# Patient Record
Sex: Female | Born: 1935 | Race: White | Hispanic: No | Marital: Married | Smoking: Never smoker
Health system: Southern US, Community
[De-identification: ages and names within clinical notes are randomized; demographics above are authoritative.]

## PROBLEM LIST (undated history)

## (undated) DIAGNOSIS — E669 Obesity, unspecified: Secondary | ICD-10-CM

## (undated) DIAGNOSIS — E039 Hypothyroidism, unspecified: Secondary | ICD-10-CM

## (undated) DIAGNOSIS — I2510 Atherosclerotic heart disease of native coronary artery without angina pectoris: Secondary | ICD-10-CM

## (undated) DIAGNOSIS — E7800 Pure hypercholesterolemia, unspecified: Secondary | ICD-10-CM

## (undated) DIAGNOSIS — C55 Malignant neoplasm of uterus, part unspecified: Secondary | ICD-10-CM

## (undated) DIAGNOSIS — I10 Essential (primary) hypertension: Secondary | ICD-10-CM

## (undated) DIAGNOSIS — K219 Gastro-esophageal reflux disease without esophagitis: Secondary | ICD-10-CM

## (undated) DIAGNOSIS — M1990 Unspecified osteoarthritis, unspecified site: Secondary | ICD-10-CM

## (undated) DIAGNOSIS — J90 Pleural effusion, not elsewhere classified: Secondary | ICD-10-CM

## (undated) DIAGNOSIS — K449 Diaphragmatic hernia without obstruction or gangrene: Secondary | ICD-10-CM

## (undated) DIAGNOSIS — D649 Anemia, unspecified: Secondary | ICD-10-CM

## (undated) DIAGNOSIS — E785 Hyperlipidemia, unspecified: Secondary | ICD-10-CM

## (undated) DIAGNOSIS — I82409 Acute embolism and thrombosis of unspecified deep veins of unspecified lower extremity: Secondary | ICD-10-CM

## (undated) HISTORY — DX: Anemia, unspecified: D64.9

## (undated) HISTORY — DX: Pure hypercholesterolemia, unspecified: E78.00

## (undated) HISTORY — DX: Gastro-esophageal reflux disease without esophagitis: K21.9

## (undated) HISTORY — PX: CATARACT EXTRACTION, BILATERAL: SHX1313

## (undated) HISTORY — PX: JOINT REPLACEMENT: SHX530

## (undated) HISTORY — DX: Acute embolism and thrombosis of unspecified deep veins of unspecified lower extremity: I82.409

## (undated) HISTORY — PX: EYE SURGERY: SHX253

## (undated) HISTORY — DX: Hyperlipidemia, unspecified: E78.5

## (undated) HISTORY — DX: Hypothyroidism, unspecified: E03.9

## (undated) HISTORY — DX: Unspecified osteoarthritis, unspecified site: M19.90

## (undated) HISTORY — PX: ABDOMINAL HYSTERECTOMY: SHX81

## (undated) HISTORY — DX: Malignant neoplasm of uterus, part unspecified: C55

## (undated) HISTORY — DX: Diaphragmatic hernia without obstruction or gangrene: K44.9

## (undated) HISTORY — PX: OTHER SURGICAL HISTORY: SHX169

## (undated) HISTORY — DX: Obesity, unspecified: E66.9

## (undated) HISTORY — PX: CORONARY ARTERY BYPASS GRAFT: SHX141

## (undated) HISTORY — DX: Atherosclerotic heart disease of native coronary artery without angina pectoris: I25.10

## (undated) HISTORY — DX: Pleural effusion, not elsewhere classified: J90

## (undated) HISTORY — DX: Essential (primary) hypertension: I10

---

## 1997-05-23 ENCOUNTER — Other Ambulatory Visit: Admission: RE | Admit: 1997-05-23 | Discharge: 1997-05-23 | Payer: Self-pay | Admitting: Obstetrics and Gynecology

## 1997-07-05 ENCOUNTER — Encounter: Admission: RE | Admit: 1997-07-05 | Discharge: 1997-10-03 | Payer: Self-pay | Admitting: Internal Medicine

## 1997-10-18 ENCOUNTER — Ambulatory Visit (HOSPITAL_BASED_OUTPATIENT_CLINIC_OR_DEPARTMENT_OTHER): Admission: RE | Admit: 1997-10-18 | Discharge: 1997-10-18 | Payer: Self-pay | Admitting: *Deleted

## 1998-01-07 HISTORY — PX: OTHER SURGICAL HISTORY: SHX169

## 1998-11-21 ENCOUNTER — Ambulatory Visit (HOSPITAL_COMMUNITY): Admission: RE | Admit: 1998-11-21 | Discharge: 1998-11-21 | Payer: Self-pay | Admitting: Obstetrics and Gynecology

## 1998-11-21 ENCOUNTER — Encounter (INDEPENDENT_AMBULATORY_CARE_PROVIDER_SITE_OTHER): Payer: Self-pay | Admitting: Specialist

## 1998-12-08 ENCOUNTER — Encounter: Payer: Self-pay | Admitting: Gynecology

## 1998-12-12 ENCOUNTER — Encounter (INDEPENDENT_AMBULATORY_CARE_PROVIDER_SITE_OTHER): Payer: Self-pay

## 1998-12-12 ENCOUNTER — Inpatient Hospital Stay (HOSPITAL_COMMUNITY): Admission: RE | Admit: 1998-12-12 | Discharge: 1998-12-15 | Payer: Self-pay | Admitting: Gynecology

## 1999-08-24 ENCOUNTER — Other Ambulatory Visit: Admission: RE | Admit: 1999-08-24 | Discharge: 1999-08-24 | Payer: Self-pay | Admitting: Gynecology

## 2000-01-04 ENCOUNTER — Other Ambulatory Visit: Admission: RE | Admit: 2000-01-04 | Discharge: 2000-01-04 | Payer: Self-pay | Admitting: Gynecology

## 2000-02-19 ENCOUNTER — Other Ambulatory Visit: Admission: RE | Admit: 2000-02-19 | Discharge: 2000-02-19 | Payer: Self-pay | Admitting: Gynecology

## 2000-05-13 ENCOUNTER — Other Ambulatory Visit: Admission: RE | Admit: 2000-05-13 | Discharge: 2000-05-13 | Payer: Self-pay | Admitting: Gynecology

## 2000-06-13 ENCOUNTER — Encounter: Payer: Self-pay | Admitting: Gynecology

## 2000-06-13 ENCOUNTER — Encounter: Admission: RE | Admit: 2000-06-13 | Discharge: 2000-06-13 | Payer: Self-pay | Admitting: Gynecology

## 2000-07-28 ENCOUNTER — Other Ambulatory Visit: Admission: RE | Admit: 2000-07-28 | Discharge: 2000-07-28 | Payer: Self-pay | Admitting: Gynecology

## 2000-10-21 ENCOUNTER — Other Ambulatory Visit: Admission: RE | Admit: 2000-10-21 | Discharge: 2000-10-21 | Payer: Self-pay | Admitting: Gynecology

## 2000-12-24 ENCOUNTER — Other Ambulatory Visit: Admission: RE | Admit: 2000-12-24 | Discharge: 2000-12-24 | Payer: Self-pay | Admitting: Gynecology

## 2001-02-16 ENCOUNTER — Ambulatory Visit (HOSPITAL_BASED_OUTPATIENT_CLINIC_OR_DEPARTMENT_OTHER): Admission: RE | Admit: 2001-02-16 | Discharge: 2001-02-16 | Payer: Self-pay | Admitting: Podiatry

## 2001-04-07 ENCOUNTER — Encounter (INDEPENDENT_AMBULATORY_CARE_PROVIDER_SITE_OTHER): Payer: Self-pay | Admitting: *Deleted

## 2001-04-07 ENCOUNTER — Ambulatory Visit (HOSPITAL_BASED_OUTPATIENT_CLINIC_OR_DEPARTMENT_OTHER): Admission: RE | Admit: 2001-04-07 | Discharge: 2001-04-07 | Payer: Self-pay | Admitting: Orthopedic Surgery

## 2001-04-13 ENCOUNTER — Other Ambulatory Visit: Admission: RE | Admit: 2001-04-13 | Discharge: 2001-04-13 | Payer: Self-pay | Admitting: Gynecology

## 2001-08-07 ENCOUNTER — Other Ambulatory Visit: Admission: RE | Admit: 2001-08-07 | Discharge: 2001-08-07 | Payer: Self-pay | Admitting: Gynecology

## 2001-12-11 ENCOUNTER — Other Ambulatory Visit: Admission: RE | Admit: 2001-12-11 | Discharge: 2001-12-11 | Payer: Self-pay | Admitting: Gynecology

## 2002-06-11 ENCOUNTER — Other Ambulatory Visit: Admission: RE | Admit: 2002-06-11 | Discharge: 2002-06-11 | Payer: Self-pay | Admitting: Gynecology

## 2002-12-10 ENCOUNTER — Other Ambulatory Visit: Admission: RE | Admit: 2002-12-10 | Discharge: 2002-12-10 | Payer: Self-pay | Admitting: Gynecology

## 2003-06-15 ENCOUNTER — Other Ambulatory Visit: Admission: RE | Admit: 2003-06-15 | Discharge: 2003-06-15 | Payer: Self-pay | Admitting: Gynecology

## 2003-12-15 ENCOUNTER — Other Ambulatory Visit: Admission: RE | Admit: 2003-12-15 | Discharge: 2003-12-15 | Payer: Self-pay | Admitting: Gynecology

## 2004-04-12 ENCOUNTER — Encounter: Admission: RE | Admit: 2004-04-12 | Discharge: 2004-04-12 | Payer: Self-pay | Admitting: Gynecology

## 2004-12-14 ENCOUNTER — Other Ambulatory Visit: Admission: RE | Admit: 2004-12-14 | Discharge: 2004-12-14 | Payer: Self-pay | Admitting: Gynecology

## 2005-08-15 ENCOUNTER — Encounter (INDEPENDENT_AMBULATORY_CARE_PROVIDER_SITE_OTHER): Payer: Self-pay | Admitting: Specialist

## 2005-08-15 ENCOUNTER — Ambulatory Visit (HOSPITAL_BASED_OUTPATIENT_CLINIC_OR_DEPARTMENT_OTHER): Admission: RE | Admit: 2005-08-15 | Discharge: 2005-08-15 | Payer: Self-pay | Admitting: Orthopedic Surgery

## 2005-10-09 ENCOUNTER — Ambulatory Visit (HOSPITAL_BASED_OUTPATIENT_CLINIC_OR_DEPARTMENT_OTHER): Admission: RE | Admit: 2005-10-09 | Discharge: 2005-10-09 | Payer: Self-pay | Admitting: Orthopedic Surgery

## 2005-12-24 ENCOUNTER — Other Ambulatory Visit: Admission: RE | Admit: 2005-12-24 | Discharge: 2005-12-24 | Payer: Self-pay | Admitting: Gynecology

## 2006-11-13 ENCOUNTER — Inpatient Hospital Stay (HOSPITAL_COMMUNITY): Admission: EM | Admit: 2006-11-13 | Discharge: 2006-11-22 | Payer: Self-pay | Admitting: Emergency Medicine

## 2006-11-13 ENCOUNTER — Encounter: Payer: Self-pay | Admitting: Surgery

## 2006-11-13 ENCOUNTER — Ambulatory Visit: Payer: Self-pay | Admitting: Cardiovascular Disease

## 2006-11-13 ENCOUNTER — Ambulatory Visit: Payer: Self-pay | Admitting: Surgery

## 2006-12-09 ENCOUNTER — Ambulatory Visit: Payer: Self-pay | Admitting: Cardiovascular Disease

## 2006-12-12 ENCOUNTER — Ambulatory Visit: Payer: Self-pay | Admitting: Cardiothoracic Surgery

## 2006-12-18 ENCOUNTER — Encounter (HOSPITAL_COMMUNITY): Admission: RE | Admit: 2006-12-18 | Discharge: 2007-01-07 | Payer: Self-pay | Admitting: Cardiovascular Disease

## 2006-12-30 ENCOUNTER — Other Ambulatory Visit: Admission: RE | Admit: 2006-12-30 | Discharge: 2006-12-30 | Payer: Self-pay | Admitting: Gynecology

## 2007-01-08 ENCOUNTER — Encounter (HOSPITAL_COMMUNITY): Admission: RE | Admit: 2007-01-08 | Discharge: 2007-02-20 | Payer: Self-pay | Admitting: Cardiovascular Disease

## 2007-03-31 ENCOUNTER — Ambulatory Visit: Payer: Self-pay | Admitting: Cardiovascular Disease

## 2007-04-03 ENCOUNTER — Ambulatory Visit: Payer: Self-pay | Admitting: Cardiology

## 2007-05-15 ENCOUNTER — Ambulatory Visit: Payer: Self-pay | Admitting: Cardiovascular Disease

## 2007-05-18 ENCOUNTER — Ambulatory Visit: Payer: Self-pay

## 2007-06-08 ENCOUNTER — Ambulatory Visit: Payer: Self-pay | Admitting: Cardiovascular Disease

## 2007-06-11 ENCOUNTER — Ambulatory Visit: Payer: Self-pay | Admitting: Internal Medicine

## 2007-12-22 ENCOUNTER — Ambulatory Visit (HOSPITAL_BASED_OUTPATIENT_CLINIC_OR_DEPARTMENT_OTHER): Admission: RE | Admit: 2007-12-22 | Discharge: 2007-12-22 | Payer: Self-pay | Admitting: Orthopedic Surgery

## 2007-12-22 ENCOUNTER — Encounter (INDEPENDENT_AMBULATORY_CARE_PROVIDER_SITE_OTHER): Payer: Self-pay | Admitting: Orthopedic Surgery

## 2007-12-29 ENCOUNTER — Ambulatory Visit: Payer: Self-pay | Admitting: Cardiovascular Disease

## 2008-07-02 DIAGNOSIS — E7800 Pure hypercholesterolemia, unspecified: Secondary | ICD-10-CM | POA: Insufficient documentation

## 2008-07-02 DIAGNOSIS — J90 Pleural effusion, not elsewhere classified: Secondary | ICD-10-CM | POA: Insufficient documentation

## 2008-07-02 DIAGNOSIS — K449 Diaphragmatic hernia without obstruction or gangrene: Secondary | ICD-10-CM | POA: Insufficient documentation

## 2008-07-02 DIAGNOSIS — I2510 Atherosclerotic heart disease of native coronary artery without angina pectoris: Secondary | ICD-10-CM | POA: Insufficient documentation

## 2008-07-02 DIAGNOSIS — I10 Essential (primary) hypertension: Secondary | ICD-10-CM | POA: Insufficient documentation

## 2008-07-02 DIAGNOSIS — D649 Anemia, unspecified: Secondary | ICD-10-CM | POA: Insufficient documentation

## 2008-07-02 DIAGNOSIS — Z8542 Personal history of malignant neoplasm of other parts of uterus: Secondary | ICD-10-CM

## 2008-07-02 DIAGNOSIS — E669 Obesity, unspecified: Secondary | ICD-10-CM | POA: Insufficient documentation

## 2008-07-02 DIAGNOSIS — E119 Type 2 diabetes mellitus without complications: Secondary | ICD-10-CM | POA: Insufficient documentation

## 2008-07-02 DIAGNOSIS — E039 Hypothyroidism, unspecified: Secondary | ICD-10-CM

## 2008-07-02 DIAGNOSIS — K219 Gastro-esophageal reflux disease without esophagitis: Secondary | ICD-10-CM | POA: Insufficient documentation

## 2008-07-02 DIAGNOSIS — E785 Hyperlipidemia, unspecified: Secondary | ICD-10-CM | POA: Insufficient documentation

## 2008-07-02 DIAGNOSIS — Z8540 Personal history of malignant neoplasm of unspecified female genital organ: Secondary | ICD-10-CM | POA: Insufficient documentation

## 2008-07-02 DIAGNOSIS — R079 Chest pain, unspecified: Secondary | ICD-10-CM

## 2008-07-07 ENCOUNTER — Ambulatory Visit: Payer: Self-pay | Admitting: Cardiovascular Disease

## 2008-07-15 DIAGNOSIS — M899 Disorder of bone, unspecified: Secondary | ICD-10-CM | POA: Insufficient documentation

## 2008-10-13 IMAGING — CR DG CHEST 2V
2 series · 2 of 2 positions shown · non-contrast
Comparison: 11/14/06.

CLINICAL DATA: Myocardial infarction.  Preop respiratory exam for coronary artery bypass grafting.
 CHEST ? 2 VIEW:

[w chest pa]
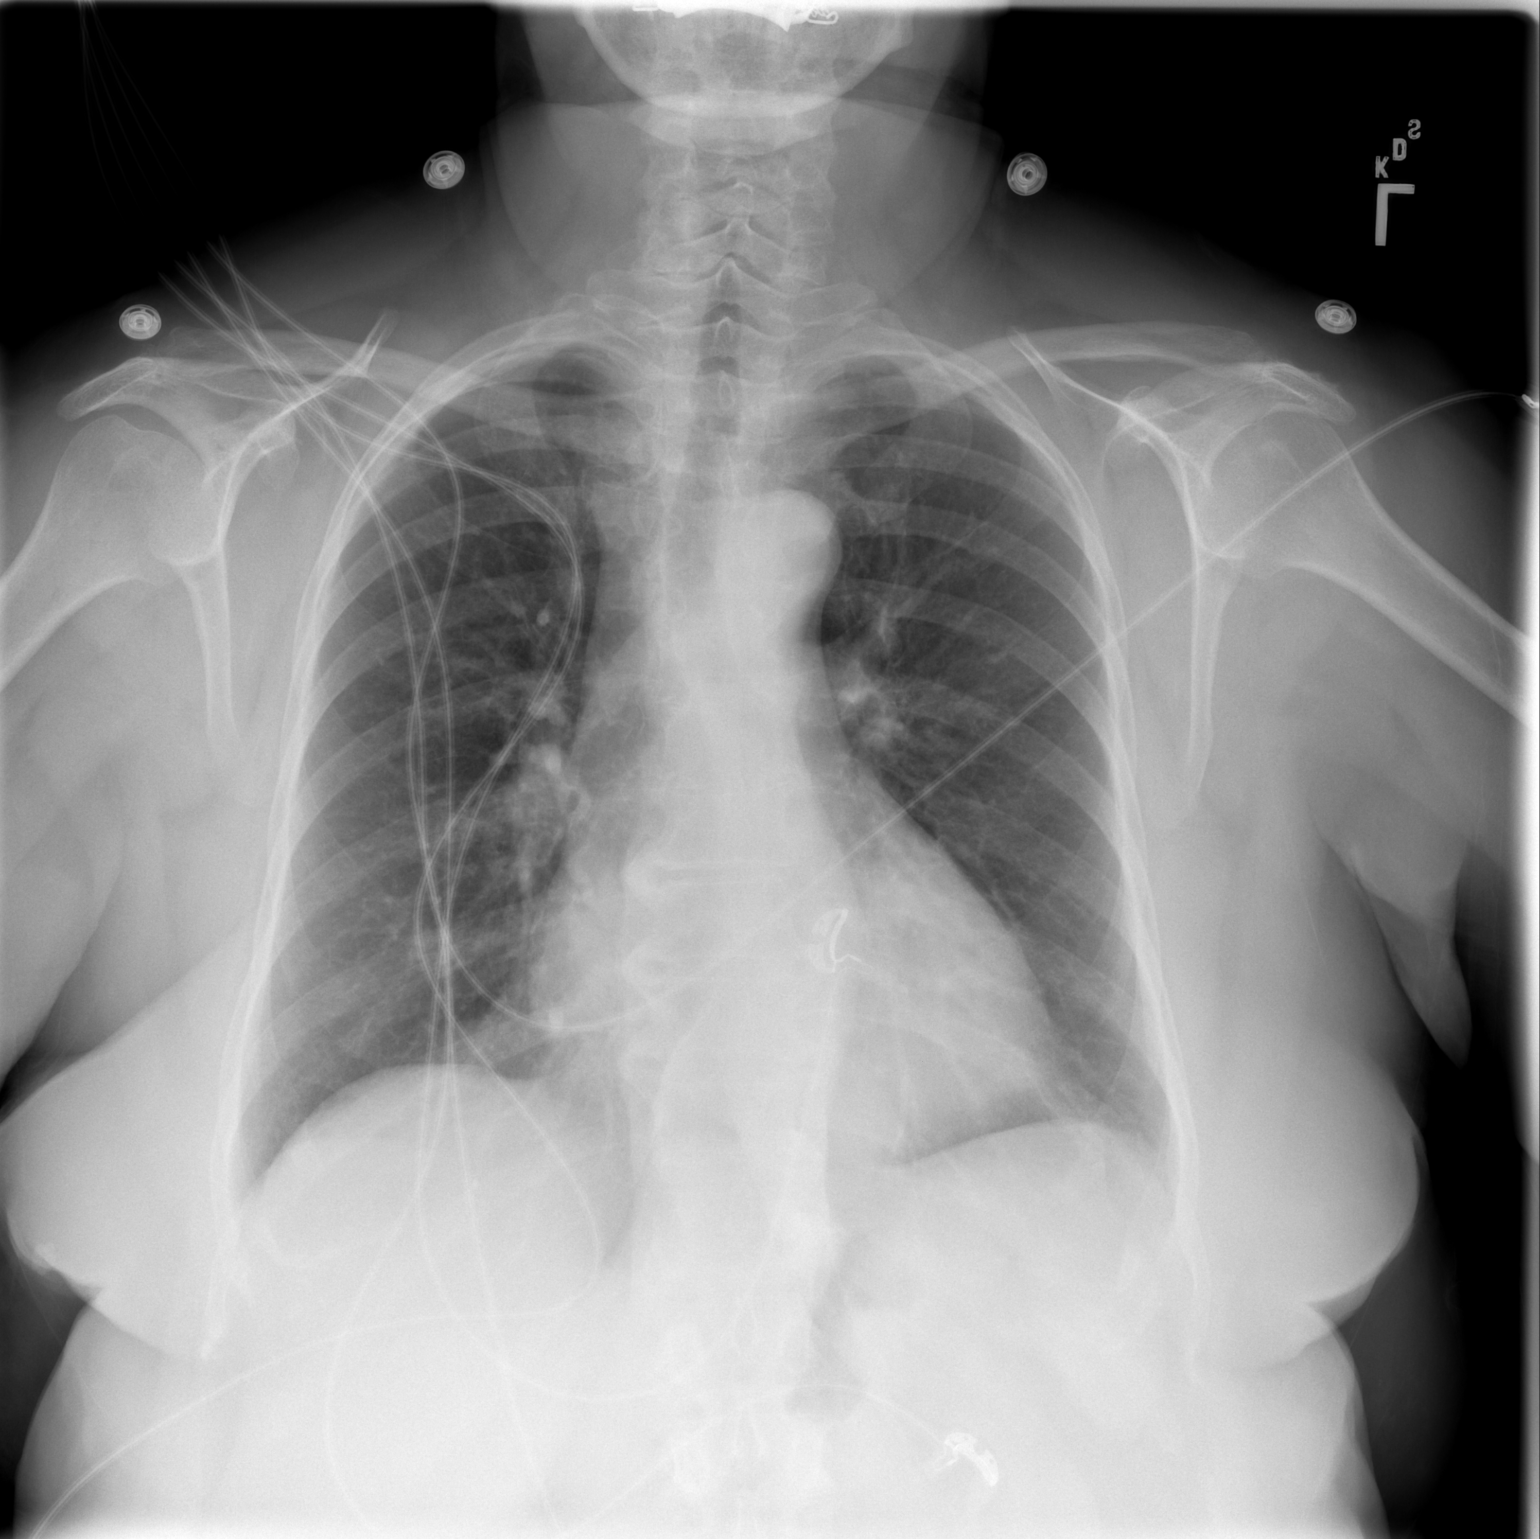

[w chest lat]
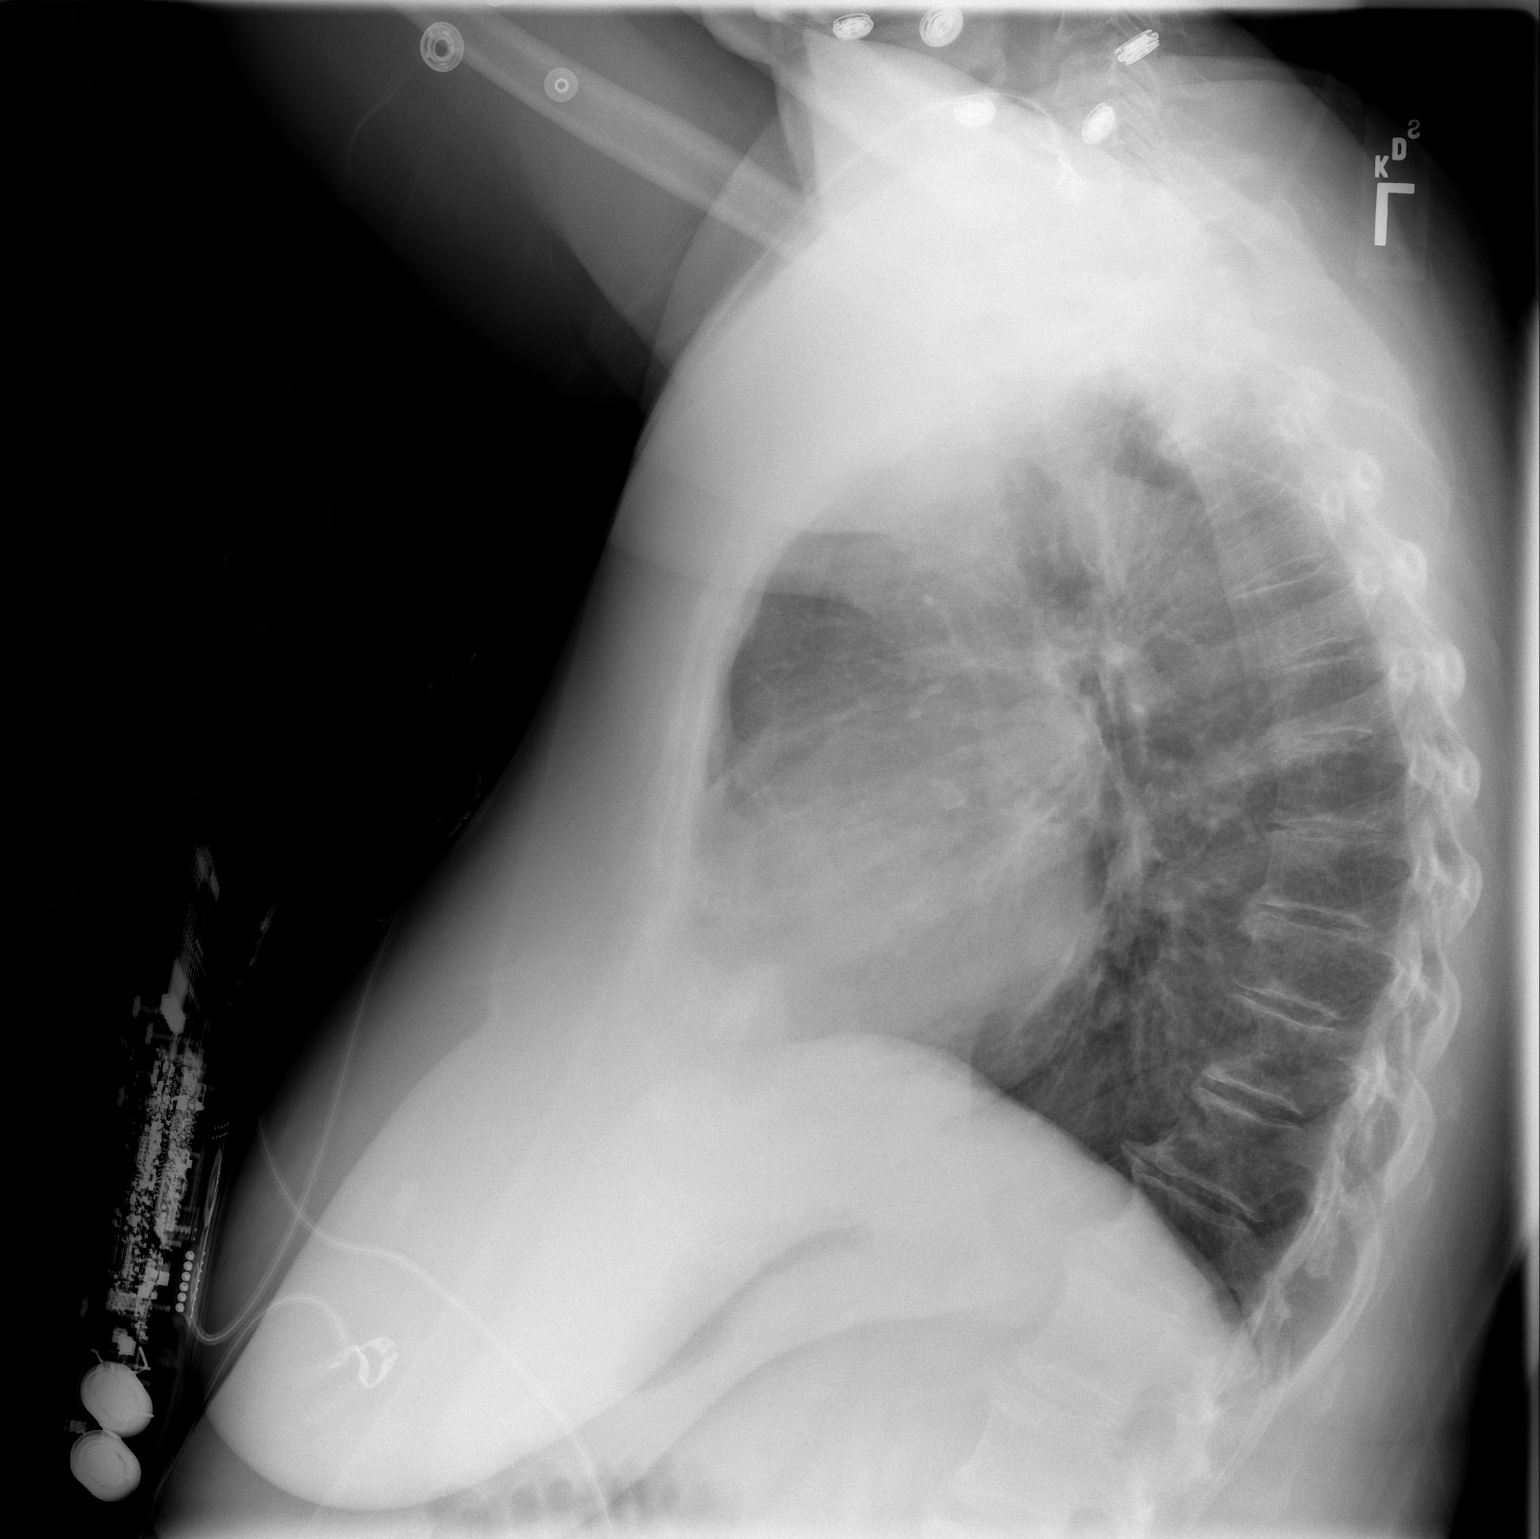

[2 of 2 positions shown; findings below may reference images not displayed]

FINDINGS: Improved aeration is seen with resolution of left basilar atelectasis.  Heart size remains at the upper limits of normal.  Tortuosity of the thoracic aorta is stable.  Both lungs are clear.  No mass or adenopathy is identified.
IMPRESSION: Stable borderline cardiomegaly.  No active disease.

## 2008-10-15 IMAGING — CR DG CHEST 1V PORT
1 series · 1 of 1 positions shown · non-contrast
Comparison: 11/15/2006.

CLINICAL DATA: Status post CABG.

PORTABLE CHEST - 1 VIEW

[view not recorded]
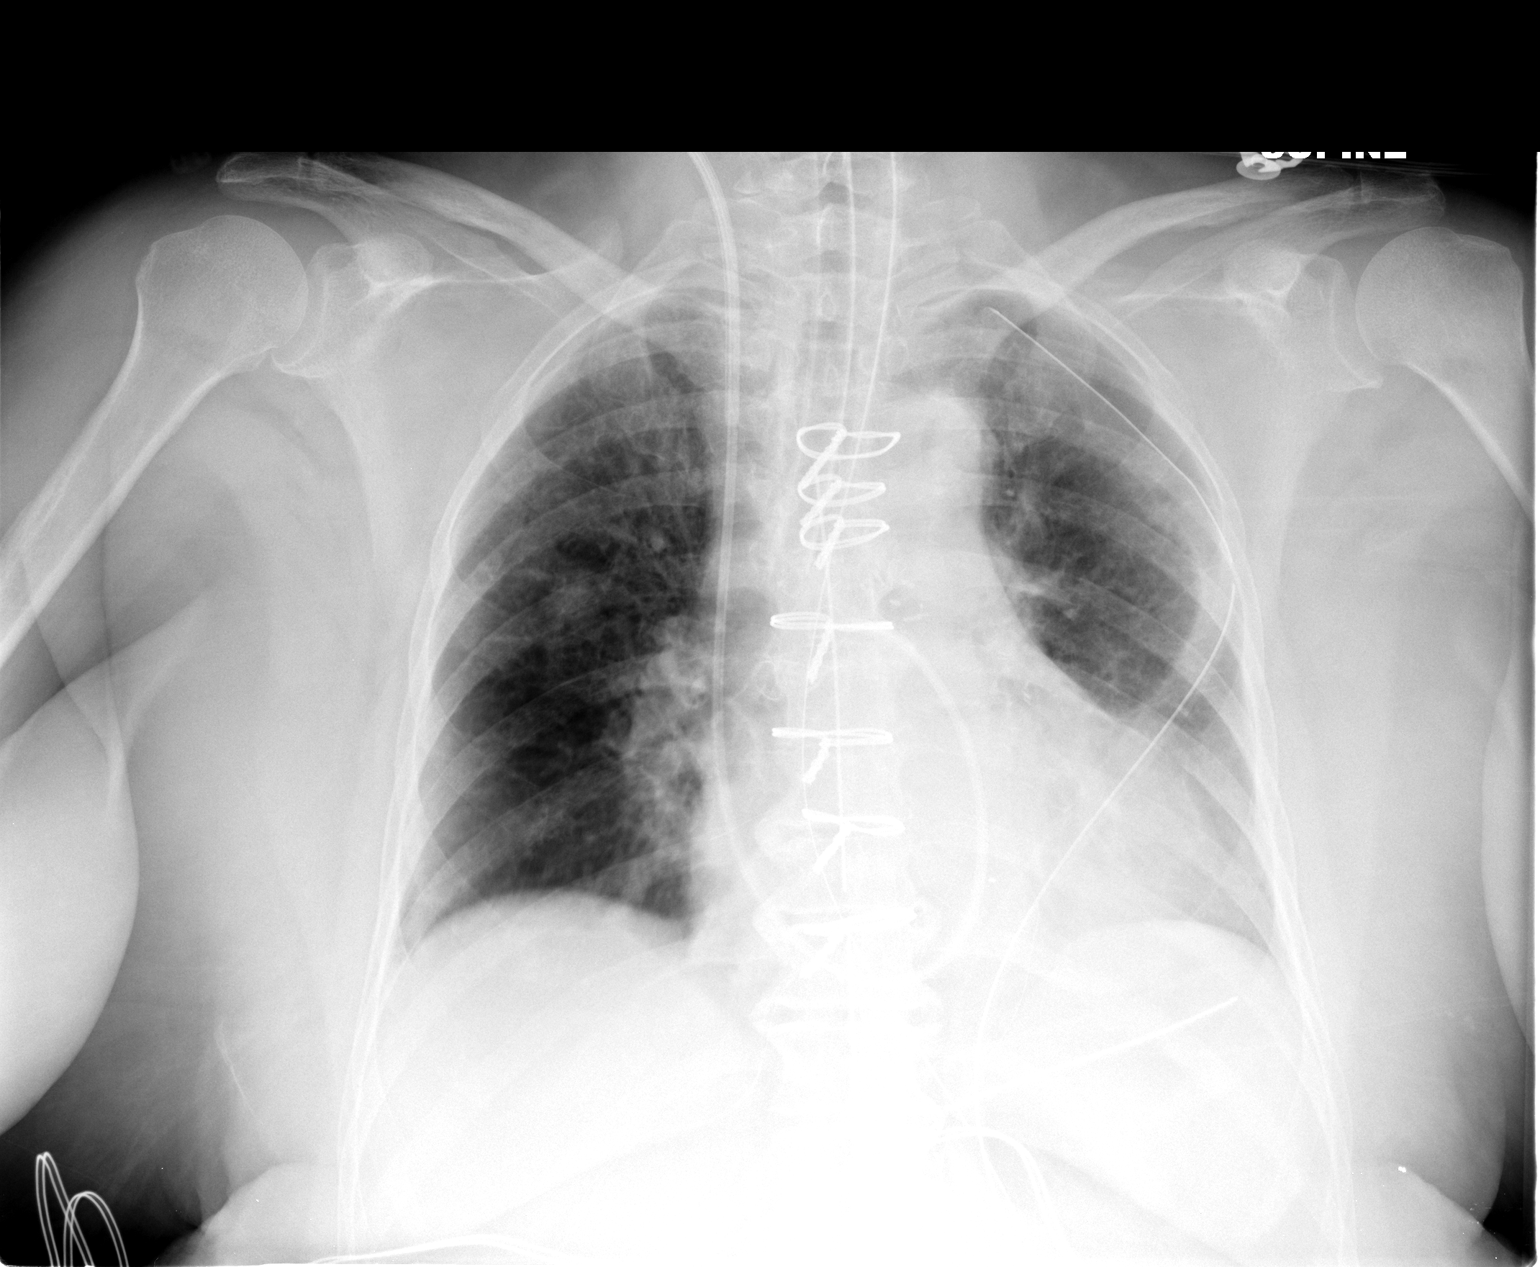

[1 of 1 positions shown; findings below may reference images not displayed]

FINDINGS: Interval post CABG changes. The endotracheal tube tip is in the
proximal right mainstem bronchus. Mediastinal and left chest tubes without
pneumothorax. Mildly enlarged cardiac silhouette. Diffuse peribronchial
thickening. Right jugular Swan-Ganz catheter tip in the proximal right main
pulmonary artery. Chest tube extending into the stomach. Thoracic spine
degenerative changes.  

IMPRESSION

1. The endotracheal tube tip is in the proximal right mainstem bronchus. It is
recommended that this be retracted 4.5 cm.

2. Cardiomegaly and chronic bronchitic changes.

## 2008-10-16 IMAGING — CR DG CHEST 1V PORT
1 series · 1 of 1 positions shown · non-contrast
Comparison: 11/17/06.

CLINICAL DATA: Status post CABG. 
 PORTABLE CHEST ? 1 VIEW:

[view not recorded]
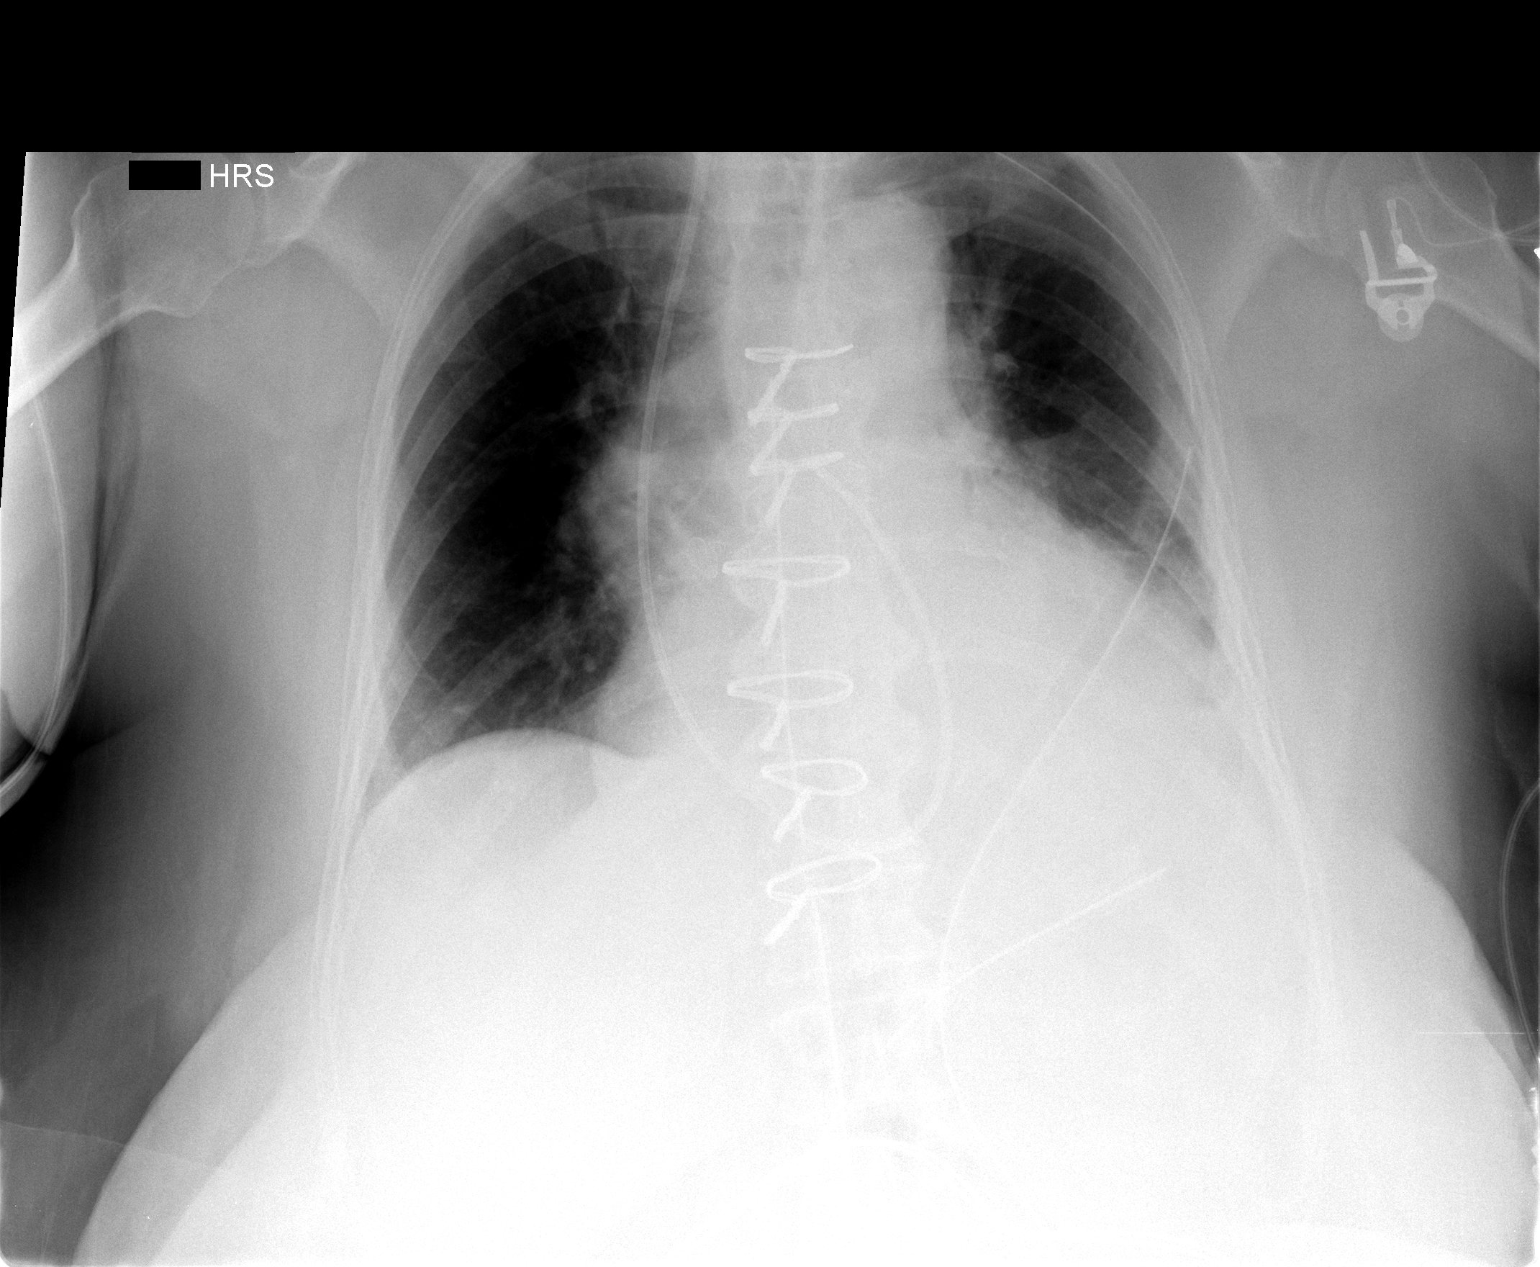

[1 of 1 positions shown; findings below may reference images not displayed]

FINDINGS: There are two left-sided chest tubes.  No pneumothorax is identified. Swan-Ganz catheter tip is in the main pulmonary artery.  Mediastinal drain is in place.  Cardiac enlargement.  There is decreased aeration to the left base and probable left pleural effusion.  The right lung is clear.
IMPRESSION: 1. New left pleural effusion. 
 2. Left lower lobe atelectasis and/or consolidation.

## 2008-10-17 IMAGING — CR DG CHEST 1V PORT
1 series · 1 of 1 positions shown · non-contrast
Comparison: 11/18/06 at [DATE] a.m.

CLINICAL DATA: Status-post CABG.  Followup. 
PORTABLE CHEST ? 1 VIEW ([DATE] A.M.):

[view not recorded]
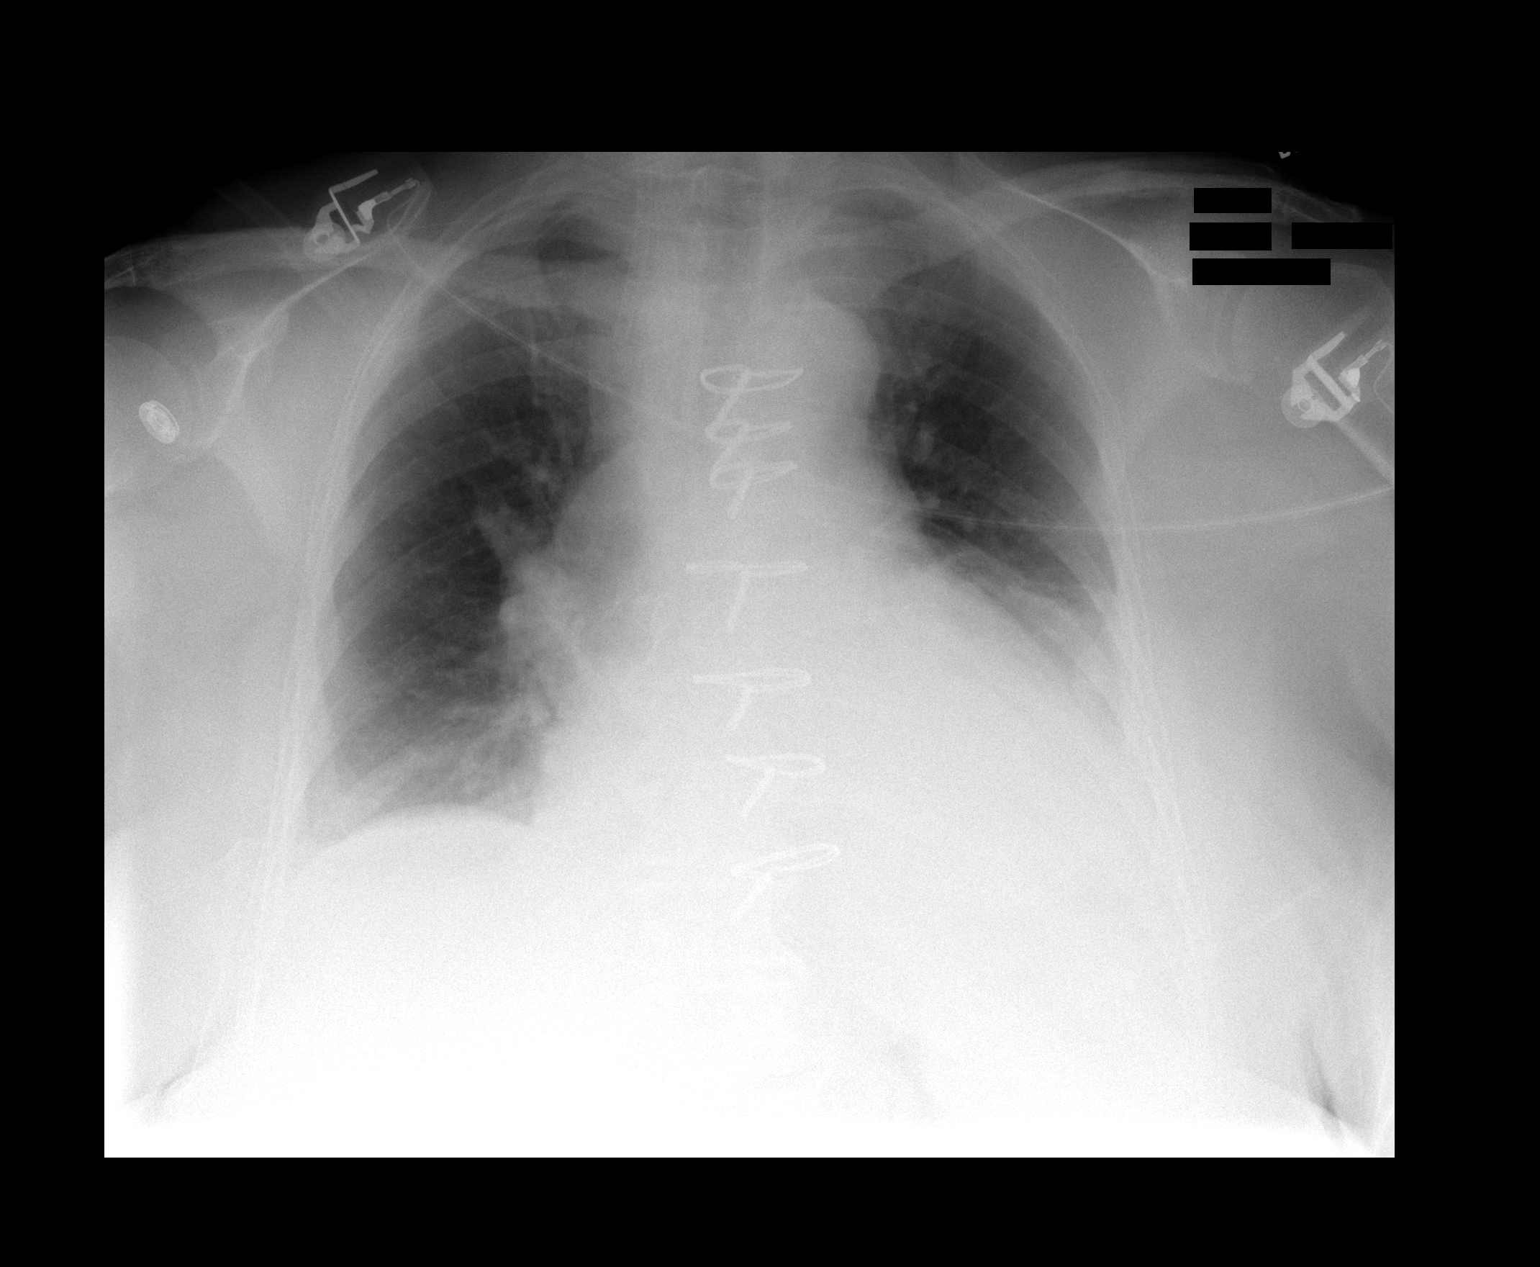

[1 of 1 positions shown; findings below may reference images not displayed]

FINDINGS: Swan-Ganz catheter has been removed.  The introducer catheter tip is at the level of the proximal superior vena cava.  There is a kink at the skin site level.  Left-sided chest tube has been removed without evidence of a pneumothorax.  Mediastinal drains removed.  Cardiomegaly.  Persistent opacification left base suggestive of atelectasis and/or pleural effusion.  Tortuous aorta.  Central pulmonary vascular prominence.
IMPRESSION: 1.  Swan-Ganz catheter, left-sided chest tube and mediastinal drains have been removed.  No pneumothorax.  
2.  Cardiomegaly and central pulmonary vascular prominence unchanged.  
3.  No significant change left lower lobe consolidation which may represent atelectasis and/or pleural effusion.

## 2008-12-20 DIAGNOSIS — M1990 Unspecified osteoarthritis, unspecified site: Secondary | ICD-10-CM | POA: Insufficient documentation

## 2008-12-20 DIAGNOSIS — R5383 Other fatigue: Secondary | ICD-10-CM | POA: Insufficient documentation

## 2008-12-26 ENCOUNTER — Encounter: Admission: RE | Admit: 2008-12-26 | Discharge: 2008-12-26 | Payer: Self-pay | Admitting: Internal Medicine

## 2009-01-11 ENCOUNTER — Ambulatory Visit: Payer: Self-pay | Admitting: Cardiovascular Disease

## 2009-01-11 DIAGNOSIS — M1710 Unilateral primary osteoarthritis, unspecified knee: Secondary | ICD-10-CM

## 2009-01-11 DIAGNOSIS — M179 Osteoarthritis of knee, unspecified: Secondary | ICD-10-CM | POA: Diagnosis present

## 2009-01-11 DIAGNOSIS — J40 Bronchitis, not specified as acute or chronic: Secondary | ICD-10-CM | POA: Insufficient documentation

## 2009-01-11 DIAGNOSIS — R05 Cough: Secondary | ICD-10-CM

## 2009-01-26 ENCOUNTER — Ambulatory Visit: Payer: Self-pay | Admitting: Pulmonary Disease

## 2009-02-08 ENCOUNTER — Ambulatory Visit: Payer: Self-pay | Admitting: Pulmonary Disease

## 2009-02-23 ENCOUNTER — Ambulatory Visit: Payer: Self-pay | Admitting: Cardiovascular Disease

## 2009-02-24 LAB — CONVERTED CEMR LAB
BUN: 14 mg/dL (ref 6–23)
CO2: 29 meq/L (ref 19–32)
Chloride: 108 meq/L (ref 96–112)
Creatinine, Ser: 0.8 mg/dL (ref 0.4–1.2)
GFR calc non Af Amer: 74.4 mL/min (ref >60)
Glucose, Bld: 234 mg/dL — ABNORMAL HIGH (ref 70–99)
Potassium: 4.4 meq/L (ref 3.5–5.1)

## 2009-03-27 ENCOUNTER — Encounter: Payer: Self-pay | Admitting: Cardiovascular Disease

## 2009-03-28 ENCOUNTER — Telehealth: Payer: Self-pay | Admitting: Cardiovascular Disease

## 2009-03-30 ENCOUNTER — Inpatient Hospital Stay (HOSPITAL_COMMUNITY): Admission: RE | Admit: 2009-03-30 | Discharge: 2009-04-03 | Payer: Self-pay | Admitting: Neurosurgery

## 2009-04-06 ENCOUNTER — Encounter: Payer: Self-pay | Admitting: Cardiovascular Disease

## 2009-05-25 ENCOUNTER — Encounter (INDEPENDENT_AMBULATORY_CARE_PROVIDER_SITE_OTHER): Payer: Self-pay | Admitting: *Deleted

## 2009-05-31 ENCOUNTER — Telehealth: Payer: Self-pay | Admitting: Pulmonary Disease

## 2009-07-20 ENCOUNTER — Ambulatory Visit: Payer: Self-pay | Admitting: Cardiovascular Disease

## 2009-08-04 ENCOUNTER — Encounter: Payer: Self-pay | Admitting: Cardiovascular Disease

## 2009-09-06 DIAGNOSIS — H9190 Unspecified hearing loss, unspecified ear: Secondary | ICD-10-CM | POA: Insufficient documentation

## 2009-10-04 ENCOUNTER — Encounter: Payer: Self-pay | Admitting: Cardiovascular Disease

## 2009-10-13 DIAGNOSIS — Z2839 Other underimmunization status: Secondary | ICD-10-CM | POA: Insufficient documentation

## 2009-12-20 ENCOUNTER — Encounter: Payer: Self-pay | Admitting: Cardiovascular Disease

## 2010-01-17 ENCOUNTER — Ambulatory Visit
Admission: RE | Admit: 2010-01-17 | Discharge: 2010-01-17 | Payer: Self-pay | Source: Home / Self Care | Attending: Cardiovascular Disease | Admitting: Cardiovascular Disease

## 2010-01-28 ENCOUNTER — Encounter: Payer: Self-pay | Admitting: Cardiothoracic Surgery

## 2010-02-08 NOTE — Assessment & Plan Note (Signed)
Summary: rov for chronic cough   Copy to:  Charlton Haws Primary Provider/Referring Provider:  Dr Creola Corn  CC:  Pt is here for a 2 week f/u appt.  Pt states cough is 80% improved.  Pt states occ non-productive cough but will get " white sputum in back of throat and will have to cough it up."  .  History of Present Illness: The pt comes in today for f/u of her chronic cough.  At the last visit, this was felt to be from her upper airway, and she was treated for PND, LPR, and cyclical cough mechanism.  She states that her cough is 80% improved, and has not had the paroxysms as in the past.  She is still getting occasional mucus in her throat that sounds more like PND than anything else, and is white in color.  She denies chest congestion, rattle, or sob.  Current Medications (verified): 1)  Tricor 145 Mg Tabs (Fenofibrate) .[name].. 1 Tab By Mouth Once Daily 2)  Metoprolol Succinate 25 Mg Xr24h-Tab (Metoprolol Succinate) .... Take One Tablet By Mouth Daily 3)  Metformin Hcl 500 Mg Tabs (Metformin Hcl) .[name].. 1 Tab Two Times A Day 4)  Synthroid 112 Mcg Tabs (Levothyroxine Sodium) .[name].. 1 Tab By Mouth Once Daily  Except 1 1/2 Tabs By Mouth On Sundays 5)  Aspirin Ec 325 Mg Tbec (Aspirin) .... Take One Tablet By Mouth Daily 6)  Citracal + D3 .... 3 Tabs Once Daily 7)  Vitamin D 3 .... 2000mg 1 Tab Once Daily 8)  Cozaar 25 Mg Tabs (Losartan Potassium) .... 1 Once Daily 9)  Omeprazole 20 Mg Cpdr (Omeprazole) .... Take 1 Tablet By Mouth Once A Day 10)  Chlorpheniramine Maleate 4 Mg Tabs (Chlorpheniramine Maleate) .... Take 2 Tabs By Mouth At Bedtime 11)  Nasonex 50 Mcg/act Susp (Mometasone Furoate) .... 2 Sprays in Each Nostril Once Daily 12)  Aciphex 20 Mg Tbec (Rabeprazole Sodium) .... Take 1 Tablet By Mouth Two Times A Day  Allergies (verified): 1)  ! Adhesive Tape  Review of Systems      See HPI  Vital Signs:  Patient profile:   77 year old female Height:      61 inches Weight:      186  pounds O2 Sat:      96 % on Room air Temp:     98 .0 degrees F oral Pulse rate:   69 / minute BP sitting:   134 / 84  (left arm) Cuff size:   large  Vitals Entered By: Arman Filter LPN (February 08, 2009 10:04 AM)  O2 Flow:  Room air CC: Pt is here for a 2 week f/u appt.  Pt states cough is 80% improved.  Pt states occ non-productive cough but will get " white sputum in back of throat and will have to cough it up."   Comments Medications reviewed with patient Arman Filter LPN  February 08, 2009 10:07 AM    Physical Exam  General:  ow female in nad Nose:  no drainage noted Lungs:  clear to auscultation Heart:  rrr, no mrg Extremities:  no cyanosis Neurologic:  alert and oriented, moves all a 4.   Impression & Recommendations:  Problem # 1:  COUGH (ICD-786.2)  the pt's cough is much improved with aggressive treatment of postnasal drip, LPR, and cyclical cough mechanism.  The difficult part to determine is how long we continue the treatment plan, and what medications to take away  first.  Often times, the pt can return to her usual medical regimen with no new chronic medications.  Sometimes however, she will need ongoing treatment to prevent irritation to upper airway.  At this point, would continue aggressive treatment for another 4 weeks since her cough has not totally resolved.  At that point, will begin to withdraw meds and see how she does.  Medications Added to Medication List This Visit: 1)  Chlorpheniramine Maleate 4 Mg Tabs (Chlorpheniramine maleate) .... Take 2 tabs by mouth at bedtime 2)  Nasonex 50 Mcg/act Susp (Mometasone furoate) .... 2 sprays in each nostril once daily 3)  Aciphex 20 Mg Tbec (Rabeprazole sodium) .... Take 1 tablet by mouth two times a day  Other Orders: Est. Patient Level III ([address])  Patient Instructions: 1)  continue aciphex twice a day, along with nasal spray and chlorpheniramine for next 4weeks. 2)  after the above, would change allergy  medication to as needed, stop nasal spray, and decrease aciphex to once a day 3)  continue with the various behavioral techniques we discussed at the last visit.   4)  followup with me if the cough doesn't resolve.   Prescriptions: NASONEX 50 MCG/ACT SUSP (MOMETASONE FUROATE) 2 sprays in each nostril once daily  #1 x 6   Entered and Authorized by:   Barbaraann Share MD   Signed by:   Barbaraann Share MD on 02/08/2009   Method used:   Print then Give to Patient   RxID:   [id number]    Immunization History:  Influenza Immunization History:    Influenza:  historical (11/07/2008)  Pneumovax Immunization History:    Pneumovax:  historical (11/07/2008)

## 2010-02-08 NOTE — Progress Notes (Signed)
Summary: knee replacement of thur/ call pt first   Phone Note Call from Patient Call back at Home Phone [phone]   Caller: Patient Reason for Call: Talk to Nurse Summary of Call: per pt calling, pt having knee replacement this thursday. paperwork that was sent was date from 2008. office need an update records . last time pt was in office. pt also need an note that it's o.k for pt to be put asleep. dr. supple office [phone]. pt would like for nurse to call her before calling dr. supple office. Initial call taken by: Lorne Skeens,  March 28, 2009 9:24 AM  Follow-up for Phone Call        spoke with pt, note to be faxed to dr supple Deliah Goody, RN  March 28, 2009 12:20 PM

## 2010-02-08 NOTE — Letter (Signed)
Summary: Appointment - Reminder 2  [facility], Main Office  [address]   [place], [place] [address]   Phone: [phone]  Fax: [phone]     May 25, 2009 MRN: [id number]   Diana Wood [address] [place], [place]  [address]   Dear Ms. Henricks,  Our records indicate that it is time to schedule a follow-up appointment with Dr. [name]. It is very important that we reach you to schedule this appointment. We look forward to participating in your health care needs. Please contact us at the number listed above at your earliest convenience to schedule your appointment.  If you are unable to make an appointment at this time, give [place] a call so we can update our records.     Sincerely,   [facility]ig[facility] Scheduling Team

## 2010-02-08 NOTE — Assessment & Plan Note (Signed)
Summary: 6 mo f/u ./cy  Medications Added LOSARTAN POTASSIUM 25 MG TABS (LOSARTAN POTASSIUM) 1  tab by mouth once daily      Allergies Added:   Referring Provider:  Charlton Haws Primary Provider:  Dr Creola Corn  CC:  pt complains of inner ear problems.  History of Present Illness: Diana Wood is seen today for F/U of her hypertension, hyperlipidemia and CAD.  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  She has normal LV fucnton.  she had a normal Myoview on May 18, 2007.  There was no ischemia with an ejection fraction of 75%.  She continues to have occasional atypical twinges of sharp pain in the left side of her chest.  She had these previously and had a nonischemic Myoview.  I think the neuropathic or from her sternotomy and clearly does not sound like her angina.  She continues to have dry eyes and may need a corneal transplant on the left eye.  Her eye doctor is in [place].  She had right TKR with Dr Rennis Chris since I last saw her with no complications  Current Problems (verified): 1)  Degenerative Joint Disease, Both Knees, Severe  (ICD-715.96) 2)  Bronchitis Not Specified As Acute or Chronic  (ICD-490) 3)  Cough  (ICD-786.2) 4)  Hyperlipidemia  (ICD-272.4) 5)  Hypertension  (ICD-401.9) 6)  Cad  (ICD-414.00) 7)  Chest Pain  (ICD-786.50) 8)  Anemia  (ICD-285.9) 9)  Hypothyroidism  (ICD-244.9) 10)  Hiatal Hernia  (ICD-553.3) 11)  Pleural Effusion  (ICD-511.9) 12)  Dm  (ICD-250.00) 13)  Hypercholesterolemia  (ICD-272.0) 14)  Gerd  (ICD-530.81) 15)  Uterine Cancer, Hx of  (ICD-V10.42) 16)  Obesity  (ICD-278.00)  Current Medications (verified): 1)  Tricor 145 Mg Tabs (Fenofibrate) .[name].. 1 Tab By Mouth Once Daily 2)  Metoprolol Succinate 50 Mg Xr24h-Tab (Metoprolol Succinate) .... Take One Tablet By Mouth Daily 3)  Metformin Hcl 500 Mg Tabs (Metformin Hcl) .[name].. 1 Tab Two Times A Day 4)  Synthroid 112 Mcg Tabs (Levothyroxine Sodium) .[name].. 1 Tab By Mouth Once Daily  Except 1 1/2 Tabs By Mouth On  Sundays 5)  Omeprazole 20 Mg Cpdr (Omeprazole) .... Take 1 Tablet By Mouth Once A Day 6)  Losartan Potassium 25 Mg Tabs (Losartan Potassium) .[name].. 1  Tab By Mouth Once Daily  Allergies (verified): 1)  ! Adhesive Tape  Past History:  Past Medical History: Last updated: 01/26/2009  HYPERLIPIDEMIA (ICD-272.4) HYPERTENSION (ICD-401.9) CAD (ICD-414.00) CHEST PAIN (ICD-786.50) ANEMIA (ICD-285.9) HYPOTHYROIDISM (ICD-244.9) HIATAL HERNIA (ICD-553.3) PLEURAL EFFUSION (ICD-511.9) DM (ICD-250.00) HYPERCHOLESTEROLEMIA (ICD-272.0) GERD (ICD-530.81) UTERINE CANCER, HX OF (ICD-V10.42) OBESITY (ICD-278.00)   Probable yeast-related rash under both breasts.  Urinary tract infection   treated.  right leg deep vein thrombosis  Past Surgical History: Last updated: 01/26/2009  coronary  artery bypass surgery. x4 using a free left internal mammary artery graft to the left anterior descending coronary artery, with a saphenous vein graft to the diagonal branch of the left anterior descending artery, a saphenous vein graft to the obtuse marginal branch of the left circumflex coronary artery, and   a saphenous vein graft to the posterior descending branch of the right coronary artery.   4. Endoscopic vein harvesting from both thighs.  ATTENDING SURGEON:  Evelene Croon, M.D.    Excision cyst debridement fusion distal interphalangeal  joint right ring finger with Mini Acutrak screw 20 mm in length.     Bilateral  knee surgeries.   Bilateral feet surgery  surgery on 2 fingers  on R hand.     Bilateral cataracts.   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  2000  Family History: Last updated: 01/26/2009  Both mother and father are deceased in their 42s. Her   mother is status post bypass surgery and never fully recovered however, she died from cancer (unsure what kind).  She had a history of diabetes.  Her father  died in his sleep with a history of  alcohol problems.  She has a brother  and sister that are both living. Her brother was hospitalized at one point for meningitis for approximately 6 months resulting in hearing   loss and partial blindness.      Social History: Last updated: 01/26/2009  She resides in [place] with her husband.   She has  three children, five grandchildren, five great grandchildren.   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  She denies any tobacco  history, alcohol, drug use, herbal medications.   She tries to maintain  an ADA diet.   She does not exercise secondary to her knee problems.  Review of Systems       Denies fever, malais, weight loss, blurry vision, decreased visual acuity, cough, sputum, SOB, hemoptysis, pleuritic pain, palpitaitons, heartburn, abdominal pain, melena, lower extremity edema, claudication, or rash.   Vital Signs:  Patient profile:   77 year old female Height:      61 inches Weight:      182 pounds BMI:     34.51 Pulse rate:   72 / minute Resp:     14 per minute BP sitting:   140 / 82  (left arm)  Vitals Entered By: Kem Parkinson (January 17, 2010 11:34 AM)  Physical Exam  General:  Affect appropriate Healthy:  appears stated age HEENT: normal Neck supple with no adenopathy JVP normal no bruits no thyromegaly Lungs clear with no wheezing and good diaphragmatic motion Heart:  S1/S2 no murmur,rub, gallop or click PMI normal Abdomen: benighn, BS positve, no tenderness, no AAA no bruit.  No HSM or HJR Distal pulses intact with no bruits No edema Neuro non-focal Skin warm and dry    Impression & Recommendations:  Problem # 1:  HYPERTENSION (ICD-401.9) Improved on losartan  Continue current meds The following medications were removed from the medication list:    Cozaar 25 Mg Tabs (Losartan potassium) .[name]... 1 once daily Her updated medication list for this problem includes:    Metoprolol Succinate 50 Mg Xr24h-tab (Metoprolol succinate) .[name]...  Take one tablet by mouth daily    Losartan Potassium 25 Mg Tabs (Losartan potassium) .[name]... 1  tab by mouth once daily  The following medications were removed from the medication list:    Cozaar 25 Mg Tabs (Losartan potassium) .[name]... 1 once daily Her updated medication list for this problem includes:    Metoprolol Succinate 50 Mg Xr24h-tab (Metoprolol succinate) .[name]... Take one tablet by mouth daily    Losartan Potassium 25 Mg Tabs (Losartan potassium) .[name]... 1  tab by mouth once daily  Problem # 2:  HYPERLIPIDEMIA (ICD-272.4) F/U labs [place].  Continue low cholest diet Her updated medication list for this problem includes:    Tricor 145 Mg Tabs (Fenofibrate) .[name]... 1 tab by mouth once daily  Problem # 3:  CAD (ICD-414.00) Stable no angina Her updated medication list for this problem includes:    Metoprolol Succinate 50 Mg Xr24h-tab (Metoprolol succinate) .[name]... Take one tablet by  mouth daily  Problem # 4:  ANEMIA (ICD-285.9) Encouraged her to F/U with Dr Kinnie Scales and keep appt for colonoscopy.  Conintue iron replacement  Patient Instructions: 1)  Your physician recommends that you schedule a follow-up appointment in: 6 MONTHS WITH DR [name] 2)  Your physician recommends that you continue on your current medications as directed. Please refer to the Current Medication list given to you today. Prescriptions: LOSARTAN POTASSIUM 25 MG TABS (LOSARTAN POTASSIUM) 1  tab by mouth once daily  #90 x 3   Entered by:   Scherrie Bateman, LPN   Authorized by:   Colon Branch, MD, [facility]   Signed by:   Scherrie Bateman, LPN on 01/60/1093   Method used:   Electronically to        [facility]  [address] [phone]* (retail)       [address]       [place], [place]  [id number]       Ph: [phone] or [phone]       Fax: [phone]   RxID:   [id number]

## 2010-02-08 NOTE — Letter (Signed)
Summary: [facility] Note   [facility] Note   Imported By: Roderic Ovens 10/16/2009 16:47:26  _____________________________________________________________________  External Attachment:    Type:   Image     Comment:   External Document

## 2010-02-08 NOTE — Assessment & Plan Note (Signed)
Summary: consult for chronic cough   Copy to:  Charlton Haws Primary Provider/Referring Provider:  Dr Creola Corn  CC:  Pulmonary Consult.  History of Present Illness: The pt is a 77y/o female who I have been asked to see for chronic cough.  She has had a cough for one year, and tells me that it is intermittant in nature.  It is primarily dry, and has mildly worsened over the last one year.  She denies any aggrevating factors or alleviating factors, and has rare paroxysms.  She denies a tickle in her throat, but does have a globus sensation with throat clearing.  She is often hoarse in the am's upon arising, and can also occur in the evening after lying down.  She denies postnasal drip or other symptoms of AR.  She does have a h/o chronic GERD for which she takes aciphex.  She denies any h/o asthma.  She has had a recent cxr that was unremarkable.    Current Medications (verified): 1)  Tricor 145 Mg Tabs (Fenofibrate) .[name].. 1 Tab By Mouth Once Daily 2)  Metoprolol Succinate 25 Mg Xr24h-Tab (Metoprolol Succinate) .... Take One Tablet By Mouth Daily 3)  Metformin Hcl 500 Mg Tabs (Metformin Hcl) .[name].. 1 Tab Two Times A Day 4)  Synthroid 112 Mcg Tabs (Levothyroxine Sodium) .[name].. 1 Tab By Mouth Once Daily  Except 1 1/2 Tabs By Mouth On Sundays 5)  Aspirin Ec 325 Mg Tbec (Aspirin) .... Take One Tablet By Mouth Daily 6)  Citracal + D3 .[name].. 3 Tabs Once Daily 7)  Vitamin D 3 .... 2000mg  1 Tab Once Daily 8)  Cozaar 25 Mg Tabs (Losartan Potassium) .[name].. 1 Once Daily 9)  Omeprazole 20 Mg Cpdr (Omeprazole) .... Take 1 Tablet By Mouth Once A Day  Allergies (verified): 1)  ! Adhesive Tape  Past History:  Past Medical History:  HYPERLIPIDEMIA (ICD-272.4) HYPERTENSION (ICD-401.9) CAD (ICD-414.00) CHEST PAIN (ICD-786.50) ANEMIA (ICD-285.9) HYPOTHYROIDISM (ICD-244.9) HIATAL HERNIA (ICD-553.3) PLEURAL EFFUSION (ICD-511.9) DM (ICD-250.00) HYPERCHOLESTEROLEMIA (ICD-272.0) GERD (ICD-530.81) UTERINE  CANCER, HX OF (ICD-V10.42) OBESITY (ICD-278.00)   Probable yeast-related rash under both breasts.  Urinary tract infection   treated.  right leg deep vein thrombosis  Past Surgical History:  coronary  artery bypass surgery. x4 using a free left internal mammary artery graft to the left anterior descending coronary artery, with a saphenous vein graft to the diagonal branch of the left anterior descending artery, a saphenous vein graft to the obtuse marginal branch of the left circumflex coronary artery, and   a saphenous vein graft to the posterior descending branch of the right coronary artery.   4. Endoscopic vein harvesting from both thighs.  ATTENDING SURGEON:  Evelene Croon, M.D.    Excision cyst debridement fusion distal interphalangeal  joint right ring finger with Mini Acutrak screw 20 mm in length.     Bilateral  knee surgeries.   Bilateral feet surgery  surgery on 2 fingers on R hand.     Bilateral cataracts.   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  2000  Family History: Reviewed history from 07/02/2008 and no changes required.  Both mother and father are deceased in their 31s. Her   mother is status post bypass surgery and never fully recovered however, she died from cancer (unsure what kind).  She had a history of diabetes.  Her father  died in his sleep with a history of alcohol problems.  She has a  brother  and sister that are both living. Her brother was hospitalized at one point for meningitis for approximately 6 months resulting in hearing   loss and partial blindness.      Social History: Reviewed history from 07/02/2008 and no changes required.  She resides in [place] with her husband.   She has  three children, five grandchildren, five great grandchildren.   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  She denies any tobacco  history, alcohol, drug use, herbal medications.   She tries to maintain   an ADA diet.   She does not exercise secondary to her knee problems.  Review of Systems       The patient complains of productive cough, non-productive cough, acid heartburn, indigestion, weight change, and joint stiffness or pain.  The patient denies shortness of breath with activity, shortness of breath at rest, coughing up blood, chest pain, irregular heartbeats, loss of appetite, abdominal pain, difficulty swallowing, sore throat, tooth/dental problems, headaches, nasal congestion/difficulty breathing through nose, sneezing, itching, ear ache, anxiety, depression, hand/feet swelling, rash, change in color of mucus, and fever.    Vital Signs:  Patient profile:   77 year old female Height:      61 inches Weight:      188.38 pounds BMI:     35.72 O2 Sat:      95 % on Room air Temp:     98.2 degrees F oral Pulse rate:   72 / minute BP sitting:   158 / 80  (left arm) Cuff size:   large  Vitals Entered By: Arman Filter LPN (January 26, 2009 10:10 AM)  O2 Flow:  Room air CC: Pulmonary Consult Comments Medications reviewed with patient Arman Filter LPN  January 26, 2009 10:23 AM    Physical Exam  General:  obese female in nad Eyes:  PERRLA and EOMI.   Nose:  patent without discharge Mouth:  mild elongation of soft palate and uvula Neck:  no jvd, tmg, LN Lungs:  totally clear to auscultation Heart:  rrr, no mrg Abdomen:  soft and nontender, bs+ Extremities:  no significant edema, pulses intact distally  no cyanosis Neurologic:  alert and oriented, moves all 4.   Impression & Recommendations:  Problem # 1:  COUGH (ICD-786.2)  the pt has a chronic cough for one year that I suspect is upper airway in origin.  Her recent cxr shows no acute process, her lungs are clear today, and her h/o is not suggestive of a lower airway issue.  I have explained the most common causes of an upper airway cough are postnasal drip, laryngopharyngeal reflux, and cyclical cough mechanism.  Will  treat for postnasal drip and intensify her reflux regimen.  I have discussed with her the various behavioral therapies for a cyclical cough, including voice rest, no throat clearing, and hard candy to soothe back of throat.  Will work on the above and see her back in 2 weeks.  Medications Added to Medication List This Visit: 1)  Synthroid 112 Mcg Tabs (Levothyroxine sodium) .[name].. 1 tab by mouth once daily  except 1 1/2 tabs by mouth on sundays 2)  Citracal + D3  .[name].. 3 tabs once daily 3)  Omeprazole 20 Mg Cpdr (Omeprazole) .... Take 1 tablet by mouth once a day  Other Orders: Consultation Level IV (16109)  Patient Instructions: 1)  nasonex nasal spray 2 each nostril each am. 2)  chlorpheniramine 8mg  (over the counter) each night at bedtime.  3)  increase aciphex to am and also pm until next visit 4)  can try hard candy in mouth all thru the day to bathe back of throat and reduce irritation.  No peppermint or menthol 5)  limit voice use as much as possible.  No singing or yelling. 6)  followup with me in 2 weeks

## 2010-02-08 NOTE — Progress Notes (Signed)
Summary: allergy / cougj  Phone Note Call from Patient   Caller: Patient Call For: Chelsie Burel Summary of Call: pt have cough need allergy med and nasal spray that she was given [facility] [address] Initial call taken by: Rickard Patience,  May 31, 2009 11:05 AM  Follow-up for Phone Call        pt c/o cough-non prod, just dry and "hacky", having some PND--pt would like refill for nasonex  and chlorpheniramine, she states [facility] told her she could use these as needed is it ok to refill these meds--pls advise Follow-up by: Philipp Deputy CMA,  May 31, 2009 11:20 AM  Additional Follow-up for Phone Call Additional follow up Details #1::        ok to take otc chlorpheniramine 8mg  at bedtime. can fill nasonex #1, 6 fills. Additional Follow-up by: Barbaraann Share MD,  May 31, 2009 5:20 PM    Additional Follow-up for Phone Call Additional follow up Details #2::    called and spoke with pt and she is aware of recs from [facility]--nasonex has been sent to her pharmacy and pt is aware Follow-up by: Randell Loop CMA,  May 31, 2009 5:34 PM  Prescriptions: NASONEX 50 MCG/ACT SUSP (MOMETASONE FUROATE) 2 sprays in each nostril once daily  #1 x 6   Entered by:   Randell Loop CMA   Authorized by:   Waymon Budge MD   Signed by:   Randell Loop CMA on 05/31/2009   Method used:   [facility] to        [facility]  [address] [phone][facility] (retail)       [address]       [place], [place]  [id number]       Ph: [phone] or [phone]       Fax: [phone]   RxID:   [id number]

## 2010-02-08 NOTE — Letter (Signed)
Summary: [facility]   [facility]   Imported By: Roderic Ovens 08/15/2009 12:51:09  _____________________________________________________________________  External Attachment:    Type:   Image     Comment:   External Document

## 2010-02-08 NOTE — Assessment & Plan Note (Signed)
Summary: 6 month rov/sl  Medications Added METOPROLOL SUCCINATE 50 MG XR24H-TAB (METOPROLOL SUCCINATE) Take one tablet by mouth daily      Allergies Added:  Referring Provider:  Charlton Haws Primary Provider:  Dr Creola Corn  CC:  pt will call us when she gets home to verify med list.  History of Present Illness: Yashira is seen today for F/U of her hypertension, hyperlipidemia and CAD.  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  She has normal LV fucnton.  she had a normal Myoview on May 18, 2007.  There was no ischemia with an ejection fraction of 75%.  She continues to have occasional atypical twinges of sharp pain in the left side of her chest.  She had these previously and had a nonischemic Myoview.  I think the neuropathic or from her sternotomy and clearly does not sound like her angina.  She continues to have dry eyes and may need a corneal transplant on the left eye.  Her eye doctor is in [place].  She had right TKR with Dr Rennis Chris since I last saw her with no complications  Current Problems (verified): 1)  Degenerative Joint Disease, Both Knees, Severe  (ICD-715.96) 2)  Bronchitis Not Specified As Acute or Chronic  (ICD-490) 3)  Cough  (ICD-786.2) 4)  Hyperlipidemia  (ICD-272.4) 5)  Hypertension  (ICD-401.9) 6)  Cad  (ICD-414.00) 7)  Chest Pain  (ICD-786.50) 8)  Anemia  (ICD-285.9) 9)  Hypothyroidism  (ICD-244.9) 10)  Hiatal Hernia  (ICD-553.3) 11)  Pleural Effusion  (ICD-511.9) 12)  Dm  (ICD-250.00) 13)  Hypercholesterolemia  (ICD-272.0) 14)  Gerd  (ICD-530.81) 15)  Uterine Cancer, Hx of  (ICD-V10.42) 16)  Obesity  (ICD-278.00)  Current Medications (verified): 1)  Tricor 145 Mg Tabs (Fenofibrate) .[name].. 1 Tab By Mouth Once Daily 2)  Metoprolol Succinate 50 Mg Xr24h-Tab (Metoprolol Succinate) .... Take One Tablet By Mouth Daily 3)  Metformin Hcl 500 Mg Tabs (Metformin Hcl) .[name].. 1 Tab Two Times A Day 4)  Synthroid 112 Mcg Tabs (Levothyroxine Sodium) .[name].. 1 Tab By Mouth Once Daily   Except 1 1/2 Tabs By Mouth On Sundays 5)  Cozaar 25 Mg Tabs (Losartan Potassium) .[name].. 1 Once Daily 6)  Omeprazole 20 Mg Cpdr (Omeprazole) .... Take 1 Tablet By Mouth Once A Day  Allergies (verified): 1)  ! Adhesive Tape  Past History:  Past Medical History: Last updated: 01/26/2009  HYPERLIPIDEMIA (ICD-272.4) HYPERTENSION (ICD-401.9) CAD (ICD-414.00) CHEST PAIN (ICD-786.50) ANEMIA (ICD-285.9) HYPOTHYROIDISM (ICD-244.9) HIATAL HERNIA (ICD-553.3) PLEURAL EFFUSION (ICD-511.9) DM (ICD-250.00) HYPERCHOLESTEROLEMIA (ICD-272.0) GERD (ICD-530.81) UTERINE CANCER, HX OF (ICD-V10.42) OBESITY (ICD-278.00)   Probable yeast-related rash under both breasts.  Urinary tract infection   treated.  right leg deep vein thrombosis  Past Surgical History: Last updated: 01/26/2009  coronary  artery bypass surgery. x4 using a free left internal mammary artery graft to the left anterior descending coronary artery, with a saphenous vein graft to the diagonal branch of the left anterior descending artery, a saphenous vein graft to the obtuse marginal branch of the left circumflex coronary artery, and   a saphenous vein graft to the posterior descending branch of the right coronary artery.   4. Endoscopic vein harvesting from both thighs.  ATTENDING SURGEON:  Evelene Croon, M.D.    Excision cyst debridement fusion distal interphalangeal  joint right ring finger with Mini Acutrak screw 20 mm in length.     Bilateral  knee surgeries.   Bilateral feet surgery  surgery on 2 fingers on R  hand.     Bilateral cataracts.   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  2000  Family History: Last updated: 01/26/2009  Both mother and father are deceased in their 41s. Her   mother is status post bypass surgery and never fully recovered however, she died from cancer (unsure what kind).  She had a history of diabetes.  Her father  died in his sleep with a history  of alcohol problems.  She has a brother  and sister that are both living. Her brother was hospitalized at one point for meningitis for approximately 6 months resulting in hearing   loss and partial blindness.      Social History: Last updated: 01/26/2009  She resides in [place] with her husband.   She has  three children, five grandchildren, five great grandchildren.   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  She denies any tobacco  history, alcohol, drug use, herbal medications.   She tries to maintain  an ADA diet.   She does not exercise secondary to her knee problems.  Review of Systems       Denies fever, malais, weight loss, blurry vision, decreased visual acuity, cough, sputum, SOB, hemoptysis, pleuritic pain, palpitaitons, heartburn, abdominal pain, melena, lower extremity edema, claudication, or rash.   Vital Signs:  Patient profile:   77 year old female Height:      61 inches Weight:      175 pounds BMI:     33.19 Pulse rate:   70 / minute Resp:     14 per minute BP sitting:   130 / 80  (left arm)  Vitals Entered By: Kem Parkinson (July 20, 2009 3:09 PM)  Physical Exam  General:  Affect appropriate Healthy:  appears stated age HEENT: normal Neck supple with no adenopathy JVP normal no bruits no thyromegaly Lungs clear with no wheezing and good diaphragmatic motion Heart:  S1/S2 no murmur,rub, gallop or click PMI normal Abdomen: benighn, BS positve, no tenderness, no AAA no bruit.  No HSM or HJR Distal pulses intact with no bruits No edema Neuro non-focal Skin warm and dry    Impression & Recommendations:  Problem # 1:  HYPERLIPIDEMIA (ICD-272.4)  Labs per Dr Timothy Lasso. Her updated medication list for this problem includes:    Tricor 145 Mg Tabs (Fenofibrate) .[name]... 1 tab by mouth once daily  Her updated medication list for this problem includes:    Tricor 145 Mg Tabs (Fenofibrate) .[name]... 1 tab by mouth once daily  Problem # 2:   HYPERTENSION (ICD-401.9)  Well controlled The following medications were removed from the medication list:    Aspirin Ec 325 Mg Tbec (Aspirin) .[name]... Take one tablet by mouth daily Her updated medication list for this problem includes:    Metoprolol Succinate 50 Mg Xr24h-tab (Metoprolol succinate) .[name]... Take one tablet by mouth daily    Cozaar 25 Mg Tabs (Losartan potassium) .[name]... 1 once daily  The following medications were removed from the medication list:    Aspirin Ec 325 Mg Tbec (Aspirin) .[name]... Take one tablet by mouth daily Her updated medication list for this problem includes:    Metoprolol Succinate 50 Mg Xr24h-tab (Metoprolol succinate) .[name]... Take one tablet by mouth daily    Cozaar 25 Mg Tabs (Losartan potassium) .[name]... 1 once daily  Problem # 3:  CAD (ICD-414.00)  Stable with no angina.  Contnue asa and bb The following medications were removed  from the medication list:    Aspirin Ec 325 Mg Tbec (Aspirin) .[name]... Take one tablet by mouth daily Her updated medication list for this problem includes:    Metoprolol Succinate 50 Mg Xr24h-tab (Metoprolol succinate) .[name]... Take one tablet by mouth daily  The following medications were removed from the medication list:    Aspirin Ec 325 Mg Tbec (Aspirin) .[name]... Take one tablet by mouth daily Her updated medication list for this problem includes:    Metoprolol Succinate 50 Mg Xr24h-tab (Metoprolol succinate) .[name]... Take one tablet by mouth daily  Patient Instructions: 1)  Your physician recommends that you schedule a follow-up appointment in: 6 MONTHS WITH DR [name] 2)  Your physician recommends that you continue on your current medications as directed. Please refer to the Current Medication list given to you today.

## 2010-02-08 NOTE — Letter (Signed)
Summary: [facility]   [facility] Note   Imported By: Roderic Ovens 01/04/2010 10:02:07  _____________________________________________________________________  External Attachment:    Type:   Image     Comment:   External Document

## 2010-02-08 NOTE — Letter (Signed)
Summary: [facility]   [facility]   Imported By: Roderic Ovens 04/27/2009 10:34:07  _____________________________________________________________________  External Attachment:    Type:   Image     Comment:   External Document

## 2010-02-08 NOTE — Assessment & Plan Note (Signed)
Summary: Diana Wood  Medications Added METFORMIN HCL 500 MG TABS (METFORMIN HCL) 1 tab two times a day CALCIUM CARBONATE   POWD (CALCIUM CARBONATE) 3 tabs once daily * VITAMIN D 3 2000mg  1 tab once daily COZAAR 25 MG TABS (LOSARTAN POTASSIUM) 1 once daily      Allergies Added: NKDA  Visit Type:  Follow-up Primary Provider:  Dr Creola Corn  CC:  hot flashes  and sob and a cough.  History of Present Illness: Diana Wood is seen today for F/U of her hypertension, hyperlipidemia and CAD.  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  She has normal LV fucnton.  she had a normal Myoview on May 18, 2007.  There was no ischemia with an ejection fraction of 75%.  She continues to have occasional atypical twinges of sharp pain in the left side of her chest.  She had these previously and had a nonischemic Myoview.  I think the neuropathic or from her sternotomy and clearly does not sound like her angina.  Y. she's been doing fairly well.  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  She still has some blurriness to her vision.  She has significant bilateral knee problems and has had arthroscopies of both knees.  She sees Dr.Supple for this.   She will likely need a knee replacent next year.  Her heart should be fine for this. She has not had any significant dyspnea palpitations PND or orthopnea there've been no lower extremity edema or syncope.   She has had a chronic cough for over a year.  Dr Timothy Lasso did a CXR last week and it was ok.  I wouild like her to see our lung doctors and adviced her to take Mucinex.  She likely has some refulux but I heard her cough today and it is significant.   Shre's been compliant with her medications and follows up with Dr.Russo for her primary care needs.  She indicates that her blood sugar and cholesterol under good control. However her Hb A1c was 6.9.  I had a long discussion with her about a low carb diet.  She eats too much bread and grits.  She is also not on an ACE.  She  indicates a ? problem with them before but her cough is clearly not related to them and she should at least be on an ARB to protect her kidneys and her BP would appear to be slightly elevated as well.    Current Problems (verified): 1)  Bronchitis Not Specified As Acute or Chronic  (ICD-490) 2)  Cough  (ICD-786.2) 3)  Hyperlipidemia  (ICD-272.4) 4)  Hypertension  (ICD-401.9) 5)  Cad  (ICD-414.00) 6)  Chest Pain  (ICD-786.50) 7)  Anemia  (ICD-285.9) 8)  Hypothyroidism  (ICD-244.9) 9)  Hiatal Hernia  (ICD-553.3) 10)  Pleural Effusion  (ICD-511.9) 11)  Dm  (ICD-250.00) 12)  Hypercholesterolemia  (ICD-272.0) 13)  Gerd  (ICD-530.81) 14)  Uterine Cancer, Hx of  (ICD-V10.42) 15)  Obesity  (ICD-278.00)  Current Medications (verified): 1)  Tricor 145 Mg Tabs (Fenofibrate) .[name].. 1 Tab By Mouth Once Daily 2)  Metoprolol Succinate 25 Mg Xr24h-Tab (Metoprolol Succinate) .... Take One Tablet By Mouth Daily 3)  Metformin Hcl 500 Mg Tabs (Metformin Hcl) .[name].. 1 Tab Two Times A Day 4)  Synthroid 112 Mcg Tabs (Levothyroxine Sodium) .[name].. 1 Tab By Mouth Once Daily 5)  Aspirin Ec 325 Mg Tbec (Aspirin) .... Take One Tablet By Mouth Daily 6)  Calcium  Carbonate   Powd (Calcium Carbonate) .... 3 Tabs Once Daily 7)  Vitamin D 3 .... 2000mg  1 Tab Once Daily 8)  Cozaar 25 Mg Tabs (Losartan Potassium) .[name].. 1 Once Daily  Allergies (verified): No Known Drug Allergies  Past History:  Past Medical History: Last updated: 07/02/2008 Current Problems:  HYPERLIPIDEMIA (ICD-272.4) HYPERTENSION (ICD-401.9) CAD (ICD-414.00) CHEST PAIN (ICD-786.50) ANEMIA (ICD-285.9) HYPOTHYROIDISM (ICD-244.9) HIATAL HERNIA (ICD-553.3) PLEURAL EFFUSION (ICD-511.9) DM (ICD-250.00) HYPERCHOLESTEROLEMIA (ICD-272.0) GERD (ICD-530.81) UTERINE CANCER, HX OF (ICD-V10.42) OBESITY (ICD-278.00)   Probable yeast-related rash under both breasts.  Urinary tract infection   treated.  right leg deep vein thrombosis  Past Surgical  History: Last updated: 07/02/2008  coronary  artery bypass surgery. x4 using a free left internal mammary artery graft to the left anterior descending coronary artery, with a saphenous vein graft to the diagonal branch of the left anterior descending artery, a saphenous vein graft to the obtuse marginal branch of the left circumflex coronary artery, and   a saphenous vein graft to the posterior descending branch of the right coronary artery.   4. Endoscopic vein harvesting from both thighs.  ATTENDING SURGEON:  Evelene Croon, M.D.    Excision cyst debridement fusion distal interphalangeal  joint right ring finger with Mini Acutrak screw 20 mm in length.      Right knee surgery.    Right heel surgery.     Bilateral cataracts.    Previous hysterectomy for diagnosis of uterine cancer.   Family History: Last updated: 07/02/2008  Both mother and father are deceased in their 52s. Her   mother is status post bypass surgery and never fully recovered however, she died from lung cancer.  She had a history of diabetes.  Her father  died in his sleep with a history of alcohol problems.  She has a brother  and sister that are both living. Her brother was hospitalized at one point for meningitis for approximately 6 months resulting in hearing   loss and partial blindness.      Social History: Last updated: 07/02/2008  She resides in [place] with her husband.  She has   three children, five grandchildren, five great grandchildren.  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 She denies any tobacco   history, alcohol, drug use, herbal medications.  She tries to maintain   an ADA diet.  She does not exercise secondary to her knee problems.  Review of Systems       Denies fever, malais, weight loss, blurry vision, decreased visual acuity, cough, sputum, SOB, hemoptysis, pleuritic pain, palpitaitons, heartburn, abdominal pain, melena, lower extremity edema, claudication, or rash. all other  systems reviewed and negative  Vital Signs:  Patient profile:   77 year old female Height:      61 inches Weight:      185 pounds BMI:     35.08 Pulse rate:   77 / minute BP sitting:   160 / 78  (left arm) Cuff size:   large  Vitals Entered By: Burnett Kanaris, CNA (January 11, 2009 10:05 AM)  Physical Exam  General:  Affect appropriate Healthy:  appears stated age HEENT: normal Neck supple with no adenopathy JVP normal no bruits no thyromegaly Lungs clear with no wheezing and good diaphragmatic motion Heart:  S1/S2 no murmur,rub, gallop or click PMI normal Abdomen: benighn, BS positve, no tenderness, no AAA no bruit.  No HSM or HJR Distal pulses intact with no bruits No edema Neuro non-focal Skin  warm and dry    Impression & Recommendations:  Problem # 1:  BRONCHITIS NOT SPECIFIED AS ACUTE OR CHRONIC (ICD-490) Chronic cough with normal CXR To start mucinex and see pulmonary Orders: Pulmonary Referral (Pulmonary)  Problem # 2:  HYPERLIPIDEMIA (ICD-272.4) Will get labs from Dr Robert Bellow office Her updated medication list for this problem includes:    Tricor 145 Mg Tabs (Fenofibrate) .[name]... 1 tab by mouth once daily  Problem # 3:  CAD (ICD-414.00) S/P CABG with normal LV funciton.  Continue ASA and BB Her updated medication list for this problem includes:    Metoprolol Succinate 25 Mg Xr24h-tab (Metoprolol succinate) .[name]... Take one tablet by mouth daily    Aspirin Ec 325 Mg Tbec (Aspirin) .[name]... Take one tablet by mouth daily  Problem # 4:  HYPERTENSION (ICD-401.9) Diabetic not on ACE with elevated BP.  Start generic cozaar.  F/U labs 6 weeks after start BMET Her updated medication list for this problem includes:    Metoprolol Succinate 25 Mg Xr24h-tab (Metoprolol succinate) .[name]... Take one tablet by mouth daily    Aspirin Ec 325 Mg Tbec (Aspirin) .[name]... Take one tablet by mouth daily    Cozaar 25 Mg Tabs (Losartan potassium) .[name]... 1 once daily  Problem # 5:   DEGENERATIVE JOINT DISEASE, BOTH KNEES, SEVERE (ICD-715.96) F/U Dr Rennis Chris Has had steriod injections.  Clear for surgery if needed next year.  Encouraged weight loss and water activity.  Did not want to go to cardiac rehab  Patient Instructions: 1)  Your physician recommends that you schedule a follow-up appointment in: 6 months with dr [name] 2)  Your physician recommends that you return for lab work in:6 weeks  bmet v58.69 3)  Your physician has recommended you make the following change in your medication: cozaar 25 mg 1 tab daily 4)  You have been referred to pulmonary  for chronic cough and bronchitis Prescriptions: COZAAR 25 MG TABS (LOSARTAN POTASSIUM) 1 once daily  #30 x 2   Entered by:   Scherrie Bateman, LPN   Authorized by:   Colon Branch, MD, [facility]   Signed by:   Scherrie Bateman, LPN on 16/10/9602   Method used:   [facility]        [facility]  [address] [phone][facility] (retail)       [address]       [place], [place]  [id number]       Ph: [phone] or [phone]       Fax: [phone]   RxID:   [id number]   Appended Document: f28mon per dr [name], pt low risk cardiac wise for procedure.

## 2010-03-30 LAB — URINE MICROSCOPIC-ADD ON

## 2010-03-30 LAB — BASIC METABOLIC PANEL
CO2: 27 mEq/L (ref 19–32)
Calcium: 8.8 mg/dL (ref 8.4–10.5)
Creatinine, Ser: 0.88 mg/dL (ref 0.4–1.2)
Creatinine, Ser: 0.93 mg/dL (ref 0.4–1.2)
GFR calc Af Amer: >60 mL/min (ref >60)
GFR calc Af Amer: >60 mL/min (ref >60)
GFR calc non Af Amer: 59 mL/min — ABNORMAL LOW (ref >60)
GFR calc non Af Amer: >60 mL/min (ref >60)
Glucose, Bld: 147 mg/dL — ABNORMAL HIGH (ref 70–99)
Glucose, Bld: 150 mg/dL — ABNORMAL HIGH (ref 70–99)
Sodium: 135 mEq/L (ref 135–145)
Sodium: 137 mEq/L (ref 135–145)

## 2010-03-30 LAB — URINALYSIS, ROUTINE W REFLEX MICROSCOPIC
Bilirubin Urine: NEGATIVE
Glucose, UA: 100 mg/dL — AB
Glucose, UA: NEGATIVE mg/dL
Hgb urine dipstick: NEGATIVE
Hgb urine dipstick: NEGATIVE
Ketones, ur: NEGATIVE mg/dL
Ketones, ur: NEGATIVE mg/dL
Nitrite: NEGATIVE
Nitrite: NEGATIVE
Protein, ur: NEGATIVE mg/dL
Protein, ur: NEGATIVE mg/dL
Specific Gravity, Urine: 1.022 (ref 1.005–1.030)
Urobilinogen, UA: 0.2 mg/dL (ref 0.0–1.0)
pH: 6 (ref 5.0–8.0)

## 2010-03-30 LAB — GLUCOSE, CAPILLARY
Glucose-Capillary: 129 mg/dL — ABNORMAL HIGH (ref 70–99)
Glucose-Capillary: 130 mg/dL — ABNORMAL HIGH (ref 70–99)
Glucose-Capillary: 135 mg/dL — ABNORMAL HIGH (ref 70–99)
Glucose-Capillary: 138 mg/dL — ABNORMAL HIGH (ref 70–99)
Glucose-Capillary: 144 mg/dL — ABNORMAL HIGH (ref 70–99)
Glucose-Capillary: 146 mg/dL — ABNORMAL HIGH (ref 70–99)
Glucose-Capillary: 146 mg/dL — ABNORMAL HIGH (ref 70–99)
Glucose-Capillary: 151 mg/dL — ABNORMAL HIGH (ref 70–99)
Glucose-Capillary: 155 mg/dL — ABNORMAL HIGH (ref 70–99)
Glucose-Capillary: 158 mg/dL — ABNORMAL HIGH (ref 70–99)
Glucose-Capillary: 160 mg/dL — ABNORMAL HIGH (ref 70–99)

## 2010-03-30 LAB — CBC
HCT: 28.1 % — ABNORMAL LOW (ref 36.0–46.0)
HCT: 36.6 % (ref 36.0–46.0)
Hemoglobin: 12.4 g/dL (ref 12.0–15.0)
Hemoglobin: 9.5 g/dL — ABNORMAL LOW (ref 12.0–15.0)
Hemoglobin: 9.9 g/dL — ABNORMAL LOW (ref 12.0–15.0)
MCHC: 32.9 g/dL (ref 30.0–36.0)
MCHC: 33.8 g/dL (ref 30.0–36.0)
MCV: 86 fL (ref 78.0–100.0)
Platelets: 253 10*3/uL (ref 150–400)
Platelets: 268 10*3/uL (ref 150–400)
Platelets: 318 10*3/uL (ref 150–400)
RBC: 3.27 MIL/uL — ABNORMAL LOW (ref 3.87–5.11)
RBC: 3.44 MIL/uL — ABNORMAL LOW (ref 3.87–5.11)
RDW: 14.4 % (ref 11.5–15.5)
RDW: 14.8 % (ref 11.5–15.5)
WBC: 13.3 10*3/uL — ABNORMAL HIGH (ref 4.0–10.5)
WBC: 8.7 10*3/uL (ref 4.0–10.5)

## 2010-03-30 LAB — COMPREHENSIVE METABOLIC PANEL
ALT: 58 U/L — ABNORMAL HIGH (ref 0–35)
BUN: 12 mg/dL (ref 6–23)
CO2: 25 mEq/L (ref 19–32)
Chloride: 108 mEq/L (ref 96–112)
GFR calc non Af Amer: >60 mL/min (ref >60)
Potassium: 4.2 mEq/L (ref 3.5–5.1)
Sodium: 141 mEq/L (ref 135–145)

## 2010-03-30 LAB — PROTIME-INR
INR: 1.04 (ref 0.00–1.49)
INR: 1.13 (ref 0.00–1.49)
INR: 1.34 (ref 0.00–1.49)
INR: 1.48 (ref 0.00–1.49)
Prothrombin Time: 13.5 seconds (ref 11.6–15.2)
Prothrombin Time: 14.4 seconds (ref 11.6–15.2)
Prothrombin Time: 16.2 seconds — ABNORMAL HIGH (ref 11.6–15.2)
Prothrombin Time: 16.5 seconds — ABNORMAL HIGH (ref 11.6–15.2)
Prothrombin Time: 17.8 seconds — ABNORMAL HIGH (ref 11.6–15.2)

## 2010-03-30 LAB — URINE CULTURE
Colony Count: NO GROWTH
Culture: NO GROWTH

## 2010-05-22 NOTE — Cardiovascular Report (Signed)
Diana Wood, Diana Wood                 ACCOUNT NO.:  [id number]   MEDICAL RECORD NO.:  [id number]          PATIENT TYPE:  INP   LOCATION:  2807                         FACILITY:  [facility]   PHYSICIAN:  Rollene Rotunda, MD, FACCDATE OF BIRTH:  11/18/1932   DATE OF PROCEDURE:  11/13/2006  DATE OF DISCHARGE:                            CARDIAC CATHETERIZATION   PRIMARY CARE PHYSICIAN:  Dr. Creola Corn.   PROCEDURE:  Left heart catheterization/coronary angiography.   INDICATIONS:  Evaluate patient with unstable angina and elevated  troponins.   DESCRIPTION OF PROCEDURE:  Left heart catheterization was performed via  the right femoral artery. The artery was cannulated using anterior wall  puncture. A #6-French arterial sheath was inserted via the Modified  Seldinger technique.  Preformed Judkins and a pigtail catheter were  utilized. The patient tolerated the procedure well, although she did get  chest discomfort after injection of the right coronary.  This was  managed with increased doses of IV nitroglycerin, sublingual  nitroglycerin and morphine.  She had complete resolution of her  symptoms.  She left the lab in stable condition.   RESULTS:   HEMODYNAMICS:  LV 153/18, AO 153/76.  CORONARIES:  The left main had 25%  stenosis with calcification.  The LAD had diffuse proximal 25% stenosis  with calcification.  There is a mid 90% stenosis after a large diagonal.  The first diagonal was moderate sized with 40% stenosis.  The circumflex  in the AV groove had luminal irregularities. The mid obtuse marginal had  ostial 90% stenosis and was a large vessel.  The right coronary artery  was a dominant vessel.  There was diffuse 25-30% lesions in the proximal  mid vessel.  There was distal 99% stenosis before the PDA.  The PDA was  moderate size and free of high-grade disease.   LEFT VENTRICULOGRAM:  The left ventriculogram was obtained in the RAO  projection.  The EF was 60% with no significant  wall motion  abnormalities.   CONCLUSION:  Severe three-vessel coronary artery disease.   PLAN:  The patient will be referred for a CABG.      Rollene Rotunda, MD, [facility]  Electronically Signed     JH/MEDQ  D:  11/13/2006  T:  11/13/2006  Job:  [id number]   cc:   Gwen Pounds, MD

## 2010-05-22 NOTE — Assessment & Plan Note (Signed)
[name]                            CARDIOLOGY OFFICE NOTE   Diana, Wood                        MRN:          [id number]  DATE:12/29/2007                            DOB:          1932-02-15    Diana Wood returns today for followup.  She has had previous coronary  artery bypass surgery.  She had some atypical chest pain.  Back in June,  I did a CT scan on her to make sure her chest has healed well.  Her  sternotomy was well-healed with no evidence of mediastinitis.  There was  no pulmonary parenchymal disease and no aneurysmal disease.  She still  gets occasional twinges of pain, but they are atypical, they sound  musculoskeletal, they certainly do not sound like angina.   Otherwise, she is doing well.  Her weight continues to be up and her  diabetes poorly controlled.  She has poor diet.  We spent considerable  time talking to her about low-carbohydrate diets.  She sees Dr. Timothy Lasso  who should be able to help her with a nutrition referral.  She said she  has seen a nutritionist before, but it is fairly obvious that she has  poor caloric intake.  Interestingly, she appears to be a significant  type 2 diabetic with end organ damage and is not on an ACE inhibitor.   REVIEW OF SYSTEMS:  Significant problems with her vision.  She  apparently is going to due to have a corneal transplant.   She just had hand surgery by Dr. Merlyn Lot for mucoid cyst.   In general, [name] seems to be fallowing apart little bit outside of  her heart.   MEDICATIONS:  1. Aciphex 20 a day.  2. An aspirin a day.  3. Omeprazole 20 a day.  4. TriCor 145 a day.  5. Toprol 25 a day.  6. Metformin 500 b.i.d.  7. Mobic p.r.n.  8. Synthroid 112 mcg a day.   We will [facility] [address].   PHYSICAL EXAMINATION:  GENERAL:  Remarkable for an overweight white  female with good affect.  VITAL SIGNS:  Blood pressure was 160/86,  pulse 80 and regular,  respiratory rate 14, afebrile.  HEENT:  Unremarkable.  NECK:  Carotids are normal without bruit.  No lymphadenopathy,  thyromegaly, or JVP elevation.  LUNGS:  Clear.  Good diaphragmatic motion.  No wheezing.  CARDIAC:  S1, S2 with normal heart sounds.  PMI normal.  ABDOMEN:  Benign.  Bowel sounds positive.  No AAA.  No tenderness.  No  bruit.  No hepatosplenomegaly or hepatojugular reflux.  No tenderness.  EXTREMITIES:  Distal pulses are intact.  No edema.  NEUROLOGIC:  Nonfocal.  SKIN:  Warm and dry.  MUSCULOSKELETAL:  No muscular weakness.  She has her index finger of  right hand in a cast.  She is to see Dr. Merlyn Lot today.   IMPRESSION:  1. Coronary artery disease, previous coronary artery bypass.  Continue      aspirin and beta-blocker.  2.  Atypical chest pain likely musculoskeletal in nature.  Continue      p.r.n. Mobic.  3. Diabetes, poor diet.  Refer back to nutritionist.  Continue      metformin and follow up with Dr. Timothy Lasso.  Hemoglobin A1c target 6.5      or less.  4. Hypertension in the setting of diabetes.  Start lisinopril 10 mg a      day.  Check BMET in 8 weeks, low-sodium diet.  5. History of reflux.  Continue omeprazole.  Weight loss, again we      will help here.  6. History of hypothyroidism.  Continue Synthroid.  TSH and T4 per Dr.      Timothy Lasso.  7. Hypercholesterolemia.  Continue TriCor.  Low-fat diet encouraged.  8. 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  Surgical follow up with Dr. Merlyn Lot      later today.   I will see her back in 6 months.     [name]. [name], MD, [facility]  Electronically Signed    PCN/MedQ  DD: 12/29/2007  DT: 12/29/2007  Job #: [id number]

## 2010-05-22 NOTE — Discharge Summary (Signed)
[name], Wood                 ACCOUNT NO.:  [id number]   MEDICAL RECORD NO.:  [id number]          PATIENT TYPE:  INP   LOCATION:  2007                         FACILITY:  [facility]   PHYSICIAN:  Evelene Croon, M.D.     DATE OF BIRTH:  Jun 11, 1932   DATE OF ADMISSION:  11/13/2006  DATE OF DISCHARGE:  11/22/2006                               DISCHARGE SUMMARY   HISTORY OF PRESENT ILLNESS:  The patient is a 77 year old white female  who presented to the emergency room on the date of admission with  complaints of chest discomfort.  She states that she developed anterior  chest pressure for the first time on the day prior around noon while  watching TV and post sewing.  The discomfort radiated into her left arm  and jaw.  She denied any other associated symptoms.  The discomfort  lasted for less than 40 minutes, but she did give it a 10 on a scale of  0 to 10.  She was unsure of any alleviating or precipitating factors.  She had three more similar episodes on that day.  She did take Tylenol  without relief.  The last episode lasted a little longer than usual and  was felt to be worse in intensity and her husband drove her to the  emergency department.  After being treated with aspirin, intravenous  heparin and nitroglycerin, she resolved her chest pain.  She was seen in  cardiology consultation for admission for further evaluation and  treatment.   ALLERGIES:  No known drug allergies.   PAST MEDICAL HISTORY:  Includes the following:  1. Hypertension.  2. History of hypothyroidism.  3. History of diabetes since 1999.  4. History of hyperlipidemia.  5. Gastroesophageal reflux disease.  6. Hiatal hernia.  7. Right knee surgery.  8. Right heel surgery.  9. Bilateral cataracts.  10.Previous hysterectomy for diagnosis of uterine cancer.   MEDICATIONS PRIOR TO ADMISSION:  1. Aciphex 20 mg daily.  2. Cozaar 50 mg daily.  3. Toprol XL 50 mg daily.  4. Tricor 145 mg daily.  5. Synthroid  200 mcg daily.  6. An antifungal cream which she applies underneath her breasts.   FAMILY HISTORY/SOCIAL HISTORY/REVIEW OF SYMPTOMS/PHYSICAL EXAMINATION:  Please see the history and physical done at the time of admission.   HOSPITAL COURSE:  Patient was admitted with acute coronary syndrome with  initial EKG that was normal, however she did have abnormal troponins.  Her initial one was elevated at 0.23.  She was placed on intravenous  heparin and nitroglycerin and cardiac catheterization was felt to be  warranted.  She was stabilized enough medically to proceed to the cath  lab on November 12, 2005 at which time she was found to have severe  multivessel coronary artery disease.  The LAD had a 90% mid-vessel  stenosis just after the takeoff of a moderate size diagonal Brach.  The  diagonal itself had proximal stenosis at the ostium.  The left  circumflex had a single large marginal branch that had 90% stenosis.  The right coronary artery had  99% stenosis before the takeoff of the  posterior descending branch.  Left ventricular ejection fraction was  about 60%.  There was no gradient across the aortic valve and no mitral  regurgitation.  Due to these findings, surgical consultation was  obtained with Evelene Croon, MD who evaluated the patient and study and  recommended proceeding with surgical revascularization.  Prior to  surgery, she was seen by Dr. Timothy Lasso for ongoing management for her  hypothyroidism as well as her diarrhea.  She was deemed to be acceptable  for proceeding with surgery being stable from a medical view point on  November 17, 2006.  The following procedure was performed:  A coronary  artery bypass grafting x4.  The following grafts were placed:  1) Free  left  internal mammary to the LAD, 2) Saphenous vein graft to he  diagonal, 3) Saphenous vein graft to the obtuse marginal, and 4)  Saphenous vein graft to the posterior descending.  Procedure was  performed by Evelene Croon, MD, tolerated it well, and she was taken to  the surgical intensive care unit in stable condition.   POSTOPERATIVE HOSPITAL COURSE:  Patient has overall done quite well.  She has remained neurologically intact.  She has remained  hemodynamically stable.  She has had no significant cardiac dysrhythmias  postoperatively.  Her laboratory values do reveal a moderate  postoperative anemia.  This is stable.  Her most recent hemoglobin and  hematocrit dated November 19, 2006 are 10.4 and 30.5, respectively.  Electrolytes, BUN and creatinine are within normal limits.  She has  required some potassium supplementation.  She has required a moderate  gentle diuresis.  She was followed also postoperatively by Dr. Timothy Lasso  regarding her medical comorbidities.  He has adjusted her diabetes and  thyroid medications during hospitalization.  Her incisions are all  healing well without signs of infection.  Her activity has been  increased in a routine fashion following standard, postoperative,  cardiac rehabilitation protocols.  Her oxygen has been weaned and she  maintains good saturations on room air.  All routine lines, monitors and  drainage devices have been discontinued in the standard fashion.  Her  overall status is felt to be tentatively stable for discharge in the  morning of November 22, 2006 pending morning-round reevaluation.   INSTRUCTIONS:  1. The patient will receive written instructions in regard to      medications, activity, diet, wound care and followup.  2. Followup will include Dr. [name] on December 09, 2006 at the [place]      office.  3. Additionally, Dr. Donata Clay will see the patient on December 12, 2006 at 2:15 for Dr. Laneta Simmers who will be at [facility]      during that timeframe.  4. She is also instructed to follow up with Dr. Timothy Lasso in three to four      weeks.   MEDICATIONS AT DISCHARGE:  Are as follows:  1. Aspirin 325 mg daily.  2. Toprol XL 25 mg  daily.  3. Tricor 145 mg daily.  4. Aciphex 20 mg daily.  5. Lipitor 40 mg daily.  6. Synthroid 200 mcg daily.  7. Glucophage 500 mg twice daily.  8. Januvia 100 mg daily.  9. Lasix 40 mg daily for five days.  10.K-Dur 20 mEq daily for five days.  11.For pain, oxycodone 5 mg one or two every six hours as needed.   FINAL DIAGNOSES:  Include  severe multivessel coronary artery disease,  status post non-ST-segment elevation myocardial infarction, now status  post revascularization as described.   OTHER DIAGNOSES:  Include:  1. Hypertension.  2. Normocytic anemia.  3. Also, postoperative acute blood loss anemia.  4. Obesity.  5. Probable yeast-related rash under both breasts.  6. Abnormal chest x-ray on admission.  7. Elevated D-dimer on admission.  8. Hypothyroidism.  9. Diabetes mellitus type 2; currently on oral agents, previously on      diet control prior to this admission.  10.Urinary tract infection this admission; treated.  11.Previous surgeries as listed.  12.Gastroesophageal reflux.  13.Hiatal hernia.  14.Hyperlipidemia.  15.Previous history of uterine cancer.  16.Previous history of right leg deep vein thrombosis.  17.Cardiomegaly and chronic bronchitic changes on chest x-ray.   Dictation ended at this point.      Rowe Clack, P.A.-C.      Evelene Croon, M.D.  Electronically Signed    WEG/MEDQ  D:  11/21/2006  T:  11/21/2006  Job:  [id number]   cc:   Diana Wood. [name], MD, [facility]  Gwen Pounds, MD  Cindee Salt, M.D.

## 2010-05-22 NOTE — Assessment & Plan Note (Signed)
[facility]                            CARDIOLOGY OFFICE NOTE   SLOKA, VOLANTE                        MRN:          [id number]  DATE:12/09/2006                            DOB:          10/26/1932    Diana Wood returns today for followup.  She was admitted the  beginning of November with a subendocardial myocardial infarction, had 3-  vessel disease with good LV function.  She was operated on November 17, 2006 by Dr. Laneta Simmers.  Her postoperative course was uncomplicated.  She is  a diabetic with hypercholesterolemia.   She has been doing well postoperatively.   She has not had any significant chest pain, PND, orthopnea, there have  been no palpitations. She has been compliant with her medications. She  has not started cardiac rehab yet.   In talking to the patient she really has no complaints.  Her review of  systems is otherwise negative.   CURRENT MEDICATIONS:  Include Aciphex 20 daily, Lipitor 40 daily,  Synthroid 200 mcg daily, Glucophage 500 b.i.d., Januvia 100 daily and an  aspirin a day. She was on Lasix and potassium for 5 days  postoperatively.   EXAMINATION:  Her exam today is remarkable for an overweight diabetic  white female in no distress.  Weight is 181. Blood pressure is 120/72,  pulse 85 and regular, respiratory rate 16, afebrile.  HEENT:  Unremarkable.  NECK:  Carotids are without bruit.  No lymphadenopathy, thyromegaly or  JVP elevation.  LUNGS:  Clear with diaphragmatic motion, no wheezing.  CARDIAC:  Sternotomy is well-healed. There is an S1, S2, normal heart  sounds, PMI is not palpable due to her large amount of breast tissue.  There is no rub.  ABDOMEN:  Protuberant, bowel sounds are positive.  No triple-A, no  hepatosplenomegaly or hepatojugular reflux.  EXTREMITIES:  Distal pulses are intact with no edema, she has endoscopic  vein harvest sites medially in both legs which are a little bit  indurated but  healing well.   Her EKG shows sinus rhythm with poor R wave progression.  Chest x-ray  shows status post sternotomy with a very small right pleural effusion.   IMPRESSION:  1. Stable 3-vessel disease with good LV function status post CABG.      Continue aspirin, currently not on beta blocker.  With history of      diabetes I think we will keep her where she is for now.  2. Hypercholesterolemia in the setting of bypass.  Continue Lipitor 40      a day.  Liver profile in 6 months.  3. Hypothyroidism.  TSH, T4 normal in the hospital. Continue fairly      high dose at 200 mcg  of Synthroid.  TSH and T4 again in 6 months.  4. Diabetes.  She checks her sugar at home at least once a day.      Hemoglobin A1C goal will be 6, to be followed by primary care MD.  5. Small right pleural effusion.  Do not think she needs any diuretics  for this if it is not audible on exam and she is not short of      breath.  She will bring the films to see Dr. Laneta Simmers.   We will try to get her enrolled in cardiac rehab; 1-month refills on her  medications were given.  I will see her back in 3 months.     [name]. [name], MD, [facility]  Electronically Signed    PCN/MedQ  DD: 12/09/2006  DT: 12/09/2006  Job #: [id number]   cc:   Evelene Croon, M.D.

## 2010-05-22 NOTE — H&P (Signed)
Diana Wood, Diana Wood                 ACCOUNT NO.:  [id number]   MEDICAL RECORD NO.:  [id number]          PATIENT TYPE:  INP   LOCATION:  1824                         FACILITY:  [facility]   PHYSICIAN:  [name]. [name], MD, FACCDATE OF BIRTH:  Feb 02, 1932   DATE OF ADMISSION:  11/13/2006  DATE OF DISCHARGE:                              HISTORY & PHYSICAL   PRIMARY CARE PHYSICIAN:  Dr. Creola Corn.   PRIMARY CARDIOLOGIST:  New to our practice, Dr. [name].   BRIEF HISTORY:  Diana Wood is a 77 year old white female who presented to  the emergency room earlier this morning complaining of chest discomfort.  She states that she developed anterior chest pressure for the first time  yesterday afternoon around noon while watching TV and post sewing. The  discomfort radiated into her left arm and jaw.  She denied any  associated nausea, vomiting, shortness of breath or diaphoresis.  The  discomfort lasted for less than 40 minutes and she gave it a 10 on a  scale of 0-10.  She is not sure of any alleviating or precipitating  factors.  She had three more similar episodes later in the day, one at  1630 post __________ , one at 1830 at church and the most severe episode  at 2130 while she was at home after church. On a couple occasions she  did take Tylenol without relief.  The last episode lasted a lot longer  than usual and felt to be worse in intensity thus her husband drove her  to the emergency room. After being treated with aspirin, IV heparin and  nitroglycerin, she has not had any further chest discomfort.  She denies  prior occurrences.  She does relay a history of a several week history  of dyspnea on exertion which was new for her.   PAST MEDICAL HISTORY:  No known drug allergies.   MEDICATIONS PRIOR TO ADMISSION:  1. Aciphex 20 mg daily.  2. Cozaar 50 mg daily.  3. Toprol XL 50 mg daily.  4. Tricor 145 daily.  5. Synthroid 200 mcg daily.  6. Two unknown creams that she was applying to  underneath her breasts.   PAST MEDICAL HISTORY:  Notable for hypertension.  She states that her  blood pressure usually runs in the 120s/40-60s at home. History of  hypothyroidism, history of diabetes since 1999, sugars usually ranges  less than 200.  She denies any __________  and her diabetes is diet  controlled.  She has a history of hyperlipidemia. unknown last check.  History of GERD and hiatal hernia.  She has had right knee, right heel  surgery, bilateral cataracts and status post hysterectomy secondary to  uterine cancer.  She specifically denies myocardial infarctions, CVA,  COPD or bleeding dyscrasia.   SOCIAL HISTORY:  She resides in [place] with her husband.  She has  three children, five grandchildren, five great grandchildren.  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 She denies any tobacco  history, alcohol, drug use, herbal medications.  She tries to maintain  an ADA diet.  She does  not exercise secondary to her knee problems.   FAMILY HISTORY:  Both mother and father are deceased in their 42s. Her  mother is status post bypass surgery and never fully recovered however,  she died from lung cancer.  She had a history of diabetes.  Her father  died in his sleep with a history of alcohol problems.  She has a brother  and sister that are both living. Her brother was hospitalized at one  point for meningitis for approximately 6 months resulting in hearing  loss and partial blindness.   REVIEW OF SYSTEMS:  Notable for a weight gain of approximately 30 pounds  in the last 3 years.  She does wear glasses and has a lower partial  plate.  She does admit to a rash under her breasts, history of  palpitations last year that was worked up by her primary care physician  and no specific diagnosis.  She does snore and based on her husband's  testimony probably has obstructive sleep apnea.  She does admit to  nocturia, postmenopausal, bilateral foot and knee arthralgias,  rare  loose stools,  GERD.  It is noted that she is scheduled for surgery on  her right fourth finger this Wednesday secondary to reoccurring cyst.   PHYSICAL EXAM:  GENERAL:  Well-nourished, well-developed, obese, white  female in no apparent distress accompanied by her husband.  Initial temperature is 97.5, five blood pressure 185/98, pulse 76,  respirations 22, 98% sat on room air. The current blood pressure is  118/50, pulse 71, respirations 18.  HEENT:  Grossly unremarkable.  NECK:  Supple without thyromegaly, adenopathy, JVD or carotid bruits.  CHEST:  Symmetrical excursion.  Breath sounds are diminished but clear  to auscultation.  HEART:  PMI is not displaced.  Heart sounds are distant but she has a  regular rate and rhythm.  I do not appreciate any murmurs, rubs, clicks  or gallops. I do not appreciate any abdominal or femoral bruits.  All  pulses are symmetrical and intact.  SKIN:  Integument is intact.  She does have moist erythremic areas under  both her breasts.  ABDOMEN:  Obese.  Bowel sounds present without organomegaly, masses or  tenderness.  EXTREMITIES:  No cyanosis, clubbing or edema.  MUSCULOSKELETAL:  Grossly unremarkable.  NEUROLOGIC:  Grossly unremarkable.   Chest x-ray shows cardiomegaly, no active disease, prominent right  hilum, opacity at medial left base, possibly a hiatal hernia, recommend  two view. EKG shows normal sinus rhythm, normal axis with a ventricular  rate of 72. PR interval is at 200. R-wave is early however she does not  have any acute changes and there were no old EKGs for comparison.   H&H is 10.9 and 32.5, normal indices, platelets 365, WBC is 8.2, sodium  137, potassium 4.4, BUN 24, creatinine 1.25, glucose 163, normal LFTs. D-  dimer was slightly elevated at 0.5.  CK-MB was 110/4.1.  Troponin was  slightly elevated at 0.23.  BNP was 97.   IMPRESSION:  1. Acute coronary syndrome with initial normal EKG but abnormal      troponin.   2. Hypertension.  3. Slightly elevated D-dimer.  4. Abnormal chest x-ray.  5. Normocytic anemia.  6. Obesity.  7. Probably yeast related rash under both breasts.  8. History as listed per past medical history.   DISPOSITION:  Will admit her to [facility] for further  evaluation.  We will continue the IV heparin and IV nitroglycerin that  started in the emergency room as well as her home medications.  We will  add aspirin to her  daily regimen, check our usual labs. To evaluate her chest discomfort, I  anticipate cardiac catheterization which can be done today. If her  catheterization does not show any evidence of coronary artery disease,  anticipate a CT scan to evaluate her slightly elevated D-dimer.  Future  plans will be based on findings.      Joellyn Rued, PA-C      [name]. [name], MD, [facility]  Electronically Signed    EW/MEDQ  D:  11/13/2006  T:  11/13/2006  Job:  [id number]   cc:   Gwen Pounds, MD  Cindee Salt, M.D.

## 2010-05-22 NOTE — Assessment & Plan Note (Signed)
OFFICE VISIT   Diana Wood, Diana Wood  DOB:  September 17, 1932                                        December 12, 2006  CHART #:  [id number]   CURRENT PROBLEM:  Status post coronary artery bypass grafting x3 on  November 17, 2006, by Dr. Laneta Simmers for severe three-vessel coronary  disease and subendocardial myocardial infarction.   HISTORY OF PRESENT ILLNESS:  Ms. Nee returns for her first office visit  after being discharged from the hospital for urgent 3-vessel coronary  revascularization.  In brief, she is a 77 year old, obese, diabetic  female who presented with acute chest pain and positive cardiac enzymes.  Cardiac catheterization showed severe 3-vessel disease with an EF of  60%.  She underwent bypass grafts including the left IMA to the LAD and  vein grafts to the diagonal, OM, and posterior descending.  Postoperatively, she did well and remained in sinus rhythm.  She was  discharged home on the fifth postop day on Toprol XL 25 mg, TriCor,  Lipitor, Synthroid, Glucophage, Januvia, and a short course of Lasix and  potassium.  Since returning home, she has had no recurrent angina,  and  the surgical incisions are healing well.  She has noted difficulty with  knee pain and muscle soreness on the Lipitor and states the medicine has  made her legs weak.   PHYSICAL EXAMINATION:  VITAL SIGNS:  Blood pressure 130/80,  pulse 90,  respirations 18, saturation 97% on room air.  GENERAL:  She is alert and oriented.  LUNGS:  Breath sounds are clear and equal.  CHEST:  The sternum is stable, well healed.  CARDIAC:  Rhythm is regular, and there is no S3, gallop, or murmur.  EXTREMITIES:  The leg incisions are healing well, and there is minimal  ankle edema.  NEUROLOGIC:  Exam is intact.   PA and lateral chest x-ray reveals clear lung fields, no significant  pleural effusion, a stable cardiac silhouette, and the sternal wires are  well aligned.   IMPRESSION AND PLAN:  The  patient is recovering well now 4 weeks after  surgery.  She was told she could resume driving and start the outpatient  rehab program.  She will continue her medications under the direction of  Dr. [name] and Dr. Timothy Lasso.  She knows not to lift more than 20 pounds  until 3 months after surgery and to try to take a 20-minute walk daily.  She will return here as needed.   Kerin Perna, M.D.  Electronically Signed   PV/MEDQ  D:  12/12/2006  T:  12/13/2006  Job:  [id number]   cc:   [name]. [name], MD, [facility]  Gwen Pounds, MD

## 2010-05-22 NOTE — Consult Note (Signed)
NAMELAWANDA, HOLZHEIMER                 ACCOUNT NO.:  [id number]   MEDICAL RECORD NO.:  [id number]          PATIENT TYPE:  INP   LOCATION:  2921                         FACILITY:  [facility]   PHYSICIAN:  Evelene Croon, M.D.     DATE OF BIRTH:  01-05-1933   DATE OF CONSULTATION:  11/14/2006  DATE OF DISCHARGE:                                 CONSULTATION   REFERRING PHYSICIAN:  Dr. Charlton Haws.   REASON FOR CONSULTATION:  Severe 3-vessel coronary artery disease status  post non-ST segment MI.   CLINICAL HISTORY:  I was asked by Dr. Charlton Haws to evaluate this 77-  year-old patient with severe 3-vessel coronary artery disease for  consideration of coronary artery bypass graft surgery.  She has a  history of diabetes, hypertension, and hypothyroidism, and developed  acute onset of anterior chest pressure radiating into her left arm and  jaw at around noon on November 13, 2006 while watching TV.  She describes  this as 10/10 chest pain, and it lasted about 30 minutes.  She had 3  subsequent episodes during the afternoon and evening, and was brought to  the emergency room by her husband.  She reports no episodes of chest  pain or pressure prior to this, but has had several months of fatigue  and tiredness.  She ruled in for non-ST segment elevation MI with an  initial troponin of 0.11, increasing to 0.67.  Her initial CPK was 110,  with MB of 4.1, and it increased to 97 with MB of 6.3.  Electrocardiogram showed no significant changes.   She was taken to the cardiac catheterization lab, and underwent left  heart catheterization showing severe 3-vessel coronary artery disease.  The LAD has 90% mid-vessel stenosis just after the take-off of a  moderate-sized diagonal branch.  The diagonal itself had proximal  stenosis at the ostium.  The left circumflex had a single large marginal  branch that had 90% stenosis.  The right coronary artery had 99%  stenosis before the take-off of the posterior  descending branch.  Left  ventricular ejection fraction was about 60%.  There was no gradient  across the aortic valve and no mitral regurgitation.   REVIEW OF SYSTEMS:  GENERAL:  She denies any fever or chills.  She has  had about 30 pounds of weight gain over the past 3 years.  She does  report fatigue over the past several months.  EYES:  She wears glasses.  ENT:  She wears lower partial denture.  ENDOCRINE:  She has adult-onset  diabetes followed by Dr. Timothy Lasso.  Her last hemoglobin A1c was 7.1.  She  has history of hypothyroidism.  CARDIOVASCULAR:  As above.  She has  recent onset of chest pain.  She reports dyspnea on exertion for several  weeks.  She has had no PND or orthopnea.  She denies peripheral edema.  RESPIRATORY:  She denies cough and sputum production.  She has had no  hemoptysis.  GI:  She denies nausea and vomiting.  She has had no melena  or bright red blood per  rectum.  GU:  She denies dysuria and hematuria.  She has had nocturia.  NEUROLOGIC:  She denies any focal weakness or  numbness.  She denies dizziness and syncope.  MUSCULOSKELETAL:  She does  have degenerative arthritis of both knees, ankles and feet.  She has  undergone multiple surgeries for this.  ALLERGIES:  None.  PSYCHIATRIC:  Negative.  HEMATOLOGIC:  Negative.   PAST MEDICAL HISTORY:  Significant for hypertension, hyperlipidemia, and  hypothyroidism.  She has history of gastroesophageal reflux disease.  Previous surgery includes arthroscopy on both knees.  She had surgery on  both ankles and heels to remove bone spurs.  She has history of  degenerative arthritis of both knees, and has been told that she had  bone on bone and nothing further can be done other than knee  replacement.  She has history of bilateral cataracts.  She is status  post hysterectomy for uterine cancer and had no additional treatment for  that.  She did have history of right leg DVT treated with Coumadin  several years ago, and has  also had surgery on her right hand in the  past, as well as a finger on her left hand.   SOCIAL HISTORY:  She lives in [place] with her husband.  She has 3  children.  She is retired.  She has never smoked, and denies alcohol  abuse.   FAMILY HISTORY:  Her mother died at age 45 of lung cancer within a year  after undergoing coronary bypass surgery.  Her father died in his 92s of  unknown causes.   MEDICATIONS:  Prior to admission, AcipHex 20 mg daily, Cozaar 50 mg  daily, Toprol XL 50 mg daily, Tricor 145 mg daily, and Synthroid 200 mcg  daily.   PHYSICAL EXAMINATION:  VITAL SIGNS:  Blood pressure 118/58, pulse 81 and  regular.  Respiratory rate 20 and unlabored.  Her height is 61 inches,  and her weight is 80.9 kg.  GENERAL:  She is an obese white female in no distress.  HEENT:  Normocephalic, atraumatic.  Pupils are equal, reactive to light  and accommodation.  Extraocular muscles are intact.  The throat is  clear.  NECK:  Normal carotid pulses bilaterally.  There are no bruits.  There  is no adenopathy or thyromegaly.  CARDIAC:  Exam shows regular rate and rhythm with normal S1 and S2.  There is no murmur, rub, or gallop.  LUNGS:  Clear.  ABDOMEN:  Shows active bowel sounds.  The abdomen is soft, obese, and  nontender.  There are no palpable masses or organomegaly.  EXTREMITIES:  No peripheral edema.  Pedal pulses are strongly palpable  bilaterally.  SKIN:  Warm and dry.  NEUROLOGIC:  Exam shows her to be alert and oriented x3.  Motor and  sensory exams are grossly normal.   LABORATORY EXAMINATION:  At the time of admission showed normal  electrolytes with BUN 24, creatinine 1.25.  Glucose was 163.  Hemoglobin  10.9, hematocrit 32.5, platelets 365,000.  White blood cell count 8.2.  Her liver function profile was within normal limits with albumin 3.7.  Carotid Doppler examination shows no evidence of internal carotid artery  stenosis.   IMPRESSION:  Ms. Chapdelaine has severe  3-vessel coronary artery disease,  presenting with non-ST segment elevation myocardial infarction.  She did  have some chest pain after her catheterization, an was started on  heparin and nitroglycerin.  I agree that coronary artery bypass graft  surgery  is the best treatment for this patient.  I discussed the  operative procedure with her and her family, including alternatives,  benefits, and risks including, but not limited to, bleeding, blood  transfusion, infection, stroke, myocardial infarction, graft failure,  and death. They understand, and would like to proceed with surgery.  We  will plan to do her surgery on Monday, November 17, 2006.  She should  remain on heparin and nitroglycerin until that time.      Evelene Croon, M.D.  Electronically Signed     BB/MEDQ  D:  11/14/2006  T:  11/15/2006  Job:  [id number]   cc:   [name]. [name], MD, [facility]  Gwen Pounds, MD  Evelene Croon, M.D.

## 2010-05-22 NOTE — Assessment & Plan Note (Signed)
Diana Wood                            CARDIOLOGY OFFICE NOTE   Diana Wood, Diana Wood                        MRN:          [id number]  DATE:03/31/2007                            DOB:          03/30/1932    Diana Wood returns today in follow up status post CABG in November.  She  has been doing fairly well.  She denies any chest pain.  She went  through cardiac rehab without a problem.  She has been compliant with  her meds.  Her risk factors included hypercholesterolemia, hypertension,  and diabetes.   REVIEW OF SYSTEMS:  Remarkable for some stumbling and difficulty with  her coordination.  She also feels that her memory is quite poor,  particularly short-term.  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  Explained to her that sometimes when people have their  cardiac output sent through the bypass machine perioperatively they can  have some subtle neurological changes, possible personality changes, and  also memory changes.   I told her I thought it would be reasonable to check a CT scan to make  sure there has been no major embolic or watershed events.   CURRENT MEDICATIONS:  1. Include Aciphex 20 a day.  2. Synthroid 200 mcg a day.  3. Glucophage 500 b.i.d.  4. An aspirin a day.  5. Omeprazole 20 a day.  6  Tricor 140 mg a day.  1. Toprol 25 a day.   EXAM:  Remarkable for blood pressure 130/70, pulse 70 and regular,  afebrile, respiratory rate 14.  HEENT:  Unremarkable.  Carotids are without bruit, no lymphadenopathy, thyromegaly JVP  elevation.  LUNGS:  Clear with good diaphragmatic motion.  No wheezing.  S1-S2 with normal heart sounds.  The sternum well-healed.  PMI normal.  ABDOMEN:  Benign.  Bowel sounds positive.  No AAA.  No  hepatosplenomegaly or hepatojugular reflux.  No bruits.  Distal pulses intact.  No edema, status post endoscopic vein harvest  bilaterally with small scars medially below the knee.  NEURO:   Nonfocal.  SKIN:  Warm and dry.  No muscular weakness.   EKG shows sinus rhythm with no acute changes, poor R wave progression.   IMPRESSION:  1. Coronary disease, previous CABG.  Continue aspirin, beta-blocker.  2. 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  May be slow to resolve.  Check head CT to rule      out major vascular event and/or watershed infarct.  However, I      think this is clearly related to being on her bypass machine.  3. Hypercholesterolemia.  Continue Tricor she previously has been on a      statin drug.  I will have the records from Dr. Timothy Wood to see if this      was changed.  4. Diabetes.  Check hemoglobin A1c quarterly.  I talked to her at      length about how important it is to control her sugar in regards to  her grafts lasting.  5. Hypertension currently well controlled.  Continue current      medications.  Since she is a diabetic, I will leave it up to Dr.      Timothy Wood to see if she needs to be on ACE inhibitor for renal      protection.  I will see her back in 6 months.     Diana Wood. Diana Emms, MD, [facility]  Electronically Signed    PCN/MedQ  DD: 03/31/2007  DT: 03/31/2007  Job #: [id number]   cc:   Diana Pounds, MD

## 2010-05-22 NOTE — Op Note (Signed)
[name], Wood                 ACCOUNT NO.:  [id number]   MEDICAL RECORD NO.:  [id number]          PATIENT TYPE:  AMB   LOCATION:  DSC                          FACILITY:  [facility]   PHYSICIAN:  Cindee Salt, M.D.       DATE OF BIRTH:  1932-03-07   DATE OF PROCEDURE:  12/22/2007  DATE OF DISCHARGE:                               OPERATIVE REPORT   PREOPERATIVE DIAGNOSIS:  Degenerative arthritis with a recurrent mucoid  cyst right ring finger.   POSTOPERATIVE DIAGNOSIS:  Degenerative arthritis with a recurrent mucoid  cyst right ring finger.   OPERATION:  Excision cyst debridement fusion distal interphalangeal  joint right ring finger with Mini Acutrak screw 20 mm in length.   SURGEON:  Cindee Salt, MD   ASSISTANT:  Diana Wood R.N.   ANESTHESIA:  General.   ANESTHESIOLOGIST:  Bedelia Person, MD   HISTORY:  The patient is a 77 year old female with a history of a  recurrent mucoid cyst on her right ring finger distal interphalangeal  joint.  She has had this removed but with significant degenerative  changes she has had a recurrent.  She has elected to proceed to have  this excised with a fusion of the distal interphalangeal joint and up to  prevent further pain and recurrence.  She has elected to proceed to have  this done.  She is aware of risks and complications including infection,  recurrence injury to arteries, nerves, tendons, incomplete relief of  symptoms and dystrophy.  In the preoperative area, the patient is seen.  The extremity marked by both the patient and surgeon.  Questions again  encouraged and answered.  She is aware of the potential for nail  problems, nonunion, antibiotic is given.   DESCRIPTION OF PROCEDURE:  The patient is taken to the operating room  where a general endotracheal intubation anesthesia was carried out under  the direction of Dr. Gypsy Balsam.  She was prepped using DuraPrep, supine  position, right arm free.  A time-out was taken.  The arm was  exsanguinated  with an Esmarch bandage.  Tourniquet placed high and the  arm was inflated 250 mmHg.  A curvilinear incision was made over the  distal interphalangeal joint and carried down through the subcutaneous  tissue.  Cystic structures were identified and protected.  These were  excised and sent to pathology.  The joint was then opened making an  incision through the extensor tendon proximal to its insertion.  A large  osteophyte was present dorsally.  This was removed.  The joint was  identified with a Therapist, nutritional.  The collateral ligaments were then  cut.  This allowed the joint to be opened in a shotgun manner.  The  remainder of any articular cartilage was removed.  Significant  degenerative changes were present.  Removal was done with a synovial  rongeur.  The articular surface was then removed from the distal  phalanx.  This was done in a 10-trough method.  A 28-gauge K-wire was  then inserted in an antegrade manner through the distal phalanx for  position and  placement of the Acutrak screw.  X-rays in the AP and  lateral direction revealed that this was adequately placed and centrally  located in the distal phalanx.  A drill hole was then made using a 28-  gauge wire into the middle phalanx.  The K-wire guidepin was then  advanced across the joint after maintaining in position.  X-rays  confirmed that the guidepin was centrally located in both distal and  middle phalanges.  The reamer was then placed over the guidepin and this  was used to ream both the middle and distal phalanx.  A 20-mm Acutrak  screw was then selected.  This was a mini size.  This was then inserted  and advanced under image intensification so that it crossed the joint  firmly compressing the distal interphalangeal joint site  into firm  compression.  Rotatory alignment was maintained.  The finger was placed  through full flexion/extension of the PIP MP joint.  No angulation or  overlap was noted between any of the  fingers.  X-rays confirmed  positioning of the screw.  This was then copiously irrigated with  saline.  The extensor tendon was then repaired with a 5-0 Vicryl Rapide  sutures as were the skin incisions.  A sterile compressive dressing and  splint to the finger was applied.  Bleeders were cauterized with  bipolar.  A sterile compressive dressing and splint applied and the  patient was taken to the recovery room for observation in satisfactory  condition.  She will be discharged home to return to the [facility] in 1 week on Vicodin.           ______________________________  Cindee Salt, M.D.     GK/MEDQ  D:  12/22/2007  T:  12/22/2007  Job:  [id number]   cc:   Gretta Cool, M.D.

## 2010-05-22 NOTE — Assessment & Plan Note (Signed)
[facility]                            CARDIOLOGY OFFICE NOTE   Diana Wood, Diana Wood                        MRN:          [id number]  DATE:05/15/2007                            DOB:          08-Feb-1932    Diana Wood was seen today as an add-on.  She had called the triage nurse  regarding chest pain.  The pain has been going on since 4:30 a.m. this  morning.  It is totally atypical.  It is not exertional.  It is a sharp  pain on the left side of her chest.  Sometimes she compresses it and it  feels better.  She is on Aciphex and omeprazole, was not sure why she  was on both.  I told her to stop the omeprazole.  She tried antacids.  There was no change in her symptoms.  She insists on being seen today  before the weekend.   The patient is a diabetic.  She had bypass surgery in November 2008.   She has good LV function.   The patient has not had nitroglycerin.   Risk factors otherwise well controlled.  She has not had any undue  change in her diet.  Her sugars seem to be well controlled.  Other than  stopping the omeprazole, her medications have not been changed.  She has  not had any recent trauma.  There is no history of DVT or PE.   REVIEW OF SYSTEMS:  Otherwise remarkable for one isolated episode of  hesitant speech.  Her carotids were normal prior to CABG and there were  no other signs of a TIA.   MEDICATIONS:  1. Aciphex 20 a day.  2. Synthroid 200 mcg a day.  3. Glucophage 500 b.i.d.  4. Omeprazole 20 daily.  5. Tricor 145 mcg a day.  6. Toprol-XL 25 a day.  7. Synthroid 300 mcg daily.   EXAMINATION:  Remarkable for a healthy-appearing middle-aged white  female in no distress.  She has lot of jewelry on.  Weight is 170, blood  pressure is 150/80, pulse 75 and regular, afebrile, respiratory rate 14.  HEENT:  Unremarkable.  Carotids are normal without bruit.  No  lymphadenopathy, thyromegaly or JVP elevation.  No pain to palpation in  the  chest.  LUNGS:  Clear, good diaphragmatic motion.  No wheezing.  S1, S2 with no  rub.  The sternum appears well healed.  PMI not palpable.  ABDOMEN:  Benign.  Bowel sounds positive.  No AAA, no tenderness, no  hepatosplenomegaly or hepatojugular reflux.  DISTAL PULSES:  Intact, no edema.  NEURO:  Nonfocal.  SKIN:  Warm and dry.  No muscular weakness.   EKG is totally normal.   IMPRESSION:  1. Atypical chest pain in a diabetic status post coronary artery      bypass grafting in November.  Follow-up stress Myoview to rule out      early graft failure.  Continue aspirin therapy.  2. Question inflammation in the chest either from sternotomy or other.      Trial of Mobic 6.5 mg once  a day.  Follow up in 3-4 weeks.  3. Diabetes.  Follow up with primary care MD.  I believe she sees      [facility].  Continue current dose of Glucophage.  4. History of reflux.  Pain does not seem to be related.  No help with      antacids.  No need to be on both Aciphex and omeprazole.  Since      omeprazole could have interactions with antiplatelet agents I      prefer to be on AcipHex.  5. Hypothyroidism.  Continue Synthroid 300 mcg a day.  TSH and T4      quarterly.  6. Hyperlipidemia, currently on TriCor.  Likely hypertriglyceridemia      in the setting of diabetes.  Continue low-fat diet.  Check lipid      and liver profile in 3 months.   Again, her pain is atypical.  She looks good and her EKG was totally  normal.  I did not see any reason to send the person to the hospital.     Theron Arista C. [name], MD, [facility]  Electronically Signed    PCN/MedQ  DD: 05/15/2007  DT: 05/15/2007  Job #: [id number]

## 2010-05-22 NOTE — Op Note (Signed)
NAMEDOLOREZ, JEFFREY                 ACCOUNT NO.:  [id number]   MEDICAL RECORD NO.:  [id number]          PATIENT TYPE:  INP   LOCATION:  2313                         FACILITY:  [facility]   PHYSICIAN:  Evelene Croon, M.D.     DATE OF BIRTH:  05/18/1932   DATE OF PROCEDURE:  11/17/2006  DATE OF DISCHARGE:                               OPERATIVE REPORT   PREOPERATIVE DIAGNOSIS:  Severe three-vessel coronary disease.   POSTOPERATIVE DIAGNOSIS:  Severe three-vessel coronary disease.   OPERATIVE PROCEDURES:  1. Median sternotomy.  2. Extracorporeal circulation.  3. Coronary artery bypass graft surgery x4 using a free left internal      mammary artery graft to the left anterior descending coronary      artery, with a saphenous vein graft to the diagonal branch of the      left anterior descending artery, a saphenous vein graft to the      obtuse marginal branch of the left circumflex coronary artery, and      a saphenous vein graft to the posterior descending branch of the      right coronary artery.  4. Endoscopic vein harvesting from both thighs.   ATTENDING SURGEON:  Evelene Croon, M.D.   ASSISTANT:  Coral Ceo, P.A.   ANESTHESIA:  General endotracheal.   CLINICAL HISTORY:  This patient is a 77 year old obese, diabetic woman  who presented with new-onset anterior chest pain radiating into her left  arm and jaw.  She had a peak troponin of 0.67 and a peak CPK of 97 with  an MB of 6.3.  Cardiac catheterization showed severe three-vessel  disease with a 90% LAD diagonal bifurcation stenosis, 90% obtuse  marginal stenosis, and a 99% distal right coronary artery stenosis  before the posterior descending branch.  Left ventricular ejection  fraction was normal at 60%.  After review of the angiogram and  examination of the patient, it was felt that coronary artery bypass  graft surgery was the best treatment to prevent further ischemia and  infarction.  I discussed the operative procedure  with the patient and  her family.  We discussed the alternatives, benefits, and risks  including but not limited to bleeding, blood transfusion, infection,  stroke, myocardial infarction, graft failure, and death.  They  understood and agreed to proceed.   OPERATIVE PROCEDURE:  The patient was taken to the operating room and  placed on the table in supine position.  After induction of general  endotracheal anesthesia, a Foley catheter was placed in the bladder  using sterile technique.  Then the chest, abdomen and both lower  extremities were prepped and draped in the usual sterile manner.  The  chest was entered through a median sternotomy incision and the  pericardium opened in the midline.  Examination of the heart showed good  ventricular contractility.  The ascending aorta had no palpable plaques  in it.   Then the left internal mammary artery was harvested from the chest wall  as a pedicle graft.  This is a medium-caliber vessel proximally and in  the midportion but distally  was quite small.  Her sternum was fairly  short and I was concerned about the flow through the small distal  __________ area.  At the same time a segment of greater saphenous vein  was harvested from the right leg using endoscopic vein harvest  technique.  This vein was of medium size and good quality but below the  knee divided into three small branches.  Therefore, we had to harvest a  second segment from the left thigh using endoscopic vein harvest  technique.  This vein was of medium size and good quality.   Then the patient was heparinized and when an adequate activated clotting  time was achieved, the distal ascending aorta was cannulated using a 20-  [place] aortic cannula for arterial inflow.  Venous outflow was achieved  using a two-stage venous cannula through the right atrial appendage.  An  antegrade cardioplegia and vent cannula was inserted in the aortic root.   The patient was placed on  cardiopulmonary bypass and the distal coronary  arteries were identified.  The LAD was intramyocardial in its proximal  and midportions.  It was located distally, where it was a fairly small  but graftable vessel.  The diagonal branch was a large, graftable  vessel.  The left circumflex had a single large obtuse marginal branch  and it was graftable.  The right coronary artery gave off a single large  posterior descending branch that had some proximal disease in it.   Then the aorta was crossclamped and 500 mL of cold blood antegrade  cardioplegia was administered in the aortic root with quick arrest the  heart.  Systemic hypothermia to 28 degrees centigrade and topical  hypothermia with iced saline was used.  A temperature probe was placed  in the septum and an insulating pad in the pericardium.   The first distal anastomosis was performed to the posterior descending  coronary artery.  The internal diameter of this vessel was about 1.75  mm.  The conduit used was a segment of greater saphenous vein and the  anastomosis performed in an end-to-side manner using continuous 7-0  Prolene suture.  Flow was noted through the graft and was excellent.   Then the second distal anastomosis was performed to the obtuse marginal  branch.  The internal diameter was 1.75 mm.  The conduit used was a  second segment of greater saphenous vein and the anastomosis performed  in a end-to-side manner using continuous 7-0 Prolene suture.  Flow was  noted through the graft and was excellent.  Then another dose of  cardioplegia was given down the vein grafts and in the aortic root.   The third distal anastomosis was performed to the diagonal branch.  The  internal diameter was about 1.6 mm.  The conduit used was a third  segment of greater saphenous vein and the anastomosis was performed in  an end-to-side manner continuous 7-0 Prolene suture.  Flow was noted  through the graft and was excellent.   The  fourth distal anastomosis was performed to the distal LAD.  The  internal diameter was about 1.6 mm here.  The left internal mammary  graft was brought into the field and the flow checked again and was  suboptimal.  The distal vessel was fairly small.  This was trimmed back  to a suitable-caliber artery and the flow was excellent at this point.  Unfortunately, the left internal mammary graft was not long enough to  reach the distal LAD as  a pedicle graft, and therefore I decided to use  it as a free graft.  The left internal mammary pedicle was divided  proximally and the stump oversewn with a 2-0 silk suture ligature.  Then  the free left internal mammary graft was anastomosed to the distal LAD  in an end-to-side manner using continuous 8-0 Prolene suture.  Flow was  checked through the graft and was excellent.   Then another dose of cardioplegia given down the vein grafts and in the  aortic root.  The three proximal vein graft anastomoses were performed  to the aortic root in an end-to-side manner continuous 6-0 Prolene  suture.  Then the proximal anastomosis of the left internal mammary  graft was performed to the proximal portion of the diagonal vein graft  in an end-to-side manner using continuous 7-0 Prolene suture.  Then the  patient was rewarmed to 37 degrees centigrade.  The clamp was removed  from the aorta with a total crossclamp time of 91 minutes.  There was  spontaneous return of ventricular fibrillation and the patient was  defibrillated into sinus rhythm.  The proximal and distal anastomoses  appeared hemostatic and the alignment of the grafts satisfactory.  Graft  markers were placed around the proximal anastomoses.  Two temporary  right ventricular and right atrial pacing wires were placed and brought  out through the skin.   When the patient had rewarmed to 37 degrees centigrade, she was weaned  from cardiopulmonary bypass on no inotropic agents.  Total bypass time   was 107 minutes.  Cardiac function appeared excellent with a cardiac  output of 5 L/min.  Protamine was given and the venous and aortic  cannulas were removed without difficulty.  Hemostasis was achieved.  Three chest tubes were placed with a tube in the posterior pericardium,  one in the left pleural space and one in the anterior mediastinum.  The  pericardium was loosely reapproximated over the heart.  The sternum was  closed with double #6 stainless steel wires.  The fascia was closed with  continuous #1 Vicryl suture.  The subcutaneous tissue was closed with  continuous 2-0 Vicryl suture and the skin with a 3-0 Vicryl subcuticular  closure.  The lower extremity vein harvest site was closed in layers in  a similar manner.  The sponge, needle and instrument counts were correct  according to the scrub nurse.  Dry sterile dressings were applied over  the incisions and around the chest tubes, which were hooked to Pleur-  Evac suction.  The patient remained hemodynamically stable and was  transported to the SICU in guarded but stable condition.      Evelene Croon, M.D.  Electronically Signed     BB/MEDQ  D:  11/17/2006  T:  11/18/2006  Job:  [id number]   cc:   [name]. [name], MD, [facility]

## 2010-05-22 NOTE — Assessment & Plan Note (Signed)
[facility]                            CARDIOLOGY OFFICE NOTE   Diana, Wood                        MRN:          [id number]  DATE:06/08/2007                            DOB:          Apr 23, 1932    Ms. Diana Wood returns today for followup.  I saw her as an add-on for  chest pain on May 8.  It was atypical; I thought it was musculoskeletal.  She has had some improvement with Mobic but still has occasional pains  that continue to be atypical twinges in her chest. She had a stress  Myoview study on May 18, 2007, which was essentially normal with a good  EF. Her EKG has not had any changes.   I told Sugar that I thought her heart was fine.  However, I would like  to do a noncontrast CT scan to make sure her sternotomy is healed well  and there are no anomalies with a grafts.   We will try to get this done this week.   Otherwise, she has been doing well.  She saw Dr. Timothy Lasso recently. Her  hemoglobin A1c is 6.2.   She has otherwise been compliant with her medications.   She has not any significant PND or orthopnea.  There has been no lower  extremity edema.  She does get some exertional dyspnea.   CURRENT MEDICATIONS:  1. Aciphex 20 a day.  2. Mobic 7.5 a day.  3. Synthroid 200 mcg a day.  4. Glucophage 500 b.i.d.  5. Metrazol 20 a day.  6. Tricor 145 a day.  7. Toprol 25 a day.   PHYSICAL EXAMINATION:  GENERAL:  Her exam is remarkable for an  overweight white female in no distress.  Affect appropriate.  VITAL SIGNS:  Blood pressure is 150/80, pulse 74 and regular, weight  172, afebrile.  HEENT:  Unremarkable.  NECK:  Carotids normal without bruit. No lymphadenopathy, thyromegaly  JVP elevation.  LUNGS:  Clear with good diaphragmatic motion.  No wheezing.  CARDIAC:  S1-S2 with normal heart sounds. PMI normal.  MUSCULOSKELETAL:  No pain to palpation with multiple upper extremity  motions including abduction and adduction.  ABDOMEN:   Benign.  Bowel sounds positive.  No AAA, no bruit, no  hepatosplenomegaly or hepatojugular reflux.  No tenderness.  EXTREMITIES:  Distal pulses intact.  No edema.  NEUROLOGIC:  Nonfocal.  No weakness.  SKIN:  Warm and dry.   EKG is normal with poor R wave progression.   IMPRESSION:  1. Chest pain, doubt cardiac etiology.  Follow up noncontrast CT scan,      Myoview low risk.  2. Hypercholesterolemia.  Continue Tricor 145 mcg a day, low-caloric      diet.  3. History of reflux.  Continue omeprazole 20 a day.  4. Diabetes.  Follow up with Dr. Timothy Lasso.  Continue metformin.      Hemoglobin A1c in a good range.   Overall, I think the patient is stable.  I do not think her coronaries  have had premature graft failure.  I will see her back  in 6 months as  long as her chest CT is low risk.     Diana Wood. [name], MD, [facility]  Electronically Signed    PCN/MedQ  DD: 06/08/2007  DT: 06/08/2007  Job #: [id number]

## 2010-05-25 NOTE — Op Note (Signed)
Diana Wood, Diana Wood                 ACCOUNT NO.:  [id number]   MEDICAL RECORD NO.:  [id number]          PATIENT TYPE:  AMB   LOCATION:  DSC                          FACILITY:  [facility]   PHYSICIAN:  Mila Homer. Sherlean Foot, M.D. DATE OF BIRTH:  1932/01/12   DATE OF PROCEDURE:  10/09/2005  DATE OF DISCHARGE:                                 OPERATIVE REPORT   SURGEON:  Mila Homer. Sherlean Foot, M.D.   ASSISTANT:  None.   ANESTHESIA:  General mask and general LMA.   PREOPERATIVE DIAGNOSES:  Right knee medial meniscus tear.  Osteoarthritis.   POSTOPERATIVE DIAGNOSES:  Right knee medial meniscus tear.  Osteoarthritis.   PROCEDURES:  Right knee arthroscopy with partial medial meniscectomy.  Microfracture of the medial compartment.  Chondroplasty in the  patellofemoral and medial compartments.  Informed consent was obtained.   INDICATIONS FOR PROCEDURE:  Patient is a 77 year old with mechanical  symptoms.  MRI with evidence of meniscus tearing.  Informed consent  obtained.   DESCRIPTION OF PROCEDURE:  The patient was laid supine, administered general  anesthesia.  The right leg was prepped and draped in the usual sterile  fashion.  Inferolateral and inferomedial portals were created with a #11  blade, blunt trocar and cannula.  Diagnostic arthroscopy revealed  chondromalacia of the patellofemoral joint.  This was debrided with the  [facility] shaver.  I went into the medial compartment.  This had a very  complex posterior horn medial meniscus tear.  Partial medial meniscectomy  was performed with the straight basket forceps and the [employer].  I then performed a chondroplasty.  There were a couple of areas of eburnated  bone.  I did a microfracture following the chondroplasty on a 2 x 2 cm area  of the medial femoral condyle.  ACL and PCL were normal.  The lateral  compartment was normal.  I then lavaged the knee, toured all three  compartments again and ensured there were no loose bodies.  I  then closed  with 4-0 nylon sutures, dressed with Xeroform dressing with sponges, sterile  Webril and Ace wrap.   COMPLICATIONS:  None.   DRAINS:  None.   ESTIMATED BLOOD LOSS:  Minimal.           ______________________________  Mila Homer. Sherlean Foot, M.D.    SDL/MEDQ  D:  10/09/2005  T:  10/09/2005  Job:  [id number]

## 2010-05-25 NOTE — Op Note (Signed)
[facility]  Patient:    Diana Wood, Diana Wood Visit Number: [id number] MRN: [id number]          Service Type: DSU Location: [facility] Attending Physician:  Cordella Register Dictated by:   Nicki Reaper, M.D. Proc. Date: 04/07/01 Admit Date:  02/16/2001 Discharge Date: 02/16/2001                             Operative Report  PREOPERATIVE DIAGNOSIS:  Mucoid cysts, left middle finger.  POSTOPERATIVE DIAGNOSIS:  Mucoid cysts, left middle finger.  OPERATION:  Excision of mucoid cysts, left middle finger; debridement of distal interphalangeal joint.  SURGEON:  Nicki Reaper, M.D.  ASSISTANT:  Joaquin Courts, R.N.  ANESTHESIA:  Forearm-based IV regional.  HISTORY:  The patient is a 77 year old female with a history of an enlarging mass and deformity of her nail plate, left middle finger.  DESCRIPTION OF PROCEDURE:  The patient was brought to the operating room where a forearm-based IV regional anesthetic was carried out without difficulty. She was prepped and draped using Betadine scrubbing solution with left arm free.  A curvilinear incision was made over the distal interphalangeal joint, carried down onto the middle phalanx, carried down through subcutaneous tissue and bleeders were electrocauterized.  The cysts were immediately apparent -- there were two in nature -- sitting over the nail matrix.  With blunt and sharp dissection, these were dissected free and sent to pathology.  The margins of the extensor tendon were then isolated, incision made, the joint opened.  Significant osteophytes were present.  With a micro-rongeur, these were removed, smoothing the entire dorsal cortex, dorsal portion of the middle phalanx.  The extensor tendon was left intact.  The wound was copiously irrigated with saline.  The skin was closed with interrupted 5-0 nylon sutures.  A sterile compressive dressing and splint to the finger were applied.  The patient tolerated  the procedure well and was taken to the recovery room for observation in satisfactory condition.  She is discharged home to return to the [facility] in one week on Vicodin and Keflex. Dictated by:   Nicki Reaper, M.D. Attending Physician:  Cordella Register DD:  04/07/01 TD:  04/07/01 Job: [id number]

## 2010-05-25 NOTE — Op Note (Signed)
Diana Wood, Diana Wood                 ACCOUNT NO.:  [id number]   MEDICAL RECORD NO.:  [id number]          PATIENT TYPE:  AMB   LOCATION:  DSC                          FACILITY:  [facility]   PHYSICIAN:  Cindee Salt, M.D.       DATE OF BIRTH:  June 14, 1932   DATE OF PROCEDURE:  08/15/2005  DATE OF DISCHARGE:                                 OPERATIVE REPORT   PREOPERATIVE DIAGNOSIS:  Mucoid cyst, right ring finger.   POSTOPERATIVE DIAGNOSIS:  Mucoid cyst, right ring finger.   OPERATION:  Excision cyst, debridement distal interphalangeal joint,  rotation dorsal flap, right ring finger.   SURGEON:  Cindee Salt, M.D.   ASSISTANT:  Carolyne Fiscal R.N.   ANESTHESIA:  IV regional.   HISTORY:  The patient is a 77 year old female with a history of a large cyst  on the dorsal aspect right ring finger distal interphalangeal joint.  The  skin is now translucent and it is significantly enlarged since being seen.  She is admitted for resection of the cyst, debridement of the joint and  possible rotation dorsal flap for coverage. In the preoperative area,  questions were encouraged and answered.  The area marked by both the patient  and surgeon.  Antibiotic given.   PROCEDURE:  The patient was brought to the operating room where a forearm  based IV regional anesthetic was carried out without difficulty.  She was  prepped and draped using DuraPrep, supine position, right ring finger free.  A curvilinear incision was made over the mass.  This immediately entered the  mass on making an incision in the skin.  The flap of tissue was then  elevated.  A significant area of dorsal skin showed no circulation and it  was decided to proceed with a dorsal rotation flap.  The cyst was then  bluntly dissected free.  The joint opened and the cyst removed and sent to  pathology.  Osteophytes on both radial and ulnar aspect of the distal  interphalangeal joint were removed with a small rongeur.  The incision was  then proceeded  proximally across the dorsum of the finger just distal to the  PIP joint.  The translucent skin was excised and the dorsal flap was then  easily rotated to cover the area.  The wound was then irrigated.  The skin  closed with rotation of flap with interrupted 5-0 nylon sutures.  A sterile  compressive dressing and splint was applied.  The patient tolerated the  procedure well and was taken to the recovery observation in satisfactory  condition.  She is discharged home to return to the [facility] in 1 week on Vicodin.           ______________________________  Cindee Salt, M.D.     GK/MEDQ  D:  08/15/2005  T:  08/15/2005  Job:  [id number]

## 2010-05-25 NOTE — Op Note (Signed)
[place]  Patient:    Diana Wood, Diana Wood Visit Number: [id number] MRN: [id number]          Service Type: DSU Location: [facility] Attending Physician:  Cordella Register Dictated by:   Cordella Register, D.P.M. Proc. Date: 02/16/01 Admit Date:  02/16/2001                             Operative Report  SURGEON:  Cordella Register, D.P.M.  PREOPERATIVE DIAGNOSIS:  Plantar fasciitis, chronic, major, right.  POSTOPERATIVE DIAGNOSIS:  Plantar fasciitis, chronic, major, right.  ANESTHESIA:  IV sedation with local infiltration, hemostasis ankle.  ENDOSCOPIC PROCEDURE:  Right.  INDICATIONS FOR SURGERY:  Chronic discomfort, inability to wear shoe gear without difficulty.  This has been going on for a fairly long time and has failed to respond to conservative steroidal care and immobilization care.  FINDINGS:  The patient is brought to the OR, placed in the supine position on the OR table.  The patient was injected with 3 cc Xylocaine and a PT block. Nine cc of Xylocaine and Marcaine mixture and infiltration block.  The patients right foot was prepped and draped utilizing standard aseptic technique.  The right foot was exsanguinated utilizing an Esmarch and the ankle tourniquet was inflated to 250 mmHg.  The following procedure was performed:  Endoscopic release, medial fascial band, right.  Attention was directed to the medial aspect, right, where a small 1 cm incision was made just distal to the calcaneal shelf.  Incision was deepened to subcutaneous tissue and a fascial plane was prepared plantar to the plantar fascial band attaching to the plantar calcaneus.  This was brought forward to the lateral side.  A cannula was introduced from the medial portal and was taken to the lateral side where a small incision was made in the lateral side so as to allow for an X-point for the portal.  A 70-degree endoscopic hand was then introduced from the medial  side.  The plantar fascia was visualized in the dorsal direction.  Utilizing the triangular blade brought in from the lateral side, the medial one-third of the plantar fascia was severed.  It was then switched.  The camera was brought in from the lateral side and further transection of the tendon occurred, identified the extensor digitorum muscle belly, confirming good release of the plantar fascia.  At this point the cannula was removed and the camera was removed.  The wound was flushed with copious amounts of Garamycin solution, was sutured utilizing 4-0 nylon.  A dry, sterile, compressive dressing was applied to the right foot.  The right ankle tourniquet was deflated.  Capillary refill was noted to be immediate to all digits of the right foot.  The patient tolerated the surgery and anesthesia well.  Was returned to the OR in satisfactory condition. Dictated by:   Cordella Register, D.P.M. Attending Physician:  Cordella Register DD:  02/16/01 TD:  02/16/01 Job: [id number]

## 2010-09-03 ENCOUNTER — Encounter: Payer: Self-pay | Admitting: Adult Health

## 2010-09-03 ENCOUNTER — Ambulatory Visit: Payer: Medicare Other | Admitting: Pulmonary Disease

## 2010-09-03 ENCOUNTER — Ambulatory Visit (INDEPENDENT_AMBULATORY_CARE_PROVIDER_SITE_OTHER): Payer: Medicare Other | Admitting: Adult Health

## 2010-09-03 VITALS — BP 142/88 | HR 84 | Temp 99.1°F | Wt 182.0 lb

## 2010-09-03 DIAGNOSIS — J40 Bronchitis, not specified as acute or chronic: Secondary | ICD-10-CM

## 2010-09-03 MED ORDER — AZITHROMYCIN 250 MG PO TABS: ORAL_TABLET | ORAL | Status: AC

## 2010-09-03 MED ORDER — MOMETASONE FUROATE 50 MCG/ACT NA SUSP: 2.0000 | Freq: Every day | NASAL | Status: DC

## 2010-09-03 NOTE — Assessment & Plan Note (Signed)
URI flare in pt with tendency toward cyclical cough  tx for GERD/Rhinitis prevention  Plan;   Nasonex 2 puffs Twice daily  As needed  Congestion Chlorpheniramine 4mg  2 At bedtime  As needed  Drainage.  Increase Omeprazole 20mg  Twice daily  For 2 weeks then back to daily  can try hard candy in mouth all thru the day to bathe back of throat and reduce irritation. No peppermint or menthol  Limit voice use as much as possible. No singing or yelling.  Mucinex DM Twice daily  As needed  Cough/congestion  ZPack to have on hold if symptoms worsen with discolored mucus  Please contact office for sooner follow up if symptoms do not improve or worsen or seek emergency care

## 2010-09-03 NOTE — Progress Notes (Signed)
  Subjective:    Patient ID: Diana Wood, female    DOB: March 26, 1932, 76 y.o.   MRN: [id number]  HPI 77 yo female, never smoker,  with known hx of chronic cough complicated by GERD  followed by Dr. Shelle Iron  09/03/2010 Acute OV  Pt presents for an acute office visit. Complains of 3  weeks of cough, congestion, tightness, wheezing and deep barking cough. Feels like a rattle in chest at times. No hemoptysis or chest pain. No leg swelling. Leaving to go out of town tomorrow.  Last seen 02/2009.  Was doing well with coughing until 3 weeks ago.  Last CXR 2010 with no acute process.  Not taking any otc meds.     Review of Systems Constitutional:   No  weight loss, night sweats,  Fevers, chills, fatigue, or  lassitude.  HEENT:   No headaches,  Difficulty swallowing,  Tooth/dental problems, or  Sore throat,                No sneezing, itching, ear ache, nasal congestion, post nasal drip,   CV:  No chest pain,  Orthopnea, PND, swelling in lower extremities, anasarca, dizziness, palpitations, syncope.   GI  No heartburn, indigestion, abdominal pain, nausea, vomiting, diarrhea, change in bowel habits, loss of appetite, bloody stools.   Resp: No shortness of breath with exertion or at rest.     No coughing up of blood.  No change in color of mucus.  No wheezing.  No chest wall deformity  Skin: no rash or lesions.  GU: no dysuria, change in color of urine, no urgency or frequency.  No flank pain, no hematuria   MS:  No joint pain or swelling.  No decreased range of motion.  No back pain.  Psych:  No change in mood or affect. No depression or anxiety.  No memory loss.         Objective:   Physical Exam GEN: A/Ox3; pleasant , NAD, overweight   HEENT:  [place]/AT,  EACs-clear, TMs-wnl, NOSE-clear, THROAT-clear, no lesions, no postnasal drip or exudate noted.   NECK:  Supple w/ fair ROM; no JVD; normal carotid impulses w/o bruits; no thyromegaly or nodules palpated; no lymphadenopathy.  RESP   Coarse BS  w/o, wheezes/ rales/ or rhonchi.no accessory muscle use, no dullness to percussion  CARD:  RRR, no m/r/g  , no peripheral edema, pulses intact, no cyanosis or clubbing.  GI:   Soft & nt; nml bowel sounds; no organomegaly or masses detected.  Musco: Warm bil, no deformities or joint swelling noted.   Neuro: alert, no focal deficits noted.    Skin: Warm, no lesions or rashes         Assessment & Plan:

## 2010-09-03 NOTE — Patient Instructions (Signed)
Nasonex 2 puffs Twice daily  As needed  Congestion Chlorpheniramine 4mg  2 At bedtime  As needed  Drainage.  Increase Omeprazole 20mg  Twice daily  For 2 weeks then back to daily  can try hard candy in mouth all thru the day to bathe back of throat and reduce irritation. No peppermint or menthol  Limit voice use as much as possible. No singing or yelling.  Mucinex DM Twice daily  As needed  Cough/congestion  ZPack to have on hold if symptoms worsen with discolored mucus  Please contact office for sooner follow up if symptoms do not improve or worsen or seek emergency care

## 2010-10-11 LAB — POCT I-STAT, CHEM 8
BUN: 20 mg/dL (ref 6–23)
Calcium, Ion: 1.2 mmol/L (ref 1.12–1.32)
Creatinine, Ser: 0.9 mg/dL (ref 0.4–1.2)
TCO2: 26 mmol/L (ref 0–100)

## 2010-10-11 LAB — GLUCOSE, CAPILLARY: Glucose-Capillary: 120 mg/dL — ABNORMAL HIGH (ref 70–99)

## 2010-10-16 LAB — PLATELET COUNT: Platelets: 188

## 2010-10-16 LAB — I-STAT EC8
Acid-base deficit: 7 — ABNORMAL HIGH
Acid-base deficit: 7 — ABNORMAL HIGH
BUN: 16
Chloride: 108
Chloride: 108
Glucose, Bld: 133 — ABNORMAL HIGH
HCT: 35 — ABNORMAL LOW
Hemoglobin: 11.9 — ABNORMAL LOW
Operator id: 273681
Potassium: 5.9 — ABNORMAL HIGH
Potassium: 6 — ABNORMAL HIGH
TCO2: 19
pCO2 arterial: 34.8 — ABNORMAL LOW
pH, Arterial: 7.331 — ABNORMAL LOW

## 2010-10-16 LAB — CARDIAC PANEL(CRET KIN+CKTOT+MB+TROPI)
CK, MB: 6.3 — ABNORMAL HIGH
Total CK: 99
Troponin I: 0.67

## 2010-10-16 LAB — POCT I-STAT 4, (NA,K, GLUC, HGB,HCT)
Glucose, Bld: 102 — ABNORMAL HIGH
Glucose, Bld: 110 — ABNORMAL HIGH
Glucose, Bld: 117 — ABNORMAL HIGH
Glucose, Bld: 121 — ABNORMAL HIGH
Glucose, Bld: 130 — ABNORMAL HIGH
HCT: 15 — ABNORMAL LOW
HCT: 21 — ABNORMAL LOW
HCT: 26 — ABNORMAL LOW
HCT: 29 — ABNORMAL LOW
HCT: 34 — ABNORMAL LOW
Hemoglobin: 11.6 — ABNORMAL LOW
Hemoglobin: 5.1 — CL
Hemoglobin: 7.1 — CL
Hemoglobin: 9.9 — ABNORMAL LOW
Operator id: 284731
Operator id: 3291
Operator id: 3291
Operator id: 3291
Operator id: 3291
Potassium: 3.7
Potassium: 4
Potassium: 4.2
Potassium: 4.5
Potassium: 4.9
Sodium: 131 — ABNORMAL LOW
Sodium: 134 — ABNORMAL LOW
Sodium: 134 — ABNORMAL LOW
Sodium: 137
Sodium: 138

## 2010-10-16 LAB — CROSSMATCH
ABO/RH(D): A POS
Antibody Screen: NEGATIVE

## 2010-10-16 LAB — URINALYSIS, MICROSCOPIC ONLY
Bilirubin Urine: NEGATIVE
Glucose, UA: NEGATIVE
Hgb urine dipstick: NEGATIVE
Ketones, ur: NEGATIVE
Nitrite: NEGATIVE
Protein, ur: NEGATIVE
Specific Gravity, Urine: 1.008
Urobilinogen, UA: 0.2
pH: 6

## 2010-10-16 LAB — I-STAT 8, (EC8 V) (CONVERTED LAB)
Acid-Base Excess: 1
BUN: 17
Chloride: 102
HCT: 35 — ABNORMAL LOW
Operator id: 262201
Potassium: 4
pCO2, Ven: 41.8 — ABNORMAL LOW

## 2010-10-16 LAB — BASIC METABOLIC PANEL
BUN: 13
BUN: 14
BUN: 19
CO2: 21
CO2: 28
Calcium: 8.3 — ABNORMAL LOW
Calcium: 8.3 — ABNORMAL LOW
Calcium: 8.5
Chloride: 100
Chloride: 106
Creatinine, Ser: 1.21 — ABNORMAL HIGH
Creatinine, Ser: 1.4 — ABNORMAL HIGH
GFR calc Af Amer: 45 — ABNORMAL LOW
GFR calc Af Amer: 52 — ABNORMAL LOW
GFR calc Af Amer: 53 — ABNORMAL LOW
GFR calc non Af Amer: 37 — ABNORMAL LOW
GFR calc non Af Amer: 43 — ABNORMAL LOW
GFR calc non Af Amer: 43 — ABNORMAL LOW
Glucose, Bld: 133 — ABNORMAL HIGH
Potassium: 4.6
Sodium: 136
Sodium: 137
Sodium: 137

## 2010-10-16 LAB — CBC
HCT: 29.1 — ABNORMAL LOW
HCT: 29.8 — ABNORMAL LOW
HCT: 30.9 — ABNORMAL LOW
HCT: 31.2 — ABNORMAL LOW
HCT: 32 — ABNORMAL LOW
HCT: 32.5 — ABNORMAL LOW
HCT: 33.3 — ABNORMAL LOW
HCT: 34.7 — ABNORMAL LOW
Hemoglobin: 10.4 — ABNORMAL LOW
Hemoglobin: 10.6 — ABNORMAL LOW
Hemoglobin: 10.6 — ABNORMAL LOW
Hemoglobin: 10.8 — ABNORMAL LOW
Hemoglobin: 10.9 — ABNORMAL LOW
Hemoglobin: 11.8 — ABNORMAL LOW
Hemoglobin: 12
Hemoglobin: 9.9 — ABNORMAL LOW
MCHC: 33.5
MCHC: 33.5
MCHC: 33.8
MCHC: 33.8
MCHC: 33.9
MCHC: 34.2
MCHC: 34.2
MCHC: 34.3
MCHC: 34.3
MCHC: 34.5
MCV: 85.4
MCV: 85.6
MCV: 86.1
MCV: 86.4
MCV: 86.4
MCV: 87
MCV: 87
Platelets: 200
Platelets: 215
Platelets: 219
Platelets: 310
Platelets: 311
Platelets: 318
Platelets: 325
RBC: 3.55 — ABNORMAL LOW
RBC: 3.56 — ABNORMAL LOW
RBC: 3.59 — ABNORMAL LOW
RBC: 3.72 — ABNORMAL LOW
RBC: 3.89
RBC: 4.07
RDW: 13.8
RDW: 14
RDW: 14
RDW: 14
RDW: 14.1
RDW: 14.2 — ABNORMAL HIGH
RDW: 14.3 — ABNORMAL HIGH
WBC: 13.3 — ABNORMAL HIGH
WBC: 17.8 — ABNORMAL HIGH
WBC: 8.1
WBC: 8.2
WBC: 9.7

## 2010-10-16 LAB — BASIC METABOLIC PANEL WITH GFR
BUN: 15
BUN: 21
CO2: 25
CO2: 25
Calcium: 8.9
Calcium: 9.3
Chloride: 107
Chloride: 109
Creatinine, Ser: 1.16
Creatinine, Ser: 1.18
GFR calc non Af Amer: 45 — ABNORMAL LOW
GFR calc non Af Amer: 46 — ABNORMAL LOW
Glucose, Bld: 116 — ABNORMAL HIGH
Glucose, Bld: 146 — ABNORMAL HIGH
Potassium: 3.5
Potassium: 4.5
Sodium: 138
Sodium: 139

## 2010-10-16 LAB — POCT I-STAT 3, ART BLOOD GAS (G3+)
Acid-base deficit: 4 — ABNORMAL HIGH
Acid-base deficit: 7 — ABNORMAL HIGH
Bicarbonate: 18.1 — ABNORMAL LOW
Bicarbonate: 21.9
Bicarbonate: 24.8 — ABNORMAL HIGH
O2 Saturation: 100
O2 Saturation: 94
O2 Saturation: 94
Operator id: 209401
Operator id: 284731
Operator id: 3291
Operator id: 3291
TCO2: 19
TCO2: 23
pCO2 arterial: 34.6 — ABNORMAL LOW
pCO2 arterial: 39.4
pCO2 arterial: 42.1
pH, Arterial: 7.319 — ABNORMAL LOW
pH, Arterial: 7.327 — ABNORMAL LOW
pO2, Arterial: 246 — ABNORMAL HIGH
pO2, Arterial: 75 — ABNORMAL LOW
pO2, Arterial: 78 — ABNORMAL LOW

## 2010-10-16 LAB — COMPREHENSIVE METABOLIC PANEL
ALT: 36 — ABNORMAL HIGH
AST: 39 — ABNORMAL HIGH
Calcium: 9.1
Creatinine, Ser: 1.25 — ABNORMAL HIGH
GFR calc Af Amer: 51 — ABNORMAL LOW
Sodium: 137
Total Protein: 6.4

## 2010-10-16 LAB — LIPID PANEL
HDL: 38 — ABNORMAL LOW
Total CHOL/HDL Ratio: 3.8
Triglycerides: 118
VLDL: 24

## 2010-10-16 LAB — CREATININE, SERUM
Creatinine, Ser: 1.28 — ABNORMAL HIGH
GFR calc Af Amer: 44 — ABNORMAL LOW
GFR calc non Af Amer: 41 — ABNORMAL LOW

## 2010-10-16 LAB — DIFFERENTIAL
Eosinophils Absolute: 0.2
Eosinophils Relative: 3
Lymphocytes Relative: 40
Lymphs Abs: 3.3
Monocytes Relative: 14 — ABNORMAL HIGH

## 2010-10-16 LAB — HEMOGLOBIN A1C
Hgb A1c MFr Bld: 7.1 — ABNORMAL HIGH
Mean Plasma Glucose: 175

## 2010-10-16 LAB — APTT: aPTT: 25

## 2010-10-16 LAB — POCT I-STAT 3, VENOUS BLOOD GAS (G3P V)
Operator id: 3291
pCO2, Ven: 36.8 — ABNORMAL LOW
pO2, Ven: 67 — ABNORMAL HIGH

## 2010-10-16 LAB — POCT I-STAT GLUCOSE
Glucose, Bld: 108 — ABNORMAL HIGH
Glucose, Bld: 116 — ABNORMAL HIGH
Operator id: 119881

## 2010-10-16 LAB — PROTIME-INR
INR: 1.2
Prothrombin Time: 12.5

## 2010-10-16 LAB — POCT CARDIAC MARKERS
CKMB, poc: 1.9
Myoglobin, poc: 96.9
Operator id: 282201
Troponin i, poc: 0.11 — ABNORMAL HIGH

## 2010-10-16 LAB — T3 UPTAKE: T3 Uptake Ratio: 39.8 — ABNORMAL HIGH

## 2010-10-16 LAB — BLOOD GAS, ARTERIAL
Acid-Base Excess: 0.6
Bicarbonate: 24.7 — ABNORMAL HIGH
O2 Saturation: 95.2
Patient temperature: 98.1
pO2, Arterial: 72.6 — ABNORMAL LOW

## 2010-10-16 LAB — TSH
TSH: 0.422
TSH: 2.616

## 2010-10-16 LAB — D-DIMER, QUANTITATIVE: D-Dimer, Quant: 0.5 — ABNORMAL HIGH

## 2010-10-16 LAB — HEPARIN LEVEL (UNFRACTIONATED)
Heparin Unfractionated: 0.45
Heparin Unfractionated: 0.55

## 2010-10-16 LAB — HEMOGLOBIN AND HEMATOCRIT, BLOOD
HCT: 23.4 — ABNORMAL LOW
Hemoglobin: 8 — ABNORMAL LOW

## 2010-10-16 LAB — TROPONIN I: Troponin I: 0.23 — ABNORMAL HIGH

## 2010-10-16 LAB — CK TOTAL AND CKMB (NOT AT ARMC): Total CK: 110

## 2010-10-16 LAB — T4, FREE
Free T4: 1.2
Free T4: 1.78

## 2011-01-17 ENCOUNTER — Ambulatory Visit (INDEPENDENT_AMBULATORY_CARE_PROVIDER_SITE_OTHER): Payer: Medicare Other | Admitting: Cardiovascular Disease

## 2011-01-17 ENCOUNTER — Encounter: Payer: Self-pay | Admitting: Cardiovascular Disease

## 2011-01-17 VITALS — BP 170/80 | HR 69 | Wt 182.0 lb

## 2011-01-17 DIAGNOSIS — I10 Essential (primary) hypertension: Secondary | ICD-10-CM

## 2011-01-17 DIAGNOSIS — E785 Hyperlipidemia, unspecified: Secondary | ICD-10-CM

## 2011-01-17 DIAGNOSIS — I2510 Atherosclerotic heart disease of native coronary artery without angina pectoris: Secondary | ICD-10-CM | POA: Diagnosis not present

## 2011-01-17 NOTE — Patient Instructions (Signed)
Your physician wants you to follow-up in: 1 year. You will receive a reminder letter in the mail two months in advance. If you don't receive a letter, please call our office to schedule the follow-up appointment.  

## 2011-01-17 NOTE — Assessment & Plan Note (Signed)
Elevated today.  Encouraged her to monitor at home Can increase losartin in future if needed.  F/U Timothy Lasso

## 2011-01-17 NOTE — Assessment & Plan Note (Signed)
Stable with no angina and good activity level.  Continue medical Rx  

## 2011-01-17 NOTE — Assessment & Plan Note (Signed)
Cholesterol is at goal.  Continue current dose of statin and diet Rx.  No myalgias or side effects.  F/U  LFT's in 6 months. Lab Results  Component Value Date   [facility]  Value: 83        Total Cholesterol/HDL:CHD Risk Coronary Heart Disease Risk Table                     Men   Women  1/2 Average Risk   3.4   3.3 11/14/2006

## 2011-01-17 NOTE — Assessment & Plan Note (Signed)
Discussed low carb diet.  Target hemoglobin A1c is 6.5 or less.  Continue current medications.  

## 2011-01-17 NOTE — Progress Notes (Signed)
Diana Wood is seen today for F/U of her hypertension, hyperlipidemia and CAD. 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 She has normal LV fucnton. she had a normal Myoview on May 18, 2007. There was no ischemia with an ejection fraction of 75%. She continues to have occasional atypical twinges of sharp pain in the left side of her chest. She had these previously and had a nonischemic Myoview. I think the neuropathic or from her sternotomy and clearly does not sound like her angina. She continues to have dry eyes and may need a corneal transplant on the left eye. Her eye doctor is in [place]. She had right TKR with Dr Rennis Chris since I last saw her with no complications   Continues to dress with lots of jewelry.  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  All other systems reviewed and negative  General: Affect appropriate Healthy:  appears stated age HEENT: normal Neck supple with no adenopathy JVP normal no bruits no thyromegaly Lungs clear with no wheezing and good diaphragmatic motion Heart:  S1/S2 no murmur,rub, gallop or click PMI normal Abdomen: benighn, BS positve, no tenderness, no AAA no bruit.  No HSM or HJR Distal pulses intact with no bruits No edema Neuro non-focal Skin warm and dry No muscular weakness   Current Outpatient Prescriptions  Medication Sig Dispense Refill  . aspirin 325 MG tablet Take 325 mg by mouth every other day.      . fenofibrate (TRICOR) 145 MG tablet Take 145 mg by mouth daily.        [name] levothyroxine (SYNTHROID, LEVOTHROID) 125 MCG tablet Take 125 mcg by mouth daily. Except on sunday's take 1/2 tablet       . losartan (COZAAR) 25 MG tablet Take 25 mg by mouth daily.        . metFORMIN (GLUMETZA) 500 MG (MOD) 24 hr tablet Take 500 mg  by mouth daily.        . metoprolol (TOPROL-XL) 50 MG 24 hr tablet Take 50 mg by mouth daily.        . mometasone (NASONEX) 50 MCG/ACT nasal spray Place 2 sprays into the nose daily.  17 g  5  . omeprazole (PRILOSEC) 20 MG capsule Take 20 mg by mouth daily.          Allergies  Review of patient's allergies indicates no known allergies.  Electrocardiogram:  NSR rate 69  Normal ECG  Assessment and Plan

## 2011-01-30 DIAGNOSIS — E039 Hypothyroidism, unspecified: Secondary | ICD-10-CM | POA: Diagnosis not present

## 2011-01-30 DIAGNOSIS — I2510 Atherosclerotic heart disease of native coronary artery without angina pectoris: Secondary | ICD-10-CM | POA: Diagnosis not present

## 2011-01-30 DIAGNOSIS — I10 Essential (primary) hypertension: Secondary | ICD-10-CM | POA: Diagnosis not present

## 2011-01-30 DIAGNOSIS — E119 Type 2 diabetes mellitus without complications: Secondary | ICD-10-CM | POA: Diagnosis not present

## 2011-02-04 DIAGNOSIS — M6530 Trigger finger, unspecified finger: Secondary | ICD-10-CM | POA: Diagnosis not present

## 2011-02-11 ENCOUNTER — Other Ambulatory Visit: Payer: Self-pay | Admitting: Gynecology

## 2011-02-11 DIAGNOSIS — E7800 Pure hypercholesterolemia, unspecified: Secondary | ICD-10-CM | POA: Diagnosis not present

## 2011-02-11 DIAGNOSIS — I10 Essential (primary) hypertension: Secondary | ICD-10-CM | POA: Diagnosis not present

## 2011-02-11 DIAGNOSIS — E119 Type 2 diabetes mellitus without complications: Secondary | ICD-10-CM | POA: Diagnosis not present

## 2011-02-11 DIAGNOSIS — Z9189 Other specified personal risk factors, not elsewhere classified: Secondary | ICD-10-CM | POA: Diagnosis not present

## 2011-03-11 DIAGNOSIS — H11829 Conjunctivochalasis, unspecified eye: Secondary | ICD-10-CM | POA: Diagnosis not present

## 2011-03-11 DIAGNOSIS — H1810 Bullous keratopathy, unspecified eye: Secondary | ICD-10-CM | POA: Diagnosis not present

## 2011-03-11 DIAGNOSIS — E119 Type 2 diabetes mellitus without complications: Secondary | ICD-10-CM | POA: Diagnosis not present

## 2011-03-11 DIAGNOSIS — H04129 Dry eye syndrome of unspecified lacrimal gland: Secondary | ICD-10-CM | POA: Diagnosis not present

## 2011-03-11 DIAGNOSIS — H1820 Unspecified corneal edema: Secondary | ICD-10-CM | POA: Diagnosis not present

## 2011-03-11 DIAGNOSIS — Z947 Corneal transplant status: Secondary | ICD-10-CM | POA: Diagnosis not present

## 2011-03-11 DIAGNOSIS — D3130 Benign neoplasm of unspecified choroid: Secondary | ICD-10-CM | POA: Diagnosis not present

## 2011-05-30 DIAGNOSIS — I10 Essential (primary) hypertension: Secondary | ICD-10-CM | POA: Diagnosis not present

## 2011-05-30 DIAGNOSIS — E119 Type 2 diabetes mellitus without complications: Secondary | ICD-10-CM | POA: Diagnosis not present

## 2011-05-30 DIAGNOSIS — E039 Hypothyroidism, unspecified: Secondary | ICD-10-CM | POA: Diagnosis not present

## 2011-05-30 DIAGNOSIS — E785 Hyperlipidemia, unspecified: Secondary | ICD-10-CM | POA: Diagnosis not present

## 2011-06-19 DIAGNOSIS — S199XXA Unspecified injury of neck, initial encounter: Secondary | ICD-10-CM | POA: Diagnosis not present

## 2011-06-19 DIAGNOSIS — S0003XA Contusion of scalp, initial encounter: Secondary | ICD-10-CM | POA: Diagnosis not present

## 2011-06-19 DIAGNOSIS — I2510 Atherosclerotic heart disease of native coronary artery without angina pectoris: Secondary | ICD-10-CM | POA: Diagnosis not present

## 2011-06-19 DIAGNOSIS — S0990XA Unspecified injury of head, initial encounter: Secondary | ICD-10-CM | POA: Diagnosis not present

## 2011-07-23 DIAGNOSIS — H409 Unspecified glaucoma: Secondary | ICD-10-CM | POA: Diagnosis not present

## 2011-07-23 DIAGNOSIS — H4011X0 Primary open-angle glaucoma, stage unspecified: Secondary | ICD-10-CM | POA: Diagnosis not present

## 2011-08-05 DIAGNOSIS — Z947 Corneal transplant status: Secondary | ICD-10-CM | POA: Diagnosis not present

## 2011-08-06 DIAGNOSIS — Z947 Corneal transplant status: Secondary | ICD-10-CM | POA: Insufficient documentation

## 2011-08-06 DIAGNOSIS — H18519 Endothelial corneal dystrophy, unspecified eye: Secondary | ICD-10-CM | POA: Insufficient documentation

## 2011-08-15 DIAGNOSIS — S8000XA Contusion of unspecified knee, initial encounter: Secondary | ICD-10-CM | POA: Diagnosis not present

## 2011-08-27 DIAGNOSIS — M1710 Unilateral primary osteoarthritis, unspecified knee: Secondary | ICD-10-CM | POA: Diagnosis not present

## 2011-09-03 DIAGNOSIS — E785 Hyperlipidemia, unspecified: Secondary | ICD-10-CM | POA: Diagnosis not present

## 2011-09-03 DIAGNOSIS — I2510 Atherosclerotic heart disease of native coronary artery without angina pectoris: Secondary | ICD-10-CM | POA: Diagnosis not present

## 2011-09-03 DIAGNOSIS — K625 Hemorrhage of anus and rectum: Secondary | ICD-10-CM | POA: Diagnosis not present

## 2011-09-03 DIAGNOSIS — E119 Type 2 diabetes mellitus without complications: Secondary | ICD-10-CM | POA: Diagnosis not present

## 2011-09-16 DIAGNOSIS — M1710 Unilateral primary osteoarthritis, unspecified knee: Secondary | ICD-10-CM | POA: Diagnosis not present

## 2011-09-20 DIAGNOSIS — M1710 Unilateral primary osteoarthritis, unspecified knee: Secondary | ICD-10-CM | POA: Diagnosis not present

## 2011-09-24 DIAGNOSIS — M1710 Unilateral primary osteoarthritis, unspecified knee: Secondary | ICD-10-CM | POA: Diagnosis not present

## 2011-09-26 DIAGNOSIS — Z1212 Encounter for screening for malignant neoplasm of rectum: Secondary | ICD-10-CM | POA: Diagnosis not present

## 2011-09-26 DIAGNOSIS — M1710 Unilateral primary osteoarthritis, unspecified knee: Secondary | ICD-10-CM | POA: Diagnosis not present

## 2011-09-30 DIAGNOSIS — M1710 Unilateral primary osteoarthritis, unspecified knee: Secondary | ICD-10-CM | POA: Diagnosis not present

## 2011-10-02 DIAGNOSIS — M1710 Unilateral primary osteoarthritis, unspecified knee: Secondary | ICD-10-CM | POA: Diagnosis not present

## 2011-10-09 DIAGNOSIS — M1710 Unilateral primary osteoarthritis, unspecified knee: Secondary | ICD-10-CM | POA: Diagnosis not present

## 2011-10-10 DIAGNOSIS — M1710 Unilateral primary osteoarthritis, unspecified knee: Secondary | ICD-10-CM | POA: Diagnosis not present

## 2011-11-05 DIAGNOSIS — Z23 Encounter for immunization: Secondary | ICD-10-CM | POA: Diagnosis not present

## 2011-12-09 DIAGNOSIS — H1130 Conjunctival hemorrhage, unspecified eye: Secondary | ICD-10-CM | POA: Diagnosis not present

## 2011-12-09 DIAGNOSIS — E039 Hypothyroidism, unspecified: Secondary | ICD-10-CM | POA: Diagnosis not present

## 2011-12-09 DIAGNOSIS — E119 Type 2 diabetes mellitus without complications: Secondary | ICD-10-CM | POA: Diagnosis not present

## 2011-12-09 DIAGNOSIS — I2510 Atherosclerotic heart disease of native coronary artery without angina pectoris: Secondary | ICD-10-CM | POA: Diagnosis not present

## 2011-12-09 DIAGNOSIS — E785 Hyperlipidemia, unspecified: Secondary | ICD-10-CM | POA: Diagnosis not present

## 2011-12-24 DIAGNOSIS — K769 Liver disease, unspecified: Secondary | ICD-10-CM | POA: Diagnosis not present

## 2011-12-24 DIAGNOSIS — R945 Abnormal results of liver function studies: Secondary | ICD-10-CM | POA: Diagnosis not present

## 2012-01-13 ENCOUNTER — Other Ambulatory Visit: Payer: Self-pay | Admitting: Orthopedic Surgery

## 2012-01-13 DIAGNOSIS — Z961 Presence of intraocular lens: Secondary | ICD-10-CM | POA: Diagnosis not present

## 2012-01-13 DIAGNOSIS — H04129 Dry eye syndrome of unspecified lacrimal gland: Secondary | ICD-10-CM | POA: Diagnosis not present

## 2012-01-13 DIAGNOSIS — Z947 Corneal transplant status: Secondary | ICD-10-CM | POA: Diagnosis not present

## 2012-01-13 MED ORDER — DEXAMETHASONE SODIUM PHOSPHATE 10 MG/ML IJ SOLN: 10.0000 mg | Freq: Once | INTRAMUSCULAR | Status: DC

## 2012-01-13 MED ORDER — BUPIVACAINE LIPOSOME 1.3 % IJ SUSP: 20.0000 mL | Freq: Once | INTRAMUSCULAR | Status: DC

## 2012-01-13 NOTE — Progress Notes (Signed)
Preoperative surgical orders have been place into the Epic hospital system for Diana Wood on 01/13/2012, 5:00 PM  by Patrica Duel for surgery on 03/16/2012.  Preop Total Knee orders including Experal, IV Tylenol, and IV Decadron as long as there are no contraindications to the above medications. Avel Peace, PA-C

## 2012-01-14 ENCOUNTER — Ambulatory Visit: Payer: Medicare Other | Admitting: Cardiovascular Disease

## 2012-01-15 ENCOUNTER — Encounter: Payer: Self-pay | Admitting: Cardiovascular Disease

## 2012-01-15 ENCOUNTER — Ambulatory Visit (INDEPENDENT_AMBULATORY_CARE_PROVIDER_SITE_OTHER): Payer: Medicare Other | Admitting: Cardiovascular Disease

## 2012-01-15 VITALS — BP 188/89 | HR 85 | Ht 61.0 in | Wt 178.0 lb

## 2012-01-15 DIAGNOSIS — E119 Type 2 diabetes mellitus without complications: Secondary | ICD-10-CM | POA: Diagnosis not present

## 2012-01-15 DIAGNOSIS — I2510 Atherosclerotic heart disease of native coronary artery without angina pectoris: Secondary | ICD-10-CM

## 2012-01-15 DIAGNOSIS — I10 Essential (primary) hypertension: Secondary | ICD-10-CM

## 2012-01-15 DIAGNOSIS — E785 Hyperlipidemia, unspecified: Secondary | ICD-10-CM

## 2012-01-15 NOTE — Assessment & Plan Note (Signed)
Stable with no angina and good activity level.  Continue medical Rx CABG in 2008 stable

## 2012-01-15 NOTE — Progress Notes (Signed)
Patient ID: Diana Wood, female   DOB: 1932/02/10, 79 y.o.   MRN: [id number] Diana Wood is seen today for F/U of her hypertension, hyperlipidemia and CAD. 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 She has normal LV fucnton. she had a normal Myoview on May 18, 2007. There was no ischemia with an ejection fraction of 75%. She continues to have occasional atypical twinges of sharp pain in the left side of her chest. She had these previously and had a nonischemic Myoview. I think the neuropathic or from her sternotomy and clearly does not sound like her angina. She continues to have dry eyes and may need a corneal transplant on the left eye. Her eye doctor is in [place]. She had right TKR with Dr Rennis Chris since I last saw her with no complications  Continues to dress with lots of jewelry.   LFT;s up being followed by Dr Timothy Lasso   ROS: Denies fever, malais, weight loss, blurry vision, decreased visual acuity, cough, sputum, SOB, hemoptysis, pleuritic pain, palpitaitons, heartburn, abdominal pain, melena, lower extremity edema, claudication, or rash.  All other systems reviewed and negative  General: Affect appropriate Healthy:  appears stated age HEENT: normal Neck supple with no adenopathy JVP normal no bruits no thyromegaly Lungs clear with no wheezing and good diaphragmatic motion Heart:  S1/S2 no murmur, no rub, gallop or click PMI normal Abdomen: benighn, BS positve, no tenderness, no AAA no bruit.  No HSM or HJR Distal pulses intact with no bruits No edema Neuro non-focal Skin warm and dry No muscular weakness   Current Outpatient Prescriptions  Medication Sig Dispense Refill  . fenofibrate (TRICOR) 145 MG tablet Take 145 mg by mouth daily.        [name] levothyroxine (SYNTHROID, LEVOTHROID) 125 MCG tablet Take 125 mcg by mouth daily. Except on sunday's take 1/2 tablet       . losartan (COZAAR) 25 MG tablet Take 25 mg by mouth 2 (two) times daily.       . metFORMIN (GLUMETZA) 500 MG (MOD) 24 hr tablet Take  500 mg by mouth daily.        . metoprolol (TOPROL-XL) 50 MG 24 hr tablet Take 50 mg by mouth daily.        [name] omeprazole (PRILOSEC) 20 MG capsule Take 20 mg by mouth daily.        [name] aspirin 325 MG tablet Take 325 mg by mouth every other day.        Allergies  Review of patient's allergies indicates no known allergies.  Electrocardiogram:  01/20/11 SR rate 69 normal  Today SR rate 68 PR 214 otherwise normal  Assessment and Plan

## 2012-01-15 NOTE — Patient Instructions (Signed)
Your physician wants you to follow-up in: 6 months with Dr. Nishan. You will receive a reminder letter in the mail two months in advance. If you don't receive a letter, please call our office to schedule the follow-up appointment.  

## 2012-01-15 NOTE — Assessment & Plan Note (Signed)
Discussed low carb diet.  Target hemoglobin A1c is 6.5 or less.  Continue current medications.  

## 2012-01-15 NOTE — Assessment & Plan Note (Signed)
Well controlled.  Continue current medications and low sodium Dash type diet.    

## 2012-01-15 NOTE — Assessment & Plan Note (Signed)
Cholesterol is at goal.  Continue current dose of statin and diet Rx.  No myalgias or side effects.  F/U  LFT's in 6 months. Lab Results  Component Value Date   [facility]  Value: 83        Total Cholesterol/HDL:CHD Risk Coronary Heart Disease Risk Table                     Men   Women  1/2 Average Risk   3.4   3.3 11/14/2006   F/U LFTs with Dr Timothy Lasso

## 2012-02-11 ENCOUNTER — Other Ambulatory Visit: Payer: Self-pay | Admitting: Orthopedic Surgery

## 2012-02-11 MED ORDER — BUPIVACAINE LIPOSOME 1.3 % IJ SUSP: 20.0000 mL | Freq: Once | INTRAMUSCULAR | Status: DC

## 2012-02-11 MED ORDER — DEXAMETHASONE SODIUM PHOSPHATE 10 MG/ML IJ SOLN: 10.0000 mg | Freq: Once | INTRAMUSCULAR | Status: DC

## 2012-02-11 NOTE — Progress Notes (Signed)
Preoperative surgical orders have been place into the Epic hospital system for Diana Wood on 02/11/2012, 7:51 AM  by Patrica Duel for surgery on 03/16/2012.  Preop Total Knee orders including Experal, IV Tylenol, and IV Decadron as long as there are no contraindications to the above medications. Avel Peace, PA-C

## 2012-02-13 ENCOUNTER — Other Ambulatory Visit: Payer: Self-pay | Admitting: Gynecology

## 2012-02-13 DIAGNOSIS — E119 Type 2 diabetes mellitus without complications: Secondary | ICD-10-CM | POA: Diagnosis not present

## 2012-02-13 DIAGNOSIS — N8184 Pelvic muscle wasting: Secondary | ICD-10-CM | POA: Diagnosis not present

## 2012-02-13 DIAGNOSIS — Z9189 Other specified personal risk factors, not elsewhere classified: Secondary | ICD-10-CM | POA: Diagnosis not present

## 2012-02-18 DIAGNOSIS — R945 Abnormal results of liver function studies: Secondary | ICD-10-CM | POA: Diagnosis not present

## 2012-02-18 DIAGNOSIS — R160 Hepatomegaly, not elsewhere classified: Secondary | ICD-10-CM | POA: Diagnosis not present

## 2012-02-24 ENCOUNTER — Ambulatory Visit: Admit: 2012-02-24 | Payer: Self-pay | Admitting: Orthopedic Surgery

## 2012-02-24 SURGERY — ARTHROPLASTY, KNEE, TOTAL
Anesthesia: Choice | Site: Knee | Laterality: Left

## 2012-03-05 ENCOUNTER — Encounter (HOSPITAL_COMMUNITY): Payer: Self-pay | Admitting: Pharmacy Technician

## 2012-03-10 DIAGNOSIS — R748 Abnormal levels of other serum enzymes: Secondary | ICD-10-CM | POA: Diagnosis not present

## 2012-03-10 DIAGNOSIS — K760 Fatty (change of) liver, not elsewhere classified: Secondary | ICD-10-CM | POA: Insufficient documentation

## 2012-03-10 DIAGNOSIS — E119 Type 2 diabetes mellitus without complications: Secondary | ICD-10-CM | POA: Diagnosis not present

## 2012-03-10 NOTE — Patient Instructions (Addendum)
20 ARTEMISA SLADEK  03/10/2012   Your procedure is scheduled on:  3-10 -2014  Report to [facility] at    0515    AM  Call this number if you have problems the morning of surgery: [phone]  Or Presurgical Testing [phone]([place])      Do not eat food:After Midnight.    Take these medicines the morning of surgery with A SIP OF WATER: Fenofibrate. Levothyroxine. Metoprolol. Omeprazole. Do not take Metformin or Losartan Am of- may take all other days before.   Do not wear jewelry, make-up or nail polish.  Do not wear lotions, powders, or perfumes. You may wear deodorant.  Do not shave 12 hours prior to first CHG shower(legs and under arms).(face and neck okay.)  Do not bring valuables to the hospital.  Contacts, dentures or bridgework,body piercing,  may not be worn into surgery.  Leave suitcase in the car. After surgery it may be brought to your room.  For patients admitted to the hospital, checkout time is 11:00 AM the day of discharge.   Patients discharged the day of surgery will not be allowed to drive home. Must have responsible person with you x 24 hours once discharged.  Name and phone number of your driver: Cletus Norman-spouse 336 [phone] h  Special Instructions: CHG(Chlorhedine 4%-"Hibiclens","Betasept","Aplicare") Shower Use Special Wash: see special instructions.(avoid face and genitals)   Please read over the following fact sheets that you were given: MRSA Information, Blood Transfusion fact sheet, Incentive Spirometry Instruction.    Failure to follow these instructions may result in Cancellation of your surgery.   Patient signature_______________________________________________________

## 2012-03-11 ENCOUNTER — Encounter (HOSPITAL_COMMUNITY): Payer: Self-pay

## 2012-03-11 ENCOUNTER — Encounter (HOSPITAL_COMMUNITY)
Admission: RE | Admit: 2012-03-11 | Discharge: 2012-03-11 | Disposition: A | Discharge status: Home / Self Care | Payer: Medicare Other | Source: Ambulatory Visit | Attending: Orthopedic Surgery | Admitting: Orthopedic Surgery

## 2012-03-11 ENCOUNTER — Ambulatory Visit (HOSPITAL_COMMUNITY)
Admission: RE | Admit: 2012-03-11 | Discharge: 2012-03-11 | Disposition: A | Discharge status: Home / Self Care | Payer: Medicare Other | Source: Ambulatory Visit | Attending: Orthopedic Surgery | Admitting: Orthopedic Surgery

## 2012-03-11 DIAGNOSIS — Z01818 Encounter for other preprocedural examination: Secondary | ICD-10-CM | POA: Diagnosis not present

## 2012-03-11 DIAGNOSIS — Z01812 Encounter for preprocedural laboratory examination: Secondary | ICD-10-CM | POA: Insufficient documentation

## 2012-03-11 DIAGNOSIS — J984 Other disorders of lung: Secondary | ICD-10-CM | POA: Diagnosis not present

## 2012-03-11 DIAGNOSIS — Z951 Presence of aortocoronary bypass graft: Secondary | ICD-10-CM | POA: Diagnosis not present

## 2012-03-11 DIAGNOSIS — I517 Cardiomegaly: Secondary | ICD-10-CM | POA: Diagnosis not present

## 2012-03-11 DIAGNOSIS — I700 Atherosclerosis of aorta: Secondary | ICD-10-CM | POA: Diagnosis not present

## 2012-03-11 LAB — PROTIME-INR
INR: 0.95 (ref 0.00–1.49)
Prothrombin Time: 12.6 seconds (ref 11.6–15.2)

## 2012-03-11 LAB — COMPREHENSIVE METABOLIC PANEL
CO2: 27 mEq/L (ref 19–32)
Calcium: 10 mg/dL (ref 8.4–10.5)
Creatinine, Ser: 0.75 mg/dL (ref 0.50–1.10)
GFR calc Af Amer: >90 mL/min (ref >90)
GFR calc non Af Amer: 80 mL/min — ABNORMAL LOW (ref >90)
Glucose, Bld: 136 mg/dL — ABNORMAL HIGH (ref 70–99)
Sodium: 142 mEq/L (ref 135–145)
Total Protein: 7.4 g/dL (ref 6.0–8.3)

## 2012-03-11 LAB — ABO/RH: ABO/RH(D): A POS

## 2012-03-11 LAB — URINALYSIS, ROUTINE W REFLEX MICROSCOPIC
Bilirubin Urine: NEGATIVE
Glucose, UA: NEGATIVE mg/dL
Hgb urine dipstick: NEGATIVE
Ketones, ur: NEGATIVE mg/dL
Protein, ur: NEGATIVE mg/dL
Urobilinogen, UA: 0.2 mg/dL (ref 0.0–1.0)

## 2012-03-11 LAB — CBC
Hemoglobin: 11.5 g/dL — ABNORMAL LOW (ref 12.0–15.0)
MCH: 26.6 pg (ref 26.0–34.0)
MCHC: 31.8 g/dL (ref 30.0–36.0)
MCV: 83.6 fL (ref 78.0–100.0)
RBC: 4.33 MIL/uL (ref 3.87–5.11)

## 2012-03-11 LAB — SURGICAL PCR SCREEN
MRSA, PCR: NEGATIVE
Staphylococcus aureus: NEGATIVE

## 2012-03-11 LAB — URINE MICROSCOPIC-ADD ON

## 2012-03-11 NOTE — Progress Notes (Signed)
Note urinalysis -viewable in  Epic.W. Kennon Portela

## 2012-03-11 NOTE — Pre-Procedure Instructions (Addendum)
03-11-12 EKG-1'14 Epic. CXR done today. 03-11-12 1430- Note faxed urinalysis viewable in Epic to [phone]. W. Kennon Portela

## 2012-03-13 NOTE — H&P (Signed)
TOTAL KNEE ADMISSION H&P  Patient is being admitted for left total knee arthroplasty.  Subjective:  Chief Complaint:left knee pain.  HPI: Diana Wood, 79 y.o. female, has a history of pain and functional disability in the left knee due to arthritis and has failed non-surgical conservative treatments for greater than 12 weeks to includeNSAID's and/or analgesics, corticosteriod injections and activity modification.  Onset of symptoms was gradual, starting 3 years ago with gradually worsening course since that time. The patient noted prior procedures on the knee to include  arthroscopy and menisectomy on the left knee(s).  Patient currently rates pain in the left knee(s) at 6 out of 10 with activity. Patient has night pain, worsening of pain with activity and weight bearing, pain that interferes with activities of daily living, pain with passive range of motion, crepitus and joint swelling.  Patient has evidence of periarticular osteophytes and joint space narrowing by imaging studies. There is no active infection.  Patient Active Problem List   Diagnosis Date Noted  . BRONCHITIS NOT SPECIFIED AS ACUTE OR CHRONIC 01/11/2009  . DEGENERATIVE JOINT DISEASE, BOTH KNEES, SEVERE 01/11/2009  . COUGH 01/11/2009  . HYPOTHYROIDISM 07/02/2008  . DM 07/02/2008  . HYPERCHOLESTEROLEMIA 07/02/2008  . HYPERLIPIDEMIA 07/02/2008  . OBESITY 07/02/2008  . ANEMIA 07/02/2008  . HYPERTENSION 07/02/2008  . CAD 07/02/2008  . PLEURAL EFFUSION 07/02/2008  . GERD 07/02/2008  . HIATAL HERNIA 07/02/2008  . CHEST PAIN 07/02/2008  . UTERINE CANCER, HX OF 07/02/2008   Past Medical History  Diagnosis Date  . Hyperlipidemia   . Hypertension   . CAD (coronary artery disease)   . Chest pain   . Anemia   . Hypothyroid   . Hiatal hernia   . Pleural effusion   . Diabetes mellitus   . Hypercholesterolemia   . GERD (gastroesophageal reflux disease)   . Obesity   . Uterine cancer   . Deep vein thrombosis     Past  Surgical History  Procedure Laterality Date  . Endoscopic vein harvesting from both thighs      Evelene Croon MD  . Bilateral knee surgeries    . Cataract extraction, bilateral    . Surgery on 2 fingers      r HAND  . Bilateral feet surgery    . Excision cyst debridement fusion distal interphalangeal joint ring finger  with mini acutrak screw 20 mm in length    . Hysterectomy  2000    dx of uterine cancer  . Abdominal hysterectomy    . Eye surgery      corneal transplant -right  . Coronary artery bypass graft      x 4-11'08-Dr. Bartle([facility])  . Joint replacement      right knee replacement-Dr. supple    Current outpatient prescriptions: acetaminophen (TYLENOL) 500 MG tablet, Take 500 mg by mouth every 6 (six) hours as needed for pain., Disp: , Rfl: ;   fenofibrate (TRICOR) 145 MG tablet, Take 145 mg by mouth every morning. , Disp: , Rfl: ;  levothyroxine (SYNTHROID, LEVOTHROID) 125 MCG tablet, Take 125 mcg by mouth every morning. , Disp: , Rfl: ;   losartan (COZAAR) 25 MG tablet, Take 25 mg by mouth 2 (two) times daily. , Disp: , Rfl:  metFORMIN (GLUMETZA) 500 MG (MOD) 24 hr tablet, Take 500 mg by mouth 3 (three) times daily. , Disp: , Rfl: ;   metoprolol (TOPROL-XL) 50 MG 24 hr tablet, Take 50 mg by mouth every morning. , Disp: ,  Rfl: ;  omeprazole (PRILOSEC) 20 MG capsule, Take 20 mg by mouth daily.  , Disp: , Rfl:   No Known Allergies  History  Substance Use Topics  . Smoking status: Never Smoker   . Smokeless tobacco: Not on file  . Alcohol Use: No    Family History  Problem Relation Age of Onset  . Cancer Mother   . Diabetes Mother   . Blindness Brother     partial from menigitis     Review of Systems  Constitutional: Negative.   HENT: Positive for hearing loss. Negative for ear pain, nosebleeds, congestion, sore throat, neck pain, tinnitus and ear discharge.   Eyes: Negative.   Respiratory: Positive for cough. Negative for hemoptysis, sputum production, shortness of  breath, wheezing and stridor.   Cardiovascular: Negative.   Gastrointestinal: Negative.   Genitourinary: Negative.   Musculoskeletal: Positive for joint pain. Negative for myalgias, back pain and falls.       Left knee pain  Skin: Negative.   Neurological: Negative.  Negative for headaches.  Endo/Heme/Allergies: Negative.   Psychiatric/Behavioral: Negative.     Objective:  Physical Exam  Constitutional: She is oriented to person, place, and time. She appears well-developed and well-nourished. No distress.  HENT:  Head: Normocephalic and atraumatic.  Right Ear: External ear normal.  Left Ear: External ear normal.  Nose: Nose normal.  Mouth/Throat: Oropharynx is clear and moist.  Eyes: Conjunctivae and EOM are normal.  Neck: Normal range of motion. Neck supple. No tracheal deviation present. No thyromegaly present.  Cardiovascular: Normal rate, regular rhythm, normal heart sounds and intact distal pulses.   No murmur heard. Respiratory: Effort normal and breath sounds normal. No respiratory distress. She has no wheezes. She exhibits no tenderness.  GI: Soft. Bowel sounds are normal. She exhibits no distension and no mass. There is no tenderness.  Musculoskeletal:       Right hip: Normal.       Left hip: Normal.       Right knee: She exhibits normal range of motion and no effusion. No tenderness found.       Left knee: She exhibits decreased range of motion and deformity. She exhibits no effusion. Tenderness found. Medial joint line and lateral joint line tenderness noted.       Right lower leg: She exhibits no tenderness and no swelling.       Left lower leg: She exhibits no tenderness and no swelling.  Evaluation of her right knee shows full extension now. Her range is about 0 to 115. There is no instability. The left knee varus deformity, range 5 to 120. Marked crepitus on range of motion. Tender medial greater than lateral with no instability noted.  Lymphadenopathy:    She has  no cervical adenopathy.  Neurological: She is alert and oriented to person, place, and time. She has normal strength and normal reflexes. No sensory deficit.  Skin: No rash noted. She is not diaphoretic. No erythema.  Psychiatric: She has a normal mood and affect. Her behavior is normal.    Pulse: 75 (Regular) BP: 171/79 (Sitting, Left Arm, Standard)  Estimated body mass index is 34.41 kg/(m^2) as calculated from the following:   Height as of 01/17/10: 5\' 1"  (1.549 m).   Weight as of 01/17/11: 82.555 kg (182 lb).   Imaging Review Plain radiographs demonstrate moderate degenerative joint disease of the left knee(s). The overall alignment ismild varus. The bone quality appears to be good for age and reported activity  level.  Assessment/Plan:  End stage arthritis, left knee   The patient history, physical examination, clinical judgment of the provider and imaging studies are consistent with end stage degenerative joint disease of the left knee(s) and total knee arthroplasty is deemed medically necessary. The treatment options including medical management, injection therapy arthroscopy and arthroplasty were discussed at length. The risks and benefits of total knee arthroplasty were presented and reviewed. The risks due to aseptic loosening, infection, stiffness, patella tracking problems, thromboembolic complications and other imponderables were discussed. The patient acknowledged the explanation, agreed to proceed with the plan and consent was signed. Patient is being admitted for inpatient treatment for surgery, pain control, PT, OT, prophylactic antibiotics, VTE prophylaxis, progressive ambulation and ADL's and discharge planning. The patient is planning to be discharged home with home health services     [place], [place]

## 2012-03-15 NOTE — Anesthesia Preprocedure Evaluation (Addendum)
Anesthesia Evaluation  Patient identified by MRN, date of birth, ID band Patient awake    Reviewed: Allergy & Precautions, H&P , NPO status , Patient's Chart, lab work & pertinent test results  Airway Mallampati: III TM Distance: >3 FB Neck ROM: Full    Dental  (+) Teeth Intact, Poor Dentition and Dental Advisory Given   Pulmonary neg pulmonary ROS,  breath sounds clear to auscultation  Pulmonary exam normal       Cardiovascular hypertension, Pt. on medications and Pt. on home beta blockers + CAD, + CABG and DVT Rhythm:Regular Rate:Normal     Neuro/Psych negative neurological ROS  negative psych ROS   GI/Hepatic Neg liver ROS, hiatal hernia, GERD-  Medicated,  Endo/Other  diabetes, Type 2, Oral Hypoglycemic AgentsHypothyroidism   Renal/GU negative Renal ROS   Hx uterine cancer negative genitourinary   Musculoskeletal negative musculoskeletal ROS (+)   Abdominal   Peds  Hematology negative hematology ROS (+)   Anesthesia Other Findings   Reproductive/Obstetrics negative OB ROS                          Anesthesia Physical Anesthesia Plan  ASA: III  Anesthesia Plan: General   Post-op Pain Management:    Induction: Intravenous  Airway Management Planned: Oral ETT  Additional Equipment:   Intra-op Plan:   Post-operative Plan:   Informed Consent: I have reviewed the patients History and Physical, chart, labs and discussed the procedure including the risks, benefits and alternatives for the proposed anesthesia with the patient or authorized representative who has indicated his/her understanding and acceptance.   Dental advisory given  Plan Discussed with: CRNA  Anesthesia Plan Comments:        Anesthesia Quick Evaluation

## 2012-03-16 ENCOUNTER — Encounter (HOSPITAL_COMMUNITY): Payer: Self-pay | Admitting: *Deleted

## 2012-03-16 ENCOUNTER — Inpatient Hospital Stay (HOSPITAL_COMMUNITY): Payer: Medicare Other | Admitting: Anesthesiology

## 2012-03-16 ENCOUNTER — Inpatient Hospital Stay (HOSPITAL_COMMUNITY)
Admission: RE | Admit: 2012-03-16 | Discharge: 2012-03-19 | DRG: 470 | Disposition: A | Discharge status: Home Health | Payer: Medicare Other | Source: Ambulatory Visit | Attending: Orthopedic Surgery | Admitting: Orthopedic Surgery

## 2012-03-16 ENCOUNTER — Encounter (HOSPITAL_COMMUNITY): Payer: Self-pay | Admitting: Anesthesiology

## 2012-03-16 ENCOUNTER — Encounter (HOSPITAL_COMMUNITY)
Admission: RE | Disposition: A | Discharge status: Home Health | Payer: Self-pay | Source: Ambulatory Visit | Attending: Orthopedic Surgery

## 2012-03-16 DIAGNOSIS — E871 Hypo-osmolality and hyponatremia: Secondary | ICD-10-CM

## 2012-03-16 DIAGNOSIS — I10 Essential (primary) hypertension: Secondary | ICD-10-CM | POA: Diagnosis present

## 2012-03-16 DIAGNOSIS — I2510 Atherosclerotic heart disease of native coronary artery without angina pectoris: Secondary | ICD-10-CM | POA: Diagnosis present

## 2012-03-16 DIAGNOSIS — E039 Hypothyroidism, unspecified: Secondary | ICD-10-CM | POA: Diagnosis present

## 2012-03-16 DIAGNOSIS — M1710 Unilateral primary osteoarthritis, unspecified knee: Principal | ICD-10-CM | POA: Diagnosis present

## 2012-03-16 DIAGNOSIS — Z86718 Personal history of other venous thrombosis and embolism: Secondary | ICD-10-CM

## 2012-03-16 DIAGNOSIS — IMO0002 Reserved for concepts with insufficient information to code with codable children: Secondary | ICD-10-CM | POA: Diagnosis not present

## 2012-03-16 DIAGNOSIS — K219 Gastro-esophageal reflux disease without esophagitis: Secondary | ICD-10-CM | POA: Diagnosis present

## 2012-03-16 DIAGNOSIS — E7800 Pure hypercholesterolemia, unspecified: Secondary | ICD-10-CM | POA: Diagnosis present

## 2012-03-16 DIAGNOSIS — Z79899 Other long term (current) drug therapy: Secondary | ICD-10-CM

## 2012-03-16 DIAGNOSIS — E119 Type 2 diabetes mellitus without complications: Secondary | ICD-10-CM | POA: Diagnosis present

## 2012-03-16 DIAGNOSIS — Z6829 Body mass index (BMI) 29.0-29.9, adult: Secondary | ICD-10-CM | POA: Diagnosis not present

## 2012-03-16 DIAGNOSIS — Z8542 Personal history of malignant neoplasm of other parts of uterus: Secondary | ICD-10-CM | POA: Diagnosis not present

## 2012-03-16 DIAGNOSIS — D62 Acute posthemorrhagic anemia: Secondary | ICD-10-CM

## 2012-03-16 DIAGNOSIS — Z96659 Presence of unspecified artificial knee joint: Secondary | ICD-10-CM | POA: Diagnosis not present

## 2012-03-16 DIAGNOSIS — E785 Hyperlipidemia, unspecified: Secondary | ICD-10-CM | POA: Diagnosis present

## 2012-03-16 DIAGNOSIS — M179 Osteoarthritis of knee, unspecified: Secondary | ICD-10-CM | POA: Diagnosis present

## 2012-03-16 HISTORY — PX: TOTAL KNEE ARTHROPLASTY: SHX125

## 2012-03-16 LAB — TYPE AND SCREEN
ABO/RH(D): A POS
Antibody Screen: NEGATIVE

## 2012-03-16 LAB — GLUCOSE, CAPILLARY
Glucose-Capillary: 136 mg/dL — ABNORMAL HIGH (ref 70–99)
Glucose-Capillary: 175 mg/dL — ABNORMAL HIGH (ref 70–99)
Glucose-Capillary: 141 mg/dL — ABNORMAL HIGH (ref 70–99)

## 2012-03-16 SURGERY — ARTHROPLASTY, KNEE, TOTAL
Anesthesia: General | Site: Knee | Laterality: Left | Wound class: Clean

## 2012-03-16 MED ORDER — ROCURONIUM BROMIDE 100 MG/10ML IV SOLN
INTRAVENOUS | Status: DC | PRN
  Administered 2012-03-16: 40 mg via INTRAVENOUS

## 2012-03-16 MED ORDER — SODIUM CHLORIDE 0.9 % IR SOLN
Status: DC | PRN
  Administered 2012-03-16: 1000 mL

## 2012-03-16 MED ORDER — PROPOFOL 10 MG/ML IV BOLUS
INTRAVENOUS | Status: DC | PRN
  Administered 2012-03-16: 180 mg via INTRAVENOUS

## 2012-03-16 MED ORDER — MORPHINE SULFATE 2 MG/ML IJ SOLN
1.0000 mg | INTRAMUSCULAR | Status: DC | PRN
  Administered 2012-03-16: 1 mg via INTRAVENOUS
  Filled 2012-03-16: qty 1

## 2012-03-16 MED ORDER — DEXAMETHASONE 6 MG PO TABS
10.0000 mg | ORAL_TABLET | Freq: Once | ORAL | Status: AC
  Administered 2012-03-17: 10 mg via ORAL
  Filled 2012-03-16: qty 1

## 2012-03-16 MED ORDER — MENTHOL 3 MG MT LOZG
1.0000 | LOZENGE | OROMUCOSAL | Status: DC | PRN
  Filled 2012-03-16: qty 9

## 2012-03-16 MED ORDER — RIVAROXABAN 10 MG PO TABS
10.0000 mg | ORAL_TABLET | Freq: Every day | ORAL | Status: DC
  Administered 2012-03-17 – 2012-03-19 (×3): 10 mg via ORAL
  Filled 2012-03-16 (×4): qty 1

## 2012-03-16 MED ORDER — PANTOPRAZOLE SODIUM 40 MG PO TBEC
40.0000 mg | DELAYED_RELEASE_TABLET | Freq: Every day | ORAL | Status: DC
  Administered 2012-03-16: 40 mg via ORAL
  Filled 2012-03-16 (×2): qty 1

## 2012-03-16 MED ORDER — LEVOTHYROXINE SODIUM 125 MCG PO TABS
125.0000 ug | ORAL_TABLET | Freq: Every day | ORAL | Status: DC
  Administered 2012-03-17 – 2012-03-19 (×3): 125 ug via ORAL
  Filled 2012-03-16 (×4): qty 1

## 2012-03-16 MED ORDER — ACETAMINOPHEN 650 MG RE SUPP: 650.0000 mg | Freq: Four times a day (QID) | RECTAL | Status: DC | PRN

## 2012-03-16 MED ORDER — PROMETHAZINE HCL 25 MG/ML IJ SOLN: 6.2500 mg | INTRAMUSCULAR | Status: DC | PRN

## 2012-03-16 MED ORDER — LACTATED RINGERS IV SOLN
INTRAVENOUS | Status: DC
Start: 2012-03-16 — End: 2012-03-16

## 2012-03-16 MED ORDER — CEFAZOLIN SODIUM 1-5 GM-% IV SOLN
1.0000 g | Freq: Four times a day (QID) | INTRAVENOUS | Status: AC
  Administered 2012-03-16 (×2): 1 g via INTRAVENOUS
  Filled 2012-03-16 (×2): qty 50

## 2012-03-16 MED ORDER — FENTANYL CITRATE 0.05 MG/ML IJ SOLN
INTRAMUSCULAR | Status: DC | PRN
  Administered 2012-03-16: 100 ug via INTRAVENOUS

## 2012-03-16 MED ORDER — TRAMADOL HCL 50 MG PO TABS
50.0000 mg | ORAL_TABLET | Freq: Four times a day (QID) | ORAL | Status: DC | PRN
  Administered 2012-03-17 (×2): 100 mg via ORAL
  Filled 2012-03-16: qty 2
  Filled 2012-03-16 (×2): qty 1

## 2012-03-16 MED ORDER — NEOSTIGMINE METHYLSULFATE 1 MG/ML IJ SOLN
INTRAMUSCULAR | Status: DC | PRN
  Administered 2012-03-16: 4 mg via INTRAVENOUS

## 2012-03-16 MED ORDER — BISACODYL 10 MG RE SUPP: 10.0000 mg | Freq: Every day | RECTAL | Status: DC | PRN

## 2012-03-16 MED ORDER — HYDROMORPHONE HCL PF 1 MG/ML IJ SOLN: 0.2500 mg | INTRAMUSCULAR | Status: DC | PRN

## 2012-03-16 MED ORDER — GLYCOPYRROLATE 0.2 MG/ML IJ SOLN
INTRAMUSCULAR | Status: DC | PRN
  Administered 2012-03-16: 0.6 mg via INTRAVENOUS

## 2012-03-16 MED ORDER — MIDAZOLAM HCL 5 MG/5ML IJ SOLN
INTRAMUSCULAR | Status: DC | PRN
  Administered 2012-03-16: 2 mg via INTRAVENOUS

## 2012-03-16 MED ORDER — HYDROMORPHONE HCL PF 1 MG/ML IJ SOLN
INTRAMUSCULAR | Status: DC | PRN
  Administered 2012-03-16: 1 mg via INTRAVENOUS
  Administered 2012-03-16 (×2): 0.5 mg via INTRAVENOUS

## 2012-03-16 MED ORDER — METOCLOPRAMIDE HCL 10 MG PO TABS: 5.0000 mg | ORAL_TABLET | Freq: Three times a day (TID) | ORAL | Status: DC | PRN

## 2012-03-16 MED ORDER — CEFAZOLIN SODIUM-DEXTROSE 2-3 GM-% IV SOLR
INTRAVENOUS | Status: DC | PRN
  Administered 2012-03-16: 2 g via INTRAVENOUS

## 2012-03-16 MED ORDER — METOPROLOL SUCCINATE ER 50 MG PO TB24
50.0000 mg | ORAL_TABLET | Freq: Every morning | ORAL | Status: DC
  Administered 2012-03-17 – 2012-03-19 (×3): 50 mg via ORAL
  Filled 2012-03-16 (×3): qty 1

## 2012-03-16 MED ORDER — LACTATED RINGERS IV SOLN
INTRAVENOUS | Status: DC | PRN
  Administered 2012-03-16 (×2): via INTRAVENOUS

## 2012-03-16 MED ORDER — DEXAMETHASONE SODIUM PHOSPHATE 10 MG/ML IJ SOLN: 10.0000 mg | Freq: Once | INTRAMUSCULAR | Status: AC

## 2012-03-16 MED ORDER — ACETAMINOPHEN 10 MG/ML IV SOLN
1000.0000 mg | Freq: Four times a day (QID) | INTRAVENOUS | Status: AC
  Administered 2012-03-16 – 2012-03-17 (×4): 1000 mg via INTRAVENOUS
  Filled 2012-03-16 (×7): qty 100

## 2012-03-16 MED ORDER — SODIUM CHLORIDE 0.9 % IJ SOLN
INTRAMUSCULAR | Status: DC | PRN
  Administered 2012-03-16: 50 mL via INTRAVENOUS

## 2012-03-16 MED ORDER — CHLORHEXIDINE GLUCONATE 4 % EX LIQD
60.0000 mL | Freq: Once | CUTANEOUS | Status: DC
  Filled 2012-03-16: qty 60

## 2012-03-16 MED ORDER — METHOCARBAMOL 100 MG/ML IJ SOLN: 500.0000 mg | Freq: Four times a day (QID) | INTRAVENOUS | Status: DC | PRN

## 2012-03-16 MED ORDER — DIPHENHYDRAMINE HCL 12.5 MG/5ML PO ELIX: 12.5000 mg | ORAL_SOLUTION | ORAL | Status: DC | PRN

## 2012-03-16 MED ORDER — INSULIN ASPART 100 UNIT/ML ~~LOC~~ SOLN
0.0000 [IU] | Freq: Three times a day (TID) | SUBCUTANEOUS | Status: DC
  Administered 2012-03-17: 3 [IU] via SUBCUTANEOUS
  Administered 2012-03-17: 5 [IU] via SUBCUTANEOUS
  Administered 2012-03-18 – 2012-03-19 (×4): 2 [IU] via SUBCUTANEOUS

## 2012-03-16 MED ORDER — LIDOCAINE HCL (CARDIAC) 20 MG/ML IV SOLN
INTRAVENOUS | Status: DC | PRN
  Administered 2012-03-16: 50 mg via INTRAVENOUS

## 2012-03-16 MED ORDER — 0.9 % SODIUM CHLORIDE (POUR BTL) OPTIME
TOPICAL | Status: DC | PRN
  Administered 2012-03-16: 1000 mL

## 2012-03-16 MED ORDER — DOCUSATE SODIUM 100 MG PO CAPS
100.0000 mg | ORAL_CAPSULE | Freq: Two times a day (BID) | ORAL | Status: DC
  Administered 2012-03-16 – 2012-03-19 (×5): 100 mg via ORAL

## 2012-03-16 MED ORDER — ACETAMINOPHEN 325 MG PO TABS: 650.0000 mg | ORAL_TABLET | Freq: Four times a day (QID) | ORAL | Status: DC | PRN

## 2012-03-16 MED ORDER — LACTATED RINGERS IV SOLN: INTRAVENOUS | Status: DC

## 2012-03-16 MED ORDER — ONDANSETRON HCL 4 MG PO TABS: 4.0000 mg | ORAL_TABLET | Freq: Four times a day (QID) | ORAL | Status: DC | PRN

## 2012-03-16 MED ORDER — METHOCARBAMOL 500 MG PO TABS
500.0000 mg | ORAL_TABLET | Freq: Four times a day (QID) | ORAL | Status: DC | PRN
  Administered 2012-03-16 – 2012-03-19 (×6): 500 mg via ORAL
  Filled 2012-03-16 (×6): qty 1

## 2012-03-16 MED ORDER — POLYETHYLENE GLYCOL 3350 17 G PO PACK: 17.0000 g | PACK | Freq: Every day | ORAL | Status: DC | PRN

## 2012-03-16 MED ORDER — FLEET ENEMA 7-19 GM/118ML RE ENEM: 1.0000 | ENEMA | Freq: Once | RECTAL | Status: AC | PRN

## 2012-03-16 MED ORDER — ONDANSETRON HCL 4 MG/2ML IJ SOLN
INTRAMUSCULAR | Status: DC | PRN
  Administered 2012-03-16: 4 mg via INTRAVENOUS

## 2012-03-16 MED ORDER — METOCLOPRAMIDE HCL 5 MG/ML IJ SOLN: 5.0000 mg | Freq: Three times a day (TID) | INTRAMUSCULAR | Status: DC | PRN

## 2012-03-16 MED ORDER — OXYCODONE HCL 5 MG PO TABS
5.0000 mg | ORAL_TABLET | ORAL | Status: DC | PRN
  Administered 2012-03-16: 10 mg via ORAL
  Administered 2012-03-16: 5 mg via ORAL
  Administered 2012-03-17: 10 mg via ORAL
  Administered 2012-03-18 – 2012-03-19 (×3): 5 mg via ORAL
  Filled 2012-03-16 (×2): qty 2
  Filled 2012-03-16 (×4): qty 1
  Filled 2012-03-16: qty 2

## 2012-03-16 MED ORDER — FENOFIBRATE 160 MG PO TABS
160.0000 mg | ORAL_TABLET | Freq: Every day | ORAL | Status: DC
  Administered 2012-03-17 – 2012-03-19 (×3): 160 mg via ORAL
  Filled 2012-03-16 (×3): qty 1

## 2012-03-16 MED ORDER — SODIUM CHLORIDE 0.45 % IV SOLN
INTRAVENOUS | Status: DC
  Administered 2012-03-16: 09:00:00 via INTRAVENOUS

## 2012-03-16 MED ORDER — PHENOL 1.4 % MT LIQD
1.0000 | OROMUCOSAL | Status: DC | PRN
  Filled 2012-03-16: qty 177

## 2012-03-16 MED ORDER — METFORMIN HCL ER 500 MG PO TB24
500.0000 mg | ORAL_TABLET | Freq: Three times a day (TID) | ORAL | Status: DC
  Administered 2012-03-16 (×2): 500 mg via ORAL
  Filled 2012-03-16 (×6): qty 1

## 2012-03-16 MED ORDER — BUPIVACAINE LIPOSOME 1.3 % IJ SUSP
20.0000 mL | Freq: Once | INTRAMUSCULAR | Status: AC
  Administered 2012-03-16: 20 mL
  Filled 2012-03-16: qty 20

## 2012-03-16 MED ORDER — CEFAZOLIN SODIUM-DEXTROSE 2-3 GM-% IV SOLR: 2.0000 g | INTRAVENOUS | Status: DC

## 2012-03-16 MED ORDER — ONDANSETRON HCL 4 MG/2ML IJ SOLN
4.0000 mg | Freq: Four times a day (QID) | INTRAMUSCULAR | Status: DC | PRN
  Administered 2012-03-17: 4 mg via INTRAVENOUS
  Filled 2012-03-16: qty 2

## 2012-03-16 MED ORDER — ACETAMINOPHEN 10 MG/ML IV SOLN: 1000.0000 mg | Freq: Once | INTRAVENOUS | Status: DC

## 2012-03-16 MED ORDER — SODIUM CHLORIDE 0.9 % IV SOLN: INTRAVENOUS | Status: DC

## 2012-03-16 MED ORDER — LOSARTAN POTASSIUM 25 MG PO TABS
25.0000 mg | ORAL_TABLET | Freq: Two times a day (BID) | ORAL | Status: DC
  Administered 2012-03-16 (×2): 25 mg via ORAL
  Filled 2012-03-16 (×4): qty 1

## 2012-03-16 SURGICAL SUPPLY — 55 items
BAG SPEC THK2 15X12 ZIP CLS (MISCELLANEOUS) ×1
BAG ZIPLOCK 12X15 (MISCELLANEOUS) ×2 IMPLANT
BANDAGE ELASTIC 6 VELCRO ST LF (GAUZE/BANDAGES/DRESSINGS) ×2 IMPLANT
BANDAGE ESMARK 6X9 LF (GAUZE/BANDAGES/DRESSINGS) ×1 IMPLANT
BLADE SAG 18X100X1.27 (BLADE) ×2 IMPLANT
BLADE SAW SGTL 11.0X1.19X90.0M (BLADE) ×2 IMPLANT
BNDG CMPR 9X6 STRL LF SNTH (GAUZE/BANDAGES/DRESSINGS) ×1
BNDG ESMARK 6X9 LF (GAUZE/BANDAGES/DRESSINGS) ×2
BOWL SMART MIX CTS (DISPOSABLE) ×2 IMPLANT
CEMENT HV SMART SET (Cement) ×4 IMPLANT
CLOTH BEACON ORANGE TIMEOUT ST (SAFETY) ×2 IMPLANT
CUFF TOURN SGL QUICK 34 (TOURNIQUET CUFF) ×2
CUFF TRNQT CYL 34X4X40X1 (TOURNIQUET CUFF) ×1 IMPLANT
DRAPE EXTREMITY T 121X128X90 (DRAPE) ×2 IMPLANT
DRAPE POUCH INSTRU U-SHP 10X18 (DRAPES) ×2 IMPLANT
DRAPE U-SHAPE 47X51 STRL (DRAPES) ×2 IMPLANT
DRSG ADAPTIC 3X8 NADH LF (GAUZE/BANDAGES/DRESSINGS) ×2 IMPLANT
DURAPREP 26ML APPLICATOR (WOUND CARE) ×2 IMPLANT
ELECT REM PT RETURN 9FT ADLT (ELECTROSURGICAL) ×2
ELECTRODE REM PT RTRN 9FT ADLT (ELECTROSURGICAL) ×1 IMPLANT
EVACUATOR 1/8 PVC DRAIN (DRAIN) ×2 IMPLANT
FACESHIELD LNG OPTICON STERILE (SAFETY) ×10 IMPLANT
GLOVE BIO SURGEON STRL SZ7.5 (GLOVE) ×2 IMPLANT
GLOVE BIO SURGEON STRL SZ8 (GLOVE) ×2 IMPLANT
GLOVE BIOGEL PI IND STRL 8 (GLOVE) ×2 IMPLANT
GLOVE BIOGEL PI INDICATOR 8 (GLOVE) ×2
GLOVE SURG SS PI 6.5 STRL IVOR (GLOVE) ×4 IMPLANT
GOWN STRL NON-REIN LRG LVL3 (GOWN DISPOSABLE) ×4 IMPLANT
GOWN STRL REIN XL XLG (GOWN DISPOSABLE) ×2 IMPLANT
HANDPIECE INTERPULSE COAX TIP (DISPOSABLE) ×2
IMMOBILIZER KNEE 20 (SOFTGOODS) ×2
IMMOBILIZER KNEE 20 THIGH 36 (SOFTGOODS) ×1 IMPLANT
KIT BASIN OR (CUSTOM PROCEDURE TRAY) ×2 IMPLANT
MANIFOLD NEPTUNE II (INSTRUMENTS) ×2 IMPLANT
NDL SAFETY ECLIPSE 18X1.5 (NEEDLE) ×1 IMPLANT
NEEDLE HYPO 18GX1.5 SHARP (NEEDLE) ×2
NS IRRIG 1000ML POUR BTL (IV SOLUTION) ×2 IMPLANT
PACK TOTAL JOINT (CUSTOM PROCEDURE TRAY) ×2 IMPLANT
PAD ABD 7.5X8 STRL (GAUZE/BANDAGES/DRESSINGS) ×1 IMPLANT
PADDING CAST COTTON 6X4 STRL (CAST SUPPLIES) ×4 IMPLANT
POSITIONER SURGICAL ARM (MISCELLANEOUS) ×2 IMPLANT
SET HNDPC FAN SPRY TIP SCT (DISPOSABLE) ×1 IMPLANT
SPONGE GAUZE 4X4 12PLY (GAUZE/BANDAGES/DRESSINGS) ×1 IMPLANT
STRIP CLOSURE SKIN 1/2X4 (GAUZE/BANDAGES/DRESSINGS) ×4 IMPLANT
STRIP CLOSURE SKIN 1/4X4 (GAUZE/BANDAGES/DRESSINGS) ×1 IMPLANT
SUCTION FRAZIER 12FR DISP (SUCTIONS) ×2 IMPLANT
SUT MNCRL AB 4-0 PS2 18 (SUTURE) ×2 IMPLANT
SUT VIC AB 2-0 CT1 27 (SUTURE) ×6
SUT VIC AB 2-0 CT1 TAPERPNT 27 (SUTURE) ×3 IMPLANT
SUT VLOC 180 0 24IN GS25 (SUTURE) ×2 IMPLANT
SYR 50ML LL SCALE MARK (SYRINGE) ×2 IMPLANT
TOWEL OR 17X26 10 PK STRL BLUE (TOWEL DISPOSABLE) ×4 IMPLANT
TRAY FOLEY CATH 14FRSI W/METER (CATHETERS) ×2 IMPLANT
WATER STERILE IRR 1500ML POUR (IV SOLUTION) ×3 IMPLANT
WRAP KNEE MAXI GEL POST OP (GAUZE/BANDAGES/DRESSINGS) ×4 IMPLANT

## 2012-03-16 NOTE — Op Note (Signed)
Pre-operative diagnosis- Osteoarthritis  Left knee(s)  Post-operative diagnosis- Osteoarthritis Left knee(s)  Procedure-  Left  Total Knee Arthroplasty  Surgeon- Gus Rankin. Aluisio, MD  Assistant- Dimitri Ped, PA-C   Anesthesia-  General EBL-* No blood loss amount entered *  Drains Hemovac  Tourniquet time-  Total Tourniquet Time Documented: Thigh (Left) - 39 minutes Total: Thigh (Left) - 39 minutes    Complications- None  Condition-PACU - hemodynamically stable.   Brief Clinical Note  Diana Wood is a 79 y.o. year old female with end stage OA of her left knee with progressively worsening pain and dysfunction. She has constant pain, with activity and at rest and significant functional deficits with difficulties even with ADLs. She has had extensive non-op management including analgesics, injections of cortisone and viscosupplements, and home exercise program, but remains in significant pain with significant dysfunction. Radiographs show bone on bone arthritis medial and patellofemoral. She presents now for left Total Knee Arthroplasty.    Procedure in detail---   The patient is brought into the operating room and positioned supine on the operating table. After successful administration of  General,   a tourniquet is placed high on the  Left thigh(s) and the lower extremity is prepped and draped in the usual sterile fashion. Time out is performed by the operating team and then the  Left lower extremity is wrapped in Esmarch, knee flexed and the tourniquet inflated to 300 mmHg.       A midline incision is made with a ten blade through the subcutaneous tissue to the level of the extensor mechanism. A fresh blade is used to make a medial parapatellar arthrotomy. Soft tissue over the proximal medial tibia is subperiosteally elevated to the joint line with a knife and into the semimembranosus bursa with a Cobb elevator. Soft tissue over the proximal lateral tibia is elevated with attention  being paid to avoiding the patellar tendon on the tibial tubercle. The patella is everted, knee flexed 90 degrees and the ACL and PCL are removed. Findings are bone on bone medial and patellofemoral with large medial osteophytes.        The drill is used to create a starting hole in the distal femur and the canal is thoroughly irrigated with sterile saline to remove the fatty contents. The 5 degree Left  valgus alignment guide is placed into the femoral canal and the distal femoral cutting block is pinned to remove 10 mm off the distal femur. Resection is made with an oscillating saw.      The tibia is subluxed forward and the menisci are removed. The extramedullary alignment guide is placed referencing proximally at the medial aspect of the tibial tubercle and distally along the second metatarsal axis and tibial crest. The block is pinned to remove 2mm off the more deficient medial  side. Resection is made with an oscillating saw. Size 2is the most appropriate size for the tibia and the proximal tibia is prepared with the modular drill and keel punch for that size.      The femoral sizing guide is placed and size 2 is most appropriate. Rotation is marked off the epicondylar axis and confirmed by creating a rectangular flexion gap at 90 degrees. The size 2 cutting block is pinned in this rotation and the anterior, posterior and chamfer cuts are made with the oscillating saw. The intercondylar block is then placed and that cut is made.      Trial size 2 tibial component, trial size 2  posterior stabilized femur and a 10  mm posterior stabilized rotating platform insert trial is placed. Full extension is achieved with excellent varus/valgus and anterior/posterior balance throughout full range of motion. The patella is everted and thickness measured to be 22  mm. Free hand resection is taken to 12 mm, a 35 template is placed, lug holes are drilled, trial patella is placed, and it tracks normally. Osteophytes are  removed off the posterior femur with the trial in place. All trials are removed and the cut bone surfaces prepared with pulsatile lavage. Cement is mixed and once ready for implantation, the size 2 tibial implant, size  2 posterior stabilized femoral component, and the size 35 patella are cemented in place and the patella is held with the clamp. The trial insert is placed and the knee held in full extension. The Exparel (20 ml mixed with 50 ml saline) is injected into the extensor mechanism, posterior capsule, medial and lateral gutters and subcutaneous tissues.  All extruded cement is removed and once the cement is hard the permanent 10 mm posterior stabilized rotating platform insert is placed into the tibial tray.      The wound is copiously irrigated with saline solution and the extensor mechanism closed over a hemovac drain with #1 PDS suture. The tourniquet is released for a total tourniquet time of 39  minutes. Flexion against gravity is 140 degrees and the patella tracks normally. Subcutaneous tissue is closed with 2.0 vicryl and subcuticular with running 4.0 Monocryl. The incision is cleaned and dried and steri-strips and a bulky sterile dressing are applied. The limb is placed into a knee immobilizer and the patient is awakened and transported to recovery in stable condition.      Please note that a surgical assistant was a medical necessity for this procedure in order to perform it in a safe and expeditious manner. Surgical assistant was necessary to retract the ligaments and vital neurovascular structures to prevent injury to them and also necessary for proper positioning of the limb to allow for anatomic placement of the prosthesis.   Gus Rankin Aluisio, MD    03/16/2012, 8:14 AM

## 2012-03-16 NOTE — Progress Notes (Signed)
Utilization review completed.  

## 2012-03-16 NOTE — Interval H&P Note (Signed)
History and Physical Interval Note:  03/16/2012 6:45 AM  Diana Wood  has presented today for surgery, with the diagnosis of oa left knee   The various methods of treatment have been discussed with the patient and family. After consideration of risks, benefits and other options for treatment, the patient has consented to  Procedure(s): TOTAL KNEE ARTHROPLASTY (Left) as a surgical intervention .  The patient's history has been reviewed, patient examined, no change in status, stable for surgery.  I have reviewed the patient's chart and labs.  Questions were answered to the patient's satisfaction.     Loanne Drilling

## 2012-03-16 NOTE — Transfer of Care (Signed)
Immediate Anesthesia Transfer of Care Note  Patient: Diana Wood  Procedure(s) Performed: Procedure(s): TOTAL KNEE ARTHROPLASTY (Left)  Patient Location: PACU  Anesthesia Type:General  Level of Consciousness: awake, alert  and oriented  Airway & Oxygen Therapy: Patient Spontanous Breathing and Patient connected to face mask oxygen  Post-op Assessment: Report given to PACU RN and Post -op Vital signs reviewed and stable  Post vital signs: Reviewed and stable  Complications: No apparent anesthesia complications

## 2012-03-16 NOTE — Anesthesia Postprocedure Evaluation (Signed)
Anesthesia Post Note  Patient: Diana Wood  Procedure(s) Performed: Procedure(s) (LRB): TOTAL KNEE ARTHROPLASTY (Left)  Anesthesia type: General  Patient location: PACU  Post pain: Pain level controlled  Post assessment: Post-op Vital signs reviewed  Last Vitals:  Filed Vitals:   03/16/12 0920  BP:   Pulse: 76  Temp:   Resp: 15    Post vital signs: Reviewed  Level of consciousness: sedated  Complications: No apparent anesthesia complications

## 2012-03-17 ENCOUNTER — Encounter (HOSPITAL_COMMUNITY): Payer: Self-pay | Admitting: Orthopedic Surgery

## 2012-03-17 DIAGNOSIS — I10 Essential (primary) hypertension: Secondary | ICD-10-CM | POA: Diagnosis not present

## 2012-03-17 DIAGNOSIS — D62 Acute posthemorrhagic anemia: Secondary | ICD-10-CM | POA: Insufficient documentation

## 2012-03-17 DIAGNOSIS — E871 Hypo-osmolality and hyponatremia: Secondary | ICD-10-CM | POA: Insufficient documentation

## 2012-03-17 DIAGNOSIS — E119 Type 2 diabetes mellitus without complications: Secondary | ICD-10-CM | POA: Diagnosis not present

## 2012-03-17 LAB — BASIC METABOLIC PANEL
Calcium: 8.6 mg/dL (ref 8.4–10.5)
GFR calc Af Amer: >90 mL/min (ref >90)
GFR calc non Af Amer: 82 mL/min — ABNORMAL LOW (ref >90)
Potassium: 3.8 mEq/L (ref 3.5–5.1)
Sodium: 134 mEq/L — ABNORMAL LOW (ref 135–145)

## 2012-03-17 LAB — CBC
MCH: 26.9 pg (ref 26.0–34.0)
MCHC: 32.7 g/dL (ref 30.0–36.0)
Platelets: 284 10*3/uL (ref 150–400)
RDW: 14.5 % (ref 11.5–15.5)

## 2012-03-17 LAB — GLUCOSE, CAPILLARY
Glucose-Capillary: 163 mg/dL — ABNORMAL HIGH (ref 70–99)
Glucose-Capillary: 206 mg/dL — ABNORMAL HIGH (ref 70–99)
Glucose-Capillary: 157 mg/dL — ABNORMAL HIGH (ref 70–99)

## 2012-03-17 MED ORDER — NON FORMULARY: 20.0000 mg | Freq: Every day | Status: DC

## 2012-03-17 MED ORDER — LOSARTAN POTASSIUM 50 MG PO TABS
50.0000 mg | ORAL_TABLET | Freq: Every day | ORAL | Status: DC
  Administered 2012-03-17: 50 mg via ORAL
  Filled 2012-03-17 (×2): qty 1

## 2012-03-17 MED ORDER — OMEPRAZOLE 20 MG PO CPDR
20.0000 mg | DELAYED_RELEASE_CAPSULE | Freq: Every day | ORAL | Status: DC
  Administered 2012-03-17 – 2012-03-19 (×3): 20 mg via ORAL
  Filled 2012-03-17 (×3): qty 1

## 2012-03-17 NOTE — Progress Notes (Signed)
Physical Therapy Treatment Patient Details Name: Diana Wood MRN: [id number] DOB: 12-06-1932 Today's Date: 03/17/2012 Time: 4098-1191 PT Time Calculation (min): 14 min  PT Assessment / Plan / Recommendation Comments on Treatment Session       Follow Up Recommendations  Home health PT     Does the patient have the potential to tolerate intense rehabilitation     Barriers to Discharge        Equipment Recommendations  None recommended by PT    Recommendations for Other Services OT consult  Frequency 7X/week   Plan      Precautions / Restrictions Precautions Precautions: Knee Required Braces or Orthoses: Knee Immobilizer - Left Knee Immobilizer - Left: Discontinue once straight leg raise with < 10 degree lag Restrictions Weight Bearing Restrictions: No Other Position/Activity Restrictions: WBAT   Pertinent Vitals/Pain 3-4/10    Mobility  Bed Mobility Bed Mobility: Sit to Supine Sit to Supine: 4: Min assist Details for Bed Mobility Assistance: cues for sequence and use of R LE to self assist Transfers Transfers: Sit to Stand;Stand to Sit Sit to Stand: 5: Supervision Stand to Sit: 5: Supervision;4: Min guard;To bed Details for Transfer Assistance: cues for LE management and use of UEs to self assist Ambulation/Gait Ambulation/Gait Assistance: 4: Min assist Ambulation Distance (Feet): 132 Feet Assistive device: Rolling walker Ambulation/Gait Assistance Details: cues for posture, sequence, position from RW Gait Pattern: Step-to pattern;Decreased step length - left;Decreased step length - right;Decreased stance time - left    Exercises     PT Diagnosis:    PT Problem List:   PT Treatment Interventions:     PT Goals Acute Rehab PT Goals PT Goal Formulation: With patient Time For Goal Achievement: 03/21/12 Potential to Achieve Goals: Good Pt will go Supine/Side to Sit: with supervision PT Goal: Supine/Side to Sit - Progress: Goal set today Pt will go Sit to  Supine/Side: with supervision PT Goal: Sit to Supine/Side - Progress: Goal set today Pt will go Sit to Stand: with supervision PT Goal: Sit to Stand - Progress: Progressing toward goal Pt will go Stand to Sit: with supervision PT Goal: Stand to Sit - Progress: Progressing toward goal Pt will Ambulate: 51 - 150 feet;with supervision;with rolling walker PT Goal: Ambulate - Progress: Progressing toward goal Pt will Go Up / Down Stairs: 1-2 stairs;with min assist;with least restrictive assistive device PT Goal: Up/Down Stairs - Progress: Goal set today  Visit Information  Last PT Received On: 03/17/12 Assistance Needed: +1    Subjective Data  Subjective: I'm doing better this afternoon Patient Stated Goal: Resume previous lifestyle with decreased pain   Cognition  Cognition Overall Cognitive Status: Appears within functional limits for tasks assessed/performed Arousal/Alertness: Awake/alert Orientation Level: Appears intact for tasks assessed Behavior During Session: [facility] for tasks performed    Balance  Balance Balance Assessed: Yes Static Sitting Balance Static Sitting - Balance Support: Feet supported;No upper extremity supported Static Sitting - Level of Assistance: 7: Independent Static Standing Balance Static Standing - Balance Support: No upper extremity supported;During functional activity Static Standing - Level of Assistance: 6: Modified independent (Device/Increase time)  End of Session PT - End of Session Equipment Utilized During Treatment: Left knee immobilizer Activity Tolerance: Patient tolerated treatment well Patient left: in bed;with call bell/phone within reach;with family/visitor present Nurse Communication: Mobility status CPM Left Knee CPM Left Knee: On   GP     BRADSHAW,HUNTER 03/17/2012, 3:33 PM

## 2012-03-17 NOTE — Care Management (Signed)
[facility]  Home health is following this pt.  Diana Wood  [phone].

## 2012-03-17 NOTE — Evaluation (Signed)
Physical Therapy Evaluation Patient Details Name: Diana Wood MRN: [id number] DOB: 1932-05-25 Today's Date: 03/17/2012 Time: 0865-7846 PT Time Calculation (min): 29 min  PT Assessment / Plan / Recommendation Clinical Impression  Pt s/p L TKR presents with decreased L LE strength/ROM  and post op pain limiting functional mobility    PT Assessment  Patient needs continued PT services    Follow Up Recommendations  Home health PT    Does the patient have the potential to tolerate intense rehabilitation      Barriers to Discharge None      Equipment Recommendations  None recommended by PT    Recommendations for Other Services OT consult   Frequency 7X/week    Precautions / Restrictions Precautions Precautions: Knee Required Braces or Orthoses: Knee Immobilizer - Left Knee Immobilizer - Left: Discontinue once straight leg raise with < 10 degree lag Restrictions Weight Bearing Restrictions: No Other Position/Activity Restrictions: WBAT   Pertinent Vitals/Pain 4/10; premed, cold packs provided      Mobility  Bed Mobility Bed Mobility: Supine to Sit Supine to Sit: 3: Mod assist Details for Bed Mobility Assistance: cues for sequence and use of R LE to self assist Transfers Transfers: Sit to Stand;Stand to Sit Sit to Stand: 5: Supervision;From chair/3-in-1 Stand to Sit: 5: Supervision;4: Min guard;To chair/3-in-1 Details for Transfer Assistance: cues for LE management and use of UEs to self assist Ambulation/Gait Ambulation/Gait Assistance: 3: Mod assist;4: Min assist Ambulation Distance (Feet): 33 Feet Assistive device: Rolling walker Ambulation/Gait Assistance Details: cues for posture, sequence, positiion from RW and stride length Gait Pattern: Step-to pattern;Decreased step length - left;Decreased step length - right;Decreased stance time - left    Exercises Total Joint Exercises Ankle Circles/Pumps: AROM;10 reps;Supine;Both Quad Sets: AROM;Both;10  reps;Supine Heel Slides: AAROM;10 reps;Supine;Left Straight Leg Raises: AAROM;Left;10 reps;Supine   PT Diagnosis: Difficulty walking  PT Problem List: Decreased strength;Decreased range of motion;Decreased activity tolerance;Decreased mobility;Decreased knowledge of use of DME;Pain PT Treatment Interventions: DME instruction;Gait training;Stair training;Functional mobility training;Therapeutic activities;Therapeutic exercise;Patient/family education   PT Goals Acute Rehab PT Goals PT Goal Formulation: With patient Time For Goal Achievement: 03/21/12 Potential to Achieve Goals: Good Pt will go Supine/Side to Sit: with supervision PT Goal: Supine/Side to Sit - Progress: Goal set today Pt will go Sit to Supine/Side: with supervision PT Goal: Sit to Supine/Side - Progress: Goal set today Pt will go Sit to Stand: with supervision PT Goal: Sit to Stand - Progress: Goal set today Pt will go Stand to Sit: with supervision PT Goal: Stand to Sit - Progress: Goal set today Pt will Ambulate: 51 - 150 feet;with supervision;with rolling walker PT Goal: Ambulate - Progress: Goal set today Pt will Go Up / Down Stairs: 1-2 stairs;with min assist;with least restrictive assistive device PT Goal: Up/Down Stairs - Progress: Goal set today  Visit Information  Last PT Received On: 03/17/12 Assistance Needed: +1    Subjective Data  Subjective: I had the other one done 3 yrs ago but it did not do so well Patient Stated Goal: Resume previous lifestyle with decreased pain   Prior Functioning  Home Living Lives With: Spouse Available Help at Discharge: Family;Other (Comment) (Husband) Type of Home: House Home Access: Stairs to enter;Ramped entrance Entrance Stairs-Number of Steps: 1 STE Home Layout: Two level;Able to live on main level with bedroom/bathroom;Full bath on main level Bathroom Shower/Tub: Walk-in shower;Door [employer] Toilet: Standard Bathroom Accessibility: Yes How Accessible:  Accessible via walker Home Adaptive Equipment: Grab bars in shower;Shower chair  with back;Bedside commode/3-in-1;Walker - rolling;Straight cane;Reacher Prior Function Level of Independence: Independent Able to Take Stairs?: Yes Driving: Yes Vocation: Retired Musician: No difficulties Dominant Hand: Right    Cognition  Cognition Overall Cognitive Status: Appears within functional limits for tasks assessed/performed Arousal/Alertness: Awake/alert Orientation Level: Appears intact for tasks assessed Behavior During Session: [facility] for tasks performed    Extremity/Trunk Assessment Right Upper Extremity Assessment RUE ROM/Strength/Tone: [facility] for tasks assessed;Within functional levels RUE Coordination: WFL - gross/fine motor Left Upper Extremity Assessment LUE ROM/Strength/Tone: Within functional levels;WFL for tasks assessed LUE Coordination: WFL - gross/fine motor Right Lower Extremity Assessment RLE ROM/Strength/Tone: Deficits RLE ROM/Strength/Tone Deficits: 4/5 quads with AAROm at knee to 50 flex Left Lower Extremity Assessment LLE ROM/Strength/Tone: Deficits LLE ROM/Strength/Tone Deficits: 2/5 quads with AAROM at knee -10 - 35 Trunk Assessment Trunk Assessment: Normal   Balance Balance Balance Assessed: Yes Static Sitting Balance Static Sitting - Balance Support: Feet supported;No upper extremity supported Static Sitting - Level of Assistance: 7: Independent Static Standing Balance Static Standing - Balance Support: No upper extremity supported;During functional activity Static Standing - Level of Assistance: 6: Modified independent (Device/Increase time)  End of Session PT - End of Session Equipment Utilized During Treatment: Gait belt;Left knee immobilizer Activity Tolerance: Patient tolerated treatment well Patient left: in chair;with call bell/phone within reach;with family/visitor present Nurse Communication: Mobility status  GP      BRADSHAW,HUNTER 03/17/2012, 1:30 PM

## 2012-03-17 NOTE — Evaluation (Signed)
Occupational Therapy Evaluation Patient Details Name: Diana Wood MRN: [id number] DOB: [birth year] Today's Date: 03/17/2012 Time: 1308-6578 OT Time Calculation (min): 25 min  OT Assessment / Plan / Recommendation Clinical Impression  Pt is 79y/o female s/p L TKR whom is currently Min assist LLE bathe/dress and supervision-min guard assist for transfers on/off 3:1 over toilet. She plans to go home with husband & family PRN assist, has DME & long handled reacher. Family/daughter present during assessment. Pt reports no further OT needs at this time, will sign off.    OT Assessment  Patient does not need any further OT services    Follow Up Recommendations  No OT follow up    Barriers to Discharge      Equipment Recommendations  None recommended by OT    Recommendations for Other Services    Frequency       Precautions / Restrictions Precautions Precautions: Knee Required Braces or Orthoses: Knee Immobilizer - Left Restrictions Weight Bearing Restrictions: No   Pertinent Vitals/Pain No, denies pain.    ADL  Eating/Feeding: Performed;Independent Where Assessed - Eating/Feeding: Chair Grooming: Performed;Wash/dry hands;Supervision/safety Where Assessed - Grooming: Unsupported standing Upper Body Bathing: Simulated;Set up;Modified independent Where Assessed - Upper Body Bathing: Supported sitting;Unsupported sitting Lower Body Bathing: Simulated;Minimal assistance Where Assessed - Lower Body Bathing: Supported sit to stand Upper Body Dressing: Simulated;Modified independent;Set up Where Assessed - Upper Body Dressing: Supported sitting Lower Body Dressing: Performed;Minimal assistance Where Assessed - Lower Body Dressing: Supported sit to stand;Supported sitting Toilet Transfer: Performed;Supervision/safety;Min Pension scheme manager Method: Sit to Barista: Raised toilet seat with arms (or 3-in-1 over toilet) Toileting - Clothing Manipulation and  Hygiene: Performed;Supervision/safety Where Assessed - Toileting Clothing Manipulation and Hygiene: Sit on 3-in-1 or toilet;Sit to stand from 3-in-1 or toilet Tub/Shower Transfer Method: Not assessed Equipment Used: Rolling walker;Gait belt;Knee Immobilizer;Other (comment) (3:1 over toilet) Transfers/Ambulation Related to ADLs: Pt was supervision-Min guard assist during toilet transfer for LLE assist when sitting on 3:1 (secondary to KI). ADL Comments: Pt is 79y/o female s/p L TKR whom is currently Min assist LLE bathe/dress and supervision-min guard assist for transfers on/off 3:1 over toilet. She plans to go home with husband & family PRN assist, has DME & long handled reacher.    OT Diagnosis:    OT Problem List:   OT Treatment Interventions:     OT Goals    Visit Information  Last OT Received On: 03/17/12    Subjective Data  Subjective: "I've got all that stuff from the other one" (R TKA 3 years ago) Patient Stated Goal: Home w/ husband & family PRN assist   Prior Functioning     Home Living Lives With: Spouse Available Help at Discharge: Family;Other (Comment) (Husband) Type of Home: House Home Access: Stairs to enter;Ramped entrance Entrance Stairs-Number of Steps: 1 STE Home Layout: Two level;Able to live on main level with bedroom/bathroom;Full bath on main level Bathroom Shower/Tub: Walk-in shower;Door [employer] Toilet: Standard Bathroom Accessibility: Yes How Accessible: Accessible via walker Home Adaptive Equipment: Grab bars in shower;Shower chair with back;Bedside commode/3-in-1;Walker - rolling;Straight cane;Reacher Prior Function Level of Independence: Independent Driving: Yes Vocation: Retired Musician: No difficulties Dominant Hand: Right    Vision/Perception Vision - History Baseline Vision: Other (comment) (Has had cornea replacement right eye) Patient Visual Report: No change from baseline   Cognition  Cognition Overall  Cognitive Status: Appears within functional limits for tasks assessed/performed Arousal/Alertness: Awake/alert Orientation Level: Appears intact for tasks assessed Behavior During Session:  WFL for tasks performed    Extremity/Trunk Assessment Right Upper Extremity Assessment RUE ROM/Strength/Tone: [facility] for tasks assessed;Within functional levels RUE Coordination: WFL - gross/fine motor Left Upper Extremity Assessment LUE ROM/Strength/Tone: Within functional levels;WFL for tasks assessed LUE Coordination: WFL - gross/fine motor Trunk Assessment Trunk Assessment: Normal    Mobility Transfers Transfers: Sit to Stand;Stand to Sit Sit to Stand: 5: Supervision;From chair/3-in-1 Stand to Sit: 5: Supervision;4: Min guard;To chair/3-in-1       Balance Balance Balance Assessed: Yes Static Sitting Balance Static Sitting - Balance Support: Feet supported;No upper extremity supported Static Sitting - Level of Assistance: 7: Independent Static Standing Balance Static Standing - Balance Support: No upper extremity supported;During functional activity Static Standing - Level of Assistance: 6: Modified independent (Device/Increase time)   End of Session OT - End of Session Equipment Utilized During Treatment: Gait belt;Left knee immobilizer;Other (comment) (3:1 over toilet, RW) Activity Tolerance: Patient tolerated treatment well Patient left: in chair;with call bell/phone within reach;with family/visitor present Nurse Communication: Mobility status  GO     [name] 03/17/2012, 1:25 PM

## 2012-03-17 NOTE — Progress Notes (Signed)
Subjective: S/P L TKR. Seen on rounds to help with DM and HTN. No Major issues except pain in Knee that she discussed with Dr Lequita Halt.  Objective: Vital signs in last 24 hours: Temp:  [97.3 F (36.3 C)-98.6 F (37 C)] 98.6 F (37 C) (03/11 0523) Pulse Rate:  [61-81] 71 (03/11 0523) Resp:  [11-20] 16 (03/11 0523) BP: (143-177)/(75-93) 162/82 mmHg (03/11 0523) SpO2:  [96 %-100 %] 98 % (03/11 0523) Weight:  [78.019 kg (172 lb)-78.1 kg (172 lb 2.9 oz)] 78.019 kg (172 lb) (03/10 1420) Weight change:  Last BM Date: 03/15/12  CBG (last 3)   Recent Labs  03/16/12 1200 03/16/12 1714 03/16/12 2159  GLUCAP 175* 186* 141*    Intake/Output from previous day:  Intake/Output Summary (Last 24 hours) at 03/17/12 0722 Last data filed at 03/17/12 0550  Gross per 24 hour  Intake   3990 ml  Output   3080 ml  Net    910 ml   03/10 0701 - 03/11 0700 In: 3990 [P.O.:1040; I.V.:2800; IV Piggyback:150] Out: 3080 [Urine:2950; Drains:130]   Physical Exam General appearance: A and O Throat: oropharynx moist without erythema Resp: CTA Cardio: Reg. Sternotomy GI: soft, non-tender; bowel sounds normal; no masses,  no organomegaly Extremities: no clubbing, cyanosis. L Knee slightly Flexed and Ice Pack on.   Lab Results:  Recent Labs  03/17/12 0455  NA 134*  K 3.8  CL 99  CO2 24  GLUCOSE 167*  BUN 10  CREATININE 0.68  CALCIUM 8.6    No results found for this basename: AST, ALT, ALKPHOS, BILITOT, PROT, ALBUMIN,  in the last 72 hours   Recent Labs  03/17/12 0455  WBC 9.6  HGB 9.7*  HCT 29.7*  MCV 82.3  PLT 284    Lab Results  Component Value Date   INR 0.95 03/11/2012   INR 1.48 04/03/2009   INR 1.31 04/02/2009    No results found for this basename: CKTOTAL, CKMB, CKMBINDEX, TROPONINI,  in the last 72 hours  No results found for this basename: TSH, T4TOTAL, FREET3, T3FREE, THYROIDAB,  in the last 72 hours  No results found for this basename: VITAMINB12, FOLATE,  FERRITIN, TIBC, IRON, RETICCTPCT,  in the last 72 hours  Micro Results: Recent Results (from the past 240 hour(s))  SURGICAL PCR SCREEN     Status: None   Collection Time    03/11/12 10:15 AM      Result Value Range Status   MRSA, PCR NEGATIVE  NEGATIVE Final   Staphylococcus aureus NEGATIVE  NEGATIVE Final   Comment:            The Xpert SA Assay (FDA     approved for NASAL specimens     in patients over 65 years of age),     is one component of     a comprehensive surveillance     program.  Test performance has     been validated by [employer] for patients greater     than or equal to 22 year old.     It is not intended     to diagnose infection nor to     guide or monitor treatment.     Studies/Results: No results found.   Medications: Scheduled: . dexamethasone  10 mg Oral Once   Or  . dexamethasone  10 mg Intravenous Once  . docusate sodium  100 mg Oral BID  . fenofibrate  160 mg Oral Daily  .  insulin aspart  0-15 Units Subcutaneous TID WC  . levothyroxine  125 mcg Oral QAC breakfast  . losartan  25 mg Oral BID  . metFORMIN  500 mg Oral TID WC  . metoprolol succinate  50 mg Oral q morning - 10a  . pantoprazole  40 mg Oral Daily  . rivaroxaban  10 mg Oral Q breakfast   Continuous: . sodium chloride 75 mL/hr at 03/16/12 4098     Assessment/Plan: Principal Problem:   OA (osteoarthritis) of knee  L TKR - POD #1 - Per Dr Lequita Halt.  Doing well. Mild Post Op Anemia.  HTN - change losartan to 50 mg once per day  DM2 - CBGs high and ?S/P Dexamethasone.  Continue Metformin, DM diet and ISS  Recent Labs  03/16/12 1200 03/16/12 1714 03/16/12 2159  GLUCAP 175* 186* 141*   CAD - No angina. ID -  Anti-infectives   Start     Dose/Rate Route Frequency Ordered Stop   03/16/12 1400  ceFAZolin (ANCEF) IVPB 1 g/50 mL premix     1 g 100 mL/hr over 30 Minutes Intravenous Every 6 hours 03/16/12 0956 03/16/12 2050   03/16/12 0600  ceFAZolin (ANCEF) IVPB 2 g/50  mL premix  Status:  Discontinued     2 g 100 mL/hr over 30 Minutes Intravenous On call to O.R. 03/16/12 0507 03/16/12 0955     DVT Prophylaxis - on Oral anticoagulants.    LOS: 1 day   RUSSO,JOHN M 03/17/2012, 7:22 AM

## 2012-03-17 NOTE — Care Management Note (Signed)
    Page 1 of 1   03/17/2012     5:33:58 PM   CARE MANAGEMENT NOTE 03/17/2012  Patient:  Diana Wood, Diana Wood   Account Number:  [id number]  Date Initiated:  03/17/2012  Documentation initiated by:  Colleen Can  Subjective/Objective Assessment:   DX OSTEOARTHRITIS LEFT KNEE; TOTAL KNEE REPLACEMNT.    PRE-ARRANGED WITH [facility] FOR [facility] SERVICES.     Action/Plan:   CM SPOKE WITH PATIENT. Plans are for patient to return to her home in [place] where spouse will be caregiver. She already has RW and commode seat. Wants [facility] for hh services.   Anticipated DC Date:  03/17/2012   Anticipated DC Plan:  HOME W HOME HEALTH SERVICES  In-house referral  Clinical Social Worker      DC Planning Services  CM consult      [facility] Choice  HOME HEALTH   Choice offered to / List presented to:  C-1 Patient        HH arranged  HH-2 PT      [facility] agency  [facility]   Status of service:  Completed, signed off Medicare Important Message given?  NA - LOS <3 / Initial given by admissions (If response is "NO", the following Medicare IM given date fields will be blank) Date Medicare IM given:   Date Additional Medicare IM given:    Discharge Disposition:    Per UR Regulation:  Reviewed for med. necessity/level of care/duration of stay  If discussed at Long Length of Stay Meetings, dates discussed:    Comments:

## 2012-03-17 NOTE — Progress Notes (Signed)
   Subjective: 1 Day Post-Op Procedure(s) (LRB): TOTAL KNEE ARTHROPLASTY (Left) Patient reports pain as moderate last night. Patient seen in rounds with Dr. Lequita Halt. Patient is having problems with pain in the knee, requiring pain medications We will start therapy today.  Plan is to go Home after hospital stay.  Objective: Vital signs in last 24 hours: Temp:  [97.3 F (36.3 C)-98.6 F (37 C)] 98.6 F (37 C) (03/11 0523) Pulse Rate:  [61-81] 71 (03/11 0523) Resp:  [11-20] 16 (03/11 0523) BP: (143-177)/(75-93) 162/82 mmHg (03/11 0523) SpO2:  [96 %-100 %] 98 % (03/11 0523) Weight:  [78.019 kg (172 lb)-78.1 kg (172 lb 2.9 oz)] 78.019 kg (172 lb) (03/10 1420)  Intake/Output from previous day:  Intake/Output Summary (Last 24 hours) at 03/17/12 0802 Last data filed at 03/17/12 0550  Gross per 24 hour  Intake   2990 ml  Output   3080 ml  Net    -90 ml    Intake/Output this shift: UOP 1150 since MN +910  Labs:  Recent Labs  03/17/12 0455  HGB 9.7*    Recent Labs  03/17/12 0455  WBC 9.6  RBC 3.61*  HCT 29.7*  PLT 284    Recent Labs  03/17/12 0455  NA 134*  K 3.8  CL 99  CO2 24  BUN 10  CREATININE 0.68  GLUCOSE 167*  CALCIUM 8.6   No results found for this basename: LABPT, INR,  in the last 72 hours  EXAM General - Patient is Alert, Appropriate and Oriented Extremity - Neurovascular intact Sensation intact distally Dorsiflexion/Plantar flexion intact No cellulitis present Dressing - dressing C/D/I Motor Function - intact, moving foot and toes well on exam.  Hemovac pulled without difficulty.  Past Medical History  Diagnosis Date  . Hyperlipidemia   . Hypertension   . CAD (coronary artery disease)   . Chest pain   . Anemia   . Hypothyroid   . Hiatal hernia   . Pleural effusion   . Diabetes mellitus   . Hypercholesterolemia   . GERD (gastroesophageal reflux disease)   . Obesity   . Uterine cancer   . Deep vein thrombosis      Assessment/Plan: 1 Day Post-Op Procedure(s) (LRB): TOTAL KNEE ARTHROPLASTY (Left) Principal Problem:   OA (osteoarthritis) of knee Active Problems:   Postop Hyponatremia   Postoperative anemia due to acute blood loss  Estimated body mass index is 29.51 kg/(m^2) as calculated from the following:   Height as of this encounter: 5\' 4"  (1.626 m).   Weight as of this encounter: 78.019 kg (172 lb). Advance diet Up with therapy Plan for discharge tomorrow Discharge home with home health  DVT Prophylaxis - Xarelto Weight-Bearing as tolerated to left leg No vaccines. D/C O2 and Pulse OX and try on Room Air  PERKINS, ALEXZANDREW 03/17/2012, 8:02 AM

## 2012-03-18 DIAGNOSIS — E119 Type 2 diabetes mellitus without complications: Secondary | ICD-10-CM | POA: Diagnosis not present

## 2012-03-18 DIAGNOSIS — I10 Essential (primary) hypertension: Secondary | ICD-10-CM | POA: Diagnosis not present

## 2012-03-18 LAB — CBC
MCH: 26.5 pg (ref 26.0–34.0)
MCHC: 32.5 g/dL (ref 30.0–36.0)
MCV: 81.8 fL (ref 78.0–100.0)
Platelets: 322 10*3/uL (ref 150–400)
RBC: 3.73 MIL/uL — ABNORMAL LOW (ref 3.87–5.11)

## 2012-03-18 LAB — BASIC METABOLIC PANEL
CO2: 25 mEq/L (ref 19–32)
Calcium: 9.2 mg/dL (ref 8.4–10.5)
Creatinine, Ser: 0.67 mg/dL (ref 0.50–1.10)
GFR calc non Af Amer: 83 mL/min — ABNORMAL LOW (ref >90)
Glucose, Bld: 178 mg/dL — ABNORMAL HIGH (ref 70–99)
Sodium: 134 mEq/L — ABNORMAL LOW (ref 135–145)

## 2012-03-18 LAB — GLUCOSE, CAPILLARY
Glucose-Capillary: 149 mg/dL — ABNORMAL HIGH (ref 70–99)
Glucose-Capillary: 138 mg/dL — ABNORMAL HIGH (ref 70–99)

## 2012-03-18 MED ORDER — LOSARTAN POTASSIUM 50 MG PO TABS
50.0000 mg | ORAL_TABLET | Freq: Two times a day (BID) | ORAL | Status: DC
  Administered 2012-03-18 – 2012-03-19 (×2): 50 mg via ORAL
  Filled 2012-03-18 (×3): qty 1

## 2012-03-18 NOTE — Progress Notes (Signed)
   Subjective: 2 Days Post-Op Procedure(s) (LRB): TOTAL KNEE ARTHROPLASTY (Left) Patient reports pain as mild and moderate.   Patient seen in rounds for Dr. Lequita Halt.  She has had some intermittent nausea and not feeling well today. Patient is well, and has had no acute complaints or problems Plan is to go Home after hospital stay.  Objective: Vital signs in last 24 hours: Temp:  [99 F (37.2 C)-99.7 F (37.6 C)] 99 F (37.2 C) (03/12 0443) Pulse Rate:  [76-82] 82 (03/12 0443) Resp:  [16-18] 18 (03/12 0443) BP: (163-175)/(89-97) 175/97 mmHg (03/12 0443) SpO2:  [91 %] 91 % (03/12 0443)  Intake/Output from previous day:  Intake/Output Summary (Last 24 hours) at 03/18/12 1606 Last data filed at 03/18/12 1236  Gross per 24 hour  Intake   1320 ml  Output    300 ml  Net   1020 ml    Intake/Output this shift: Total I/O In: 720 [P.O.:720] Out: -   Labs:  Recent Labs  03/17/12 0455 03/18/12 0447  HGB 9.7* 9.9*    Recent Labs  03/17/12 0455 03/18/12 0447  WBC 9.6 13.4*  RBC 3.61* 3.73*  HCT 29.7* 30.5*  PLT 284 322    Recent Labs  03/17/12 0455 03/18/12 0447  NA 134* 134*  K 3.8 3.5  CL 99 98  CO2 24 25  BUN 10 13  CREATININE 0.68 0.67  GLUCOSE 167* 178*  CALCIUM 8.6 9.2   No results found for this basename: LABPT, INR,  in the last 72 hours  EXAM General - Patient is Alert, Appropriate and Oriented Extremity - Neurovascular intact Sensation intact distally Dorsiflexion/Plantar flexion intact No cellulitis present Dressing/Incision - clean, dry, no drainage, healing Motor Function - intact, moving foot and toes well on exam.   Past Medical History  Diagnosis Date  . Hyperlipidemia   . Hypertension   . CAD (coronary artery disease)   . Chest pain   . Anemia   . Hypothyroid   . Hiatal hernia   . Pleural effusion   . Diabetes mellitus   . Hypercholesterolemia   . GERD (gastroesophageal reflux disease)   . Obesity   . Uterine cancer   .  Deep vein thrombosis     Assessment/Plan: 2 Days Post-Op Procedure(s) (LRB): TOTAL KNEE ARTHROPLASTY (Left) Principal Problem:   OA (osteoarthritis) of knee Active Problems:   Postop Hyponatremia   Postoperative anemia due to acute blood loss  Estimated body mass index is 29.51 kg/(m^2) as calculated from the following:   Height as of this encounter: 5\' 4"  (1.626 m).   Weight as of this encounter: 78.019 kg (172 lb). Up with therapy Plan for discharge tomorrow Discharge home with home health  DVT Prophylaxis - Xarelto Weight-Bearing as tolerated to left leg  PERKINS, ALEXZANDREW 03/18/2012, 4:06 PM

## 2012-03-18 NOTE — Progress Notes (Signed)
Subjective: S/P L TKR. Seen on rounds to help with DM and HTN. Less pain - looks better. Denies feelings of illness. Had BM< and GI discomfort better  Objective: Vital signs in last 24 hours: Temp:  [98.2 F (36.8 C)-99.7 F (37.6 C)] 99 F (37.2 C) (03/12 0443) Pulse Rate:  [65-82] 82 (03/12 0443) Resp:  [16-18] 18 (03/12 0443) BP: (157-175)/(66-97) 175/97 mmHg (03/12 0443) SpO2:  [91 %-92 %] 91 % (03/12 0443) Weight change:  Last BM Date: 03/15/12  CBG (last 3)   Recent Labs  03/17/12 1716 03/17/12 2139 03/18/12 0730  GLUCAP 206* 157* 150*    Intake/Output from previous day:  Intake/Output Summary (Last 24 hours) at 03/18/12 0827 Last data filed at 03/18/12 0444  Gross per 24 hour  Intake    840 ml  Output    300 ml  Net    540 ml   03/11 0701 - 03/12 0700 In: 840 [P.O.:840] Out: 300 [Urine:300]   Physical Exam General appearance: A and O Throat: oropharynx moist without erythema Resp: CTA Cardio: Reg. Sternotomy GI: soft, non-tender; bowel sounds normal; no masses,  no organomegaly Extremities: no clubbing, cyanosis. L Knee without pack on   Lab Results:  Recent Labs  03/17/12 0455 03/18/12 0447  NA 134* 134*  K 3.8 3.5  CL 99 98  CO2 24 25  GLUCOSE 167* 178*  BUN 10 13  CREATININE 0.68 0.67  CALCIUM 8.6 9.2    No results found for this basename: AST, ALT, ALKPHOS, BILITOT, PROT, ALBUMIN,  in the last 72 hours   Recent Labs  03/17/12 0455 03/18/12 0447  WBC 9.6 13.4*  HGB 9.7* 9.9*  HCT 29.7* 30.5*  MCV 82.3 81.8  PLT 284 322    Lab Results  Component Value Date   INR 0.95 03/11/2012   INR 1.48 04/03/2009   INR 1.31 04/02/2009    No results found for this basename: CKTOTAL, CKMB, CKMBINDEX, TROPONINI,  in the last 72 hours  No results found for this basename: TSH, T4TOTAL, FREET3, T3FREE, THYROIDAB,  in the last 72 hours  No results found for this basename: VITAMINB12, FOLATE, FERRITIN, TIBC, IRON, RETICCTPCT,  in the last  72 hours  Micro Results: Recent Results (from the past 240 hour(s))  SURGICAL PCR SCREEN     Status: None   Collection Time    03/11/12 10:15 AM      Result Value Range Status   MRSA, PCR NEGATIVE  NEGATIVE Final   Staphylococcus aureus NEGATIVE  NEGATIVE Final   Comment:            The Xpert SA Assay (FDA     approved for NASAL specimens     in patients over 28 years of age),     is one component of     a comprehensive surveillance     program.  Test performance has     been validated by [employer] for patients greater     than or equal to 37 year old.     It is not intended     to diagnose infection nor to     guide or monitor treatment.     Studies/Results: No results found.   Medications: Scheduled: . docusate sodium  100 mg Oral BID  . fenofibrate  160 mg Oral Daily  . insulin aspart  0-15 Units Subcutaneous TID WC  . levothyroxine  125 mcg Oral QAC breakfast  . losartan  50 mg Oral Daily  . metoprolol succinate  50 mg Oral q morning - 10a  . omeprazole  20 mg Oral Daily  . rivaroxaban  10 mg Oral Q breakfast   Continuous:     Assessment/Plan: Principal Problem:   OA (osteoarthritis) of knee Active Problems:   Postop Hyponatremia   Postoperative anemia due to acute blood loss  L TKR - POD #2- Per Dr Lequita Halt.  Doing well. Mild Post Op Anemia is stable. Some bump in Leukocytosis but not ill?[name] Incentive Spirometer  HTN - change losartan to 50 mg BID as her BPs stink  DM2 - CBGs high and ?S/P Dexamethasone.  Continue Metformin, DM diet and ISS.  She needs weight loss and maybe more oral Hypoglycemics - will re-evaluate as outpatient   Recent Labs  03/17/12 1716 03/17/12 2139 03/18/12 0730  GLUCAP 206* 157* 150*   CAD - No angina.  DVT Prophylaxis - on Oral anticoagulants.    LOS: 2 days   Carlena Ruybal M 03/18/2012, 8:27 AM

## 2012-03-18 NOTE — Progress Notes (Signed)
Physical Therapy Treatment Patient Details Name: Diana Wood MRN: [id number] DOB: 07-09-1932 Today's Date: 03/18/2012 Time: 1308-6578 PT Time Calculation (min): 24 min  PT Assessment / Plan / Recommendation Comments on Treatment Session  Reviewed don/doff KI with pt and spouse    Follow Up Recommendations  Home health PT     Does the patient have the potential to tolerate intense rehabilitation     Barriers to Discharge        Equipment Recommendations  None recommended by PT    Recommendations for Other Services OT consult  Frequency 7X/week   Plan Discharge plan remains appropriate    Precautions / Restrictions Precautions Precautions: Knee Required Braces or Orthoses: Knee Immobilizer - Left Knee Immobilizer - Left: Discontinue once straight leg raise with < 10 degree lag Restrictions Weight Bearing Restrictions: No Other Position/Activity Restrictions: WBAT   Pertinent Vitals/Pain     Mobility  Bed Mobility Bed Mobility: Supine to Sit Supine to Sit: 4: Min assist;With rails Sit to Supine: 4: Min assist Details for Bed Mobility Assistance: cues for sequence and use of R LE to self assist Transfers Transfers: Sit to Stand;Stand to Sit Sit to Stand: 4: Min assist Stand to Sit: 4: Min guard Details for Transfer Assistance: cues for LE management and use of UEs to self assist Ambulation/Gait Ambulation/Gait Assistance: 4: Min guard Ambulation Distance (Feet): 115 Feet Assistive device: Rolling walker Ambulation/Gait Assistance Details: cues for posture, stride length, position from RW and sequence Gait Pattern: Step-to pattern;Decreased step length - left;Decreased step length - right;Decreased stance time - left Gait velocity: sloww    Exercises     PT Diagnosis:    PT Problem List:   PT Treatment Interventions:     PT Goals Acute Rehab PT Goals PT Goal Formulation: With patient Time For Goal Achievement: 03/21/12 Potential to Achieve Goals: Good Pt  will go Supine/Side to Sit: with supervision PT Goal: Supine/Side to Sit - Progress: Progressing toward goal Pt will go Sit to Supine/Side: with supervision PT Goal: Sit to Supine/Side - Progress: Progressing toward goal Pt will go Sit to Stand: with supervision PT Goal: Sit to Stand - Progress: Progressing toward goal Pt will go Stand to Sit: with supervision PT Goal: Stand to Sit - Progress: Progressing toward goal Pt will Ambulate: 51 - 150 feet;with supervision;with rolling walker PT Goal: Ambulate - Progress: Progressing toward goal  Visit Information  Last PT Received On: 03/18/12 Assistance Needed: +1    Subjective Data  Subjective: Ok, lets get it over with Patient Stated Goal: Resume previous lifestyle with decreased pain   Cognition  Cognition Overall Cognitive Status: Appears within functional limits for tasks assessed/performed Arousal/Alertness: Awake/alert Orientation Level: Appears intact for tasks assessed Behavior During Session: [facility] for tasks performed    Balance     End of Session PT - End of Session Equipment Utilized During Treatment: Left knee immobilizer Activity Tolerance: Patient tolerated treatment well Patient left: in bed;with call bell/phone within reach;with family/visitor present Nurse Communication: Mobility status   GP     BRADSHAW,HUNTER 03/18/2012, 3:41 PM

## 2012-03-18 NOTE — Progress Notes (Signed)
Physical Therapy Treatment Patient Details Name: Diana Wood MRN: [id number] DOB: Aug 04, 1932 Today's Date: 03/18/2012 Time: 4098-1191 PT Time Calculation (min): 30 min  PT Assessment / Plan / Recommendation Comments on Treatment Session       Follow Up Recommendations  Home health PT     Does the patient have the potential to tolerate intense rehabilitation     Barriers to Discharge        Equipment Recommendations  None recommended by PT    Recommendations for Other Services OT consult  Frequency 7X/week   Plan Discharge plan remains appropriate    Precautions / Restrictions Precautions Precautions: Knee Required Braces or Orthoses: Knee Immobilizer - Left Knee Immobilizer - Left: Discontinue once straight leg raise with < 10 degree lag Restrictions Weight Bearing Restrictions: No Other Position/Activity Restrictions: WBAT   Pertinent Vitals/Pain 4-5/10; premed, cold packs provided    Mobility  Bed Mobility Bed Mobility: Supine to Sit Sit to Supine: 4: Min assist Details for Bed Mobility Assistance: cues for sequence and use of R LE to self assist Transfers Transfers: Sit to Stand;Stand to Sit Sit to Stand: 4: Min assist Stand to Sit: 4: Min guard Details for Transfer Assistance: cues for LE management and use of UEs to self assist Ambulation/Gait Ambulation/Gait Assistance: 4: Min assist;4: Min [employer] social research officer (Feet): 111 Feet Assistive device: Rolling walker Ambulation/Gait Assistance Details: cues for sequence, posture, position from RW and stride length Gait Pattern: Step-to pattern;Decreased step length - left;Decreased step length - right;Decreased stance time - left    Exercises Total Joint Exercises Ankle Circles/Pumps: AROM;Supine;Both;20 reps Quad Sets: AROM;Both;Supine;20 reps Heel Slides: AAROM;Supine;Left;20 reps Straight Leg Raises: AAROM;Left;Supine;20 reps   PT Diagnosis:    PT Problem List:   PT Treatment Interventions:     PT  Goals Acute Rehab PT Goals PT Goal Formulation: With patient Time For Goal Achievement: 03/21/12 Potential to Achieve Goals: Good Pt will go Supine/Side to Sit: with supervision PT Goal: Supine/Side to Sit - Progress: Progressing toward goal Pt will go Sit to Supine/Side: with supervision PT Goal: Sit to Supine/Side - Progress: Progressing toward goal Pt will go Sit to Stand: with supervision PT Goal: Sit to Stand - Progress: Progressing toward goal Pt will go Stand to Sit: with supervision PT Goal: Stand to Sit - Progress: Progressing toward goal Pt will Ambulate: 51 - 150 feet;with supervision;with rolling walker PT Goal: Ambulate - Progress: Progressing toward goal  Visit Information  Last PT Received On: 03/18/12 Assistance Needed: +1    Subjective Data  Subjective: I;ll do what I have to, just don't make it hurt Patient Stated Goal: Resume previous lifestyle with decreased pain   Cognition  Cognition Overall Cognitive Status: Appears within functional limits for tasks assessed/performed Arousal/Alertness: Awake/alert Orientation Level: Appears intact for tasks assessed Behavior During Session: [facility] for tasks performed    Balance     End of Session PT - End of Session Equipment Utilized During Treatment: Left knee immobilizer Activity Tolerance: Patient tolerated treatment well Patient left: in bed;with call bell/phone within reach;with family/visitor present Nurse Communication: Mobility status   GP     BRADSHAW,HUNTER 03/18/2012, 12:32 PM

## 2012-03-19 LAB — GLUCOSE, CAPILLARY: Glucose-Capillary: 150 mg/dL — ABNORMAL HIGH (ref 70–99)

## 2012-03-19 LAB — CBC
HCT: 28.8 % — ABNORMAL LOW (ref 36.0–46.0)
MCV: 82.5 fL (ref 78.0–100.0)
Platelets: 339 10*3/uL (ref 150–400)
RBC: 3.49 MIL/uL — ABNORMAL LOW (ref 3.87–5.11)
WBC: 11.6 10*3/uL — ABNORMAL HIGH (ref 4.0–10.5)

## 2012-03-19 LAB — BASIC METABOLIC PANEL
CO2: 27 mEq/L (ref 19–32)
Chloride: 99 mEq/L (ref 96–112)
Creatinine, Ser: 0.76 mg/dL (ref 0.50–1.10)
Potassium: 3.9 mEq/L (ref 3.5–5.1)

## 2012-03-19 MED ORDER — OXYCODONE HCL 5 MG PO TABS: 5.0000 mg | ORAL_TABLET | ORAL | Status: DC | PRN

## 2012-03-19 MED ORDER — METHOCARBAMOL 500 MG PO TABS: 500.0000 mg | ORAL_TABLET | Freq: Four times a day (QID) | ORAL | Status: DC | PRN

## 2012-03-19 MED ORDER — TRAMADOL HCL 50 MG PO TABS: 50.0000 mg | ORAL_TABLET | Freq: Four times a day (QID) | ORAL | Status: DC | PRN

## 2012-03-19 MED ORDER — POLYSACCHARIDE IRON COMPLEX 150 MG PO CAPS
150.0000 mg | ORAL_CAPSULE | Freq: Every day | ORAL | Status: DC
  Administered 2012-03-19: 150 mg via ORAL
  Filled 2012-03-19: qty 1

## 2012-03-19 MED ORDER — RIVAROXABAN 10 MG PO TABS: 10.0000 mg | ORAL_TABLET | Freq: Every day | ORAL | Status: DC

## 2012-03-19 MED ORDER — POLYSACCHARIDE IRON COMPLEX 150 MG PO CAPS: 150.0000 mg | ORAL_CAPSULE | Freq: Every day | ORAL | Status: DC

## 2012-03-19 NOTE — Progress Notes (Signed)
Physical Therapy Treatment Patient Details Name: Diana Wood MRN: [id number] DOB: March 14, 1932 Today's Date: 03/19/2012 Time: 0865-7846 PT Time Calculation (min): 11 min  PT Assessment / Plan / Recommendation Comments on Treatment Session  Reviewed don/doff KI with pt.  Pt requests review of steps when spouse arrives    Follow Up Recommendations  Home health PT     Does the patient have the potential to tolerate intense rehabilitation     Barriers to Discharge        Equipment Recommendations  None recommended by PT    Recommendations for Other Services OT consult  Frequency 7X/week   Plan Discharge plan remains appropriate    Precautions / Restrictions Precautions Precautions: Knee Required Braces or Orthoses: Knee Immobilizer - Left Knee Immobilizer - Left: Discontinue once straight leg raise with < 10 degree lag Restrictions Weight Bearing Restrictions: No Other Position/Activity Restrictions: WBAT   Pertinent Vitals/Pain     Mobility  Bed Mobility Bed Mobility: Supine to Sit Supine to Sit: 4: Min assist;With rails Details for Bed Mobility Assistance: cues for sequence and use of R LE to self assist Transfers Transfers: Sit to Stand;Stand to Sit Sit to Stand: 4: Min assist Stand to Sit: 4: Min guard Details for Transfer Assistance: cues for LE management and use of UEs to self assist Ambulation/Gait Ambulation/Gait Assistance: 4: Min guard;5: Supervision Ambulation Distance (Feet): 55 Feet Assistive device: Rolling walker Ambulation/Gait Assistance Details: cues for posture and stride length Gait Pattern: Step-to pattern;Decreased step length - left;Decreased step length - right;Decreased stance time - left Gait velocity: sloww Stairs: Yes Stairs Assistance: 4: Min assist Stairs Assistance Details (indicate cue type and reason): sequence and foot placement cues Stair Management Technique: No rails;Backwards;Forwards;Step to pattern;With walker Number of  Stairs: 1 (once fwd and once bkwd)    Exercises Total Joint Exercises Ankle Circles/Pumps: AROM;Supine;Both;20 reps Quad Sets: AROM;Both;Supine;20 reps Heel Slides: AAROM;Supine;Left;20 reps Straight Leg Raises: AAROM;Left;Supine;20 reps   PT Diagnosis:    PT Problem List:   PT Treatment Interventions:     PT Goals Acute Rehab PT Goals PT Goal Formulation: With patient Time For Goal Achievement: 03/21/12 Potential to Achieve Goals: Good Pt will go Supine/Side to Sit: with supervision PT Goal: Supine/Side to Sit - Progress: Progressing toward goal Pt will go Sit to Supine/Side: with supervision PT Goal: Sit to Supine/Side - Progress: Progressing toward goal Pt will go Sit to Stand: with supervision PT Goal: Sit to Stand - Progress: Progressing toward goal Pt will go Stand to Sit: with supervision PT Goal: Stand to Sit - Progress: Progressing toward goal Pt will Ambulate: 51 - 150 feet;with supervision;with rolling walker PT Goal: Ambulate - Progress: Progressing toward goal Pt will Go Up / Down Stairs: 1-2 stairs;with min assist;with least restrictive assistive device PT Goal: Up/Down Stairs - Progress: Met  Visit Information  Last PT Received On: 03/19/12 Assistance Needed: +1    Subjective Data  Subjective: I'm going home today Patient Stated Goal: Resume previous lifestyle with decreased pain   Cognition  Cognition Overall Cognitive Status: Appears within functional limits for tasks assessed/performed Arousal/Alertness: Awake/alert Orientation Level: Appears intact for tasks assessed Behavior During Session: [facility] for tasks performed    Balance     End of Session PT - End of Session Equipment Utilized During Treatment: Left knee immobilizer Activity Tolerance: Patient tolerated treatment well Patient left: in chair;with call bell/phone within reach Nurse Communication: Mobility status CPM Left Knee CPM Left Knee: Off   GP  BRADSHAW,HUNTER 03/19/2012, 10:36  AM

## 2012-03-19 NOTE — Discharge Summary (Signed)
Physician Discharge Summary   Patient ID: Diana Wood MRN: [id number] DOB/AGE: [id number] y.o.  Admit date: 03/16/2012 Discharge date: 03/19/2012  Primary Diagnosis:  Osteoarthritis Left knee  Admission Diagnoses:  Past Medical History  Diagnosis Date  . Hyperlipidemia   . Hypertension   . CAD (coronary artery disease)   . Chest pain   . Anemia   . Hypothyroid   . Hiatal hernia   . Pleural effusion   . Diabetes mellitus   . Hypercholesterolemia   . GERD (gastroesophageal reflux disease)   . Obesity   . Uterine cancer   . Deep vein thrombosis    Discharge Diagnoses:   Principal Problem:   OA (osteoarthritis) of knee Active Problems:   Postop Hyponatremia   Postoperative anemia due to acute blood loss  Estimated body mass index is 29.51 kg/(m^2) as calculated from the following:   Height as of this encounter: 5\' 4"  (1.626 m).   Weight as of this encounter: 78.019 kg (172 lb).  Procedure:  Procedure(s) (LRB): TOTAL KNEE ARTHROPLASTY (Left)   Consults: Medical - Dr. Creola Corn  HPI: Diana Wood is a 79 y.o. year old female with end stage OA of her left knee with progressively worsening pain and dysfunction. She has constant pain, with activity and at rest and significant functional deficits with difficulties even with ADLs. She has had extensive non-op management including analgesics, injections of cortisone and viscosupplements, and home exercise program, but remains in significant pain with significant dysfunction. Radiographs show bone on bone arthritis medial and patellofemoral. She presents now for left Total Knee Arthroplasty.   Laboratory Data: Admission on 03/16/2012  Component Date Value Range Status  . Glucose-Capillary 03/16/2012 136* 70 - 99 mg/dL Final  . Glucose-Capillary 03/16/2012 140* 70 - 99 mg/dL Final  . Glucose-Capillary 03/16/2012 175* 70 - 99 mg/dL Final  . WBC 78/29/5621 9.6  4.0 - 10.5 K/uL Final  . RBC 03/17/2012 3.61* 3.87 - 5.11 MIL/uL  Final  . Hemoglobin 03/17/2012 9.7* 12.0 - 15.0 g/dL Final  . HCT 30/86/5784 29.7* 36.0 - 46.0 % Final  . MCV 03/17/2012 82.3  78.0 - 100.0 fL Final  . MCH 03/17/2012 26.9  26.0 - 34.0 pg Final  . MCHC 03/17/2012 32.7  30.0 - 36.0 g/dL Final  . RDW 69/62/9528 14.5  11.5 - 15.5 % Final  . Platelets 03/17/2012 284  150 - 400 K/uL Final  . Sodium 03/17/2012 134* 135 - 145 mEq/L Final  . Potassium 03/17/2012 3.8  3.5 - 5.1 mEq/L Final  . Chloride 03/17/2012 99  96 - 112 mEq/L Final  . CO2 03/17/2012 24  19 - 32 mEq/L Final  . Glucose, Bld 03/17/2012 167* 70 - 99 mg/dL Final  . BUN 41/32/4401 10  6 - 23 mg/dL Final  . Creatinine, Ser 03/17/2012 0.68  0.50 - 1.10 mg/dL Final  . Calcium 02/72/5366 8.6  8.4 - 10.5 mg/dL Final  . GFR calc non Af Amer 03/17/2012 82* >90 mL/min Final  . GFR calc Af Amer 03/17/2012 >90  >90 mL/min Final   Comment:                                 The eGFR has been calculated                          using the CKD EPI equation.  This calculation has not been                          validated in all clinical                          situations.                          eGFR's persistently                          <90 mL/min signify                          possible Chronic Kidney Disease.  . Glucose-Capillary 03/16/2012 186* 70 - 99 mg/dL Final  . Glucose-Capillary 03/16/2012 141* 70 - 99 mg/dL Final  . Glucose-Capillary 03/17/2012 163* 70 - 99 mg/dL Final  . Glucose-Capillary 03/17/2012 174* 70 - 99 mg/dL Final  . WBC 62/13/0865 13.4* 4.0 - 10.5 K/uL Final  . RBC 03/18/2012 3.73* 3.87 - 5.11 MIL/uL Final  . Hemoglobin 03/18/2012 9.9* 12.0 - 15.0 g/dL Final  . HCT 78/46/9629 30.5* 36.0 - 46.0 % Final  . MCV 03/18/2012 81.8  78.0 - 100.0 fL Final  . MCH 03/18/2012 26.5  26.0 - 34.0 pg Final  . MCHC 03/18/2012 32.5  30.0 - 36.0 g/dL Final  . RDW 52/84/1324 14.4  11.5 - 15.5 % Final  . Platelets 03/18/2012 322  150 - 400 K/uL Final  .  Sodium 03/18/2012 134* 135 - 145 mEq/L Final  . Potassium 03/18/2012 3.5  3.5 - 5.1 mEq/L Final  . Chloride 03/18/2012 98  96 - 112 mEq/L Final  . CO2 03/18/2012 25  19 - 32 mEq/L Final  . Glucose, Bld 03/18/2012 178* 70 - 99 mg/dL Final  . BUN 40/10/2723 13  6 - 23 mg/dL Final  . Creatinine, Ser 03/18/2012 0.67  0.50 - 1.10 mg/dL Final  . Calcium 36/64/4034 9.2  8.4 - 10.5 mg/dL Final  . GFR calc non Af Amer 03/18/2012 83* >90 mL/min Final  . GFR calc Af Amer 03/18/2012 >90  >90 mL/min Final   Comment:                                 The eGFR has been calculated                          using the CKD EPI equation.                          This calculation has not been                          validated in all clinical                          situations.                          eGFR's persistently                          <90  mL/min signify                          possible Chronic Kidney Disease.  . Glucose-Capillary 03/17/2012 206* 70 - 99 mg/dL Final  . Glucose-Capillary 03/17/2012 157* 70 - 99 mg/dL Final  . Glucose-Capillary 03/18/2012 150* 70 - 99 mg/dL Final  . Glucose-Capillary 03/18/2012 128* 70 - 99 mg/dL Final  . Glucose-Capillary 03/18/2012 149* 70 - 99 mg/dL Final  . WBC 98/11/9145 11.6* 4.0 - 10.5 K/uL Final  . RBC 03/19/2012 3.49* 3.87 - 5.11 MIL/uL Final  . Hemoglobin 03/19/2012 9.5* 12.0 - 15.0 g/dL Final  . HCT 82/95/6213 28.8* 36.0 - 46.0 % Final  . MCV 03/19/2012 82.5  78.0 - 100.0 fL Final  . MCH 03/19/2012 27.2  26.0 - 34.0 pg Final  . MCHC 03/19/2012 33.0  30.0 - 36.0 g/dL Final  . RDW 08/65/7846 14.6  11.5 - 15.5 % Final  . Platelets 03/19/2012 339  150 - 400 K/uL Final  . Sodium 03/19/2012 135  135 - 145 mEq/L Final  . Potassium 03/19/2012 3.9  3.5 - 5.1 mEq/L Final  . Chloride 03/19/2012 99  96 - 112 mEq/L Final  . CO2 03/19/2012 27  19 - 32 mEq/L Final  . Glucose, Bld 03/19/2012 151* 70 - 99 mg/dL Final  . BUN 96/29/5284 18  6 - 23 mg/dL Final  .  Creatinine, Ser 03/19/2012 0.76  0.50 - 1.10 mg/dL Final  . Calcium 13/24/4010 9.2  8.4 - 10.5 mg/dL Final  . GFR calc non Af Amer 03/19/2012 79* >90 mL/min Final  . GFR calc Af Amer 03/19/2012 >90  >90 mL/min Final   Comment:                                 The eGFR has been calculated                          using the CKD EPI equation.                          This calculation has not been                          validated in all clinical                          situations.                          eGFR's persistently                          <90 mL/min signify                          possible Chronic Kidney Disease.  . Glucose-Capillary 03/18/2012 138* 70 - 99 mg/dL Final  . Comment 1 27/25/3664 Notify RN   Final  . Glucose-Capillary 03/19/2012 150* 70 - 99 mg/dL Final  Hospital Outpatient Visit on 03/11/2012  Component Date Value Range Status  . MRSA, PCR 03/11/2012 NEGATIVE  NEGATIVE Final  . Staphylococcus aureus 03/11/2012 NEGATIVE  NEGATIVE Final   Comment:  The Xpert SA Assay (FDA                          approved for NASAL specimens                          in patients over 89 years of age),                          is one component of                          a comprehensive surveillance                          program.  Test performance has                          been validated by [employer] for patients greater                          than or equal to 14 year old.                          It is not intended                          to diagnose infection nor to                          guide or monitor treatment.  [name] aPTT 03/11/2012 29  24 - 37 seconds Final  . WBC 03/11/2012 7.6  4.0 - 10.5 K/uL Final  . RBC 03/11/2012 4.33  3.87 - 5.11 MIL/uL Final  . Hemoglobin 03/11/2012 11.5* 12.0 - 15.0 g/dL Final  . HCT 16/10/9602 36.2  36.0 - 46.0 % Final  . MCV 03/11/2012 83.6  78.0 - 100.0 fL Final  . MCH  03/11/2012 26.6  26.0 - 34.0 pg Final  . MCHC 03/11/2012 31.8  30.0 - 36.0 g/dL Final  . RDW 54/09/8117 14.3  11.5 - 15.5 % Final  . Platelets 03/11/2012 370  150 - 400 K/uL Final  . Sodium 03/11/2012 142  135 - 145 mEq/L Final  . Potassium 03/11/2012 4.6  3.5 - 5.1 mEq/L Final  . Chloride 03/11/2012 106  96 - 112 mEq/L Final  . CO2 03/11/2012 27  19 - 32 mEq/L Final  . Glucose, Bld 03/11/2012 136* 70 - 99 mg/dL Final  . BUN 14/78/2956 19  6 - 23 mg/dL Final  . Creatinine, Ser 03/11/2012 0.75  0.50 - 1.10 mg/dL Final  . Calcium 21/30/8657 10.0  8.4 - 10.5 mg/dL Final  . Total Protein 03/11/2012 7.4  6.0 - 8.3 g/dL Final  . Albumin 84/69/6295 4.0  3.5 - 5.2 g/dL Final  . AST 28/41/3244 40* 0 - 37 U/L Final  . ALT 03/11/2012 35  0 - 35 U/L Final  . Alkaline Phosphatase 03/11/2012 45  39 - 117 U/L Final  . Total Bilirubin 03/11/2012 0.3  0.3 - 1.2 mg/dL Final  .  GFR calc non Af Amer 03/11/2012 80* >90 mL/min Final  . GFR calc Af Amer 03/11/2012 >90  >90 mL/min Final   Comment:                                 The eGFR has been calculated                          using the CKD EPI equation.                          This calculation has not been                          validated in all clinical                          situations.                          eGFR's persistently                          <90 mL/min signify                          possible Chronic Kidney Disease.  [name] Prothrombin Time 03/11/2012 12.6  11.6 - 15.2 seconds Final  . INR 03/11/2012 0.95  0.00 - 1.49 Final  . ABO/RH(D) 03/11/2012 A POS   Final  . Antibody Screen 03/11/2012 NEG   Final  . Sample Expiration 03/11/2012 03/19/2012   Final  . Color, Urine 03/11/2012 YELLOW  YELLOW Final  . APPearance 03/11/2012 CLEAR  CLEAR Final  . Specific Gravity, Urine 03/11/2012 1.022  1.005 - 1.030 Final  . pH 03/11/2012 5.0  5.0 - 8.0 Final  . Glucose, UA 03/11/2012 NEGATIVE  NEGATIVE mg/dL Final  . Hgb urine dipstick  03/11/2012 NEGATIVE  NEGATIVE Final  . Bilirubin Urine 03/11/2012 NEGATIVE  NEGATIVE Final  . Ketones, ur 03/11/2012 NEGATIVE  NEGATIVE mg/dL Final  . Protein, ur 40/98/1191 NEGATIVE  NEGATIVE mg/dL Final  . Urobilinogen, UA 03/11/2012 0.2  0.0 - 1.0 mg/dL Final  . Nitrite 47/82/9562 NEGATIVE  NEGATIVE Final  . Leukocytes, UA 03/11/2012 SMALL* NEGATIVE Final  . ABO/RH(D) 03/11/2012 A POS   Final  . Squamous Epithelial / LPF 03/11/2012 RARE  RARE Final  . WBC, UA 03/11/2012 0-2  <3 WBC/hpf Final  . Bacteria, UA 03/11/2012 RARE  RARE Final  . Casts 03/11/2012 HYALINE CASTS* NEGATIVE Final     X-Rays:Dg Chest 2 View  03/11/2012  *RADIOLOGY REPORT*  Clinical Data: Preoperative evaluation prior to total knee replacement.  CHEST - 2 VIEW  Comparison: Chest x-ray 12/26/2008.  Findings: Lung volumes are normal.  No consolidative airspace disease.  No pleural effusions.  Pulmonary vasculature is normal. Mild cardiomegaly is unchanged.  A small amount of scarring in the left upper lobe adjacent to the heart is unchanged. Atherosclerosis in the thoracic aorta.  Status post median sternotomy for CABG.  IMPRESSION: 1.  No radiographic evidence of acute cardiopulmonary disease.  The appearance of the chest is essentially unchanged, as above.   Original Report Authenticated By: Trudie Reed, M.D.     EKG: Orders placed in visit  on 01/15/12  . EKG 12-LEAD     Hospital Course: Diana Wood is a 79 y.o. who was admitted to [facility]. They were brought to the operating room on 03/16/2012 and underwent Procedure(s): TOTAL KNEE ARTHROPLASTY.  Patient tolerated the procedure well and was later transferred to the recovery room and then to the orthopaedic floor for postoperative care.  They were given PO and IV analgesics for pain control following their surgery.  They were given 24 hours of postoperative antibiotics of  Anti-infectives   Start     Dose/Rate Route Frequency Ordered Stop   03/16/12  1400  ceFAZolin (ANCEF) IVPB 1 g/50 mL premix     1 g 100 mL/hr over 30 Minutes Intravenous Every 6 hours 03/16/12 0956 03/16/12 2050   03/16/12 0600  ceFAZolin (ANCEF) IVPB 2 g/50 mL premix  Status:  Discontinued     2 g 100 mL/hr over 30 Minutes Intravenous On call to O.R. 03/16/12 0507 03/16/12 0955     and started on DVT prophylaxis in the form of Xarelto.   PT and OT were ordered for total joint protocol.  Discharge planning consulted to help with postop disposition and equipment needs.  Patient had a tough night on the evening of surgery with moderate pain.  They started to get up OOB with therapy on day one. Hemovac drain was pulled without difficulty.  Continued to work with therapy into day two.  Dressing was changed on day two and the incision was healing well. She had some intermittent nausea and not feeling well that day.  By day three, the patient had progressed with therapy and meeting their goals.  Incision was healing well.  Patient was seen in rounds and was ready to go home.   Discharge Medications: Prior to Admission medications   Medication Sig Start Date End Date Taking? Authorizing Provider  acetaminophen (TYLENOL) 500 MG tablet Take 500 mg by mouth every 6 (six) hours as needed for pain.   Yes Historical Provider, MD  fenofibrate (TRICOR) 145 MG tablet Take 145 mg by mouth every morning.    Yes Historical Provider, MD  levothyroxine (SYNTHROID, LEVOTHROID) 125 MCG tablet Take 125 mcg by mouth every morning.    Yes Historical Provider, MD  losartan (COZAAR) 25 MG tablet Take 25 mg by mouth 2 (two) times daily.    Yes Historical Provider, MD  metFORMIN (GLUMETZA) 500 MG (MOD) 24 hr tablet Take 500 mg by mouth 3 (three) times daily.    Yes Historical Provider, MD  metoprolol (TOPROL-XL) 50 MG 24 hr tablet Take 50 mg by mouth every morning.    Yes Historical Provider, MD  omeprazole (PRILOSEC) 20 MG capsule Take 20 mg by mouth daily.     Yes Historical Provider, MD  iron  polysaccharides (NIFEREX) 150 MG capsule Take 1 capsule (150 mg total) by mouth daily. 03/19/12   Alexzandrew Julien Girt, PA-C  methocarbamol (ROBAXIN) 500 MG tablet Take 1 tablet (500 mg total) by mouth every 6 (six) hours as needed. 03/19/12   Alexzandrew Perkins, PA-C  oxyCODONE (OXY IR/ROXICODONE) 5 MG immediate release tablet Take 1-2 tablets (5-10 mg total) by mouth every 3 (three) hours as needed. 03/19/12   Alexzandrew Julien Girt, PA-C  rivaroxaban (XARELTO) 10 MG TABS tablet Take 1 tablet (10 mg total) by mouth daily with breakfast. Take Xarelto for two and a half more weeks, then discontinue Xarelto. 03/19/12   Alexzandrew Perkins, PA-C  traMADol (ULTRAM) 50 MG tablet Take 1-2 tablets (  50-100 mg total) by mouth every 6 (six) hours as needed (mild pain). 03/19/12   Alexzandrew Julien Girt, PA-C    Diet: Cardiac diet and Diabetic diet Activity:WBAT Follow-up:in 2 weeks Disposition - Home Discharged Condition: good   Discharge Orders   Future Orders Complete By Expires     Call MD / Call 911  As directed     Comments:      If you experience chest pain or shortness of breath, CALL 911 and be transported to the hospital emergency room.  If you develope a fever above 101 F, pus (white drainage) or increased drainage or redness at the wound, or calf pain, call your surgeon's office.    Change dressing  As directed     Comments:      Change dressing daily with sterile 4 x 4 inch gauze dressing and apply TED hose. Do not submerge the incision under water.    Constipation Prevention  As directed     Comments:      Drink plenty of fluids.  Prune juice may be helpful.  You may use a stool softener, such as Colace (over the counter) 100 mg twice a day.  Use MiraLax (over the counter) for constipation as needed.    Diet - low sodium heart healthy  As directed     Diet Carb Modified  As directed     Discharge instructions  As directed     Comments:      Pick up stool softner and laxative for home. Do not  submerge incision under water. May shower. Continue to use ice for pain and swelling from surgery.  Take Xarelto for two and a half more weeks, then discontinue Xarelto.    Do not put a pillow under the knee. Place it under the heel.  As directed     Do not sit on low chairs, stoools or toilet seats, as it may be difficult to get up from low surfaces  As directed     Driving restrictions  As directed     Comments:      No driving until released by the physician.    Increase activity slowly as tolerated  As directed     Lifting restrictions  As directed     Comments:      No lifting until released by the physician.    Patient may shower  As directed     Comments:      You may shower without a dressing once there is no drainage.  Do not wash over the wound.  If drainage remains, do not shower until drainage stops.    TED hose  As directed     Comments:      Use stockings (TED hose) for 3 weeks on both leg(s).  You may remove them at night for sleeping.    Weight bearing as tolerated  As directed         Medication List    TAKE these medications       acetaminophen 500 MG tablet  Commonly known as:  TYLENOL  Take 500 mg by mouth every 6 (six) hours as needed for pain.     fenofibrate 145 MG tablet  Commonly known as:  TRICOR  Take 145 mg by mouth every morning.     iron polysaccharides 150 MG capsule  Commonly known as:  NIFEREX  Take 1 capsule (150 mg total) by mouth daily.     levothyroxine 125 MCG tablet  Commonly known as:  SYNTHROID, LEVOTHROID  Take 125 mcg by mouth every morning.     losartan 25 MG tablet  Commonly known as:  COZAAR  Take 25 mg by mouth 2 (two) times daily.     metFORMIN 500 MG (MOD) 24 hr tablet  Commonly known as:  GLUMETZA  Take 500 mg by mouth 3 (three) times daily.     methocarbamol 500 MG tablet  Commonly known as:  ROBAXIN  Take 1 tablet (500 mg total) by mouth every 6 (six) hours as needed.     metoprolol succinate 50 MG 24 hr  tablet  Commonly known as:  TOPROL-XL  Take 50 mg by mouth every morning.     omeprazole 20 MG capsule  Commonly known as:  PRILOSEC  Take 20 mg by mouth daily.     oxyCODONE 5 MG immediate release tablet  Commonly known as:  Oxy IR/ROXICODONE  Take 1-2 tablets (5-10 mg total) by mouth every 3 (three) hours as needed.     rivaroxaban 10 MG Tabs tablet  Commonly known as:  XARELTO  Take 1 tablet (10 mg total) by mouth daily with breakfast. Take Xarelto for two and a half more weeks, then discontinue Xarelto.     traMADol 50 MG tablet  Commonly known as:  ULTRAM  Take 1-2 tablets (50-100 mg total) by mouth every 6 (six) hours as needed (mild pain).           Follow-up Information   Follow up with Loanne Drilling, MD. Schedule an appointment as soon as possible for a visit in 2 weeks.   Contact information:   [address], [address] [place] [address] [phone]       Signed: Patrica Duel 03/19/2012, 8:13 AM

## 2012-03-19 NOTE — Progress Notes (Signed)
Physical Therapy Treatment Patient Details Name: Diana Wood MRN: [id number] DOB: [birth year] Today's Date: 03/19/2012 Time: 0820-0901 PT Time Calculation (min): 41 min  PT Assessment / Plan / Recommendation Comments on Treatment Session  Reviewed don/doff KI with pt.  Pt requests review of steps when spouse arrives    Follow Up Recommendations  Home health PT     Does the patient have the potential to tolerate intense rehabilitation     Barriers to Discharge        Equipment Recommendations  None recommended by PT    Recommendations for Other Services OT consult  Frequency 7X/week   Plan Discharge plan remains appropriate    Precautions / Restrictions Precautions Precautions: Knee Required Braces or Orthoses: Knee Immobilizer - Left Knee Immobilizer - Left: Discontinue once straight leg raise with < 10 degree lag Restrictions Weight Bearing Restrictions: No Other Position/Activity Restrictions: WBAT   Pertinent Vitals/Pain     Mobility  Bed Mobility Bed Mobility: Supine to Sit Supine to Sit: 4: Min assist;With rails Details for Bed Mobility Assistance: cues for sequence and use of R LE to self assist Transfers Transfers: Sit to Stand;Stand to Sit Sit to Stand: 4: Min assist Stand to Sit: 4: Min guard Details for Transfer Assistance: cues for LE management and use of UEs to self assist Ambulation/Gait Ambulation/Gait Assistance: 4: Min guard;5: Supervision Ambulation Distance (Feet): 123 Feet Assistive device: Rolling walker Ambulation/Gait Assistance Details: cues for posture, sequence and stride length Gait Pattern: Step-to pattern;Decreased step length - left;Decreased step length - right;Decreased stance time - left Gait velocity: sloww Stairs: Yes Stairs Assistance: 4: Min assist Stairs Assistance Details (indicate cue type and reason): cues for sequence and foot/RW placement -  Stair Management Technique: No rails;Backwards;Forwards;Step to pattern;With  walker Number of Stairs: 1 (once fwd and once bkwd)    Exercises Total Joint Exercises Ankle Circles/Pumps: AROM;Supine;Both;20 reps Quad Sets: AROM;Both;Supine;20 reps Heel Slides: AAROM;Supine;Left;20 reps Straight Leg Raises: AAROM;Left;Supine;20 reps   PT Diagnosis:    PT Problem List:   PT Treatment Interventions:     PT Goals Acute Rehab PT Goals PT Goal Formulation: With patient Time For Goal Achievement: 03/21/12 Potential to Achieve Goals: Good Pt will go Supine/Side to Sit: with supervision PT Goal: Supine/Side to Sit - Progress: Progressing toward goal Pt will go Sit to Supine/Side: with supervision PT Goal: Sit to Supine/Side - Progress: Progressing toward goal Pt will go Sit to Stand: with supervision PT Goal: Sit to Stand - Progress: Progressing toward goal Pt will go Stand to Sit: with supervision PT Goal: Stand to Sit - Progress: Progressing toward goal Pt will Ambulate: 51 - 150 feet;with supervision;with rolling walker PT Goal: Ambulate - Progress: Progressing toward goal Pt will Go Up / Down Stairs: 1-2 stairs;with min assist;with least restrictive assistive device PT Goal: Up/Down Stairs - Progress: Progressing toward goal  Visit Information  Last PT Received On: 08/26/12 Assistance Needed: +1    Subjective Data  Subjective: I'm going home today Patient Stated Goal: Resume previous lifestyle with decreased pain   Cognition  Cognition Overall Cognitive Status: Appears within functional limits for tasks assessed/performed Arousal/Alertness: Awake/alert Orientation Level: Appears intact for tasks assessed Behavior During Session: [facility] for tasks performed    Balance     End of Session PT - End of Session Equipment Utilized During Treatment: Left knee immobilizer Activity Tolerance: Patient tolerated treatment well Patient left: in chair;with call bell/phone within reach Nurse Communication: Mobility status CPM Left Knee CPM  Left Knee: Off   GP      BRADSHAW,HUNTER 03/19/2012, 10:33 AM

## 2012-03-19 NOTE — Progress Notes (Signed)
Stopped in to see patient today. Offered support. She reported that she will be going home today and that she is ready. Expressed hope for her continuing recovery. Had short prayer with patient.

## 2012-03-19 NOTE — Progress Notes (Signed)
   Subjective: 3 Days Post-Op Procedure(s) (LRB): TOTAL KNEE ARTHROPLASTY (Left) Patient reports pain as mild.   Patient seen in rounds with Dr. Lequita Halt. Patient is well, and has had no acute complaints or problems Patient is ready to go home later today.  Objective: Vital signs in last 24 hours: Temp:  [99 F (37.2 C)-99.3 F (37.4 C)] 99 F (37.2 C) (03/13 0644) Pulse Rate:  [70-78] 78 (03/13 0644) Resp:  [18-20] 20 (03/13 0644) BP: (131-145)/(69-80) 131/80 mmHg (03/13 0644) SpO2:  [93 %-94 %] 93 % (03/13 0644)  Intake/Output from previous day:  Intake/Output Summary (Last 24 hours) at 03/19/12 0805 Last data filed at 03/19/12 0644  Gross per 24 hour  Intake   1260 ml  Output      0 ml  Net   1260 ml    Intake/Output this shift:    Labs:  Recent Labs  03/17/12 0455 03/18/12 0447 03/19/12 0504  HGB 9.7* 9.9* 9.5*    Recent Labs  03/18/12 0447 03/19/12 0504  WBC 13.4* 11.6*  RBC 3.73* 3.49*  HCT 30.5* 28.8*  PLT 322 339    Recent Labs  03/18/12 0447 03/19/12 0504  NA 134* 135  K 3.5 3.9  CL 98 99  CO2 25 27  BUN 13 18  CREATININE 0.67 0.76  GLUCOSE 178* 151*  CALCIUM 9.2 9.2   No results found for this basename: LABPT, INR,  in the last 72 hours  EXAM: General - Patient is Alert, Appropriate and Oriented Extremity - Neurovascular intact Sensation intact distally Dorsiflexion/Plantar flexion intact No cellulitis present Incision - clean, dry, no drainage, healing Motor Function - intact, moving foot and toes well on exam.   Assessment/Plan: 3 Days Post-Op Procedure(s) (LRB): TOTAL KNEE ARTHROPLASTY (Left) Procedure(s) (LRB): TOTAL KNEE ARTHROPLASTY (Left) Past Medical History  Diagnosis Date  . Hyperlipidemia   . Hypertension   . CAD (coronary artery disease)   . Chest pain   . Anemia   . Hypothyroid   . Hiatal hernia   . Pleural effusion   . Diabetes mellitus   . Hypercholesterolemia   . GERD (gastroesophageal reflux  disease)   . Obesity   . Uterine cancer   . Deep vein thrombosis    Principal Problem:   OA (osteoarthritis) of knee Active Problems:   Postop Hyponatremia   Postoperative anemia due to acute blood loss  Estimated body mass index is 29.51 kg/(m^2) as calculated from the following:   Height as of this encounter: 5\' 4"  (1.626 m).   Weight as of this encounter: 78.019 kg (172 lb). Up with therapy Discharge home with home health Diet - Cardiac diet and Diabetic diet Follow up - in 2 weeks Activity - WBAT Disposition - Home Condition Upon Discharge - Good D/C Meds - See DC Summary DVT Prophylaxis - Xarelto  PERKINS, ALEXZANDREW 03/19/2012, 8:05 AM

## 2012-03-21 DIAGNOSIS — IMO0001 Reserved for inherently not codable concepts without codable children: Secondary | ICD-10-CM | POA: Diagnosis not present

## 2012-03-21 DIAGNOSIS — M1710 Unilateral primary osteoarthritis, unspecified knee: Secondary | ICD-10-CM | POA: Diagnosis not present

## 2012-03-21 DIAGNOSIS — Z471 Aftercare following joint replacement surgery: Secondary | ICD-10-CM | POA: Diagnosis not present

## 2012-03-21 DIAGNOSIS — D62 Acute posthemorrhagic anemia: Secondary | ICD-10-CM | POA: Diagnosis not present

## 2012-03-23 DIAGNOSIS — Z96659 Presence of unspecified artificial knee joint: Secondary | ICD-10-CM | POA: Diagnosis not present

## 2012-03-24 DIAGNOSIS — D62 Acute posthemorrhagic anemia: Secondary | ICD-10-CM | POA: Diagnosis not present

## 2012-03-27 DIAGNOSIS — IMO0001 Reserved for inherently not codable concepts without codable children: Secondary | ICD-10-CM | POA: Diagnosis not present

## 2012-03-27 DIAGNOSIS — D62 Acute posthemorrhagic anemia: Secondary | ICD-10-CM | POA: Diagnosis not present

## 2012-03-27 DIAGNOSIS — Z96659 Presence of unspecified artificial knee joint: Secondary | ICD-10-CM | POA: Diagnosis not present

## 2012-03-27 DIAGNOSIS — Z471 Aftercare following joint replacement surgery: Secondary | ICD-10-CM | POA: Diagnosis not present

## 2012-03-27 DIAGNOSIS — Z7901 Long term (current) use of anticoagulants: Secondary | ICD-10-CM | POA: Diagnosis not present

## 2012-03-30 DIAGNOSIS — Z7901 Long term (current) use of anticoagulants: Secondary | ICD-10-CM | POA: Diagnosis not present

## 2012-03-30 DIAGNOSIS — IMO0001 Reserved for inherently not codable concepts without codable children: Secondary | ICD-10-CM | POA: Diagnosis not present

## 2012-03-30 DIAGNOSIS — Z96659 Presence of unspecified artificial knee joint: Secondary | ICD-10-CM | POA: Diagnosis not present

## 2012-03-30 DIAGNOSIS — Z471 Aftercare following joint replacement surgery: Secondary | ICD-10-CM | POA: Diagnosis not present

## 2012-03-30 DIAGNOSIS — D62 Acute posthemorrhagic anemia: Secondary | ICD-10-CM | POA: Diagnosis not present

## 2012-03-31 DIAGNOSIS — Z471 Aftercare following joint replacement surgery: Secondary | ICD-10-CM | POA: Diagnosis not present

## 2012-03-31 DIAGNOSIS — Z96659 Presence of unspecified artificial knee joint: Secondary | ICD-10-CM | POA: Diagnosis not present

## 2012-03-31 DIAGNOSIS — IMO0001 Reserved for inherently not codable concepts without codable children: Secondary | ICD-10-CM | POA: Diagnosis not present

## 2012-03-31 DIAGNOSIS — D62 Acute posthemorrhagic anemia: Secondary | ICD-10-CM | POA: Diagnosis not present

## 2012-03-31 DIAGNOSIS — Z7901 Long term (current) use of anticoagulants: Secondary | ICD-10-CM | POA: Diagnosis not present

## 2012-04-02 DIAGNOSIS — D62 Acute posthemorrhagic anemia: Secondary | ICD-10-CM | POA: Diagnosis not present

## 2012-04-02 DIAGNOSIS — Z96659 Presence of unspecified artificial knee joint: Secondary | ICD-10-CM | POA: Diagnosis not present

## 2012-04-02 DIAGNOSIS — Z7901 Long term (current) use of anticoagulants: Secondary | ICD-10-CM | POA: Diagnosis not present

## 2012-04-02 DIAGNOSIS — Z471 Aftercare following joint replacement surgery: Secondary | ICD-10-CM | POA: Diagnosis not present

## 2012-04-02 DIAGNOSIS — IMO0001 Reserved for inherently not codable concepts without codable children: Secondary | ICD-10-CM | POA: Diagnosis not present

## 2012-04-06 DIAGNOSIS — D62 Acute posthemorrhagic anemia: Secondary | ICD-10-CM | POA: Diagnosis not present

## 2012-04-06 DIAGNOSIS — IMO0001 Reserved for inherently not codable concepts without codable children: Secondary | ICD-10-CM | POA: Diagnosis not present

## 2012-04-06 DIAGNOSIS — Z96659 Presence of unspecified artificial knee joint: Secondary | ICD-10-CM | POA: Diagnosis not present

## 2012-04-06 DIAGNOSIS — Z471 Aftercare following joint replacement surgery: Secondary | ICD-10-CM | POA: Diagnosis not present

## 2012-04-06 DIAGNOSIS — Z7901 Long term (current) use of anticoagulants: Secondary | ICD-10-CM | POA: Diagnosis not present

## 2012-04-07 DIAGNOSIS — Z471 Aftercare following joint replacement surgery: Secondary | ICD-10-CM | POA: Diagnosis not present

## 2012-04-07 DIAGNOSIS — Z96659 Presence of unspecified artificial knee joint: Secondary | ICD-10-CM | POA: Diagnosis not present

## 2012-04-07 DIAGNOSIS — IMO0001 Reserved for inherently not codable concepts without codable children: Secondary | ICD-10-CM | POA: Diagnosis not present

## 2012-04-07 DIAGNOSIS — Z7901 Long term (current) use of anticoagulants: Secondary | ICD-10-CM | POA: Diagnosis not present

## 2012-04-07 DIAGNOSIS — D62 Acute posthemorrhagic anemia: Secondary | ICD-10-CM | POA: Diagnosis not present

## 2012-04-08 DIAGNOSIS — M1710 Unilateral primary osteoarthritis, unspecified knee: Secondary | ICD-10-CM | POA: Diagnosis not present

## 2012-04-14 DIAGNOSIS — M1710 Unilateral primary osteoarthritis, unspecified knee: Secondary | ICD-10-CM | POA: Diagnosis not present

## 2012-04-16 DIAGNOSIS — M1710 Unilateral primary osteoarthritis, unspecified knee: Secondary | ICD-10-CM | POA: Diagnosis not present

## 2012-04-21 DIAGNOSIS — M1710 Unilateral primary osteoarthritis, unspecified knee: Secondary | ICD-10-CM | POA: Diagnosis not present

## 2012-04-22 DIAGNOSIS — Z96659 Presence of unspecified artificial knee joint: Secondary | ICD-10-CM | POA: Diagnosis not present

## 2012-04-23 DIAGNOSIS — M1710 Unilateral primary osteoarthritis, unspecified knee: Secondary | ICD-10-CM | POA: Diagnosis not present

## 2012-04-28 DIAGNOSIS — M1710 Unilateral primary osteoarthritis, unspecified knee: Secondary | ICD-10-CM | POA: Diagnosis not present

## 2012-04-30 DIAGNOSIS — M1710 Unilateral primary osteoarthritis, unspecified knee: Secondary | ICD-10-CM | POA: Diagnosis not present

## 2012-05-05 DIAGNOSIS — M1710 Unilateral primary osteoarthritis, unspecified knee: Secondary | ICD-10-CM | POA: Diagnosis not present

## 2012-05-07 DIAGNOSIS — M1710 Unilateral primary osteoarthritis, unspecified knee: Secondary | ICD-10-CM | POA: Diagnosis not present

## 2012-05-12 DIAGNOSIS — M1710 Unilateral primary osteoarthritis, unspecified knee: Secondary | ICD-10-CM | POA: Diagnosis not present

## 2012-05-14 DIAGNOSIS — M1710 Unilateral primary osteoarthritis, unspecified knee: Secondary | ICD-10-CM | POA: Diagnosis not present

## 2012-05-19 DIAGNOSIS — M1710 Unilateral primary osteoarthritis, unspecified knee: Secondary | ICD-10-CM | POA: Diagnosis not present

## 2012-05-21 DIAGNOSIS — M1710 Unilateral primary osteoarthritis, unspecified knee: Secondary | ICD-10-CM | POA: Diagnosis not present

## 2012-05-26 DIAGNOSIS — M25569 Pain in unspecified knee: Secondary | ICD-10-CM | POA: Diagnosis not present

## 2012-05-28 DIAGNOSIS — M25569 Pain in unspecified knee: Secondary | ICD-10-CM | POA: Diagnosis not present

## 2012-06-08 DIAGNOSIS — H04129 Dry eye syndrome of unspecified lacrimal gland: Secondary | ICD-10-CM | POA: Diagnosis not present

## 2012-06-08 DIAGNOSIS — E119 Type 2 diabetes mellitus without complications: Secondary | ICD-10-CM | POA: Diagnosis not present

## 2012-06-08 DIAGNOSIS — Z947 Corneal transplant status: Secondary | ICD-10-CM | POA: Diagnosis not present

## 2012-06-08 DIAGNOSIS — D3130 Benign neoplasm of unspecified choroid: Secondary | ICD-10-CM | POA: Diagnosis not present

## 2012-06-08 DIAGNOSIS — H5210 Myopia, unspecified eye: Secondary | ICD-10-CM | POA: Diagnosis not present

## 2012-07-02 DIAGNOSIS — E669 Obesity, unspecified: Secondary | ICD-10-CM | POA: Diagnosis not present

## 2012-07-02 DIAGNOSIS — R748 Abnormal levels of other serum enzymes: Secondary | ICD-10-CM | POA: Diagnosis not present

## 2012-07-02 DIAGNOSIS — I2510 Atherosclerotic heart disease of native coronary artery without angina pectoris: Secondary | ICD-10-CM | POA: Diagnosis not present

## 2012-07-02 DIAGNOSIS — E039 Hypothyroidism, unspecified: Secondary | ICD-10-CM | POA: Diagnosis not present

## 2012-07-02 DIAGNOSIS — Z1331 Encounter for screening for depression: Secondary | ICD-10-CM | POA: Diagnosis not present

## 2012-07-02 DIAGNOSIS — E1169 Type 2 diabetes mellitus with other specified complication: Secondary | ICD-10-CM | POA: Diagnosis not present

## 2012-07-02 DIAGNOSIS — E785 Hyperlipidemia, unspecified: Secondary | ICD-10-CM | POA: Diagnosis not present

## 2012-07-02 DIAGNOSIS — I10 Essential (primary) hypertension: Secondary | ICD-10-CM | POA: Diagnosis not present

## 2012-07-07 ENCOUNTER — Ambulatory Visit: Payer: Medicare Other | Admitting: Cardiovascular Disease

## 2012-08-03 DIAGNOSIS — K625 Hemorrhage of anus and rectum: Secondary | ICD-10-CM | POA: Diagnosis not present

## 2012-08-14 DIAGNOSIS — Z96659 Presence of unspecified artificial knee joint: Secondary | ICD-10-CM | POA: Diagnosis not present

## 2012-08-21 ENCOUNTER — Ambulatory Visit: Payer: Medicare Other | Admitting: Cardiovascular Disease

## 2012-09-14 ENCOUNTER — Encounter: Payer: Self-pay | Admitting: Cardiovascular Disease

## 2012-09-14 ENCOUNTER — Ambulatory Visit (INDEPENDENT_AMBULATORY_CARE_PROVIDER_SITE_OTHER): Payer: Medicare Other | Admitting: Cardiovascular Disease

## 2012-09-14 VITALS — BP 150/72 | HR 72 | Wt 169.0 lb

## 2012-09-14 DIAGNOSIS — I10 Essential (primary) hypertension: Secondary | ICD-10-CM

## 2012-09-14 DIAGNOSIS — E785 Hyperlipidemia, unspecified: Secondary | ICD-10-CM | POA: Diagnosis not present

## 2012-09-14 DIAGNOSIS — I2510 Atherosclerotic heart disease of native coronary artery without angina pectoris: Secondary | ICD-10-CM | POA: Diagnosis not present

## 2012-09-14 DIAGNOSIS — E119 Type 2 diabetes mellitus without complications: Secondary | ICD-10-CM | POA: Diagnosis not present

## 2012-09-14 DIAGNOSIS — K625 Hemorrhage of anus and rectum: Secondary | ICD-10-CM | POA: Diagnosis not present

## 2012-09-14 NOTE — Assessment & Plan Note (Signed)
Well controlled.  Continue current medications and low sodium Dash type diet.    

## 2012-09-14 NOTE — Progress Notes (Signed)
Patient ID: Diana Wood, female   DOB: 1932/06/15, 79 y.o.   MRN: [id number] Diana Wood is seen today for F/U of her hypertension, hyperlipidemia and CAD. 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 She has normal LV fucnton. she had a normal Myoview on May 18, 2007. There was no ischemia with an ejection fraction of 75%. She continues to have occasional atypical twinges of sharp pain in the left side of her chest. She had these previously and had a nonischemic Myoview. I think the neuropathic or from her sternotomy and clearly does not sound like her angina. She continues to have dry eyes and may need a corneal transplant on the left eye. Her eye doctor is in [place]. She had right TKR with Dr Rennis Chris since I last saw her with no complications  Continues to dress with lots of jewelry.  Some anemia and on iron Some fatigue  Dr Timothy Lasso checking chol, TSH and A1c  She reports they are good  ROS: Denies fever, malais, weight loss, blurry vision, decreased visual acuity, cough, sputum, SOB, hemoptysis, pleuritic pain, palpitaitons, heartburn, abdominal pain, melena, lower extremity edema, claudication, or rash.  All other systems reviewed and negative  General: Affect appropriate Healthy:  appears stated age HEENT: normal Neck supple with no adenopathy JVP normal no bruits no thyromegaly Lungs clear with no wheezing and good diaphragmatic motion Heart:  S1/S2 no murmur, no rub, gallop or click PMI normal Abdomen: benighn, BS positve, no tenderness, no AAA no bruit.  No HSM or HJR Distal pulses intact with no bruits No edema Neuro non-focal Skin warm and dry No muscular weakness   Current Outpatient Prescriptions  Medication Sig Dispense Refill  . acetaminophen (TYLENOL) 500 MG tablet Take 500 mg by mouth every 6 (six) hours as needed for pain.      . fenofibrate (TRICOR) 145 MG tablet Take 145 mg by mouth every morning.       [name] levothyroxine (SYNTHROID, LEVOTHROID) 125 MCG tablet Take 125 mcg by mouth every  morning.       [name] losartan (COZAAR) 25 MG tablet Take 25 mg by mouth 2 (two) times daily.       . metFORMIN (GLUMETZA) 500 MG (MOD) 24 hr tablet Take 500 mg by mouth 3 (three) times daily.       . metoprolol (TOPROL-XL) 50 MG 24 hr tablet Take 50 mg by mouth every morning.       [name] omeprazole (PRILOSEC) 20 MG capsule Take 20 mg by mouth daily.        [name] oxyCODONE (OXY IR/ROXICODONE) 5 MG immediate release tablet Take 1-2 tablets (5-10 mg total) by mouth every 3 (three) hours as needed.  80 tablet  0   No current facility-administered medications for this visit.    Allergies  Review of patient's allergies indicates no known allergies.  Electrocardiogram:  Assessment and Plan

## 2012-09-14 NOTE — Assessment & Plan Note (Signed)
Cholesterol is at goal.  Continue current dose of statin and diet Rx.  No myalgias or side effects.  F/U  LFT's in 6 months. Lab Results  Component Value Date   [facility]  Value: 83        Total Cholesterol/HDL:CHD Risk Coronary Heart Disease Risk Table                     Men   Women  1/2 Average Risk   3.4   3.3 11/14/2006   Labs with Dr Marlowe [facility]

## 2012-09-14 NOTE — Assessment & Plan Note (Signed)
Stable with no angina and good activity level.  Continue medical Rx  

## 2012-09-14 NOTE — Assessment & Plan Note (Signed)
Discussed low carb diet.  Target hemoglobin A1c is 6.5 or less.  Continue current medications.  

## 2012-09-24 ENCOUNTER — Other Ambulatory Visit (HOSPITAL_COMMUNITY): Payer: Self-pay | Admitting: *Deleted

## 2012-09-25 ENCOUNTER — Ambulatory Visit (HOSPITAL_COMMUNITY)
Admission: RE | Admit: 2012-09-25 | Discharge: 2012-09-25 | Disposition: A | Discharge status: Home / Self Care | Payer: Medicare Other | Source: Ambulatory Visit | Attending: Internal Medicine | Admitting: Internal Medicine

## 2012-09-25 DIAGNOSIS — D649 Anemia, unspecified: Secondary | ICD-10-CM | POA: Insufficient documentation

## 2012-09-25 MED ORDER — SODIUM CHLORIDE 0.9 % IV SOLN
INTRAVENOUS | Status: DC
  Administered 2012-09-25: 11:00:00 via INTRAVENOUS

## 2012-09-25 MED ORDER — SODIUM CHLORIDE 0.9 % IV SOLN
1020.0000 mg | Freq: Once | INTRAVENOUS | Status: AC
  Administered 2012-09-25: 1020 mg via INTRAVENOUS
  Filled 2012-09-25: qty 34

## 2012-10-09 DIAGNOSIS — Z947 Corneal transplant status: Secondary | ICD-10-CM | POA: Diagnosis not present

## 2012-10-19 DIAGNOSIS — R5381 Other malaise: Secondary | ICD-10-CM | POA: Diagnosis not present

## 2012-10-19 DIAGNOSIS — D509 Iron deficiency anemia, unspecified: Secondary | ICD-10-CM | POA: Diagnosis not present

## 2012-11-02 DIAGNOSIS — E119 Type 2 diabetes mellitus without complications: Secondary | ICD-10-CM | POA: Diagnosis not present

## 2012-11-05 DIAGNOSIS — Z96659 Presence of unspecified artificial knee joint: Secondary | ICD-10-CM | POA: Diagnosis not present

## 2012-11-05 DIAGNOSIS — M25569 Pain in unspecified knee: Secondary | ICD-10-CM | POA: Diagnosis not present

## 2012-11-29 ENCOUNTER — Emergency Department (INDEPENDENT_AMBULATORY_CARE_PROVIDER_SITE_OTHER)
Admission: EM | Admit: 2012-11-29 | Discharge: 2012-11-29 | Disposition: A | Discharge status: Home / Self Care | Payer: Medicare Other | Source: Home / Self Care | Attending: Emergency Medicine | Admitting: Emergency Medicine

## 2012-11-29 ENCOUNTER — Emergency Department (INDEPENDENT_AMBULATORY_CARE_PROVIDER_SITE_OTHER): Payer: Medicare Other

## 2012-11-29 ENCOUNTER — Encounter (HOSPITAL_COMMUNITY): Payer: Self-pay | Admitting: Emergency Medicine

## 2012-11-29 DIAGNOSIS — S6992XA Unspecified injury of left wrist, hand and finger(s), initial encounter: Secondary | ICD-10-CM

## 2012-11-29 DIAGNOSIS — S59909A Unspecified injury of unspecified elbow, initial encounter: Secondary | ICD-10-CM

## 2012-11-29 DIAGNOSIS — M25539 Pain in unspecified wrist: Secondary | ICD-10-CM | POA: Diagnosis not present

## 2012-11-29 DIAGNOSIS — M7989 Other specified soft tissue disorders: Secondary | ICD-10-CM | POA: Diagnosis not present

## 2012-11-29 DIAGNOSIS — S6990XA Unspecified injury of unspecified wrist, hand and finger(s), initial encounter: Secondary | ICD-10-CM

## 2012-11-29 MED ORDER — HYDROCODONE-ACETAMINOPHEN 5-325 MG PO TABS
ORAL_TABLET | ORAL | Status: AC
  Filled 2012-11-29: qty 2

## 2012-11-29 MED ORDER — HYDROCODONE-ACETAMINOPHEN 5-325 MG PO TABS: 1.0000 | ORAL_TABLET | ORAL | Status: DC | PRN

## 2012-11-29 MED ORDER — HYDROCODONE-ACETAMINOPHEN 5-325 MG PO TABS
2.0000 | ORAL_TABLET | Freq: Once | ORAL | Status: AC
  Administered 2012-11-29: 2 via ORAL

## 2012-11-29 MED ORDER — ALBUTEROL SULFATE HFA 108 (90 BASE) MCG/ACT IN AERS
INHALATION_SPRAY | RESPIRATORY_TRACT | Status: AC
  Filled 2012-11-29: qty 6.7

## 2012-11-29 MED ORDER — ALBUTEROL SULFATE HFA 108 (90 BASE) MCG/ACT IN AERS
2.0000 | INHALATION_SPRAY | Freq: Once | RESPIRATORY_TRACT | Status: AC
  Administered 2012-11-29: 2 via RESPIRATORY_TRACT

## 2012-11-29 NOTE — ED Provider Notes (Signed)
Medical screening examination/treatment/procedure(s) were performed by non-physician practitioner and as supervising physician I was immediately available for consultation/collaboration.  Kallista Pae, M.D.  Wynelle Dreier C Shawnelle Spoerl, MD 11/29/12 2054 

## 2012-11-29 NOTE — ED Notes (Signed)
C/o left wrist pain due to falling down stairs and falling upon hand as patient hit the cement ground yesterday evening Wrist is swollen Ice treatment done Tylenol taken for pain

## 2012-11-29 NOTE — ED Provider Notes (Signed)
CSN: [id number]     Arrival date & time 11/29/12  1225 History   First MD Initiated Contact with Patient 11/29/12 1302     Chief Complaint  Patient presents with  . Wrist Pain   (Consider location/radiation/quality/duration/timing/severity/associated sxs/prior Treatment)  Patient is a 79 y.o. female presenting with wrist pain.  Wrist Pain Pertinent negatives include no shortness of breath.  The patient presents today after a fall down her basement steps yesterday evening patient states that she did not fall with her arms outstretched her brother fell with the posterior surface of her wrist and forearm" slapping" against the floor.  Patient has applied ice and last dose of Tylenol for pain was yesterday evening.   Past Medical History  Diagnosis Date  . Hyperlipidemia   . Hypertension   . CAD (coronary artery disease)   . Chest pain   . Anemia   . Hypothyroid   . Hiatal hernia   . Pleural effusion   . Diabetes mellitus   . Hypercholesterolemia   . GERD (gastroesophageal reflux disease)   . Obesity   . Uterine cancer   . Deep vein thrombosis    Past Surgical History  Procedure Laterality Date  . Endoscopic vein harvesting from both thighs      Evelene Croon MD  . Bilateral knee surgeries    . Cataract extraction, bilateral    . Surgery on 2 fingers      r HAND  . Bilateral feet surgery    . Excision cyst debridement fusion distal interphalangeal joint ring finger  with mini acutrak screw 20 mm in length    . Hysterectomy  2000    dx of uterine cancer  . Abdominal hysterectomy    . Eye surgery      corneal transplant -right  . Coronary artery bypass graft      x 4-11'08-Dr. Bartle([facility])  . Joint replacement      right knee replacement-Dr. supple  . Total knee arthroplasty Left 03/16/2012    Procedure: TOTAL KNEE ARTHROPLASTY;  Surgeon: Loanne Drilling, MD;  Location: [facility];  Service: Orthopedics;  Laterality: Left;   Family History  Problem Relation Age of Onset  .  Cancer Mother   . Diabetes Mother   . Blindness Brother     partial from menigitis   History  Substance Use Topics  . Smoking status: Never Smoker   . Smokeless tobacco: Not on file  . Alcohol Use: No   OB History   Grav Para Term Preterm Abortions TAB SAB Ect Mult Living                 Review of Systems  Constitutional: Negative.   HENT: Negative.   Eyes: Negative.   Respiratory: Positive for cough. Negative for chest tightness and shortness of breath.        Patient reports frequent cough over past several months. Denies any ACE inhibitor use, was removed from Cozaar by her primary for "cough".   Cardiovascular: Negative.   Gastrointestinal: Negative.   Endocrine: Negative.   Genitourinary: Negative.   Musculoskeletal: Positive for joint swelling.       The patient reports swelling and discomfort over left posterior forearm and wrist.  Skin: Negative.   Allergic/Immunologic: Negative.   Neurological: Negative.   Hematological: Negative.   Psychiatric/Behavioral: Negative.    See past medical history above.   Allergies  Review of patient's allergies indicates no known allergies.  Home Medications   Current Outpatient  Rx  Name  Route  Sig  Dispense  Refill  . acetaminophen (TYLENOL) 500 MG tablet   Oral   Take 500 mg by mouth every 6 (six) hours as needed for pain.         . fenofibrate (TRICOR) 145 MG tablet   Oral   Take 145 mg by mouth every morning.          [name] HYDROcodone-acetaminophen (NORCO/VICODIN) 5-325 MG per tablet   Oral   Take 1-2 tablets by mouth every 4 (four) hours as needed.   15 tablet   0   . levothyroxine (SYNTHROID, LEVOTHROID) 125 MCG tablet   Oral   Take 125 mcg by mouth every morning.          [name] losartan (COZAAR) 25 MG tablet   Oral   Take 25 mg by mouth 2 (two) times daily.          . metFORMIN (GLUMETZA) 500 MG (MOD) 24 hr tablet   Oral   Take 500 mg by mouth 3 (three) times daily.          . metoprolol  (TOPROL-XL) 50 MG 24 hr tablet   Oral   Take 50 mg by mouth every morning.          [name] omeprazole (PRILOSEC) 20 MG capsule   Oral   Take 20 mg by mouth daily.           [name] oxyCODONE (OXY IR/ROXICODONE) 5 MG immediate release tablet   Oral   Take 1-2 tablets (5-10 mg total) by mouth every 3 (three) hours as needed.   80 tablet   0    BP 180/82  Pulse 81  Temp(Src) 98.4 F (36.9 C) (Oral)  Resp 17  SpO2 95%  Physical Exam  Nursing note and vitals reviewed. Constitutional: She is oriented to person, place, and time. She appears well-developed and well-nourished.  Cardiovascular: Normal rate, regular rhythm, normal heart sounds and intact distal pulses.  Exam reveals no gallop and no friction rub.   No murmur heard. Pulmonary/Chest: Effort normal. No respiratory distress. She has wheezes. She has no rales. She exhibits no tenderness.  [place] has mild expiratory wheeze noted for right upper lobe.    Musculoskeletal: She exhibits edema and tenderness.       Left wrist: She exhibits tenderness, bony tenderness and swelling. She exhibits no deformity.       Arms: Neurological: She is alert and oriented to person, place, and time. Coordination normal.  Skin: Skin is warm and dry.  Psychiatric: She has a normal mood and affect. Her behavior is normal.   The left wrist has no obvious asymmetry or deformity however it edema is present on the posterior surface. There is no open wounds or overlying erythema or warmth. No bony crepitus noted. There is no scaphoid fullness or tenderness to direct palpation. Patient has normal flexion extension ulnar radial deviation however is uncomfortable movement. Motor sensory function intact; radial pulses and distal color, movement, and sensation present.  Cap refill less than 3 seconds  The patient given 2 puffs of albuterol inhaler during course of stay. Patient states no noticeable difference in breathing. Recent air exchange heard throughout  however right expiratory wheezing still noted. Patient appears to be in no acute distress.   ED Course  Procedures (including critical care time) Labs Review Labs Reviewed - No data to display Imaging Review Dg Wrist Complete Left  11/29/2012   CLINICAL DATA:  History  of fall complaining of left wrist pain and swelling.  EXAM: LEFT WRIST - COMPLETE 3+ VIEW  COMPARISON:  No priors.  FINDINGS: Four views the left wrist demonstrate some diffuse soft tissue swelling around the wrist joint. No definite acute displaced fracture, subluxation or dislocation is identified.  IMPRESSION: 1. Diffuse soft tissue swelling around left wrist joint without evidence of underlying acute bony abnormality.   Electronically Signed   By: Trudie Reed M.D.   On: 11/29/2012 13:49     MDM   1. Wrist injury, left, initial encounter    Meds ordered this encounter  Medications  . albuterol (PROVENTIL HFA;VENTOLIN HFA) 108 (90 BASE) MCG/ACT inhaler 2 puff    Sig:   . HYDROcodone-acetaminophen (NORCO/VICODIN) 5-325 MG per tablet 2 tablet    Sig:   . HYDROcodone-acetaminophen (NORCO/VICODIN) 5-325 MG per tablet    Sig: Take 1-2 tablets by mouth every 4 (four) hours as needed.    Dispense:  15 tablet    Refill:  0   Plan of care discussed with Dr. Lorenz Coaster. Patient placed in left wrist splint and to followup with primary care provider or orthopedics if no improvement within the next 7 days. Patient also to followup with primary care provider regarding cough and wheezing. Patient verbalizes understanding of plan of care.   Weber Cooks, NP 11/29/12 1601

## 2012-12-02 DIAGNOSIS — R062 Wheezing: Secondary | ICD-10-CM | POA: Diagnosis not present

## 2012-12-02 DIAGNOSIS — R05 Cough: Secondary | ICD-10-CM | POA: Diagnosis not present

## 2012-12-02 DIAGNOSIS — J189 Pneumonia, unspecified organism: Secondary | ICD-10-CM | POA: Diagnosis not present

## 2012-12-02 DIAGNOSIS — Z6834 Body mass index (BMI) 34.0-34.9, adult: Secondary | ICD-10-CM | POA: Diagnosis not present

## 2012-12-02 DIAGNOSIS — Z23 Encounter for immunization: Secondary | ICD-10-CM | POA: Diagnosis not present

## 2012-12-02 DIAGNOSIS — M25539 Pain in unspecified wrist: Secondary | ICD-10-CM | POA: Diagnosis not present

## 2012-12-07 DIAGNOSIS — M25539 Pain in unspecified wrist: Secondary | ICD-10-CM | POA: Diagnosis not present

## 2012-12-07 DIAGNOSIS — E785 Hyperlipidemia, unspecified: Secondary | ICD-10-CM | POA: Diagnosis not present

## 2012-12-07 DIAGNOSIS — K7689 Other specified diseases of liver: Secondary | ICD-10-CM | POA: Diagnosis not present

## 2012-12-07 DIAGNOSIS — D509 Iron deficiency anemia, unspecified: Secondary | ICD-10-CM | POA: Diagnosis not present

## 2012-12-07 DIAGNOSIS — R062 Wheezing: Secondary | ICD-10-CM | POA: Diagnosis not present

## 2012-12-07 DIAGNOSIS — E039 Hypothyroidism, unspecified: Secondary | ICD-10-CM | POA: Diagnosis not present

## 2012-12-07 DIAGNOSIS — E119 Type 2 diabetes mellitus without complications: Secondary | ICD-10-CM | POA: Diagnosis not present

## 2012-12-07 DIAGNOSIS — S52599A Other fractures of lower end of unspecified radius, initial encounter for closed fracture: Secondary | ICD-10-CM | POA: Diagnosis not present

## 2012-12-07 DIAGNOSIS — I10 Essential (primary) hypertension: Secondary | ICD-10-CM | POA: Diagnosis not present

## 2012-12-14 DIAGNOSIS — S52599A Other fractures of lower end of unspecified radius, initial encounter for closed fracture: Secondary | ICD-10-CM | POA: Diagnosis not present

## 2012-12-21 DIAGNOSIS — S52599A Other fractures of lower end of unspecified radius, initial encounter for closed fracture: Secondary | ICD-10-CM | POA: Diagnosis not present

## 2012-12-22 DIAGNOSIS — M899 Disorder of bone, unspecified: Secondary | ICD-10-CM | POA: Diagnosis not present

## 2013-01-04 DIAGNOSIS — S52599A Other fractures of lower end of unspecified radius, initial encounter for closed fracture: Secondary | ICD-10-CM | POA: Diagnosis not present

## 2013-01-06 DIAGNOSIS — I10 Essential (primary) hypertension: Secondary | ICD-10-CM | POA: Diagnosis not present

## 2013-01-06 DIAGNOSIS — E039 Hypothyroidism, unspecified: Secondary | ICD-10-CM | POA: Diagnosis not present

## 2013-01-18 DIAGNOSIS — S52599A Other fractures of lower end of unspecified radius, initial encounter for closed fracture: Secondary | ICD-10-CM | POA: Diagnosis not present

## 2013-01-25 DIAGNOSIS — M25519 Pain in unspecified shoulder: Secondary | ICD-10-CM | POA: Diagnosis not present

## 2013-01-28 DIAGNOSIS — E039 Hypothyroidism, unspecified: Secondary | ICD-10-CM | POA: Diagnosis not present

## 2013-01-28 DIAGNOSIS — E785 Hyperlipidemia, unspecified: Secondary | ICD-10-CM | POA: Diagnosis not present

## 2013-01-28 DIAGNOSIS — R269 Unspecified abnormalities of gait and mobility: Secondary | ICD-10-CM | POA: Diagnosis not present

## 2013-01-28 DIAGNOSIS — M25511 Pain in right shoulder: Secondary | ICD-10-CM | POA: Insufficient documentation

## 2013-01-28 DIAGNOSIS — M25519 Pain in unspecified shoulder: Secondary | ICD-10-CM | POA: Diagnosis not present

## 2013-01-28 DIAGNOSIS — I2510 Atherosclerotic heart disease of native coronary artery without angina pectoris: Secondary | ICD-10-CM | POA: Diagnosis not present

## 2013-01-28 DIAGNOSIS — I10 Essential (primary) hypertension: Secondary | ICD-10-CM | POA: Diagnosis not present

## 2013-01-28 DIAGNOSIS — E119 Type 2 diabetes mellitus without complications: Secondary | ICD-10-CM | POA: Diagnosis not present

## 2013-01-28 DIAGNOSIS — M25539 Pain in unspecified wrist: Secondary | ICD-10-CM | POA: Diagnosis not present

## 2013-02-10 DIAGNOSIS — M25519 Pain in unspecified shoulder: Secondary | ICD-10-CM | POA: Diagnosis not present

## 2013-02-15 DIAGNOSIS — S52599A Other fractures of lower end of unspecified radius, initial encounter for closed fracture: Secondary | ICD-10-CM | POA: Diagnosis not present

## 2013-02-15 DIAGNOSIS — M19049 Primary osteoarthritis, unspecified hand: Secondary | ICD-10-CM | POA: Diagnosis not present

## 2013-03-10 DIAGNOSIS — M719 Bursopathy, unspecified: Secondary | ICD-10-CM | POA: Diagnosis not present

## 2013-03-10 DIAGNOSIS — M67919 Unspecified disorder of synovium and tendon, unspecified shoulder: Secondary | ICD-10-CM | POA: Diagnosis not present

## 2013-03-10 DIAGNOSIS — S40019A Contusion of unspecified shoulder, initial encounter: Secondary | ICD-10-CM | POA: Diagnosis not present

## 2013-03-15 DIAGNOSIS — S52599A Other fractures of lower end of unspecified radius, initial encounter for closed fracture: Secondary | ICD-10-CM | POA: Diagnosis not present

## 2013-04-07 DIAGNOSIS — S40019A Contusion of unspecified shoulder, initial encounter: Secondary | ICD-10-CM | POA: Diagnosis not present

## 2013-04-19 DIAGNOSIS — S40019A Contusion of unspecified shoulder, initial encounter: Secondary | ICD-10-CM | POA: Diagnosis not present

## 2013-04-21 DIAGNOSIS — S40019A Contusion of unspecified shoulder, initial encounter: Secondary | ICD-10-CM | POA: Diagnosis not present

## 2013-04-26 DIAGNOSIS — S40019A Contusion of unspecified shoulder, initial encounter: Secondary | ICD-10-CM | POA: Diagnosis not present

## 2013-04-27 DIAGNOSIS — S40019A Contusion of unspecified shoulder, initial encounter: Secondary | ICD-10-CM | POA: Diagnosis not present

## 2013-04-29 DIAGNOSIS — R809 Proteinuria, unspecified: Secondary | ICD-10-CM | POA: Diagnosis not present

## 2013-04-29 DIAGNOSIS — I2510 Atherosclerotic heart disease of native coronary artery without angina pectoris: Secondary | ICD-10-CM | POA: Diagnosis not present

## 2013-04-29 DIAGNOSIS — D509 Iron deficiency anemia, unspecified: Secondary | ICD-10-CM | POA: Diagnosis not present

## 2013-04-29 DIAGNOSIS — E119 Type 2 diabetes mellitus without complications: Secondary | ICD-10-CM | POA: Diagnosis not present

## 2013-04-29 DIAGNOSIS — E039 Hypothyroidism, unspecified: Secondary | ICD-10-CM | POA: Diagnosis not present

## 2013-04-29 DIAGNOSIS — I10 Essential (primary) hypertension: Secondary | ICD-10-CM | POA: Diagnosis not present

## 2013-04-29 DIAGNOSIS — S40019A Contusion of unspecified shoulder, initial encounter: Secondary | ICD-10-CM | POA: Diagnosis not present

## 2013-04-29 DIAGNOSIS — R82998 Other abnormal findings in urine: Secondary | ICD-10-CM | POA: Diagnosis not present

## 2013-05-03 DIAGNOSIS — S40019A Contusion of unspecified shoulder, initial encounter: Secondary | ICD-10-CM | POA: Diagnosis not present

## 2013-05-05 DIAGNOSIS — S40019A Contusion of unspecified shoulder, initial encounter: Secondary | ICD-10-CM | POA: Diagnosis not present

## 2013-05-06 DIAGNOSIS — E785 Hyperlipidemia, unspecified: Secondary | ICD-10-CM | POA: Diagnosis not present

## 2013-05-06 DIAGNOSIS — E559 Vitamin D deficiency, unspecified: Secondary | ICD-10-CM | POA: Insufficient documentation

## 2013-05-06 DIAGNOSIS — M25519 Pain in unspecified shoulder: Secondary | ICD-10-CM | POA: Diagnosis not present

## 2013-05-06 DIAGNOSIS — D509 Iron deficiency anemia, unspecified: Secondary | ICD-10-CM | POA: Diagnosis not present

## 2013-05-06 DIAGNOSIS — E119 Type 2 diabetes mellitus without complications: Secondary | ICD-10-CM | POA: Diagnosis not present

## 2013-05-06 DIAGNOSIS — I10 Essential (primary) hypertension: Secondary | ICD-10-CM | POA: Diagnosis not present

## 2013-05-06 DIAGNOSIS — Z0000 Encounter for general adult medical examination without abnormal findings: Secondary | ICD-10-CM | POA: Diagnosis not present

## 2013-05-06 DIAGNOSIS — E039 Hypothyroidism, unspecified: Secondary | ICD-10-CM | POA: Diagnosis not present

## 2013-05-10 DIAGNOSIS — Z1212 Encounter for screening for malignant neoplasm of rectum: Secondary | ICD-10-CM | POA: Diagnosis not present

## 2013-05-12 ENCOUNTER — Ambulatory Visit: Payer: Medicare Other | Attending: Internal Medicine | Admitting: Physical Therapy

## 2013-05-12 DIAGNOSIS — R269 Unspecified abnormalities of gait and mobility: Secondary | ICD-10-CM | POA: Insufficient documentation

## 2013-05-12 DIAGNOSIS — M6281 Muscle weakness (generalized): Secondary | ICD-10-CM | POA: Insufficient documentation

## 2013-05-12 DIAGNOSIS — IMO0001 Reserved for inherently not codable concepts without codable children: Secondary | ICD-10-CM | POA: Diagnosis not present

## 2013-05-14 ENCOUNTER — Ambulatory Visit: Payer: Medicare Other

## 2013-05-14 DIAGNOSIS — IMO0001 Reserved for inherently not codable concepts without codable children: Secondary | ICD-10-CM | POA: Diagnosis not present

## 2013-05-18 ENCOUNTER — Encounter: Payer: Self-pay | Admitting: Cardiovascular Disease

## 2013-05-18 ENCOUNTER — Ambulatory Visit (INDEPENDENT_AMBULATORY_CARE_PROVIDER_SITE_OTHER): Payer: Medicare Other | Admitting: Cardiovascular Disease

## 2013-05-18 VITALS — BP 146/76 | HR 70 | Ht 61.0 in | Wt 171.0 lb

## 2013-05-18 DIAGNOSIS — E7800 Pure hypercholesterolemia, unspecified: Secondary | ICD-10-CM | POA: Diagnosis not present

## 2013-05-18 DIAGNOSIS — E119 Type 2 diabetes mellitus without complications: Secondary | ICD-10-CM

## 2013-05-18 DIAGNOSIS — I2510 Atherosclerotic heart disease of native coronary artery without angina pectoris: Secondary | ICD-10-CM

## 2013-05-18 DIAGNOSIS — E039 Hypothyroidism, unspecified: Secondary | ICD-10-CM

## 2013-05-18 NOTE — Assessment & Plan Note (Signed)
Cholesterol is at goal.  Continue current dose of statin and diet Rx.  No myalgias or side effects.  F/U  LFT's in 6 months. Lab Results  Component Value Date   [facility]  Value: 83        Total Cholesterol/HDL:CHD Risk Coronary Heart Disease Risk Table                     Men   Women  1/2 Average Risk   3.4   3.3 11/14/2006  Labs with Dr Virgina Jock

## 2013-05-18 NOTE — Assessment & Plan Note (Signed)
Well controlled.  Continue current medications and low sodium Dash type diet.    

## 2013-05-18 NOTE — Assessment & Plan Note (Signed)
Stable with no angina and good activity level.  Continue medical Rx  

## 2013-05-18 NOTE — Progress Notes (Signed)
Patient ID: Diana Wood, female   DOB: 1932/06/10, 80 y.o.   MRN: [id number] Diana Wood is seen today for F/U of her hypertension, hyperlipidemia and CAD. 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 She has normal LV fucnton. she had a normal Myoview on May 18, 2007. There was no ischemia with an ejection fraction of 75%. She continues to have occasional atypical twinges of sharp pain in the left side of her chest. She had these previously and had a nonischemic Myoview. I think the neuropathic or from her sternotomy and clearly does not sound like her angina. She continues to have dry eyes  Had right corneal transplant 3 years ago  Her eye doctor is in [place]   She had right TKR with Dr Onnie Graham  with no complications  But still weak in legs despite PT/OT   Continues to dress with lots of jewelry. Some anemia and on iron Some fatigue  Dr Virgina Jock checking chol, TSH and A1c She reports they are good   ROS: Denies fever, malais, weight loss, blurry vision, decreased visual acuity, cough, sputum, SOB, hemoptysis, pleuritic pain, palpitaitons, heartburn, abdominal pain, melena, lower extremity edema, claudication, or rash.  All other systems reviewed and negative  General: Affect appropriate Healthy:  appears stated age 80: normal Neck supple with no adenopathy JVP normal no bruits no thyromegaly Lungs clear with no wheezing and good diaphragmatic motion Heart:  S1/S2 no murmur, no rub, gallop or click PMI normal Abdomen: benighn, BS positve, no tenderness, no AAA no bruit.  No HSM or HJR Distal pulses intact with no bruits No edema Neuro non-focal Skin warm and dry Bilateral quad weakness with RTKR    Current Outpatient Prescriptions  Medication Sig Dispense Refill  . acetaminophen (TYLENOL) 500 MG tablet Take 500 mg by mouth every 6 (six) hours as needed for pain.      . Cholecalciferol (VITAMIN D PO) Take 2,000 Units by mouth daily.      . fenofibrate (TRICOR) 145 MG tablet Take 145 mg by mouth every morning.        [name] levothyroxine (SYNTHROID, LEVOTHROID) 125 MCG tablet Take 125 mcg by mouth every morning.       [name] losartan (COZAAR) 25 MG tablet Take 25 mg by mouth 2 (two) times daily.       . metFORMIN (GLUMETZA) 500 MG (MOD) 24 hr tablet Take 500 mg by mouth 3 (three) times daily.       . metoprolol (TOPROL-XL) 50 MG 24 hr tablet Take 50 mg by mouth every morning.       [name] omeprazole (PRILOSEC) 20 MG capsule Take 20 mg by mouth daily.         No current facility-administered medications for this visit.    Allergies  Review of patient's allergies indicates no known allergies.  Electrocardiogram: 1/18  SR rate 54  PR 215  Otherwise normal ST segments   Today no change SR rate 70 PR 234 nonspecific ST/T wave changes   Assessment and Plan

## 2013-05-18 NOTE — Patient Instructions (Signed)
Your physician recommends that you continue on your current medications as directed. Please refer to the Current Medication list given to you today.  Your physician wants you to follow-up in: 6 months with Dr. Nishan. You will receive a reminder letter in the mail two months in advance. If you don't receive a letter, please call our office to schedule the follow-up appointment.  

## 2013-05-18 NOTE — Assessment & Plan Note (Signed)
TSH normal with Dr Virgina Jock continue replacement

## 2013-05-18 NOTE — Assessment & Plan Note (Signed)
Discussed low carb diet.  Target hemoglobin A1c is 6.5 or less.  Continue current medications.  

## 2013-05-19 ENCOUNTER — Ambulatory Visit: Payer: Medicare Other | Admitting: Physical Therapy

## 2013-05-19 DIAGNOSIS — IMO0001 Reserved for inherently not codable concepts without codable children: Secondary | ICD-10-CM | POA: Diagnosis not present

## 2013-05-21 ENCOUNTER — Ambulatory Visit: Payer: Medicare Other

## 2013-05-21 DIAGNOSIS — IMO0001 Reserved for inherently not codable concepts without codable children: Secondary | ICD-10-CM | POA: Diagnosis not present

## 2013-06-01 ENCOUNTER — Ambulatory Visit: Payer: Medicare Other | Admitting: Physical Therapy

## 2013-06-01 DIAGNOSIS — IMO0001 Reserved for inherently not codable concepts without codable children: Secondary | ICD-10-CM | POA: Diagnosis not present

## 2013-06-03 ENCOUNTER — Ambulatory Visit: Payer: Medicare Other | Admitting: Physical Therapy

## 2013-06-03 DIAGNOSIS — IMO0001 Reserved for inherently not codable concepts without codable children: Secondary | ICD-10-CM | POA: Diagnosis not present

## 2013-06-08 ENCOUNTER — Ambulatory Visit: Payer: Medicare Other | Attending: Internal Medicine | Admitting: Physical Therapy

## 2013-06-08 DIAGNOSIS — R269 Unspecified abnormalities of gait and mobility: Secondary | ICD-10-CM | POA: Insufficient documentation

## 2013-06-08 DIAGNOSIS — M6281 Muscle weakness (generalized): Secondary | ICD-10-CM | POA: Insufficient documentation

## 2013-06-08 DIAGNOSIS — IMO0001 Reserved for inherently not codable concepts without codable children: Secondary | ICD-10-CM | POA: Diagnosis not present

## 2013-06-10 ENCOUNTER — Ambulatory Visit: Payer: Medicare Other

## 2013-06-10 DIAGNOSIS — IMO0001 Reserved for inherently not codable concepts without codable children: Secondary | ICD-10-CM | POA: Diagnosis not present

## 2013-06-15 ENCOUNTER — Ambulatory Visit: Payer: Medicare Other

## 2013-06-15 DIAGNOSIS — IMO0001 Reserved for inherently not codable concepts without codable children: Secondary | ICD-10-CM | POA: Diagnosis not present

## 2013-06-17 ENCOUNTER — Ambulatory Visit: Payer: Medicare Other | Admitting: Physical Therapy

## 2013-06-17 DIAGNOSIS — IMO0001 Reserved for inherently not codable concepts without codable children: Secondary | ICD-10-CM | POA: Diagnosis not present

## 2013-06-22 ENCOUNTER — Ambulatory Visit: Payer: Medicare Other

## 2013-06-22 DIAGNOSIS — IMO0001 Reserved for inherently not codable concepts without codable children: Secondary | ICD-10-CM | POA: Diagnosis not present

## 2013-06-24 ENCOUNTER — Ambulatory Visit: Payer: Medicare Other | Admitting: Physical Therapy

## 2013-06-24 DIAGNOSIS — M19049 Primary osteoarthritis, unspecified hand: Secondary | ICD-10-CM | POA: Diagnosis not present

## 2013-06-24 DIAGNOSIS — IMO0001 Reserved for inherently not codable concepts without codable children: Secondary | ICD-10-CM | POA: Diagnosis not present

## 2013-06-29 ENCOUNTER — Ambulatory Visit: Payer: Medicare Other | Admitting: Physical Therapy

## 2013-07-01 ENCOUNTER — Ambulatory Visit: Payer: Medicare Other | Admitting: Physical Therapy

## 2013-07-05 DIAGNOSIS — H18519 Endothelial corneal dystrophy, unspecified eye: Secondary | ICD-10-CM | POA: Diagnosis not present

## 2013-07-05 DIAGNOSIS — D3130 Benign neoplasm of unspecified choroid: Secondary | ICD-10-CM | POA: Diagnosis not present

## 2013-07-05 DIAGNOSIS — Z947 Corneal transplant status: Secondary | ICD-10-CM | POA: Diagnosis not present

## 2013-07-05 DIAGNOSIS — H04129 Dry eye syndrome of unspecified lacrimal gland: Secondary | ICD-10-CM | POA: Diagnosis not present

## 2013-07-06 ENCOUNTER — Ambulatory Visit: Payer: Medicare Other

## 2013-07-06 DIAGNOSIS — IMO0001 Reserved for inherently not codable concepts without codable children: Secondary | ICD-10-CM | POA: Diagnosis not present

## 2013-07-08 ENCOUNTER — Ambulatory Visit: Payer: Medicare Other | Attending: Internal Medicine

## 2013-07-08 DIAGNOSIS — R269 Unspecified abnormalities of gait and mobility: Secondary | ICD-10-CM | POA: Insufficient documentation

## 2013-07-08 DIAGNOSIS — M6281 Muscle weakness (generalized): Secondary | ICD-10-CM | POA: Diagnosis not present

## 2013-07-08 DIAGNOSIS — IMO0001 Reserved for inherently not codable concepts without codable children: Secondary | ICD-10-CM | POA: Diagnosis not present

## 2013-07-13 ENCOUNTER — Ambulatory Visit: Payer: Medicare Other | Admitting: Physical Therapy

## 2013-07-15 ENCOUNTER — Ambulatory Visit: Payer: Medicare Other | Admitting: Physical Therapy

## 2013-07-20 ENCOUNTER — Ambulatory Visit: Payer: Medicare Other | Admitting: Physical Therapy

## 2013-07-22 ENCOUNTER — Ambulatory Visit: Payer: Medicare Other | Admitting: Physical Therapy

## 2013-08-12 DIAGNOSIS — L723 Sebaceous cyst: Secondary | ICD-10-CM | POA: Diagnosis not present

## 2013-08-12 DIAGNOSIS — Z6833 Body mass index (BMI) 33.0-33.9, adult: Secondary | ICD-10-CM | POA: Diagnosis not present

## 2013-08-13 DIAGNOSIS — L723 Sebaceous cyst: Secondary | ICD-10-CM | POA: Diagnosis not present

## 2013-08-13 DIAGNOSIS — Z6833 Body mass index (BMI) 33.0-33.9, adult: Secondary | ICD-10-CM | POA: Diagnosis not present

## 2013-09-17 DIAGNOSIS — I2510 Atherosclerotic heart disease of native coronary artery without angina pectoris: Secondary | ICD-10-CM | POA: Diagnosis not present

## 2013-09-17 DIAGNOSIS — M949 Disorder of cartilage, unspecified: Secondary | ICD-10-CM | POA: Diagnosis not present

## 2013-09-17 DIAGNOSIS — I10 Essential (primary) hypertension: Secondary | ICD-10-CM | POA: Diagnosis not present

## 2013-09-17 DIAGNOSIS — Z1331 Encounter for screening for depression: Secondary | ICD-10-CM | POA: Diagnosis not present

## 2013-09-17 DIAGNOSIS — E039 Hypothyroidism, unspecified: Secondary | ICD-10-CM | POA: Diagnosis not present

## 2013-09-17 DIAGNOSIS — E785 Hyperlipidemia, unspecified: Secondary | ICD-10-CM | POA: Diagnosis not present

## 2013-09-17 DIAGNOSIS — E119 Type 2 diabetes mellitus without complications: Secondary | ICD-10-CM | POA: Diagnosis not present

## 2013-09-17 DIAGNOSIS — M899 Disorder of bone, unspecified: Secondary | ICD-10-CM | POA: Diagnosis not present

## 2013-09-17 DIAGNOSIS — E669 Obesity, unspecified: Secondary | ICD-10-CM | POA: Diagnosis not present

## 2013-09-17 DIAGNOSIS — D509 Iron deficiency anemia, unspecified: Secondary | ICD-10-CM | POA: Diagnosis not present

## 2013-09-28 DIAGNOSIS — H18519 Endothelial corneal dystrophy, unspecified eye: Secondary | ICD-10-CM | POA: Diagnosis not present

## 2013-11-15 DIAGNOSIS — Z283 Underimmunization status: Secondary | ICD-10-CM | POA: Diagnosis not present

## 2013-11-15 DIAGNOSIS — Z23 Encounter for immunization: Secondary | ICD-10-CM | POA: Diagnosis not present

## 2013-11-23 ENCOUNTER — Ambulatory Visit: Payer: Medicare Other | Admitting: Cardiovascular Disease

## 2013-12-21 NOTE — Progress Notes (Signed)
Patient ID: Diana Wood, female   DOB: January 24, 1932, 80 y.o.   MRN: [id number] Diana Wood is seen today for F/U of her hypertension, hyperlipidemia and CAD. 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 She has normal LV fucnton. she had a normal Myoview on May 18, 2007. There was no ischemia with an ejection fraction of 75%. She continues to have occasional atypical twinges of sharp pain in the left side of her chest. She had these previously and had a nonischemic Myoview. I think the neuropathic or from her sternotomy and clearly does not sound like her angina. She continues to have dry eyes and may need a corneal transplant on the left eye. Her eye doctor is in [place]. She had right TKR with Dr Onnie Graham since I last saw her with no complications  Continues to dress with lots of jewelry. Some anemia and on iron Some fatigue  Dr Virgina Jock checking chol, TSH and A1c She reports they are good  Coughing with cozaar      ROS: Denies fever, malais, weight loss, blurry vision, decreased visual acuity, cough, sputum, SOB, hemoptysis, pleuritic pain, palpitaitons, heartburn, abdominal pain, melena, lower extremity edema, claudication, or rash.  All other systems reviewed and negative  General: Affect appropriate Healthy:  appears stated age 80: normal Neck supple with no adenopathy JVP normal no bruits no thyromegaly Lungs clear with no wheezing and good diaphragmatic motion Heart:  S1/S2 no murmur, no rub, gallop or click PMI normal Abdomen: benighn, BS positve, no tenderness, no AAA no bruit.  No HSM or HJR Distal pulses intact with no bruits No edema Neuro non-focal Skin warm and dry No muscular weakness   Current Outpatient Prescriptions  Medication Sig Dispense Refill  . acetaminophen (TYLENOL) 500 MG tablet Take 500 mg by mouth every 6 (six) hours as needed for pain.    . Cholecalciferol (VITAMIN D PO) Take 2,000 Units by mouth daily.    . fenofibrate (TRICOR) 145 MG tablet Take 145 mg by mouth every  morning.     [name] levothyroxine (SYNTHROID, LEVOTHROID) 125 MCG tablet Take 125 mcg by mouth every morning.     [name] losartan (COZAAR) 25 MG tablet Take 25 mg by mouth 2 (two) times daily.     . metFORMIN (GLUMETZA) 500 MG (MOD) 24 hr tablet Take 500 mg by mouth 3 (three) times daily.     . metoprolol (TOPROL-XL) 50 MG 24 hr tablet Take 50 mg by mouth every morning.     [name] omeprazole (PRILOSEC) 20 MG capsule Take 20 mg by mouth daily.       No current facility-administered medications for this visit.    Allergies  Review of patient's allergies indicates no known allergies.  Electrocardiogram: 5/15  SR rate 70  PR 254  Lateral T wave changes   Assessment and Plan

## 2013-12-22 ENCOUNTER — Encounter: Payer: Self-pay | Admitting: Cardiovascular Disease

## 2013-12-22 ENCOUNTER — Ambulatory Visit (INDEPENDENT_AMBULATORY_CARE_PROVIDER_SITE_OTHER): Payer: Medicare Other | Admitting: Cardiovascular Disease

## 2013-12-22 VITALS — BP 130/78 | HR 77 | Ht 61.0 in | Wt 172.0 lb

## 2013-12-22 DIAGNOSIS — I2510 Atherosclerotic heart disease of native coronary artery without angina pectoris: Secondary | ICD-10-CM | POA: Diagnosis not present

## 2013-12-22 DIAGNOSIS — E119 Type 2 diabetes mellitus without complications: Secondary | ICD-10-CM | POA: Diagnosis not present

## 2013-12-22 DIAGNOSIS — I10 Essential (primary) hypertension: Secondary | ICD-10-CM

## 2013-12-22 NOTE — Assessment & Plan Note (Signed)
Well controlled.  Continue current medications and low sodium Dash type diet.   Stop cozaar due to recurrent cough add amlodipine if needed in future

## 2013-12-22 NOTE — Assessment & Plan Note (Signed)
Stable with no angina and good activity level.  Continue medical Rx  

## 2013-12-22 NOTE — Patient Instructions (Signed)
Your physician wants you to follow-up in:   Center will receive a reminder letter in the mail two months in advance. If you don't receive a letter, please call our office to schedule the follow-up appointment. Your physician has recommended you make the following change in your medication: STOP LOSARTAN

## 2013-12-22 NOTE — Assessment & Plan Note (Signed)
Discussed low carb diet.  Target hemoglobin A1c is 6.5 or less.  Continue current medications.  

## 2014-01-18 DIAGNOSIS — Z1389 Encounter for screening for other disorder: Secondary | ICD-10-CM | POA: Insufficient documentation

## 2014-01-18 DIAGNOSIS — E559 Vitamin D deficiency, unspecified: Secondary | ICD-10-CM | POA: Diagnosis not present

## 2014-01-18 DIAGNOSIS — E119 Type 2 diabetes mellitus without complications: Secondary | ICD-10-CM | POA: Diagnosis not present

## 2014-01-18 DIAGNOSIS — E669 Obesity, unspecified: Secondary | ICD-10-CM | POA: Diagnosis not present

## 2014-01-18 DIAGNOSIS — E785 Hyperlipidemia, unspecified: Secondary | ICD-10-CM | POA: Diagnosis not present

## 2014-01-18 DIAGNOSIS — I2510 Atherosclerotic heart disease of native coronary artery without angina pectoris: Secondary | ICD-10-CM | POA: Diagnosis not present

## 2014-01-18 DIAGNOSIS — E039 Hypothyroidism, unspecified: Secondary | ICD-10-CM | POA: Diagnosis not present

## 2014-01-18 DIAGNOSIS — D509 Iron deficiency anemia, unspecified: Secondary | ICD-10-CM | POA: Diagnosis not present

## 2014-01-18 DIAGNOSIS — Z6833 Body mass index (BMI) 33.0-33.9, adult: Secondary | ICD-10-CM | POA: Diagnosis not present

## 2014-05-05 DIAGNOSIS — Z008 Encounter for other general examination: Secondary | ICD-10-CM | POA: Diagnosis not present

## 2014-05-05 DIAGNOSIS — I2510 Atherosclerotic heart disease of native coronary artery without angina pectoris: Secondary | ICD-10-CM | POA: Diagnosis not present

## 2014-05-05 DIAGNOSIS — E039 Hypothyroidism, unspecified: Secondary | ICD-10-CM | POA: Diagnosis not present

## 2014-05-05 DIAGNOSIS — N390 Urinary tract infection, site not specified: Secondary | ICD-10-CM | POA: Diagnosis not present

## 2014-05-05 DIAGNOSIS — E785 Hyperlipidemia, unspecified: Secondary | ICD-10-CM | POA: Diagnosis not present

## 2014-05-05 DIAGNOSIS — E559 Vitamin D deficiency, unspecified: Secondary | ICD-10-CM | POA: Diagnosis not present

## 2014-05-05 DIAGNOSIS — E119 Type 2 diabetes mellitus without complications: Secondary | ICD-10-CM | POA: Diagnosis not present

## 2014-05-10 DIAGNOSIS — L309 Dermatitis, unspecified: Secondary | ICD-10-CM | POA: Diagnosis not present

## 2014-05-10 DIAGNOSIS — L821 Other seborrheic keratosis: Secondary | ICD-10-CM | POA: Diagnosis not present

## 2014-05-12 DIAGNOSIS — I517 Cardiomegaly: Secondary | ICD-10-CM | POA: Diagnosis not present

## 2014-05-12 DIAGNOSIS — Z6833 Body mass index (BMI) 33.0-33.9, adult: Secondary | ICD-10-CM | POA: Diagnosis not present

## 2014-05-12 DIAGNOSIS — K760 Fatty (change of) liver, not elsewhere classified: Secondary | ICD-10-CM | POA: Diagnosis not present

## 2014-05-12 DIAGNOSIS — Z1212 Encounter for screening for malignant neoplasm of rectum: Secondary | ICD-10-CM | POA: Diagnosis not present

## 2014-05-12 DIAGNOSIS — E669 Obesity, unspecified: Secondary | ICD-10-CM | POA: Diagnosis not present

## 2014-05-12 DIAGNOSIS — R296 Repeated falls: Secondary | ICD-10-CM | POA: Diagnosis not present

## 2014-05-12 DIAGNOSIS — R269 Unspecified abnormalities of gait and mobility: Secondary | ICD-10-CM | POA: Diagnosis not present

## 2014-05-12 DIAGNOSIS — Z0000 Encounter for general adult medical examination without abnormal findings: Secondary | ICD-10-CM | POA: Diagnosis not present

## 2014-05-12 DIAGNOSIS — R413 Other amnesia: Secondary | ICD-10-CM | POA: Diagnosis not present

## 2014-05-12 DIAGNOSIS — M25511 Pain in right shoulder: Secondary | ICD-10-CM | POA: Diagnosis not present

## 2014-05-12 DIAGNOSIS — Z23 Encounter for immunization: Secondary | ICD-10-CM | POA: Diagnosis not present

## 2014-05-12 DIAGNOSIS — I2510 Atherosclerotic heart disease of native coronary artery without angina pectoris: Secondary | ICD-10-CM | POA: Diagnosis not present

## 2014-05-12 DIAGNOSIS — E559 Vitamin D deficiency, unspecified: Secondary | ICD-10-CM | POA: Diagnosis not present

## 2014-06-29 ENCOUNTER — Ambulatory Visit: Payer: Medicare Other | Admitting: Gynecology

## 2014-07-25 ENCOUNTER — Ambulatory Visit: Payer: TRICARE For Life (TFL) | Admitting: Cardiovascular Disease

## 2014-08-02 ENCOUNTER — Encounter: Payer: Self-pay | Admitting: *Deleted

## 2014-08-04 DIAGNOSIS — Z947 Corneal transplant status: Secondary | ICD-10-CM | POA: Diagnosis not present

## 2014-08-04 DIAGNOSIS — H1851 Endothelial corneal dystrophy: Secondary | ICD-10-CM | POA: Diagnosis not present

## 2014-08-04 DIAGNOSIS — H04123 Dry eye syndrome of bilateral lacrimal glands: Secondary | ICD-10-CM | POA: Diagnosis not present

## 2014-08-07 NOTE — Progress Notes (Signed)
Patient ID: Diana Wood, female   DOB: [birth year], 81 y.o.   MRN: [id number] Diana Wood is seen today for F/U of her hypertension, hyperlipidemia and CAD. 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 She has normal LV fucnton. she had a normal Myoview on May 18, 2007. There was no ischemia with an ejection fraction of 75%. She continues to have occasional atypical twinges of sharp pain in the left side of her chest. She had these previously and had a nonischemic Myoview. I think the neuropathic or from her sternotomy and clearly does not sound like her angina. She continues to have dry eyes and may need a corneal transplant on the left eye. Her eye doctor is in [place]. She had right TKR with Dr Diana Wood since I last saw her with no complications  Continues to dress with lots of jewelry. Some anemia and on iron Some fatigue  Dr Diana Wood checking chol, TSH and A1c She reports they are good  Coughing with cozaar Had one isolated episode of resting pressure in chest that lasted less than a minute   ROS: Denies fever, malais, weight loss, blurry vision, decreased visual acuity, cough, sputum, SOB, hemoptysis, pleuritic pain, palpitaitons, heartburn, abdominal pain, melena, lower extremity edema, claudication, or rash.  All other systems reviewed and negative  General: Affect appropriate Healthy:  appears stated age 81: normal Neck supple with no adenopathy JVP normal no bruits no thyromegaly Lungs clear with no wheezing and good diaphragmatic motion Heart:  S1/S2 no murmur, no rub, gallop or click PMI normal Abdomen: benighn, BS positve, no tenderness, no AAA no bruit.  No HSM or HJR Distal pulses intact with no bruits No edema Neuro non-focal Skin warm and dry No muscular weakness   Current Outpatient Prescriptions  Medication Sig Dispense Refill  . acetaminophen (TYLENOL) 500 MG tablet Take 500 mg by mouth every 6 (six) hours as needed for pain.    . Cholecalciferol (VITAMIN D PO) Take 2,000 Units by  mouth daily.    . fenofibrate (TRICOR) 145 MG tablet Take 145 mg by mouth every morning.     [name] levothyroxine (SYNTHROID, LEVOTHROID) 125 MCG tablet Take 125 mcg by mouth every morning.     . metFORMIN (GLUMETZA) 500 MG (MOD) 24 hr tablet Take 500 mg by mouth 3 (three) times daily.     . metoprolol (TOPROL-XL) 50 MG 24 hr tablet Take 50 mg by mouth every morning.     [name] omeprazole (PRILOSEC) 20 MG capsule Take 20 mg by mouth daily.       No current facility-administered medications for this visit.    Allergies  Losartan  Electrocardiogram: 5/15  SR rate 70  PR 254  Lateral T wave changes   08/09/14  SR rate 66 PR 264  Nonspecific ST/T wave changes   Assessment and Plan CAD:  Stable with no angina and good activity level.  Continue medical Rx  Nitro called in given isolated episode of pressure she will call if needs to use it HTN: Well controlled.  Continue current medications and low sodium Dash type diet.   Chol:  Checked by Dr Diana Wood   Thyroid  TSH normal followed by Diana Wood GERD:  Continue prilosec low carb diet   Nitro called in F/U 6 months  Diana Wood

## 2014-08-09 ENCOUNTER — Encounter: Payer: Self-pay | Admitting: Cardiovascular Disease

## 2014-08-09 ENCOUNTER — Ambulatory Visit (INDEPENDENT_AMBULATORY_CARE_PROVIDER_SITE_OTHER): Payer: Medicare Other | Admitting: Cardiovascular Disease

## 2014-08-09 VITALS — BP 140/88 | HR 66 | Ht 60.0 in | Wt 170.2 lb

## 2014-08-09 DIAGNOSIS — I2510 Atherosclerotic heart disease of native coronary artery without angina pectoris: Secondary | ICD-10-CM | POA: Diagnosis not present

## 2014-08-09 MED ORDER — NITROGLYCERIN 0.4 MG SL SUBL: 0.4000 mg | SUBLINGUAL_TABLET | SUBLINGUAL | Status: DC | PRN

## 2014-08-09 NOTE — Patient Instructions (Signed)
Medication Instructions:  NO CHANGES  Labwork: NONE  Testing/Procedures: NONE  Follow-Up: Your physician wants you to follow-up in: 6 MONTHS WITH DR NISHAN You will receive a reminder letter in the mail two months in advance. If you don't receive a letter, please call our office to schedule the follow-up appointment.  Any Other Special Instructions Will Be Listed Below (If Applicable).   

## 2014-09-14 DIAGNOSIS — Z9889 Other specified postprocedural states: Secondary | ICD-10-CM | POA: Diagnosis not present

## 2014-09-14 DIAGNOSIS — H1851 Endothelial corneal dystrophy: Secondary | ICD-10-CM | POA: Diagnosis not present

## 2014-09-14 DIAGNOSIS — H04123 Dry eye syndrome of bilateral lacrimal glands: Secondary | ICD-10-CM | POA: Diagnosis not present

## 2014-09-14 DIAGNOSIS — D3131 Benign neoplasm of right choroid: Secondary | ICD-10-CM | POA: Diagnosis not present

## 2014-09-15 DIAGNOSIS — I517 Cardiomegaly: Secondary | ICD-10-CM | POA: Diagnosis not present

## 2014-09-15 DIAGNOSIS — Z6833 Body mass index (BMI) 33.0-33.9, adult: Secondary | ICD-10-CM | POA: Diagnosis not present

## 2014-09-15 DIAGNOSIS — I10 Essential (primary) hypertension: Secondary | ICD-10-CM | POA: Diagnosis not present

## 2014-09-15 DIAGNOSIS — E119 Type 2 diabetes mellitus without complications: Secondary | ICD-10-CM | POA: Diagnosis not present

## 2014-09-15 DIAGNOSIS — I2510 Atherosclerotic heart disease of native coronary artery without angina pectoris: Secondary | ICD-10-CM | POA: Diagnosis not present

## 2014-09-15 DIAGNOSIS — E669 Obesity, unspecified: Secondary | ICD-10-CM | POA: Diagnosis not present

## 2014-09-15 DIAGNOSIS — R413 Other amnesia: Secondary | ICD-10-CM | POA: Diagnosis not present

## 2014-09-15 DIAGNOSIS — E785 Hyperlipidemia, unspecified: Secondary | ICD-10-CM | POA: Diagnosis not present

## 2014-09-15 DIAGNOSIS — Z947 Corneal transplant status: Secondary | ICD-10-CM | POA: Diagnosis not present

## 2014-09-15 DIAGNOSIS — I119 Hypertensive heart disease without heart failure: Secondary | ICD-10-CM | POA: Insufficient documentation

## 2014-09-15 DIAGNOSIS — E039 Hypothyroidism, unspecified: Secondary | ICD-10-CM | POA: Diagnosis not present

## 2014-09-28 DIAGNOSIS — E119 Type 2 diabetes mellitus without complications: Secondary | ICD-10-CM | POA: Diagnosis not present

## 2014-09-28 DIAGNOSIS — Z01818 Encounter for other preprocedural examination: Secondary | ICD-10-CM | POA: Diagnosis not present

## 2014-09-28 DIAGNOSIS — Z79899 Other long term (current) drug therapy: Secondary | ICD-10-CM | POA: Diagnosis not present

## 2014-09-28 DIAGNOSIS — I10 Essential (primary) hypertension: Secondary | ICD-10-CM | POA: Diagnosis not present

## 2014-09-28 DIAGNOSIS — Z8542 Personal history of malignant neoplasm of other parts of uterus: Secondary | ICD-10-CM | POA: Diagnosis not present

## 2014-09-28 DIAGNOSIS — H18899 Other specified disorders of cornea, unspecified eye: Secondary | ICD-10-CM | POA: Diagnosis not present

## 2014-09-28 DIAGNOSIS — K219 Gastro-esophageal reflux disease without esophagitis: Secondary | ICD-10-CM | POA: Diagnosis not present

## 2014-10-18 DIAGNOSIS — I252 Old myocardial infarction: Secondary | ICD-10-CM | POA: Diagnosis not present

## 2014-10-18 DIAGNOSIS — K219 Gastro-esophageal reflux disease without esophagitis: Secondary | ICD-10-CM | POA: Diagnosis not present

## 2014-10-18 DIAGNOSIS — I10 Essential (primary) hypertension: Secondary | ICD-10-CM | POA: Diagnosis not present

## 2014-10-18 DIAGNOSIS — H1851 Endothelial corneal dystrophy: Secondary | ICD-10-CM | POA: Diagnosis not present

## 2014-10-18 DIAGNOSIS — E039 Hypothyroidism, unspecified: Secondary | ICD-10-CM | POA: Diagnosis not present

## 2014-10-18 DIAGNOSIS — Z8673 Personal history of transient ischemic attack (TIA), and cerebral infarction without residual deficits: Secondary | ICD-10-CM | POA: Diagnosis not present

## 2014-10-18 DIAGNOSIS — E119 Type 2 diabetes mellitus without complications: Secondary | ICD-10-CM | POA: Diagnosis not present

## 2014-10-18 DIAGNOSIS — Z8542 Personal history of malignant neoplasm of other parts of uterus: Secondary | ICD-10-CM | POA: Diagnosis not present

## 2014-10-18 DIAGNOSIS — I2510 Atherosclerotic heart disease of native coronary artery without angina pectoris: Secondary | ICD-10-CM | POA: Diagnosis not present

## 2014-10-18 DIAGNOSIS — Z79899 Other long term (current) drug therapy: Secondary | ICD-10-CM | POA: Diagnosis not present

## 2014-10-24 DIAGNOSIS — Z96652 Presence of left artificial knee joint: Secondary | ICD-10-CM | POA: Diagnosis not present

## 2014-10-24 DIAGNOSIS — Z471 Aftercare following joint replacement surgery: Secondary | ICD-10-CM | POA: Diagnosis not present

## 2014-11-09 DIAGNOSIS — E119 Type 2 diabetes mellitus without complications: Secondary | ICD-10-CM | POA: Diagnosis not present

## 2014-11-09 DIAGNOSIS — D3131 Benign neoplasm of right choroid: Secondary | ICD-10-CM | POA: Diagnosis not present

## 2014-11-09 DIAGNOSIS — H04123 Dry eye syndrome of bilateral lacrimal glands: Secondary | ICD-10-CM | POA: Diagnosis not present

## 2014-11-09 DIAGNOSIS — H1851 Endothelial corneal dystrophy: Secondary | ICD-10-CM | POA: Diagnosis not present

## 2014-11-09 DIAGNOSIS — Z947 Corneal transplant status: Secondary | ICD-10-CM | POA: Diagnosis not present

## 2015-01-24 DIAGNOSIS — Z471 Aftercare following joint replacement surgery: Secondary | ICD-10-CM | POA: Diagnosis not present

## 2015-01-24 DIAGNOSIS — Z96652 Presence of left artificial knee joint: Secondary | ICD-10-CM | POA: Diagnosis not present

## 2015-01-31 DIAGNOSIS — M25511 Pain in right shoulder: Secondary | ICD-10-CM | POA: Diagnosis not present

## 2015-01-31 DIAGNOSIS — E038 Other specified hypothyroidism: Secondary | ICD-10-CM | POA: Diagnosis not present

## 2015-01-31 DIAGNOSIS — I119 Hypertensive heart disease without heart failure: Secondary | ICD-10-CM | POA: Diagnosis not present

## 2015-01-31 DIAGNOSIS — E784 Other hyperlipidemia: Secondary | ICD-10-CM | POA: Diagnosis not present

## 2015-01-31 DIAGNOSIS — I517 Cardiomegaly: Secondary | ICD-10-CM | POA: Diagnosis not present

## 2015-01-31 DIAGNOSIS — I2510 Atherosclerotic heart disease of native coronary artery without angina pectoris: Secondary | ICD-10-CM | POA: Diagnosis not present

## 2015-01-31 DIAGNOSIS — Z23 Encounter for immunization: Secondary | ICD-10-CM | POA: Diagnosis not present

## 2015-01-31 DIAGNOSIS — E668 Other obesity: Secondary | ICD-10-CM | POA: Diagnosis not present

## 2015-01-31 DIAGNOSIS — R413 Other amnesia: Secondary | ICD-10-CM | POA: Diagnosis not present

## 2015-01-31 DIAGNOSIS — E119 Type 2 diabetes mellitus without complications: Secondary | ICD-10-CM | POA: Diagnosis not present

## 2015-01-31 DIAGNOSIS — Z1389 Encounter for screening for other disorder: Secondary | ICD-10-CM | POA: Diagnosis not present

## 2015-01-31 DIAGNOSIS — Z6832 Body mass index (BMI) 32.0-32.9, adult: Secondary | ICD-10-CM | POA: Diagnosis not present

## 2015-02-01 NOTE — Progress Notes (Signed)
Patient ID: Diana Wood, female   DOB: 01-15-1932, 82 y.o.   MRN: [id number]   Diana Wood is seen today for F/U of her hypertension, hyperlipidemia and CAD. 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 She has normal LV fucnton. she had a normal Myoview on May 18, 2007. There was no ischemia with an ejection fraction of 75%. She continues to have occasional atypical twinges of sharp pain in the left side of her chest. She had these previously and had a nonischemic Myoview. I think the neuropathic or from her sternotomy and clearly does not sound like her angina. She continues to have dry eyes and may need a corneal transplant on the left eye. Her eye doctor is in [place]. She had right TKR with Dr Diana Wood since I last saw her with no complications  Continues to dress with lots of jewelry. Some anemia and on iron Some fatigue  Dr Diana Wood checking chol, TSH and A1c She reports they are good  Coughing with cozaar Started on hydralazine by primary but only once/day  Seems to have issues with med compliance per daughters   ROS: Denies fever, malais, weight loss, blurry vision, decreased visual acuity, cough, sputum, SOB, hemoptysis, pleuritic pain, palpitaitons, heartburn, abdominal pain, melena, lower extremity edema, claudication, or rash.  All other systems reviewed and negative  General: Affect appropriate Healthy:  appears stated age 5: normal Neck supple with no adenopathy JVP normal no bruits no thyromegaly Lungs clear with no wheezing and good diaphragmatic motion Heart:  S1/S2 no murmur, no rub, gallop or click PMI normal Abdomen: benighn, BS positve, no tenderness, no AAA no bruit.  No HSM or HJR Distal pulses intact with no bruits No edema Neuro non-focal Skin warm and dry No muscular weakness   Current Outpatient Prescriptions  Medication Sig Dispense Refill  . acetaminophen (TYLENOL) 500 MG tablet Take 500 mg by mouth every 6 (six) hours as needed for pain.    . Cholecalciferol (VITAMIN D  PO) Take 2,000 Units by mouth daily.    . fenofibrate (TRICOR) 145 MG tablet Take 145 mg by mouth every morning.     . hydrALAZINE (APRESOLINE) 10 MG tablet Take 1 tablet (10 mg total) by mouth 2 (two) times daily. 180 tablet 3  . levothyroxine (SYNTHROID, LEVOTHROID) 125 MCG tablet Take 125 mcg by mouth every morning.     . metFORMIN (GLUMETZA) 500 MG (MOD) 24 hr tablet Take 1,000 mg by mouth daily.     . metoprolol (TOPROL-XL) 50 MG 24 hr tablet Take 50 mg by mouth every morning.     . nitroGLYCERIN (NITROSTAT) 0.4 MG SL tablet Place 1 tablet (0.4 mg total) under the tongue every 5 (five) minutes as needed for chest pain. 25 tablet 3  . omeprazole (PRILOSEC) 20 MG capsule Take 20 mg by mouth daily.      . prednisoLONE acetate (PRED FORTE) 1 % ophthalmic suspension Place 1 drop into both eyes daily.     No current facility-administered medications for this visit.    Allergies  Losartan and Tape  Electrocardiogram: 5/15  SR rate 70  PR 254  Lateral T wave changes   08/09/14  SR rate 66 PR 264  Nonspecific ST/T wave changes   Assessment and Plan CAD:  Stable with no angina and good activity level.    HTN: compliance issues Dr Diana Wood started hydralazine change it to bid with Toprol  Chol:  Checked by Dr Diana Wood   Thyroid  TSH normal followed by  Diana Wood GERD:  Continue prilosec low carb diet   F/U 6 months  Diana Wood

## 2015-02-07 ENCOUNTER — Encounter: Payer: Self-pay | Admitting: Cardiovascular Disease

## 2015-02-07 ENCOUNTER — Ambulatory Visit (INDEPENDENT_AMBULATORY_CARE_PROVIDER_SITE_OTHER): Payer: Medicare Other | Admitting: Cardiovascular Disease

## 2015-02-07 VITALS — BP 158/88 | HR 63 | Ht 60.0 in | Wt 165.4 lb

## 2015-02-07 DIAGNOSIS — I10 Essential (primary) hypertension: Secondary | ICD-10-CM | POA: Diagnosis not present

## 2015-02-07 DIAGNOSIS — I2510 Atherosclerotic heart disease of native coronary artery without angina pectoris: Secondary | ICD-10-CM

## 2015-02-07 MED ORDER — HYDRALAZINE HCL 10 MG PO TABS: 10.0000 mg | ORAL_TABLET | Freq: Two times a day (BID) | ORAL | Status: DC

## 2015-02-07 NOTE — Patient Instructions (Addendum)
Medication Instructions:  Your physician has recommended you make the following change in your medication:  1-Increase Hydralazine 10 mg by mouth twice daily  Labwork: NONE  Testing/Procedures: NONE  Follow-Up: Your physician wants you to follow-up in: 6 months with Dr. Johnsie Cancel. You will receive a reminder letter in the mail two months in advance. If you don't receive a letter, please call our office to schedule the follow-up appointment.   If you need a refill on your cardiac medications before your next appointment, please call your pharmacy.

## 2015-02-09 DIAGNOSIS — H1851 Endothelial corneal dystrophy: Secondary | ICD-10-CM | POA: Diagnosis not present

## 2015-02-09 DIAGNOSIS — Z947 Corneal transplant status: Secondary | ICD-10-CM | POA: Diagnosis not present

## 2015-04-05 ENCOUNTER — Telehealth: Payer: Self-pay | Admitting: Cardiovascular Disease

## 2015-04-05 NOTE — Telephone Encounter (Addendum)
Patient's daughter calling about patient's BP being elevated.  BP: 174/97, 191/84, 169/85, 194/86, 167/116  Daughter is not sure patient has been taking her medications, but patient states she is taking her medications. Daughter thinks patient is depressed and forgetting things. Daughter is not sure what to do. Informed patient's daughter that if patient is not being compliant with medications, then increasing her BP medications might not be helpful. Also, suggested calling patient's PCP, but daughter wants to hear from Dr. Johnsie Cancel first.  Will forward to Dr. Johnsie Cancel for advisement.

## 2015-04-05 NOTE — Telephone Encounter (Signed)
Would see if her pill bottles mach up with what she should have left for the month

## 2015-04-05 NOTE — Telephone Encounter (Signed)
New message   Pt daughter calling for rn

## 2015-04-07 NOTE — Telephone Encounter (Signed)
Left message to call back  

## 2015-04-07 NOTE — Telephone Encounter (Signed)
Patient has an appointment with her PCP on Tuesday. Informed patient's daughter of Dr. Kyla Balzarine advise. Patient's daughter stated she has tried that but could not count all the bottles, but the one she did count was correct. Patient's daughter stated she might be doing something else with her pills, she not sure. Encouraged daughter to have PCP evaluate patient on Tuesday, and see what her BP is doing then. Also, encouraged her to have patient bring her BP machine from home to compare reading at PCP office.

## 2015-04-11 DIAGNOSIS — I119 Hypertensive heart disease without heart failure: Secondary | ICD-10-CM | POA: Diagnosis not present

## 2015-04-11 DIAGNOSIS — Z681 Body mass index (BMI) 19 or less, adult: Secondary | ICD-10-CM | POA: Diagnosis not present

## 2015-04-11 DIAGNOSIS — E784 Other hyperlipidemia: Secondary | ICD-10-CM | POA: Diagnosis not present

## 2015-04-11 DIAGNOSIS — I517 Cardiomegaly: Secondary | ICD-10-CM | POA: Diagnosis not present

## 2015-04-11 DIAGNOSIS — R413 Other amnesia: Secondary | ICD-10-CM | POA: Diagnosis not present

## 2015-04-11 DIAGNOSIS — E669 Obesity, unspecified: Secondary | ICD-10-CM | POA: Diagnosis not present

## 2015-04-11 DIAGNOSIS — E038 Other specified hypothyroidism: Secondary | ICD-10-CM | POA: Diagnosis not present

## 2015-05-10 DIAGNOSIS — E784 Other hyperlipidemia: Secondary | ICD-10-CM | POA: Diagnosis not present

## 2015-05-10 DIAGNOSIS — R8299 Other abnormal findings in urine: Secondary | ICD-10-CM | POA: Diagnosis not present

## 2015-05-10 DIAGNOSIS — E119 Type 2 diabetes mellitus without complications: Secondary | ICD-10-CM | POA: Diagnosis not present

## 2015-05-10 DIAGNOSIS — M859 Disorder of bone density and structure, unspecified: Secondary | ICD-10-CM | POA: Diagnosis not present

## 2015-05-10 DIAGNOSIS — I10 Essential (primary) hypertension: Secondary | ICD-10-CM | POA: Diagnosis not present

## 2015-05-10 DIAGNOSIS — N390 Urinary tract infection, site not specified: Secondary | ICD-10-CM | POA: Diagnosis not present

## 2015-05-10 DIAGNOSIS — E039 Hypothyroidism, unspecified: Secondary | ICD-10-CM | POA: Diagnosis not present

## 2015-05-10 DIAGNOSIS — E559 Vitamin D deficiency, unspecified: Secondary | ICD-10-CM | POA: Diagnosis not present

## 2015-05-19 DIAGNOSIS — Z6831 Body mass index (BMI) 31.0-31.9, adult: Secondary | ICD-10-CM | POA: Diagnosis not present

## 2015-05-19 DIAGNOSIS — R413 Other amnesia: Secondary | ICD-10-CM | POA: Diagnosis not present

## 2015-05-19 DIAGNOSIS — I10 Essential (primary) hypertension: Secondary | ICD-10-CM | POA: Diagnosis not present

## 2015-05-19 DIAGNOSIS — E119 Type 2 diabetes mellitus without complications: Secondary | ICD-10-CM | POA: Diagnosis not present

## 2015-05-19 DIAGNOSIS — I2510 Atherosclerotic heart disease of native coronary artery without angina pectoris: Secondary | ICD-10-CM | POA: Diagnosis not present

## 2015-05-19 DIAGNOSIS — E038 Other specified hypothyroidism: Secondary | ICD-10-CM | POA: Diagnosis not present

## 2015-05-25 DIAGNOSIS — E119 Type 2 diabetes mellitus without complications: Secondary | ICD-10-CM | POA: Diagnosis not present

## 2015-05-25 DIAGNOSIS — I2510 Atherosclerotic heart disease of native coronary artery without angina pectoris: Secondary | ICD-10-CM | POA: Diagnosis not present

## 2015-05-25 DIAGNOSIS — I10 Essential (primary) hypertension: Secondary | ICD-10-CM | POA: Diagnosis not present

## 2015-05-25 DIAGNOSIS — Z6830 Body mass index (BMI) 30.0-30.9, adult: Secondary | ICD-10-CM | POA: Diagnosis not present

## 2015-05-30 ENCOUNTER — Telehealth: Payer: Self-pay | Admitting: Cardiovascular Disease

## 2015-05-30 NOTE — Telephone Encounter (Signed)
Patient's daughter called to report high blood pressure since March.  In the last week, she has seen Dr. Virgina Jock 3 times for blood pressure checks. At each check, her systolic BP has been between 174-211. She st "it has been staying up over 200." She st Dr. Virgina Jock has made several medication changes.  Patient stopped Toprol and started Labetalol 200 mg BID. Hydralazine is increased to 25 mg TID.  Amlodipine 10 mg was started last night after pressure was 200/80.  She reports her mom has been tired and sleeps very late. Dr. Virgina Jock has instructed the patient to go to the ED, but the patient refuses.  She has another follow-up appointment with Dr. Virgina Jock tomorrow for a blood pressure check.  Instructed her keep Dr. Keane Police appointment and to follow his recommendations. If he st to go to the ED, go to the ED. If he needs to call [facility] and get an appointment, he is more than welcome.   Med list updated.

## 2015-05-30 NOTE — Telephone Encounter (Signed)
New message     Yesterday the daughter of the pt was told by the primary care physician nurse at the office, to call and recommendations about the blood pressure issues her Mom is having the blood pressure is staying 190/200 according to the daughter   Pt c/o BP issue: STAT if pt c/o blurred vision, one-sided weakness or slurred speech  1. What are your last 5 BP readings? Just one 190/200 taken yesterday  2. Are you having any other symptoms (ex. Dizziness, headache, blurred vision, passed out)? No   3. What is your BP issue? Daughter concerned about the blood pressure being so high

## 2015-05-31 DIAGNOSIS — Z6831 Body mass index (BMI) 31.0-31.9, adult: Secondary | ICD-10-CM | POA: Diagnosis not present

## 2015-05-31 DIAGNOSIS — I119 Hypertensive heart disease without heart failure: Secondary | ICD-10-CM | POA: Diagnosis not present

## 2015-06-23 DIAGNOSIS — I2510 Atherosclerotic heart disease of native coronary artery without angina pectoris: Secondary | ICD-10-CM | POA: Diagnosis not present

## 2015-06-23 DIAGNOSIS — I700 Atherosclerosis of aorta: Secondary | ICD-10-CM | POA: Diagnosis not present

## 2015-06-23 DIAGNOSIS — E038 Other specified hypothyroidism: Secondary | ICD-10-CM | POA: Diagnosis not present

## 2015-06-23 DIAGNOSIS — E784 Other hyperlipidemia: Secondary | ICD-10-CM | POA: Diagnosis not present

## 2015-06-23 DIAGNOSIS — Z8542 Personal history of malignant neoplasm of other parts of uterus: Secondary | ICD-10-CM | POA: Diagnosis not present

## 2015-06-23 DIAGNOSIS — R234 Changes in skin texture: Secondary | ICD-10-CM | POA: Insufficient documentation

## 2015-06-23 DIAGNOSIS — R2689 Other abnormalities of gait and mobility: Secondary | ICD-10-CM | POA: Diagnosis not present

## 2015-06-23 DIAGNOSIS — I119 Hypertensive heart disease without heart failure: Secondary | ICD-10-CM | POA: Diagnosis not present

## 2015-06-23 DIAGNOSIS — Z6831 Body mass index (BMI) 31.0-31.9, adult: Secondary | ICD-10-CM | POA: Diagnosis not present

## 2015-06-23 DIAGNOSIS — F325 Major depressive disorder, single episode, in full remission: Secondary | ICD-10-CM | POA: Insufficient documentation

## 2015-06-23 DIAGNOSIS — E119 Type 2 diabetes mellitus without complications: Secondary | ICD-10-CM | POA: Diagnosis not present

## 2015-06-23 DIAGNOSIS — Z0000 Encounter for general adult medical examination without abnormal findings: Secondary | ICD-10-CM | POA: Diagnosis not present

## 2015-06-25 DIAGNOSIS — L821 Other seborrheic keratosis: Secondary | ICD-10-CM | POA: Insufficient documentation

## 2015-06-25 DIAGNOSIS — I8390 Asymptomatic varicose veins of unspecified lower extremity: Secondary | ICD-10-CM | POA: Insufficient documentation

## 2015-06-25 DIAGNOSIS — D692 Other nonthrombocytopenic purpura: Secondary | ICD-10-CM | POA: Insufficient documentation

## 2015-06-26 ENCOUNTER — Telehealth: Payer: Self-pay | Admitting: Cardiovascular Disease

## 2015-07-20 DIAGNOSIS — L304 Erythema intertrigo: Secondary | ICD-10-CM | POA: Diagnosis not present

## 2015-07-20 DIAGNOSIS — C44529 Squamous cell carcinoma of skin of other part of trunk: Secondary | ICD-10-CM | POA: Diagnosis not present

## 2015-07-20 DIAGNOSIS — L821 Other seborrheic keratosis: Secondary | ICD-10-CM | POA: Diagnosis not present

## 2015-08-03 ENCOUNTER — Encounter: Payer: Self-pay | Admitting: Physician Assistant

## 2015-08-03 NOTE — Progress Notes (Signed)
Cardiology Office Note    Date:  08/04/2015  ID:  Diana Wood, DOB 1932/01/29, MRN [id number] PCP:  Precious Reel, MD  Cardiologist:  Dr. Johnsie Cancel    Chief Complaint: fatigue  History of Present Illness:  Diana Wood is a 82 y.o. 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 Per Dr. Kyla Balzarine note, h/o normal nuc 2009, prior EF 79%. Last labs from 2014 showed Hgb 9.4, Cr 0.76. She recently saw her PCP and was complaining of fatigue and pulse in the 40s-50s. She has also been followed recently by PCP for BP management. She was tried on labetalol then metoprolol but these made her a "zombie." They were subsequently discontinued. She has not taken either since June. She was started on hydralazine & HCTZ, and amlodipine was increased. Her BP has significantly improved but her fatigue persists. She states this has been present for several months but is getting worse. She has been tracking her HR and over the last few weeks her HR has been dropping in the 30s-40s.   EKG today shows high grade heart block with narrow escape QRS, 49bpm. She denies any syncope. She continues to have marginal twinges of chest discomfort without particular pattern. No exertional symptoms. No SOB, LEE, orthopnea.   Past Medical History:  Diagnosis Date  . Anemia   . CAD (coronary artery disease)    a. s/p CABG 2008.  [name] Chest pain   . Deep vein thrombosis ([place])   . Diabetes mellitus   . GERD (gastroesophageal reflux disease)   . Hiatal hernia   . Hypercholesterolemia   . Hyperlipidemia   . Hypertension   . Hypothyroid   . Obesity   . Pleural effusion   . Uterine cancer [facility])     Past Surgical History:  Procedure Laterality Date  . ABDOMINAL HYSTERECTOMY    . bilateral feet surgery    . bilateral knee surgeries    . CATARACT EXTRACTION, BILATERAL    . CORONARY ARTERY  BYPASS GRAFT     x 4-11'08-Dr. Bartle([facility])  . endoscopic vein harvesting from both thighs     Gilford Raid MD  . excision cyst debridement fusion distal interphalangeal joint ring finger  with Mini Acutrak screw 20 mm in length    . EYE SURGERY     corneal transplant -right  . hysterectomy  2000   dx of uterine cancer  . JOINT REPLACEMENT     right knee replacement-Dr. supple  . surgery on 2 fingers     r HAND  . TOTAL KNEE ARTHROPLASTY Left 03/16/2012   Procedure: TOTAL KNEE ARTHROPLASTY;  Surgeon: Gearlean Alf, MD;  Location: [facility];  Service: Orthopedics;  Laterality: Left;    Current Medications: Current Outpatient Prescriptions  Medication Sig Dispense Refill  . amLODipine (NORVASC) 10 MG tablet Take 10 mg by mouth daily.    . Cholecalciferol (VITAMIN D PO) Take 2,000 Units by mouth daily.    . fenofibrate (TRICOR) 145 MG tablet Take 145 mg by mouth every morning.     . hydrALAZINE (APRESOLINE) 25 MG tablet Take 25 mg by mouth 3 (three) times daily.    . hydrochlorothiazide (HYDRODIURIL) 12.5 MG tablet Take 12.5 mg by mouth daily.    [name] levothyroxine (SYNTHROID, LEVOTHROID) 125 MCG tablet Take 125 mcg by mouth every morning.     . metFORMIN (GLUMETZA) 500  MG (MOD) 24 hr tablet Take 1,000 mg by mouth daily.     [name] omeprazole (PRILOSEC) 20 MG capsule Take 20 mg by mouth daily.      . prednisoLONE acetate (PRED FORTE) 1 % ophthalmic suspension Place 1 drop into both eyes daily.    . sertraline (ZOLOFT) 25 MG tablet Take 25 mg by mouth daily.     No current facility-administered medications for this visit.      Allergies:   Losartan and Tape   Social History   Social History  . Marital status: Married    Spouse name: N/A  . Number of children: 3  . Years of education: N/A   Occupational History  . retired Retired    Network engineer   Social History Main Topics  . Smoking status: Never Smoker  . Smokeless tobacco: Never Used  . Alcohol use No  . Drug use: No  . Sexual  activity: Yes   Other Topics Concern  . None   Social History Narrative  . None     Family History:  The patient's family history includes Blindness in her brother; Cancer in her mother; Diabetes in her mother.   ROS:   Please see the history of present illness.  All other systems are reviewed and otherwise negative.    PHYSICAL EXAM:   VS:  BP (!) 146/60   Pulse (!) 49   Ht 5' (1.524 m)   Wt 161 lb 9.6 oz (73.3 kg)   BMI 31.56 kg/m   BMI: Body mass index is 31.56 kg/m. GEN: Well nourished, well developed WF, in no acute distress - appears younger than stated age 32: normocephalic, atraumatic Neck: no JVD, carotid bruits, or masses Cardiac: Irregular, borderline bradycardic, no murmurs, rubs, or gallops, no edema  Respiratory:  clear to auscultation bilaterally, normal work of breathing GI: soft, nontender, nondistended, + BS MS: no deformity or atrophy  Skin: warm and dry, no rash Neuro:  Alert and Oriented x 3, Strength and sensation are intact, follows commands Psych: euthymic mood, full affect  Wt Readings from Last 3 Encounters:  08/04/15 161 lb 9.6 oz (73.3 kg)  02/07/15 165 lb 6.4 oz (75 kg)  08/09/14 170 lb 3.2 oz (77.2 kg)      Studies/Labs Reviewed:   EKG:  EKG was ordered today and personally reviewed by me and demonstrates high grade heart block 49bpm with narrow complex escape.  Recent Labs: No results found for requested labs within last 8760 hours.   Lipid Panel   Additional studies/ records that were reviewed today include: Summarized above.    ASSESSMENT & PLAN:   1. Bradycardia with high grade heart block - patient seen/examined with Dr. Lovena Le given her heart block on EKG. No longer on any AVN blocking agents as of June. Dr. Lovena Le agrees patient would benefit from [facility] implantation for symptomatic bradycardia. She does have a sore in her left upper chest considered to be a skin cancer that is being re-evaluated by dermatology early next  week. She also has a funeral she strongly wants to attend this evening. In light of these, Dr. Lovena Le has recommended early EP office f/u next week to revisit the skin sore issue - if improved at that time, would proceed with PPM implantation next week. Warning symptoms reviewed. She has also been instructed not to drive until cleared s/p PPM. Check labs to ensure no metabolic abnormality causing her bradycardia. 2. CAD s/p CABG - no recent anginal sx.  3. Essential HTN - being actively managed and followed by primary care. Will not push further antihypertensives until pacemaker is placed. 4. Anemia on prior labs - further monitoring per primary care. Will update labs to follow.  Disposition: We are coordinating with the EP team to schedule her EP consult and PPM implantation. Further appts TBD based on timing of implantation.   Medication Adjustments/Labs and Tests Ordered: Current medicines are reviewed at length with the patient today.  Concerns regarding medicines are outlined above. Medication changes, Labs and Tests ordered today are summarized above and listed in the Patient Instructions accessible in Encounters.   Raechel Ache PA-C  08/04/2015 9:41 AM    [address], [place], [place]  [address] Phone: [phone]; Fax: [phone]

## 2015-08-04 ENCOUNTER — Encounter: Payer: Self-pay | Admitting: Physician Assistant

## 2015-08-04 ENCOUNTER — Ambulatory Visit (INDEPENDENT_AMBULATORY_CARE_PROVIDER_SITE_OTHER): Payer: Medicare Other | Admitting: Physician Assistant

## 2015-08-04 VITALS — BP 146/60 | HR 49 | Ht 60.0 in | Wt 161.6 lb

## 2015-08-04 DIAGNOSIS — I10 Essential (primary) hypertension: Secondary | ICD-10-CM

## 2015-08-04 DIAGNOSIS — D649 Anemia, unspecified: Secondary | ICD-10-CM

## 2015-08-04 DIAGNOSIS — I459 Conduction disorder, unspecified: Secondary | ICD-10-CM | POA: Insufficient documentation

## 2015-08-04 DIAGNOSIS — I2510 Atherosclerotic heart disease of native coronary artery without angina pectoris: Secondary | ICD-10-CM | POA: Diagnosis not present

## 2015-08-04 DIAGNOSIS — R001 Bradycardia, unspecified: Secondary | ICD-10-CM

## 2015-08-04 LAB — BASIC METABOLIC PANEL
BUN: 18 mg/dL (ref 7–25)
CALCIUM: 9.8 mg/dL (ref 8.6–10.4)
CO2: 26 mmol/L (ref 20–31)
CREATININE: 0.88 mg/dL (ref 0.60–0.88)
Chloride: 100 mmol/L (ref 98–110)
GLUCOSE: 149 mg/dL — AB (ref 65–99)
Potassium: 4.2 mmol/L (ref 3.5–5.3)
Sodium: 139 mmol/L (ref 135–146)

## 2015-08-04 LAB — CBC WITH DIFFERENTIAL/PLATELET
BASOS ABS: 0 {cells}/uL (ref 0–200)
Basophils Relative: 0 %
EOS PCT: 2 %
Eosinophils Absolute: 170 cells/uL (ref 15–500)
HEMATOCRIT: 35 % (ref 35.0–45.0)
HEMOGLOBIN: 11.6 g/dL — AB (ref 11.7–15.5)
Lymphocytes Relative: 49 %
Lymphs Abs: 4165 cells/uL — ABNORMAL HIGH (ref 850–3900)
MCH: 28 pg (ref 27.0–33.0)
MCHC: 33.1 g/dL (ref 32.0–36.0)
MCV: 84.3 fL (ref 80.0–100.0)
MONOS PCT: 12 %
MPV: 11.2 fL (ref 7.5–12.5)
Monocytes Absolute: 1020 cells/uL — ABNORMAL HIGH (ref 200–950)
NEUTROS PCT: 37 %
Neutro Abs: 3145 cells/uL (ref 1500–7800)
Platelets: 301 10*3/uL (ref 140–400)
RBC: 4.15 MIL/uL (ref 3.80–5.10)
RDW: 14.3 % (ref 11.0–15.0)
WBC: 8.5 10*3/uL (ref 3.8–10.8)

## 2015-08-04 LAB — MAGNESIUM: MAGNESIUM: 1.6 mg/dL (ref 1.5–2.5)

## 2015-08-04 LAB — TSH: TSH: 0.07 m[IU]/L — AB

## 2015-08-04 NOTE — Patient Instructions (Addendum)
Medication Instructions:  Your physician recommends that you continue on your current medications as directed. Please refer to the Current Medication list given to you today.   Labwork: Bmet, Mag, Cbc, Tsh  Testing/Procedures: None ordered  Follow-Up: You have been referred to EP  Your appointment is scheduled for 08/09/15 @ 10:30 with [facility])    Any Other Special Instructions Will Be Listed Below (If Applicable). NO Driving     If you need a refill on your cardiac medications before your next appointment, please call your pharmacy.

## 2015-08-07 DIAGNOSIS — L821 Other seborrheic keratosis: Secondary | ICD-10-CM | POA: Diagnosis not present

## 2015-08-07 DIAGNOSIS — L309 Dermatitis, unspecified: Secondary | ICD-10-CM | POA: Diagnosis not present

## 2015-08-07 DIAGNOSIS — L601 Onycholysis: Secondary | ICD-10-CM | POA: Diagnosis not present

## 2015-08-07 DIAGNOSIS — L905 Scar conditions and fibrosis of skin: Secondary | ICD-10-CM | POA: Diagnosis not present

## 2015-08-08 ENCOUNTER — Encounter: Payer: Self-pay | Admitting: Physician Assistant

## 2015-08-09 ENCOUNTER — Ambulatory Visit (INDEPENDENT_AMBULATORY_CARE_PROVIDER_SITE_OTHER): Payer: Medicare Other | Admitting: Physician Assistant

## 2015-08-09 ENCOUNTER — Encounter: Payer: Self-pay | Admitting: Physician Assistant

## 2015-08-09 VITALS — BP 148/56 | HR 42 | Ht 60.0 in | Wt 164.0 lb

## 2015-08-09 DIAGNOSIS — I10 Essential (primary) hypertension: Secondary | ICD-10-CM

## 2015-08-09 DIAGNOSIS — I4430 Unspecified atrioventricular block: Secondary | ICD-10-CM

## 2015-08-09 DIAGNOSIS — I2510 Atherosclerotic heart disease of native coronary artery without angina pectoris: Secondary | ICD-10-CM

## 2015-08-09 NOTE — Progress Notes (Signed)
Cardiology Office Consult Date:  08/09/2015  Patient ID:  Diana, Wood 11/18/1932, MRN [id number] PCP:  Precious Reel, MD  Cardiologist:  Dr. Johnsie Cancel   Chief Complaint:  Evaluate for PPM implant  History of Present Illness: Diana Wood is a 82 y.o. female with history of CAD s/p CABG 11/2006, h/o atypical chest pain felt due to neuropathy/sternotomy, HTN, HLD, uterine cancer, hypothyroidism, DVT, GERD, anemia, and recently noted symptomatic bradycardia and high degree AVBlock.  She was seen by Melina Copa 08/04/15 for cardiology service who noted:  "Per Dr. Kyla Balzarine note, h/o normal nuc 2009, prior EF 79%. Last labs from 2014 showed Hgb 9.4, Cr 0.76. She recently saw her PCP and was complaining of fatigue and pulse in the 40s-50s. She has also been followed recently by PCP for BP management. She was tried on labetalol then metoprolol but these made her a "zombie." They were subsequently discontinued. She has not taken either since June. She was started on hydralazine & HCTZ, and amlodipine was increased. Her BP has significantly improved but her fatigue persists. She states this has been present for several months but is getting worse. She has been tracking her HR and over the last few weeks her HR has been dropping in the 30s-40s. EKG today shows high grade heart block with narrow escape QRS, 49bpm."  She comes in today to be seen/evaluated by EP for possible PPM implant.  At her visit with Dayna last week, there was discussion with Dr. Lovena Le who recommended PPM, though a skin wound needed further derm evaluation and the patient had personal things she needed to attend to.  She is feeling fairly well, no new symptoms, no near syncope or syncope, has moments when she feels she gets off balance and has to stop and collect herself, no falls.  She denies anginal sounding CP, no palpitations or SOB, she sleeps well, no symptoms of orthopnea or PND.  S he saw her dermatologist Monday, Dr. Allyson Sabal, and  the patient reports he did not feel the wound was infected. 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 Curt Bears as well today, he does not feel the skin wound is infected either, discussed risks/benefits of PPM implant with the patient (daughter/husband also present).  Past Medical History:  Diagnosis Date  . Anemia   . CAD (coronary artery disease)    a. s/p CABG 2008.  [name] Chest pain   . Deep vein thrombosis ([place])   . Diabetes mellitus   . GERD (gastroesophageal reflux disease)   . Hiatal hernia   . Hypercholesterolemia   . Hyperlipidemia   . Hypertension   . Hypothyroid   . Obesity   . Pleural effusion   . Uterine cancer [facility])     Past Surgical History:  Procedure Laterality Date  . ABDOMINAL HYSTERECTOMY    . bilateral feet surgery    . bilateral knee surgeries    . CATARACT EXTRACTION, BILATERAL    . CORONARY ARTERY BYPASS GRAFT     x 4-11'08-Dr. Bartle([facility])  . endoscopic vein harvesting from both thighs     Gilford Raid MD  . excision cyst debridement fusion distal interphalangeal joint ring finger  with Mini Acutrak screw 20 mm in length    . EYE SURGERY     corneal  transplant -right  . hysterectomy  2000   dx of uterine cancer  . JOINT REPLACEMENT     right knee replacement-Dr. supple  . surgery on 2 fingers     r HAND  . TOTAL KNEE ARTHROPLASTY Left 03/16/2012   Procedure: TOTAL KNEE ARTHROPLASTY;  Surgeon: Gearlean Alf, MD;  Location: [facility];  Service: Orthopedics;  Laterality: Left;    Current Outpatient Prescriptions  Medication Sig Dispense Refill  . amLODipine (NORVASC) 10 MG tablet Take 10 mg by mouth daily.    . Cholecalciferol (VITAMIN D PO) Take 2,000 Units by mouth daily.    . fenofibrate (TRICOR) 145 MG tablet Take 145 mg by mouth every morning.     .  hydrALAZINE (APRESOLINE) 25 MG tablet Take 25 mg by mouth 3 (three) times daily.    . hydrochlorothiazide (HYDRODIURIL) 12.5 MG tablet Take 12.5 mg by mouth daily.    [name] levothyroxine (SYNTHROID, LEVOTHROID) 125 MCG tablet Take 125 mcg by mouth every morning.     . metFORMIN (GLUMETZA) 500 MG (MOD) 24 hr tablet Take 1,000 mg by mouth daily.     [name] omeprazole (PRILOSEC) 20 MG capsule Take 20 mg by mouth daily.      . prednisoLONE acetate (PRED FORTE) 1 % ophthalmic suspension Place 1 drop into both eyes daily.    . sertraline (ZOLOFT) 25 MG tablet Take 25 mg by mouth daily.     No current facility-administered medications for this visit.     Allergies:   Losartan and Tape   Social History:  The patient  reports that she has never smoked. She has never used smokeless tobacco. She reports that she does not drink alcohol or use drugs.   Family History:  The patient's family history includes Blindness in her brother; Cancer in her mother; Diabetes in her mother.  ROS:  Please see the history of present illness.   All other systems are reviewed and otherwise negative.   PHYSICAL EXAM:  VS:  BP (!) 148/56 (BP Location: Left Arm, Patient Position: Sitting, Cuff Size: Normal)   Pulse (!) 42   Ht 5' (1.524 m)   Wt 164 lb (74.4 kg)   BMI 32.03 kg/m  BMI: Body mass index is 32.03 kg/m. Well nourished, well developed, in no acute distress  HEENT: normocephalic, atraumatic  Neck: no JVD, carotid bruits or masses Cardiac:  RRR; bradycardic, no significant murmurs, no rubs, or gallops Lungs:  clear to auscultation bilaterally, no wheezing, rhonchi or rales  Abd: soft, nontender MS: no deformity or atrophy Ext: no edema  Skin: warm and dry, no rash, R chest circular skin wound with pink border, small amount of slightly green colored thick drainage (patient has been using mupirocin cream at the direction of her dermatologist) Neuro:  No gross deficits appreciated Psych: euthymic mood, full  affect   EKG:  Done today shows 2:1 heart block, V rate 42bpm (reviewed with Dr. Curt Bears) 08/04/15 High degree AVblock, 49bpm, narrow QRS  Dr. Johnsie Cancel noted in his last note : she had a normal Myoview on May 18, 2007. There was no ischemia with an ejection fraction of 75%  Recent Labs: 08/04/2015: BUN 18; Creat 0.88; Hemoglobin 11.6; Magnesium 1.6; Platelets 301; Potassium 4.2; Sodium 139; TSH 0.07  No results found for requested labs within last 8760 hours.   Estimated Creatinine Clearance: 45.2 mL/min (by C-G formula based on SCr of 0.88 mg/dL).   Wt Readings from Last 3 Encounters:  08/09/15 164 lb (  74.4 kg)  08/04/15 161 lb 9.6 oz (73.3 kg)  02/07/15 165 lb 6.4 oz (75 kg)     Other studies reviewed: Additional studies/records reviewed today include: summarized above  ASSESSMENT AND PLAN:  1. High degree AVBlock, bradycardia     no reversible causes, off AVnodal blocking agents since June, definitely none in all of July     Risk/benefits of PPM implant were discussed with the patient by Dr. Curt Bears and she wishes to proceed Pt record was reviewed by Dr. Curt Bears, given no symptom, no need for echo pre-pacer, will proceed with implant tomorrow.  Will be right sided implant, activity restrictions discussed for post-implant.  2. HTN     stable  3. CAD     no anginal c/o     c/w Dr. Johnsie Cancel  Disposition: F/u with post-implant wound check and f/u with Dr. Curt Bears as usual, if any progressive symptoms to seek medical attention, and reminded again, no driving until cleared post implant  Current medicines are reviewed at length with the patient today.  The patient did not have any concerns regarding medicines.  Haywood Lasso, PA-C 08/09/2015 11:18 AM     [facility] [address] [place]  [place] [address] [phone] (office)  [phone] (fax)

## 2015-08-09 NOTE — Patient Instructions (Addendum)
Medication Instructions:   Your physician recommends that you continue on your current medications as directed. Please refer to the Current Medication list given to you today.   If you need a refill on your cardiac medications before your next appointment, please call your pharmacy.  Labwork: NONE ORDER TODAY    Testing/Procedures: SEE UNDER SPECIAL INSTRUCTIONS    Follow-Up: 10 DAY WOUND CHECK  AFTER  08/10/2015      [place]  AFTER 08/10/2015.[name] WITH DR CAMNITZ    Any Other Special Instructions Will Be Listed Below (If Applicable).                                                    Implantable Device Instructions     You are scheduled for:  IMPLANTATION OF PACEMAKER       on  08/10/2015  with Dr. Curt Bears @ 10 30 AM    1.   Please arrive at the [facility] at [facility] on the day of your  Procedure. @8 :30 AM   THE FRONT DESK CLERK CAN GUIDE YOU DOWN THE HALL TO  ADMITTING  2. Do not eat or drink  AFTER MIDNIGHT the night before your procedure  3.  YOUR lab work  HAS BEEN COMPLETED .     4.  DO NOT TAKE THESE MEDICATIONS  PRIOR TO THE MORNING your   procedure:  NO  MEDICATIONS TO HOLD          5.  Plan for an overnight stay.  Bring your insurance cards and a list of you  medications.  6.  Wash your chest and neck with antibacterial soap (any brand) the evening before   and the morning of your procedure.  Rinse well. CHG SURGICAL SCRUB INCLUDED WITH INSTRUCTIONS

## 2015-08-10 ENCOUNTER — Encounter (HOSPITAL_COMMUNITY)
Admission: RE | Disposition: A | Discharge status: Home / Self Care | Payer: Self-pay | Source: Ambulatory Visit | Attending: Cardiology

## 2015-08-10 ENCOUNTER — Ambulatory Visit (HOSPITAL_COMMUNITY)
Admission: RE | Admit: 2015-08-10 | Discharge: 2015-08-11 | Disposition: A | Discharge status: Home / Self Care | Payer: Medicare Other | Source: Ambulatory Visit | Attending: Cardiology | Admitting: Cardiology

## 2015-08-10 ENCOUNTER — Encounter (HOSPITAL_COMMUNITY): Payer: Self-pay | Admitting: Cardiology

## 2015-08-10 DIAGNOSIS — Z95818 Presence of other cardiac implants and grafts: Secondary | ICD-10-CM

## 2015-08-10 DIAGNOSIS — E039 Hypothyroidism, unspecified: Secondary | ICD-10-CM | POA: Insufficient documentation

## 2015-08-10 DIAGNOSIS — C55 Malignant neoplasm of uterus, part unspecified: Secondary | ICD-10-CM | POA: Diagnosis not present

## 2015-08-10 DIAGNOSIS — I82409 Acute embolism and thrombosis of unspecified deep veins of unspecified lower extremity: Secondary | ICD-10-CM | POA: Insufficient documentation

## 2015-08-10 DIAGNOSIS — I2510 Atherosclerotic heart disease of native coronary artery without angina pectoris: Secondary | ICD-10-CM | POA: Insufficient documentation

## 2015-08-10 DIAGNOSIS — R001 Bradycardia, unspecified: Secondary | ICD-10-CM | POA: Diagnosis not present

## 2015-08-10 DIAGNOSIS — I442 Atrioventricular block, complete: Secondary | ICD-10-CM | POA: Diagnosis not present

## 2015-08-10 DIAGNOSIS — I10 Essential (primary) hypertension: Secondary | ICD-10-CM | POA: Diagnosis not present

## 2015-08-10 DIAGNOSIS — E785 Hyperlipidemia, unspecified: Secondary | ICD-10-CM | POA: Insufficient documentation

## 2015-08-10 DIAGNOSIS — K219 Gastro-esophageal reflux disease without esophagitis: Secondary | ICD-10-CM | POA: Diagnosis not present

## 2015-08-10 HISTORY — PX: EP IMPLANTABLE DEVICE: SHX172B

## 2015-08-10 LAB — SURGICAL PCR SCREEN
MRSA, PCR: NEGATIVE
STAPHYLOCOCCUS AUREUS: NEGATIVE

## 2015-08-10 LAB — GLUCOSE, CAPILLARY
GLUCOSE-CAPILLARY: 147 mg/dL — AB (ref 65–99)
Glucose-Capillary: 131 mg/dL — ABNORMAL HIGH (ref 65–99)

## 2015-08-10 SURGERY — PACEMAKER IMPLANT
Anesthesia: LOCAL

## 2015-08-10 MED ORDER — SODIUM CHLORIDE 0.9 % IR SOLN: 80.0000 mg | Status: DC

## 2015-08-10 MED ORDER — HEPARIN (PORCINE) IN NACL 2-0.9 UNIT/ML-% IJ SOLN
INTRAMUSCULAR | Status: DC | PRN
  Administered 2015-08-10: 500 mL

## 2015-08-10 MED ORDER — CEFAZOLIN SODIUM-DEXTROSE 2-4 GM/100ML-% IV SOLN
2.0000 g | INTRAVENOUS | Status: AC
  Administered 2015-08-10: 2 g via INTRAVENOUS

## 2015-08-10 MED ORDER — MUPIROCIN 2 % EX OINT: 1.0000 "application " | TOPICAL_OINTMENT | Freq: Once | CUTANEOUS | Status: DC

## 2015-08-10 MED ORDER — ACETAMINOPHEN 325 MG PO TABS
325.0000 mg | ORAL_TABLET | ORAL | Status: DC | PRN
  Administered 2015-08-11: 650 mg via ORAL

## 2015-08-10 MED ORDER — SODIUM CHLORIDE 0.9 % IR SOLN
Status: AC
  Filled 2015-08-10: qty 2

## 2015-08-10 MED ORDER — METFORMIN HCL ER 500 MG PO TB24: 1000.0000 mg | ORAL_TABLET | Freq: Every day | ORAL | Status: DC

## 2015-08-10 MED ORDER — MUPIROCIN 2 % EX OINT
TOPICAL_OINTMENT | CUTANEOUS | Status: AC
  Administered 2015-08-10: 09:00:00
  Filled 2015-08-10: qty 22

## 2015-08-10 MED ORDER — CHLORHEXIDINE GLUCONATE 4 % EX LIQD
60.0000 mL | Freq: Once | CUTANEOUS | Status: DC
  Filled 2015-08-10: qty 60

## 2015-08-10 MED ORDER — SERTRALINE HCL 50 MG PO TABS
25.0000 mg | ORAL_TABLET | Freq: Every day | ORAL | Status: DC
  Administered 2015-08-11: 25 mg via ORAL
  Filled 2015-08-10: qty 1

## 2015-08-10 MED ORDER — CEFAZOLIN SODIUM-DEXTROSE 2-4 GM/100ML-% IV SOLN
INTRAVENOUS | Status: AC
  Filled 2015-08-10: qty 100

## 2015-08-10 MED ORDER — CEFAZOLIN IN D5W 1 GM/50ML IV SOLN
1.0000 g | Freq: Four times a day (QID) | INTRAVENOUS | Status: AC
  Administered 2015-08-10 – 2015-08-11 (×3): 1 g via INTRAVENOUS
  Filled 2015-08-10 (×3): qty 50

## 2015-08-10 MED ORDER — LEVOTHYROXINE SODIUM 25 MCG PO TABS
125.0000 ug | ORAL_TABLET | Freq: Every day | ORAL | Status: DC
  Administered 2015-08-11: 125 ug via ORAL
  Filled 2015-08-10: qty 1

## 2015-08-10 MED ORDER — HYDROCHLOROTHIAZIDE 25 MG PO TABS
12.5000 mg | ORAL_TABLET | Freq: Every day | ORAL | Status: DC
  Administered 2015-08-11: 12.5 mg via ORAL
  Filled 2015-08-10: qty 1

## 2015-08-10 MED ORDER — SODIUM CHLORIDE 0.9 % IR SOLN
Status: DC | PRN
  Administered 2015-08-10: 13:00:00

## 2015-08-10 MED ORDER — MUPIROCIN 2 % EX OINT
1.0000 "application " | TOPICAL_OINTMENT | Freq: Two times a day (BID) | CUTANEOUS | Status: DC
  Administered 2015-08-10: 1 via TOPICAL
  Filled 2015-08-10: qty 22

## 2015-08-10 MED ORDER — IOPAMIDOL (ISOVUE-370) INJECTION 76%
INTRAVENOUS | Status: DC | PRN
  Administered 2015-08-10: 20 mL via INTRAVENOUS

## 2015-08-10 MED ORDER — FENTANYL CITRATE (PF) 100 MCG/2ML IJ SOLN
INTRAMUSCULAR | Status: AC
  Filled 2015-08-10: qty 2

## 2015-08-10 MED ORDER — FENTANYL CITRATE (PF) 100 MCG/2ML IJ SOLN
INTRAMUSCULAR | Status: DC | PRN
  Administered 2015-08-10: 25 ug via INTRAVENOUS

## 2015-08-10 MED ORDER — LIDOCAINE HCL (PF) 1 % IJ SOLN
INTRAMUSCULAR | Status: DC | PRN
  Administered 2015-08-10: 50 mL

## 2015-08-10 MED ORDER — AMLODIPINE BESYLATE 10 MG PO TABS
10.0000 mg | ORAL_TABLET | ORAL | Status: DC
Start: 2015-08-10 — End: 2015-08-11
  Administered 2015-08-10: 10 mg via ORAL
  Filled 2015-08-10: qty 1

## 2015-08-10 MED ORDER — PANTOPRAZOLE SODIUM 40 MG PO TBEC
40.0000 mg | DELAYED_RELEASE_TABLET | Freq: Every day | ORAL | Status: DC
Start: 2015-08-10 — End: 2015-08-11
  Administered 2015-08-10 – 2015-08-11 (×2): 40 mg via ORAL
  Filled 2015-08-10 (×2): qty 1

## 2015-08-10 MED ORDER — PREDNISOLONE ACETATE 1 % OP SUSP
1.0000 [drp] | Freq: Every day | OPHTHALMIC | Status: DC
  Administered 2015-08-10: 1 [drp] via OPHTHALMIC
  Filled 2015-08-10: qty 1

## 2015-08-10 MED ORDER — SODIUM CHLORIDE 0.9 % IV SOLN
INTRAVENOUS | Status: DC
  Administered 2015-08-10: 10:00:00 via INTRAVENOUS

## 2015-08-10 MED ORDER — VITAMIN D 1000 UNITS PO TABS
2000.0000 [IU] | ORAL_TABLET | Freq: Every day | ORAL | Status: DC
  Administered 2015-08-11: 2000 [IU] via ORAL
  Filled 2015-08-10: qty 2

## 2015-08-10 MED ORDER — HEPARIN (PORCINE) IN NACL 2-0.9 UNIT/ML-% IJ SOLN
INTRAMUSCULAR | Status: AC
  Filled 2015-08-10: qty 500

## 2015-08-10 MED ORDER — METFORMIN HCL ER 500 MG PO TB24
1000.0000 mg | ORAL_TABLET | Freq: Every day | ORAL | Status: DC
  Administered 2015-08-11: 1000 mg via ORAL
  Filled 2015-08-10: qty 2

## 2015-08-10 MED ORDER — HYDRALAZINE HCL 20 MG/ML IJ SOLN
INTRAMUSCULAR | Status: AC
  Filled 2015-08-10: qty 1

## 2015-08-10 MED ORDER — FENOFIBRATE 160 MG PO TABS
160.0000 mg | ORAL_TABLET | Freq: Every day | ORAL | Status: DC
  Administered 2015-08-10 – 2015-08-11 (×2): 160 mg via ORAL
  Filled 2015-08-10 (×2): qty 1

## 2015-08-10 MED ORDER — ONDANSETRON HCL 4 MG/2ML IJ SOLN: 4.0000 mg | Freq: Four times a day (QID) | INTRAMUSCULAR | Status: DC | PRN

## 2015-08-10 MED ORDER — MIDAZOLAM HCL 5 MG/5ML IJ SOLN
INTRAMUSCULAR | Status: DC | PRN
  Administered 2015-08-10 (×2): 1 mg via INTRAVENOUS

## 2015-08-10 MED ORDER — HYDRALAZINE HCL 25 MG PO TABS
25.0000 mg | ORAL_TABLET | Freq: Three times a day (TID) | ORAL | Status: DC
  Administered 2015-08-10 – 2015-08-11 (×2): 25 mg via ORAL
  Filled 2015-08-10 (×3): qty 1

## 2015-08-10 MED ORDER — HYDRALAZINE HCL 20 MG/ML IJ SOLN
INTRAMUSCULAR | Status: DC | PRN
  Administered 2015-08-10: 10 mg via INTRAVENOUS

## 2015-08-10 MED ORDER — MIDAZOLAM HCL 5 MG/5ML IJ SOLN
INTRAMUSCULAR | Status: AC
  Filled 2015-08-10: qty 5

## 2015-08-10 SURGICAL SUPPLY — 8 items
CABLE SURGICAL S-101-97-12 (CABLE) ×1 IMPLANT
CATH LOCATOR PLUS 1292 (MISCELLANEOUS) ×1 IMPLANT
ELECT DEFIB PAD ADLT CADENCE (PAD) ×1 IMPLANT
LEAD TENDRIL MRI 46CM LPA1200M (Lead) ×1 IMPLANT
LEAD TENDRIL MRI 52CM LPA1200M (Lead) ×1 IMPLANT
PACEMAKER ASSURITY DR-RF (Pacemaker) ×1 IMPLANT
SHEATH CLASSIC 8F (SHEATH) ×2 IMPLANT
TRAY PACEMAKER INSERTION (PACKS) ×1 IMPLANT

## 2015-08-10 NOTE — Discharge Instructions (Signed)
° ° °  Supplemental Discharge Instructions for  Pacemaker/Defibrillator Patients  Activity No heavy lifting or vigorous activity with your left/right arm for 6 to 8 weeks.  Do not raise your left/right arm above your head for one week.  Gradually raise your affected arm as drawn below.           __       08/14/15                          08/15/15                         08/16/15                  08/17/15  NO DRIVING for    1 week ; you may begin driving on   [id number]  .  WOUND CARE - Keep the wound area clean and dry.  Do not get this area wet for one week. No showers for one week; you may shower on   08/17/15  . - The tape/steri-strips on your wound will fall off; do not pull them off.  No bandage is needed on the site.  DO  NOT apply any creams, oils, or ointments to the wound area. - If you notice any drainage or discharge from the wound, any swelling or bruising at the site, or you develop a fever > 101? F after you are discharged home, call the office at once.  Special Instructions - You are still able to use cellular telephones; use the ear opposite the side where you have your pacemaker/defibrillator.  Avoid carrying your cellular phone near your device. - When traveling through airports, show security personnel your identification card to avoid being screened in the metal detectors.  Ask the security personnel to use the hand wand. - Avoid arc welding equipment, TENS units (transcutaneous nerve stimulators).  Call the office for questions about other devices. - Avoid electrical appliances that are in poor condition or are not properly grounded. - Microwave ovens are safe to be near or to operate.

## 2015-08-10 NOTE — H&P (Signed)
Diana Wood presents to the hospital with a history of 2:1 AV block and intermittent complete AV block with a narrow complex QRS.  She has had complaints of fatigue.  Bradycardic, no murmurs, lungs clear. She was evaluated for pacemaker and has agreed to the procedure.  Risks and benefits discussed.  Risks include bleeding, tamponade, infection, pneumothorax.  The patient understands these risks and has agreed to the procedure.  Marguis Mathieson Curt Bears, MD 08/10/2015 11:35 AM

## 2015-08-10 NOTE — Discharge Summary (Signed)
ELECTROPHYSIOLOGY PROCEDURE DISCHARGE SUMMARY    Patient ID: Diana Wood,  MRN: [id number], DOB/AGE: February 04, 1932 82 y.o.  Admit date: 08/10/2015 Discharge date: 08/11/2015  Primary Care Physician: Precious Reel, MD Primary Cardiologist: Johnsie Cancel Electrophysiologist: [facility]  Primary Discharge Diagnosis:  Symptomatic complete heart block status post pacemaker implantation this admission  Secondary Discharge Diagnosis:  1.  CAD 2.  Hypertension 3.  Hyperlipidemia 4.  Hypothyroidism 5.  Uterine cancer 6.  DVT 7.  GERD  Allergies  Allergen Reactions  . Losartan Cough  . Tape Rash    Band aids ok, paper tape ok,     Procedures This Admission:  1.  Implantation of a STJ MRI compatible dual chamber PPM on 08/10/15 by Dr Curt Bears.  See op note for full details.  There were no immediate post procedure complications. 2.  CXR on 08/11/15 demonstrated no pneumothorax status post device implantation.   Brief HPI: Diana Wood is a 82 y.o. female was referred to electrophysiology in the outpatient setting for consideration of PPM implantation.  The patient has had symptomatic complete heart block without reversible causes identified.  Risks, benefits, and alternatives to PPM implantation were reviewed with the patient who wished to proceed.   Hospital Course:  The patient was admitted and underwent implantation of a STJ MRI compatible dual chamber PPM with details as outlined above.  She  was monitored on telemetry overnight which demonstrated AV pacing.  Left chest was without hematoma or ecchymosis.  The device was interrogated and found to be functioning normally.  CXR was obtained and demonstrated no pneumothorax status post device implantation.  Wound care, arm mobility, and restrictions were reviewed with the patient.  The patient was examined and considered stable for discharge to home.    Physical Exam: Vitals:   08/10/15 1608 08/10/15 1817 08/10/15 2033 08/11/15 0413  BP: (!)  133/53   (!) 162/59  Pulse: 69   68  Resp: (!) 22   17  Temp: 97.9 F (36.6 C)  98.9 F (37.2 C) 98.3 F (36.8 C)  TempSrc: Oral  Oral Oral  SpO2: 97%   96%  Weight:  163 lb 12.8 oz (74.3 kg)  158 lb 8 oz (71.9 kg)  Height:  5' (1.524 m)      GEN- The patient is well appearing, alert and oriented x 3 today.   HEENT: normocephalic, atraumatic; sclera clear, conjunctiva pink; hearing intact; oropharynx clear; neck supple  Lungs- Clear to ausculation bilaterally, normal work of breathing.  No wheezes, rales, rhonchi Heart- Regular rate and rhythm  GI- soft, non-tender, non-distended, bowel sounds present  Extremities- no clubbing, cyanosis, or edema; DP/PT/radial pulses 2+ bilaterally MS- no significant deformity or atrophy Skin- warm and dry, no rash or lesion, left chest without hematoma/ecchymosis Psych- euthymic mood, full affect Neuro- strength and sensation are intact   Labs:   Lab Results  Component Value Date   WBC 8.5 08/04/2015   HGB 11.6 (L) 08/04/2015   HCT 35.0 08/04/2015   MCV 84.3 08/04/2015   PLT 301 08/04/2015     Recent Labs Lab 08/04/15 1032  NA 139  K 4.2  CL 100  CO2 26  BUN 18  CREATININE 0.88  CALCIUM 9.8  GLUCOSE 149*    Discharge Medications:    Medication List    TAKE these medications   amLODipine 10 MG tablet Commonly known as:  NORVASC Take 10 mg by mouth daily. At 1500   fenofibrate 145 MG  tablet Commonly known as:  TRICOR Take 145 mg by mouth daily after breakfast.   hydrALAZINE 25 MG tablet Commonly known as:  APRESOLINE Take 25 mg by mouth 3 (three) times daily. After breakfast, at 1500 and at bedtime   hydrochlorothiazide 12.5 MG tablet Commonly known as:  HYDRODIURIL Take 12.5 mg by mouth daily after breakfast.   levothyroxine 125 MCG tablet Commonly known as:  SYNTHROID, LEVOTHROID Take 125 mcg by mouth daily before breakfast.   metFORMIN 500 MG (MOD) 24 hr tablet Commonly known as:  GLUMETZA Take 1,000 mg by  mouth daily before breakfast.   mupirocin ointment 2 % Commonly known as:  BACTROBAN Apply 1 application topically 2 (two) times daily.   omeprazole 20 MG capsule Commonly known as:  PRILOSEC Take 20 mg by mouth daily after breakfast.   prednisoLONE acetate 1 % ophthalmic suspension Commonly known as:  PRED FORTE Place 1 drop into both eyes at bedtime.   sertraline 25 MG tablet Commonly known as:  ZOLOFT Take 25 mg by mouth daily after breakfast.   VITAMIN D PO Take 2,000 Units by mouth daily after breakfast.       Disposition:  Discharge Instructions    Diet - low sodium heart healthy    Complete by:  As directed   Increase activity slowly    Complete by:  As directed     Follow-up Information    [place] [facility] Follow up on 08/23/2015.   Specialty:  Cardiology Why:  at 11:30AM for wound check  Contact information: [address], [place] [phone]       Eliese Kerwood Meredith Leeds, MD Follow up on 11/13/2015.   Specialty:  Cardiology Why:  at 11:30AM  Contact information: [place] [address] [phone]           Duration of Discharge Encounter: Greater than 30 minutes including physician time.  Signed, Chanetta Marshall, NP 08/11/2015 10:03 AM  I have seen and examined this patient with Chanetta Marshall.  Agree with above, note added to reflect my findings.  On exam, regular rhythm, no murmurs, lungs clear.  Had dual chamber pacemaker placed for complete AV block.  Interrogation shows R waves lower than at implant.  CXR without abnormality.  Donoven Pett discharge to follow up in device clinic in 10 days.    Alegria Dominique M. Jersee Winiarski MD 08/11/2015 1:04 PM

## 2015-08-11 ENCOUNTER — Ambulatory Visit (HOSPITAL_COMMUNITY): Payer: Medicare Other

## 2015-08-11 DIAGNOSIS — I442 Atrioventricular block, complete: Secondary | ICD-10-CM | POA: Diagnosis not present

## 2015-08-11 DIAGNOSIS — R001 Bradycardia, unspecified: Secondary | ICD-10-CM | POA: Diagnosis not present

## 2015-08-11 DIAGNOSIS — I2510 Atherosclerotic heart disease of native coronary artery without angina pectoris: Secondary | ICD-10-CM | POA: Diagnosis not present

## 2015-08-11 DIAGNOSIS — E785 Hyperlipidemia, unspecified: Secondary | ICD-10-CM | POA: Diagnosis not present

## 2015-08-11 DIAGNOSIS — I10 Essential (primary) hypertension: Secondary | ICD-10-CM | POA: Diagnosis not present

## 2015-08-11 DIAGNOSIS — E039 Hypothyroidism, unspecified: Secondary | ICD-10-CM | POA: Diagnosis not present

## 2015-08-11 DIAGNOSIS — Z950 Presence of cardiac pacemaker: Secondary | ICD-10-CM | POA: Diagnosis not present

## 2015-08-11 DIAGNOSIS — I82409 Acute embolism and thrombosis of unspecified deep veins of unspecified lower extremity: Secondary | ICD-10-CM | POA: Diagnosis not present

## 2015-08-11 DIAGNOSIS — K219 Gastro-esophageal reflux disease without esophagitis: Secondary | ICD-10-CM | POA: Diagnosis not present

## 2015-08-11 DIAGNOSIS — C55 Malignant neoplasm of uterus, part unspecified: Secondary | ICD-10-CM | POA: Diagnosis not present

## 2015-08-11 LAB — GLUCOSE, CAPILLARY: Glucose-Capillary: 149 mg/dL — ABNORMAL HIGH (ref 65–99)

## 2015-08-23 ENCOUNTER — Ambulatory Visit (INDEPENDENT_AMBULATORY_CARE_PROVIDER_SITE_OTHER): Payer: Medicare Other | Admitting: *Deleted

## 2015-08-23 ENCOUNTER — Ambulatory Visit
Admission: RE | Admit: 2015-08-23 | Discharge: 2015-08-23 | Disposition: A | Discharge status: Home / Self Care | Payer: Medicare Other | Source: Ambulatory Visit | Attending: Cardiology | Admitting: Cardiology

## 2015-08-23 DIAGNOSIS — I4430 Unspecified atrioventricular block: Secondary | ICD-10-CM

## 2015-08-23 DIAGNOSIS — Z950 Presence of cardiac pacemaker: Secondary | ICD-10-CM | POA: Diagnosis not present

## 2015-08-23 LAB — CUP PACEART INCLINIC DEVICE CHECK
Date Time Interrogation Session: 20170816142002
Implantable Lead Location: 753859
Lead Channel Impedance Value: 512.5 Ohm
Lead Channel Pacing Threshold Amplitude: 0.5 V
Lead Channel Pacing Threshold Pulse Width: 0.5 ms
Lead Channel Sensing Intrinsic Amplitude: 1.4 mV
Lead Channel Sensing Intrinsic Amplitude: 2.4 mV
Lead Channel Setting Pacing Amplitude: 3.5 V
Lead Channel Setting Pacing Pulse Width: 0.5 ms
MDC IDC LEAD IMPLANT DT: 20170803
MDC IDC LEAD IMPLANT DT: 20170803
MDC IDC LEAD LOCATION: 753860
MDC IDC MSMT BATTERY REMAINING LONGEVITY: 122.4
MDC IDC MSMT BATTERY VOLTAGE: 3.01 V
MDC IDC MSMT LEADCHNL RA PACING THRESHOLD AMPLITUDE: 0.5 V
MDC IDC MSMT LEADCHNL RA PACING THRESHOLD PULSEWIDTH: 0.5 ms
MDC IDC MSMT LEADCHNL RV IMPEDANCE VALUE: 487.5 Ohm
MDC IDC SET LEADCHNL RV PACING AMPLITUDE: 0.75 V
MDC IDC SET LEADCHNL RV SENSING SENSITIVITY: 2 mV
MDC IDC STAT BRADY RA PERCENT PACED: 11 %
MDC IDC STAT BRADY RV PERCENT PACED: 99.89 %
Pulse Gen Serial Number: 7929478

## 2015-08-23 NOTE — Progress Notes (Signed)
Wound check appointment. Steri-strips removed. Wound without redness or edema. Incision edges approximated, wound well healed. Normal device function. RA thresholds, sensing, and impedances consistent with implant measurements. RV thresholds consistent, R-waves decreased to 2.54mV from 14.55mV at implant. Reviewed with GT--placed order for CXR, patient and family agreeable. Device programmed at 3.5V (RA) and auto capture programmed on (RV) for extra safety margin until 3 month visit. Histogram distribution appropriate for patient and level of activity. 4 mode switches (<1%), AT, longest 10sec, peak A 210bpm. No high ventricular rates noted. Patient educated about wound care, arm mobility, lifting restrictions. ROV in 3 months with WC.

## 2015-08-24 ENCOUNTER — Telehealth: Payer: Self-pay | Admitting: *Deleted

## 2015-08-24 NOTE — Telephone Encounter (Signed)
Spoke with patient regarding CXR obtained for diminished R-waves at wound check on 08/24/15.  Upon further review of notes from hospital stay, R-waves stable with next day check.  Dr. Curt Bears reviewed CXR and device check and recommended continuing to follow with reassessment at 3 month f/u appointment on 11/13/15.  Patient is agreeable to this plan.  She is appreciative of call and denies additional questions or concerns at this time.

## 2015-08-30 ENCOUNTER — Ambulatory Visit: Payer: Medicare Other | Admitting: Cardiovascular Disease

## 2015-09-01 DIAGNOSIS — L570 Actinic keratosis: Secondary | ICD-10-CM | POA: Diagnosis not present

## 2015-09-01 DIAGNOSIS — Z85828 Personal history of other malignant neoplasm of skin: Secondary | ICD-10-CM | POA: Diagnosis not present

## 2015-09-08 ENCOUNTER — Encounter: Payer: Self-pay | Admitting: Cardiology

## 2015-09-20 DIAGNOSIS — H1851 Endothelial corneal dystrophy: Secondary | ICD-10-CM | POA: Diagnosis not present

## 2015-09-20 DIAGNOSIS — E119 Type 2 diabetes mellitus without complications: Secondary | ICD-10-CM | POA: Diagnosis not present

## 2015-09-20 DIAGNOSIS — H04123 Dry eye syndrome of bilateral lacrimal glands: Secondary | ICD-10-CM | POA: Diagnosis not present

## 2015-10-23 DIAGNOSIS — H18891 Other specified disorders of cornea, right eye: Secondary | ICD-10-CM | POA: Diagnosis not present

## 2015-10-23 DIAGNOSIS — H04123 Dry eye syndrome of bilateral lacrimal glands: Secondary | ICD-10-CM | POA: Diagnosis not present

## 2015-10-23 DIAGNOSIS — D3131 Benign neoplasm of right choroid: Secondary | ICD-10-CM | POA: Diagnosis not present

## 2015-10-23 DIAGNOSIS — Z947 Corneal transplant status: Secondary | ICD-10-CM | POA: Diagnosis not present

## 2015-10-23 DIAGNOSIS — H1851 Endothelial corneal dystrophy: Secondary | ICD-10-CM | POA: Diagnosis not present

## 2015-10-23 DIAGNOSIS — Z9889 Other specified postprocedural states: Secondary | ICD-10-CM | POA: Diagnosis not present

## 2015-10-24 DIAGNOSIS — Z950 Presence of cardiac pacemaker: Secondary | ICD-10-CM | POA: Insufficient documentation

## 2015-10-24 DIAGNOSIS — I119 Hypertensive heart disease without heart failure: Secondary | ICD-10-CM | POA: Diagnosis not present

## 2015-10-24 DIAGNOSIS — F325 Major depressive disorder, single episode, in full remission: Secondary | ICD-10-CM | POA: Diagnosis not present

## 2015-10-24 DIAGNOSIS — E668 Other obesity: Secondary | ICD-10-CM | POA: Diagnosis not present

## 2015-10-24 DIAGNOSIS — I2510 Atherosclerotic heart disease of native coronary artery without angina pectoris: Secondary | ICD-10-CM | POA: Diagnosis not present

## 2015-10-24 DIAGNOSIS — Z6831 Body mass index (BMI) 31.0-31.9, adult: Secondary | ICD-10-CM | POA: Diagnosis not present

## 2015-10-24 DIAGNOSIS — R413 Other amnesia: Secondary | ICD-10-CM | POA: Diagnosis not present

## 2015-10-24 DIAGNOSIS — E038 Other specified hypothyroidism: Secondary | ICD-10-CM | POA: Diagnosis not present

## 2015-10-24 DIAGNOSIS — Z23 Encounter for immunization: Secondary | ICD-10-CM | POA: Diagnosis not present

## 2015-10-24 DIAGNOSIS — E119 Type 2 diabetes mellitus without complications: Secondary | ICD-10-CM | POA: Diagnosis not present

## 2015-10-30 DIAGNOSIS — H1851 Endothelial corneal dystrophy: Secondary | ICD-10-CM | POA: Diagnosis not present

## 2015-10-30 DIAGNOSIS — H04123 Dry eye syndrome of bilateral lacrimal glands: Secondary | ICD-10-CM | POA: Diagnosis not present

## 2015-10-30 DIAGNOSIS — Z947 Corneal transplant status: Secondary | ICD-10-CM | POA: Diagnosis not present

## 2015-10-30 DIAGNOSIS — D3131 Benign neoplasm of right choroid: Secondary | ICD-10-CM | POA: Diagnosis not present

## 2015-10-30 DIAGNOSIS — E119 Type 2 diabetes mellitus without complications: Secondary | ICD-10-CM | POA: Diagnosis not present

## 2015-10-30 DIAGNOSIS — H18899 Other specified disorders of cornea, unspecified eye: Secondary | ICD-10-CM | POA: Diagnosis not present

## 2015-11-01 ENCOUNTER — Encounter: Payer: Self-pay | Admitting: Cardiology

## 2015-11-06 DIAGNOSIS — L700 Acne vulgaris: Secondary | ICD-10-CM | POA: Diagnosis not present

## 2015-11-06 DIAGNOSIS — Z85828 Personal history of other malignant neoplasm of skin: Secondary | ICD-10-CM | POA: Diagnosis not present

## 2015-11-13 ENCOUNTER — Encounter: Payer: Medicare Other | Admitting: Cardiology

## 2015-11-15 NOTE — Progress Notes (Signed)
Electrophysiology Office Note   Date:  11/16/2015   ID:  Diana Wood, Diana Wood 10/20/1932, MRN [id number]  PCP:  Precious Reel, MD  Cardiologist:  Johnsie Cancel Primary Electrophysiologist:  Dawnetta Copenhaver Meredith Leeds, MD    Chief Complaint  Patient presents with  . Pacemaker Check    Heart Block     History of Present Illness: Diana Wood is a 82 y.o. female who presents today for electrophysiology evaluation.   History of CAD s/p CABG 11/2006, h/o atypical chest pain felt due to neuropathy/sternotomy, HTN, HLD, uterine cancer, hypothyroidism, DVT, GERD, anemia. Had pacemaker placed 08/10/15 for complete AV block.   Today, she denies symptoms of palpitations, chest pain, shortness of breath, orthopnea, PND, lower extremity edema, claudication, dizziness, presyncope, syncope, bleeding, or neurologic sequela. The patient is tolerating medications without difficulties and is otherwise without complaint today.  She has been sleeping much more lately, and this dates to prior to her pacemaker placement. She says that when she wakes up from her, she feels much more rested. Her primary physician is told her that she needs to exercise more.   Past Medical History:  Diagnosis Date  . Anemia   . CAD (coronary artery disease)    a. s/p CABG 2008.  [name] Chest pain   . Deep vein thrombosis ([place])   . Diabetes mellitus   . GERD (gastroesophageal reflux disease)   . Hiatal hernia   . Hypercholesterolemia   . Hyperlipidemia   . Hypertension   . Hypothyroid   . Obesity   . Pleural effusion   . Uterine cancer [facility])    Past Surgical History:  Procedure Laterality Date  . ABDOMINAL HYSTERECTOMY    . bilateral feet surgery    . bilateral knee surgeries    . CATARACT EXTRACTION, BILATERAL    . CORONARY ARTERY BYPASS GRAFT     x 4-11'08-Dr. Bartle([facility])  . endoscopic vein harvesting from both thighs     Gilford Raid MD  . EP IMPLANTABLE DEVICE N/A 08/10/2015   Procedure: Pacemaker Implant;  Surgeon: Kendi Defalco  Meredith Leeds, MD;  Location: [facility];  Service: Cardiovascular;  Laterality: N/A;  . excision cyst debridement fusion distal interphalangeal joint ring finger  with Mini Acutrak screw 20 mm in length    . EYE SURGERY     corneal transplant -right  . hysterectomy  2000   dx of uterine cancer  . JOINT REPLACEMENT     right knee replacement-Dr. supple  . surgery on 2 fingers     r HAND  . TOTAL KNEE ARTHROPLASTY Left 03/16/2012   Procedure: TOTAL KNEE ARTHROPLASTY;  Surgeon: Gearlean Alf, MD;  Location: [facility];  Service: Orthopedics;  Laterality: Left;     Current Outpatient Prescriptions  Medication Sig Dispense Refill  . amLODipine (NORVASC) 10 MG tablet Take 10 mg by mouth daily. At 1500    . Cholecalciferol (VITAMIN D PO) Take 2,000 Units by mouth daily after breakfast.     . fenofibrate (TRICOR) 145 MG tablet Take 145 mg by mouth daily after breakfast.     . hydrALAZINE (APRESOLINE) 25 MG tablet Take 25 mg by mouth 3 (three) times daily. After breakfast, at 1500 and at bedtime    . hydrochlorothiazide (HYDRODIURIL) 12.5 MG tablet Take 12.5 mg by mouth daily after breakfast.     . levothyroxine (SYNTHROID, LEVOTHROID) 125 MCG tablet Take 125 mcg by mouth daily before breakfast.     . metFORMIN (GLUMETZA) 500 MG (  MOD) 24 hr tablet Take 1,000 mg by mouth daily before breakfast.     . mupirocin ointment (BACTROBAN) 2 % Apply 1 application topically 2 (two) times daily.    [name] omeprazole (PRILOSEC) 20 MG capsule Take 20 mg by mouth daily after breakfast.     . prednisoLONE acetate (PRED FORTE) 1 % ophthalmic suspension Place 1 drop into both eyes at bedtime.     . sertraline (ZOLOFT) 25 MG tablet Take 25 mg by mouth daily after breakfast.      No current facility-administered medications for this visit.     Allergies:   Losartan and Tape   Social History:  The patient  reports that she has never smoked. She has never used smokeless tobacco. She reports that she does not  drink alcohol or use drugs.   Family History:  The patient's family history includes Blindness in her brother; Cancer in her mother; Diabetes in her mother.    ROS:  Please see the history of present illness.   Otherwise, review of systems is positive for cough, balance problems.   All other systems are reviewed and negative.    PHYSICAL EXAM: VS:  BP 138/78   Pulse 67   Ht 5\' 1"  (1.549 m)   Wt 161 lb (73 kg)   BMI 30.42 kg/m  , BMI Body mass index is 30.42 kg/m. GEN: Well nourished, well developed, in no acute distress  HEENT: normal  Neck: no JVD, carotid bruits, or masses Cardiac: RRR; no murmurs, rubs, or gallops,no edema  Respiratory:  clear to auscultation bilaterally, normal work of breathing GI: soft, nontender, nondistended, + BS MS: no deformity or atrophy  Skin: warm and dry,  device pocket is well healed Neuro:  Strength and sensation are intact Psych: euthymic mood, full affect  EKG:  EKG is ordered today. Personal review of the ekg ordered shows sinus rhythm, V pacing   Device interrogation is reviewed today in detail.  See PaceArt for details.   Recent Labs: 08/04/2015: BUN 18; Creat 0.88; Hemoglobin 11.6; Magnesium 1.6; Platelets 301; Potassium 4.2; Sodium 139; TSH 0.07    Lipid Panel     Component Value Date/Time   CHOL  11/14/2006 0110    145        ATP III CLASSIFICATION:  <200     mg/dL   Desirable  200-239  mg/dL   Borderline High  >=240    mg/dL   High   TRIG 118 11/14/2006 0110   HDL 38 (L) 11/14/2006 0110   CHOLHDL 3.8 11/14/2006 0110   VLDL 24 11/14/2006 0110   LDLCALC  11/14/2006 0110    83        Total Cholesterol/HDL:CHD Risk Coronary Heart Disease Risk Table                     Men   Women  1/2 Average Risk   3.4   3.3     Wt Readings from Last 3 Encounters:  11/16/15 161 lb (73 kg)  08/11/15 158 lb 8 oz (71.9 kg)  08/09/15 164 lb (74.4 kg)      Other studies Reviewed: Additional studies/ records that were reviewed today  include: Epic notes  ASSESSMENT AND PLAN:  1.  Complete AV block: s/p dual chamber pacemaker. Pacemaker currently functioning appropriately. She has had some episodes of atrial tachycardia with conduction, but otherwise she is in complete heart block. We'll continue her on her current medications as she is  not symptomatic from her episodes of tachycardia.  2. HTN: well controlled today  3. CAD: no current chest pain    Current medicines are reviewed at length with the patient today.   The patient does not have concerns regarding her medicines.  The following changes were made today:  none  Labs/ tests ordered today include:  No orders of the defined types were placed in this encounter.    Disposition:   FU with Diana Wood 9 months  Signed, Clydette Privitera Meredith Leeds, MD  11/16/2015 11:40 AM     [facility] [address] [place]   [address] [phone] (office) [phone] (fax) [

## 2015-11-16 ENCOUNTER — Encounter: Payer: Self-pay | Admitting: Cardiology

## 2015-11-16 ENCOUNTER — Ambulatory Visit (INDEPENDENT_AMBULATORY_CARE_PROVIDER_SITE_OTHER): Payer: Medicare Other | Admitting: Cardiology

## 2015-11-16 VITALS — BP 138/78 | HR 67 | Ht 61.0 in | Wt 161.0 lb

## 2015-11-16 DIAGNOSIS — Z950 Presence of cardiac pacemaker: Secondary | ICD-10-CM

## 2015-11-16 DIAGNOSIS — I459 Conduction disorder, unspecified: Secondary | ICD-10-CM

## 2015-11-16 DIAGNOSIS — I2510 Atherosclerotic heart disease of native coronary artery without angina pectoris: Secondary | ICD-10-CM | POA: Diagnosis not present

## 2015-11-16 NOTE — Patient Instructions (Signed)
Medication Instructions:    Your physician recommends that you continue on your current medications as directed. Please refer to the Current Medication list given to you today.  --- If you need a refill on your cardiac medications before your next appointment, please call your pharmacy. ---  Labwork:  None ordered  Testing/Procedures:  None ordered  Follow-Up: Remote monitoring is used to monitor your Pacemaker of ICD from home. This monitoring reduces the number of office visits required to check your device to one time per year. It allows [place] to keep an eye on the functioning of your device to ensure it is working properly. You are scheduled for a device check from home on 02/15/2016. You may send your transmission at any time that day. If you have a wireless device, the transmission will be sent automatically. After your physician reviews your transmission, you will receive a postcard with your next transmission date.   Your physician wants you to follow-up in: 9 months with Dr. Curt Bears.  You will receive a reminder letter in the mail two months in advance. If you don't receive a letter, please call our office to schedule the follow-up appointment.   Thank you for choosing [facility]!!   Trinidad Curet, RN [phone]

## 2015-11-17 DIAGNOSIS — Z471 Aftercare following joint replacement surgery: Secondary | ICD-10-CM | POA: Diagnosis not present

## 2015-11-17 DIAGNOSIS — Z96653 Presence of artificial knee joint, bilateral: Secondary | ICD-10-CM | POA: Diagnosis not present

## 2015-11-17 LAB — CUP PACEART INCLINIC DEVICE CHECK
Brady Statistic RV Percent Paced: 99.92 %
Implantable Lead Implant Date: 20170803
Implantable Lead Implant Date: 20170803
Implantable Lead Location: 753859
Lead Channel Impedance Value: 450 Ohm
Lead Channel Pacing Threshold Amplitude: 0.5 V
Lead Channel Sensing Intrinsic Amplitude: 1.4 mV
Lead Channel Sensing Intrinsic Amplitude: 4.3 mV
Lead Channel Setting Pacing Amplitude: 0.75 V
Lead Channel Setting Pacing Amplitude: 2 V
MDC IDC LEAD LOCATION: 753860
MDC IDC MSMT BATTERY REMAINING LONGEVITY: 126 mo
MDC IDC MSMT BATTERY VOLTAGE: 3.02 V
MDC IDC MSMT LEADCHNL RA PACING THRESHOLD AMPLITUDE: 0.5 V
MDC IDC MSMT LEADCHNL RA PACING THRESHOLD PULSEWIDTH: 0.5 ms
MDC IDC MSMT LEADCHNL RV IMPEDANCE VALUE: 487.5 Ohm
MDC IDC MSMT LEADCHNL RV PACING THRESHOLD PULSEWIDTH: 0.5 ms
MDC IDC PG IMPLANT DT: 20170803
MDC IDC SESS DTM: 20171109173827
MDC IDC SET LEADCHNL RV PACING PULSEWIDTH: 0.5 ms
MDC IDC SET LEADCHNL RV SENSING SENSITIVITY: 2 mV
MDC IDC STAT BRADY RA PERCENT PACED: 5.6 %
Pulse Gen Model: 2272
Pulse Gen Serial Number: 7929478

## 2015-12-08 DIAGNOSIS — L814 Other melanin hyperpigmentation: Secondary | ICD-10-CM | POA: Diagnosis not present

## 2015-12-08 DIAGNOSIS — L821 Other seborrheic keratosis: Secondary | ICD-10-CM | POA: Diagnosis not present

## 2015-12-08 DIAGNOSIS — L570 Actinic keratosis: Secondary | ICD-10-CM | POA: Diagnosis not present

## 2015-12-08 DIAGNOSIS — D1801 Hemangioma of skin and subcutaneous tissue: Secondary | ICD-10-CM | POA: Diagnosis not present

## 2015-12-08 DIAGNOSIS — B351 Tinea unguium: Secondary | ICD-10-CM | POA: Diagnosis not present

## 2015-12-08 DIAGNOSIS — Z85828 Personal history of other malignant neoplasm of skin: Secondary | ICD-10-CM | POA: Diagnosis not present

## 2015-12-08 DIAGNOSIS — D235 Other benign neoplasm of skin of trunk: Secondary | ICD-10-CM | POA: Diagnosis not present

## 2015-12-28 DIAGNOSIS — H04123 Dry eye syndrome of bilateral lacrimal glands: Secondary | ICD-10-CM | POA: Diagnosis not present

## 2015-12-28 DIAGNOSIS — H1851 Endothelial corneal dystrophy: Secondary | ICD-10-CM | POA: Diagnosis not present

## 2015-12-28 DIAGNOSIS — E119 Type 2 diabetes mellitus without complications: Secondary | ICD-10-CM | POA: Diagnosis not present

## 2016-01-18 DIAGNOSIS — E038 Other specified hypothyroidism: Secondary | ICD-10-CM | POA: Diagnosis not present

## 2016-01-18 DIAGNOSIS — R413 Other amnesia: Secondary | ICD-10-CM | POA: Diagnosis not present

## 2016-01-18 DIAGNOSIS — E668 Other obesity: Secondary | ICD-10-CM | POA: Diagnosis not present

## 2016-01-18 DIAGNOSIS — I10 Essential (primary) hypertension: Secondary | ICD-10-CM | POA: Diagnosis not present

## 2016-01-18 DIAGNOSIS — F325 Major depressive disorder, single episode, in full remission: Secondary | ICD-10-CM | POA: Diagnosis not present

## 2016-01-18 DIAGNOSIS — E119 Type 2 diabetes mellitus without complications: Secondary | ICD-10-CM | POA: Diagnosis not present

## 2016-01-18 DIAGNOSIS — I517 Cardiomegaly: Secondary | ICD-10-CM | POA: Diagnosis not present

## 2016-01-18 DIAGNOSIS — Z6830 Body mass index (BMI) 30.0-30.9, adult: Secondary | ICD-10-CM | POA: Diagnosis not present

## 2016-01-18 DIAGNOSIS — I119 Hypertensive heart disease without heart failure: Secondary | ICD-10-CM | POA: Diagnosis not present

## 2016-01-18 DIAGNOSIS — Z950 Presence of cardiac pacemaker: Secondary | ICD-10-CM | POA: Diagnosis not present

## 2016-02-02 NOTE — Progress Notes (Signed)
Cardiology Office Note    Date:  02/14/2016  ID:  Jontia, Prins 09/09/1932, MRN [id number] PCP:  Precious Reel, MD  Cardiologist:  Dr. Johnsie Cancel    Chief Complaint: None f/u CAD and AV block   History of Present Illness:  Diana Wood is a 83 y.o. female with history of CAD s/p CABG 11/2006, h/o atypical chest pain felt due to neuropathy/sternotomy, HTN, HLD, uterine cancer, hypothyroidism, DVT, GERD, anemia who presents for f/uH H/o normal nuc 2009, prior EF 79%. Seen by PA 08/05/15 and had bradycardia and AV block  .08/10/15 STJ MRI compatible dual chamber PPM placed by Dr Curt Bears F/U 11/16/15 normal pacer function   Note had suppressed TSH before pacer  Needs to be rechecked Dr Virgina Jock apparently held synthroid on 3 successive Saturday's   Having some exertional chest pain like previous angina Has not needed nitro    Past Medical History:  Diagnosis Date  . Anemia   . CAD (coronary artery disease)    a. s/p CABG 2008.  [name] Chest pain   . Deep vein thrombosis ([place])   . Diabetes mellitus   . GERD (gastroesophageal reflux disease)   . Hiatal hernia   . Hypercholesterolemia   . Hyperlipidemia   . Hypertension   . Hypothyroid   . Obesity   . Pleural effusion   . Uterine cancer [facility])     Past Surgical History:  Procedure Laterality Date  . ABDOMINAL HYSTERECTOMY    . bilateral feet surgery    . bilateral knee surgeries    . CATARACT EXTRACTION, BILATERAL    . CORONARY ARTERY BYPASS GRAFT     x 4-11'08-Dr. Bartle([facility])  . endoscopic vein harvesting from both thighs     Gilford Raid MD  . EP IMPLANTABLE DEVICE N/A 08/10/2015   Procedure: Pacemaker Implant;  Surgeon: Will Meredith Leeds, MD;  Location: [facility];  Service: Cardiovascular;  Laterality: N/A;  . excision cyst debridement fusion distal interphalangeal joint ring finger  with Mini Acutrak screw 20 mm in length    . EYE SURGERY     corneal transplant -right  . hysterectomy  2000   dx of uterine cancer    . JOINT REPLACEMENT     right knee replacement-Dr. supple  . surgery on 2 fingers     r HAND  . TOTAL KNEE ARTHROPLASTY Left 03/16/2012   Procedure: TOTAL KNEE ARTHROPLASTY;  Surgeon: Gearlean Alf, MD;  Location: [facility];  Service: Orthopedics;  Laterality: Left;    Current Medications: Current Outpatient Prescriptions  Medication Sig Dispense Refill  . amLODipine (NORVASC) 10 MG tablet Take 10 mg by mouth daily. At 1500    . Cholecalciferol (VITAMIN D PO) Take 2,000 Units by mouth daily after breakfast.     . fenofibrate (TRICOR) 145 MG tablet Take 145 mg by mouth daily after breakfast.     . hydrALAZINE (APRESOLINE) 25 MG tablet Take 25 mg by mouth 3 (three) times daily. After breakfast, at 1500 and at bedtime    . hydrochlorothiazide (HYDRODIURIL) 12.5 MG tablet Take 12.5 mg by mouth daily after breakfast.     . levothyroxine (SYNTHROID, LEVOTHROID) 125 MCG tablet Take 125 mcg by mouth daily before breakfast.     . metFORMIN (GLUMETZA) 500 MG (MOD) 24 hr tablet Take 1,000 mg by mouth daily before breakfast.     . mupirocin ointment (BACTROBAN) 2 % Apply 1 application topically 2 (two) times daily.    [name]  Omega-3 Fatty Acids (FISH OIL PO) Take 1 tablet by mouth daily.    [name] omeprazole (PRILOSEC) 20 MG capsule Take 20 mg by mouth daily after breakfast.     . prednisoLONE acetate (PRED FORTE) 1 % ophthalmic suspension Place 1 drop into both eyes at bedtime.     . sertraline (ZOLOFT) 25 MG tablet Take 25 mg by mouth daily after breakfast.      No current facility-administered medications for this visit.      Allergies:   Losartan and Tape   Social History   Social History  . Marital status: Married    Spouse name: N/A  . Number of children: 3  . Years of education: N/A   Occupational History  . retired Retired    Network engineer   Social History Main Topics  . Smoking status: Never Smoker  . Smokeless tobacco: Never Used  . Alcohol use No  . Drug use: No  . Sexual activity: Yes    Other Topics Concern  . None   Social History Narrative  . None     Family History:  The patient's family history includes Blindness in her brother; Cancer in her mother; Diabetes in her mother.   ROS:   Please see the history of present illness.  All other systems are reviewed and otherwise negative.    PHYSICAL EXAM:   VS:  BP 122/80   Pulse 72   Ht 5\' 1"  (1.549 m)   Wt 160 lb 12.8 oz (72.9 kg)   SpO2 98%   BMI 30.38 kg/m   BMI: Body mass index is 30.38 kg/m. GEN: Well nourished, well developed WF, in no acute distress - appears younger than stated age 33: normocephalic, atraumatic Neck: no JVD, carotid bruits, or masses Cardiac:no murmur pacer under right clavicle  Respiratory:  clear to auscultation bilaterally, normal work of breathing GI: soft, nontender, nondistended, + BS MS: no deformity or atrophy  Skin: warm and dry, no rash Neuro:  Alert and Oriented x 3, Strength and sensation are intact, follows commands Psych: euthymic mood, full affect  Wt Readings from Last 3 Encounters:  02/14/16 160 lb 12.8 oz (72.9 kg)  11/16/15 161 lb (73 kg)  08/11/15 158 lb 8 oz (71.9 kg)      Studies/Labs Reviewed:   EKG:   08/04/15 CHB narrow complex escape 02/14/16  P synch pacing LBBB   Recent Labs: 08/04/2015: BUN 18; Creat 0.88; Hemoglobin 11.6; Magnesium 1.6; Platelets 301; Potassium 4.2; Sodium 139; TSH 0.07   Lipid Panel   Additional studies/ records that were reviewed today include: Summarized above.    ASSESSMENT & PLAN:   1. PPM:  Pocket looks fine not pacer dependant normal function f/u EP Camnitz 2. CAD s/p CABG - chest pain f/u lexiscan myovue given pacer  3. Essential HTN - being actively managed and followed by primary care.   4. Chol:  Labs followed by Virgina Jock on fibrate not sure why not on statin 5. Thyroid:  Suppressed before her pacer placement  F/u labs today  Lab Results  Component Value Date   TSH 0.07 (L) 08/04/2015      Jenkins Rouge

## 2016-02-05 DIAGNOSIS — H04123 Dry eye syndrome of bilateral lacrimal glands: Secondary | ICD-10-CM | POA: Diagnosis not present

## 2016-02-05 DIAGNOSIS — Z947 Corneal transplant status: Secondary | ICD-10-CM | POA: Diagnosis not present

## 2016-02-05 DIAGNOSIS — D3131 Benign neoplasm of right choroid: Secondary | ICD-10-CM | POA: Diagnosis not present

## 2016-02-05 DIAGNOSIS — Z9889 Other specified postprocedural states: Secondary | ICD-10-CM | POA: Diagnosis not present

## 2016-02-05 DIAGNOSIS — H11823 Conjunctivochalasis, bilateral: Secondary | ICD-10-CM | POA: Diagnosis not present

## 2016-02-05 DIAGNOSIS — E119 Type 2 diabetes mellitus without complications: Secondary | ICD-10-CM | POA: Diagnosis not present

## 2016-02-06 DIAGNOSIS — L905 Scar conditions and fibrosis of skin: Secondary | ICD-10-CM | POA: Diagnosis not present

## 2016-02-06 DIAGNOSIS — L570 Actinic keratosis: Secondary | ICD-10-CM | POA: Diagnosis not present

## 2016-02-06 DIAGNOSIS — Z85828 Personal history of other malignant neoplasm of skin: Secondary | ICD-10-CM | POA: Diagnosis not present

## 2016-02-06 DIAGNOSIS — L820 Inflamed seborrheic keratosis: Secondary | ICD-10-CM | POA: Diagnosis not present

## 2016-02-14 ENCOUNTER — Encounter: Payer: Self-pay | Admitting: Cardiovascular Disease

## 2016-02-14 ENCOUNTER — Ambulatory Visit (INDEPENDENT_AMBULATORY_CARE_PROVIDER_SITE_OTHER): Payer: Medicare Other | Admitting: Cardiovascular Disease

## 2016-02-14 VITALS — BP 122/80 | HR 72 | Ht 61.0 in | Wt 160.8 lb

## 2016-02-14 DIAGNOSIS — R079 Chest pain, unspecified: Secondary | ICD-10-CM

## 2016-02-14 DIAGNOSIS — I10 Essential (primary) hypertension: Secondary | ICD-10-CM | POA: Diagnosis not present

## 2016-02-14 NOTE — Patient Instructions (Addendum)
Medication Instructions:  Your physician recommends that you continue on your current medications as directed. Please refer to the Current Medication list given to you today.  Labwork: Your physician recommends that you have lab work today- TSH and T4  Testing/Procedures: Your physician has requested that you have a lexiscan myoview. For further information please visit [email or web address]. Please follow instruction sheet, as given.  Follow-Up: Your physician wants you to follow-up in: 6 months with Dr. Johnsie Cancel. You will receive a reminder letter in the mail two months in advance. If you don't receive a letter, please call our office to schedule the follow-up appointment.   If you need a refill on your cardiac medications before your next appointment, please call your pharmacy.

## 2016-02-14 NOTE — Addendum Note (Signed)
Addended by: Josian Lanese J on: 02/14/2016 04:30 PM   Modules accepted: Orders  

## 2016-02-15 ENCOUNTER — Ambulatory Visit (INDEPENDENT_AMBULATORY_CARE_PROVIDER_SITE_OTHER): Payer: Medicare Other | Admitting: *Deleted

## 2016-02-15 DIAGNOSIS — I459 Conduction disorder, unspecified: Secondary | ICD-10-CM

## 2016-02-15 LAB — T4, FREE: FREE T4: 2.05 ng/dL — AB (ref 0.82–1.77)

## 2016-02-15 LAB — TSH: TSH: 0.091 u[IU]/mL — AB (ref 0.450–4.500)

## 2016-02-15 NOTE — Progress Notes (Signed)
Remote pacemaker transmission.   

## 2016-02-16 ENCOUNTER — Encounter: Payer: Self-pay | Admitting: Cardiology

## 2016-02-20 ENCOUNTER — Telehealth (HOSPITAL_COMMUNITY): Payer: Self-pay | Admitting: *Deleted

## 2016-02-20 LAB — CUP PACEART REMOTE DEVICE CHECK
Battery Remaining Longevity: 121 mo
Battery Remaining Percentage: 95.5 %
Brady Statistic AP VS Percent: 1 %
Brady Statistic AS VS Percent: 1 %
Brady Statistic RV Percent Paced: 99 %
Implantable Lead Implant Date: 20170803
Implantable Lead Location: 753859
Implantable Lead Location: 753860
Lead Channel Impedance Value: 490 Ohm
Lead Channel Pacing Threshold Amplitude: 0.5 V
Lead Channel Pacing Threshold Amplitude: 0.5 V
Lead Channel Pacing Threshold Pulse Width: 0.5 ms
Lead Channel Sensing Intrinsic Amplitude: 2.1 mV
Lead Channel Setting Pacing Pulse Width: 0.5 ms
Lead Channel Setting Sensing Sensitivity: 2 mV
MDC IDC LEAD IMPLANT DT: 20170803
MDC IDC MSMT BATTERY VOLTAGE: 3.02 V
MDC IDC MSMT LEADCHNL RV IMPEDANCE VALUE: 460 Ohm
MDC IDC MSMT LEADCHNL RV PACING THRESHOLD PULSEWIDTH: 0.5 ms
MDC IDC MSMT LEADCHNL RV SENSING INTR AMPL: 10.8 mV
MDC IDC PG IMPLANT DT: 20170803
MDC IDC PG SERIAL: 7929478
MDC IDC SESS DTM: 20180208070022
MDC IDC SET LEADCHNL RA PACING AMPLITUDE: 2 V
MDC IDC SET LEADCHNL RV PACING AMPLITUDE: 0.75 V
MDC IDC STAT BRADY AP VP PERCENT: 11 %
MDC IDC STAT BRADY AS VP PERCENT: 89 %
MDC IDC STAT BRADY RA PERCENT PACED: 11 %

## 2016-02-20 NOTE — Telephone Encounter (Signed)
Patient given detailed instructions per Myocardial Perfusion Study Information Sheet for the test on 02/22/16 at 0945. Patient notified to arrive 15 minutes early and that it is imperative to arrive on time for appointment to keep from having the test rescheduled.  If you need to cancel or reschedule your appointment, please call the office within 24 hours of your appointment. Failure to do so may result in a cancellation of your appointment, and a $50 no show fee. Patient verbalized understanding.Luman Holway, Ranae Palms

## 2016-02-22 ENCOUNTER — Ambulatory Visit (HOSPITAL_COMMUNITY): Payer: Medicare Other | Attending: Cardiovascular Disease

## 2016-02-22 DIAGNOSIS — I10 Essential (primary) hypertension: Secondary | ICD-10-CM | POA: Insufficient documentation

## 2016-02-22 DIAGNOSIS — R079 Chest pain, unspecified: Secondary | ICD-10-CM | POA: Diagnosis not present

## 2016-02-22 LAB — MYOCARDIAL PERFUSION IMAGING
CHL CUP NUCLEAR SDS: 0
CHL CUP NUCLEAR SRS: 5
CHL CUP RESTING HR STRESS: 61 {beats}/min
CSEPPHR: 82 {beats}/min
LV dias vol: 79 mL (ref 46–106)
LVSYSVOL: 27 mL
NUC STRESS TID: 0.95
RATE: 0.35
SSS: 5

## 2016-02-22 MED ORDER — REGADENOSON 0.4 MG/5ML IV SOLN
0.4000 mg | Freq: Once | INTRAVENOUS | Status: AC
  Administered 2016-02-22: 0.4 mg via INTRAVENOUS

## 2016-02-22 MED ORDER — TECHNETIUM TC 99M TETROFOSMIN IV KIT
32.7000 | PACK | Freq: Once | INTRAVENOUS | Status: AC | PRN
  Administered 2016-02-22: 32.7 via INTRAVENOUS
  Filled 2016-02-22: qty 33

## 2016-02-22 MED ORDER — TECHNETIUM TC 99M TETROFOSMIN IV KIT
10.3000 | PACK | Freq: Once | INTRAVENOUS | Status: AC | PRN
  Administered 2016-02-22: 10.3 via INTRAVENOUS
  Filled 2016-02-22: qty 11

## 2016-04-25 DIAGNOSIS — E038 Other specified hypothyroidism: Secondary | ICD-10-CM | POA: Diagnosis not present

## 2016-05-16 ENCOUNTER — Ambulatory Visit (INDEPENDENT_AMBULATORY_CARE_PROVIDER_SITE_OTHER): Payer: Medicare Other | Admitting: *Deleted

## 2016-05-16 DIAGNOSIS — I459 Conduction disorder, unspecified: Secondary | ICD-10-CM

## 2016-05-16 NOTE — Progress Notes (Signed)
Remote pacemaker transmission.   

## 2016-05-17 LAB — CUP PACEART REMOTE DEVICE CHECK
Battery Remaining Longevity: 116 mo
Battery Remaining Percentage: 95.5 %
Brady Statistic AP VS Percent: 1 %
Brady Statistic AS VP Percent: 88 %
Brady Statistic AS VS Percent: 1 %
Date Time Interrogation Session: 20180510083710
Implantable Lead Implant Date: 20170803
Implantable Lead Implant Date: 20170803
Implantable Lead Location: 753859
Implantable Pulse Generator Implant Date: 20170803
Lead Channel Pacing Threshold Amplitude: 0.625 V
Lead Channel Pacing Threshold Pulse Width: 0.5 ms
Lead Channel Sensing Intrinsic Amplitude: 2.6 mV
Lead Channel Sensing Intrinsic Amplitude: 7.8 mV
Lead Channel Setting Pacing Amplitude: 0.875
Lead Channel Setting Pacing Amplitude: 2 V
MDC IDC LEAD LOCATION: 753860
MDC IDC MSMT BATTERY VOLTAGE: 3.01 V
MDC IDC MSMT LEADCHNL RA IMPEDANCE VALUE: 450 Ohm
MDC IDC MSMT LEADCHNL RA PACING THRESHOLD AMPLITUDE: 0.5 V
MDC IDC MSMT LEADCHNL RA PACING THRESHOLD PULSEWIDTH: 0.5 ms
MDC IDC MSMT LEADCHNL RV IMPEDANCE VALUE: 430 Ohm
MDC IDC PG SERIAL: 7929478
MDC IDC SET LEADCHNL RV PACING PULSEWIDTH: 0.5 ms
MDC IDC SET LEADCHNL RV SENSING SENSITIVITY: 2 mV
MDC IDC STAT BRADY AP VP PERCENT: 12 %
MDC IDC STAT BRADY RA PERCENT PACED: 12 %
MDC IDC STAT BRADY RV PERCENT PACED: 99 %
Pulse Gen Model: 2272

## 2016-05-22 ENCOUNTER — Encounter: Payer: Self-pay | Admitting: Gynecology

## 2016-06-21 DIAGNOSIS — E038 Other specified hypothyroidism: Secondary | ICD-10-CM | POA: Diagnosis not present

## 2016-06-21 DIAGNOSIS — E784 Other hyperlipidemia: Secondary | ICD-10-CM | POA: Diagnosis not present

## 2016-06-21 DIAGNOSIS — E119 Type 2 diabetes mellitus without complications: Secondary | ICD-10-CM | POA: Diagnosis not present

## 2016-07-05 DIAGNOSIS — Z947 Corneal transplant status: Secondary | ICD-10-CM | POA: Diagnosis not present

## 2016-07-05 DIAGNOSIS — Z1389 Encounter for screening for other disorder: Secondary | ICD-10-CM | POA: Diagnosis not present

## 2016-07-05 DIAGNOSIS — Z0000 Encounter for general adult medical examination without abnormal findings: Secondary | ICD-10-CM | POA: Diagnosis not present

## 2016-07-05 DIAGNOSIS — I517 Cardiomegaly: Secondary | ICD-10-CM | POA: Diagnosis not present

## 2016-07-05 DIAGNOSIS — Z950 Presence of cardiac pacemaker: Secondary | ICD-10-CM | POA: Diagnosis not present

## 2016-07-05 DIAGNOSIS — E119 Type 2 diabetes mellitus without complications: Secondary | ICD-10-CM | POA: Diagnosis not present

## 2016-07-05 DIAGNOSIS — R413 Other amnesia: Secondary | ICD-10-CM | POA: Diagnosis not present

## 2016-07-05 DIAGNOSIS — E784 Other hyperlipidemia: Secondary | ICD-10-CM | POA: Diagnosis not present

## 2016-07-05 DIAGNOSIS — I119 Hypertensive heart disease without heart failure: Secondary | ICD-10-CM | POA: Diagnosis not present

## 2016-07-05 DIAGNOSIS — I700 Atherosclerosis of aorta: Secondary | ICD-10-CM | POA: Diagnosis not present

## 2016-07-05 DIAGNOSIS — Z6830 Body mass index (BMI) 30.0-30.9, adult: Secondary | ICD-10-CM | POA: Diagnosis not present

## 2016-07-05 DIAGNOSIS — I2510 Atherosclerotic heart disease of native coronary artery without angina pectoris: Secondary | ICD-10-CM | POA: Diagnosis not present

## 2016-07-29 DIAGNOSIS — H04123 Dry eye syndrome of bilateral lacrimal glands: Secondary | ICD-10-CM | POA: Diagnosis not present

## 2016-07-29 DIAGNOSIS — E119 Type 2 diabetes mellitus without complications: Secondary | ICD-10-CM | POA: Diagnosis not present

## 2016-07-29 DIAGNOSIS — D3131 Benign neoplasm of right choroid: Secondary | ICD-10-CM | POA: Diagnosis not present

## 2016-07-29 DIAGNOSIS — H1851 Endothelial corneal dystrophy: Secondary | ICD-10-CM | POA: Diagnosis not present

## 2016-08-14 ENCOUNTER — Encounter: Payer: Self-pay | Admitting: Cardiology

## 2016-08-14 ENCOUNTER — Ambulatory Visit (INDEPENDENT_AMBULATORY_CARE_PROVIDER_SITE_OTHER): Payer: Medicare Other | Admitting: Cardiology

## 2016-08-14 VITALS — BP 148/72 | HR 75 | Ht 61.0 in | Wt 157.6 lb

## 2016-08-14 DIAGNOSIS — I10 Essential (primary) hypertension: Secondary | ICD-10-CM | POA: Diagnosis not present

## 2016-08-14 DIAGNOSIS — I442 Atrioventricular block, complete: Secondary | ICD-10-CM

## 2016-08-14 DIAGNOSIS — I2510 Atherosclerotic heart disease of native coronary artery without angina pectoris: Secondary | ICD-10-CM | POA: Diagnosis not present

## 2016-08-14 LAB — CUP PACEART INCLINIC DEVICE CHECK
Battery Remaining Longevity: 124 mo
Battery Voltage: 3.01 V
Brady Statistic RV Percent Paced: 99.92 %
Implantable Lead Implant Date: 20170803
Implantable Pulse Generator Implant Date: 20170803
Lead Channel Impedance Value: 450 Ohm
Lead Channel Pacing Threshold Amplitude: 0.625 V
Lead Channel Pacing Threshold Pulse Width: 0.5 ms
Lead Channel Pacing Threshold Pulse Width: 0.5 ms
Lead Channel Sensing Intrinsic Amplitude: 4.5 mV
Lead Channel Setting Pacing Amplitude: 0.875
Lead Channel Setting Pacing Amplitude: 2 V
Lead Channel Setting Sensing Sensitivity: 2 mV
MDC IDC LEAD IMPLANT DT: 20170803
MDC IDC LEAD LOCATION: 753859
MDC IDC LEAD LOCATION: 753860
MDC IDC MSMT LEADCHNL RA IMPEDANCE VALUE: 437.5 Ohm
MDC IDC MSMT LEADCHNL RA PACING THRESHOLD AMPLITUDE: 0.5 V
MDC IDC MSMT LEADCHNL RA SENSING INTR AMPL: 1.7 mV
MDC IDC PG SERIAL: 7929478
MDC IDC SESS DTM: 20180808120053
MDC IDC SET LEADCHNL RV PACING PULSEWIDTH: 0.5 ms
MDC IDC STAT BRADY RA PERCENT PACED: 13 %

## 2016-08-14 NOTE — Progress Notes (Signed)
Electrophysiology Office Note   Date:  08/14/2016   ID:  Diana Wood, Diana Wood 12-Aug-1932, MRN [id number]  PCP:  [name], MD  Cardiologist:  Johnsie Cancel Primary Electrophysiologist:  Ardath Lepak Meredith Leeds, MD    Chief Complaint  Patient presents with  . Pacemaker Check    AV Block     History of Present Illness: Diana Wood is a 83 y.o. female who presents today for electrophysiology evaluation.   History of CAD s/p CABG 11/2006, h/o atypical chest pain felt due to neuropathy/sternotomy, HTN, HLD, uterine cancer, hypothyroidism, DVT, GERD, anemia. St. Jude pacemaker placed 08/10/15 for complete AV block.  Today, denies symptoms of palpitations, chest pain, shortness of breath, orthopnea, PND, lower extremity edema, claudication, dizziness, presyncope, syncope, bleeding, or neurologic sequela. The patient is tolerating medications without difficulties and is otherwise without complaint today. Continues to have issues with balance. She has had a few falls, that do not appear to be due to positional changes. She Brannan Cassedy mention this to her primary physician.    Past Medical History:  Diagnosis Date  . Anemia   . CAD (coronary artery disease)    a. s/p CABG 2008.  [name] Chest pain   . Deep vein thrombosis ([place])   . Diabetes mellitus   . GERD (gastroesophageal reflux disease)   . Hiatal hernia   . Hypercholesterolemia   . Hyperlipidemia   . Hypertension   . Hypothyroid   . Obesity   . Pleural effusion   . Uterine cancer [facility])    Past Surgical History:  Procedure Laterality Date  . ABDOMINAL HYSTERECTOMY    . bilateral feet surgery    . bilateral knee surgeries    . CATARACT EXTRACTION, BILATERAL    . CORONARY ARTERY BYPASS GRAFT     x 4-11'08-Dr. Bartle([facility])  . endoscopic vein harvesting from both thighs     Gilford Raid MD  . EP IMPLANTABLE DEVICE N/A 08/10/2015   Procedure: Pacemaker Implant;  Surgeon: Vonne Mcdanel Meredith Leeds, MD;  Location: [facility];  Service:  Cardiovascular;  Laterality: N/A;  . excision cyst debridement fusion distal interphalangeal joint ring finger  with Mini Acutrak screw 20 mm in length    . EYE SURGERY     corneal transplant -right  . hysterectomy  2000   dx of uterine cancer  . JOINT REPLACEMENT     right knee replacement-Dr. supple  . surgery on 2 fingers     r HAND  . TOTAL KNEE ARTHROPLASTY Left 03/16/2012   Procedure: TOTAL KNEE ARTHROPLASTY;  Surgeon: Gearlean Alf, MD;  [facility]: [facility];  Service: Orthopedics;  Laterality: Left;     Current Outpatient Prescriptions  Medication Sig Dispense Refill  . amLODipine (NORVASC) 10 MG tablet Take 10 mg by mouth daily. At 1500    . Cholecalciferol (VITAMIN D PO) Take 2,000 Units by mouth daily after breakfast.     . fenofibrate (TRICOR) 145 MG tablet Take 145 mg by mouth daily after breakfast.     . hydrALAZINE (APRESOLINE) 25 MG tablet Take 25 mg by mouth 3 (three) times daily. After breakfast, at 1500 and at bedtime    . hydrochlorothiazide (HYDRODIURIL) 12.5 MG tablet Take 12.5 mg by mouth daily after breakfast.     . levothyroxine (SYNTHROID, LEVOTHROID) 125 MCG tablet Take 125 mcg by mouth daily before breakfast.     . metFORMIN (GLUMETZA) 500 MG (MOD) 24 hr tablet Take 1,000 mg by mouth daily before breakfast.     .  Omega-3 Fatty Acids (FISH OIL PO) Take 1 tablet by mouth daily.    [name] omeprazole (PRILOSEC) 20 MG capsule Take 20 mg by mouth daily after breakfast.     . prednisoLONE acetate (PRED FORTE) 1 % ophthalmic suspension Place 1 drop into both eyes at bedtime.     . sertraline (ZOLOFT) 25 MG tablet Take 25 mg by mouth daily after breakfast.      No current facility-administered medications for this visit.     Allergies:   Losartan and Tape   Social History:  The patient  reports that she has never smoked. She has never used smokeless tobacco. She reports that she does not drink alcohol or use drugs.   Family History:  The patient's family history  includes Blindness in her brother; Cancer in her mother; Diabetes in her mother.    ROS:  Please see the history of present illness.   Otherwise, review of systems is positive for Appetite change, fatigue, visual changes, cough, balance issues, dizziness.   All other systems are reviewed and negative.   PHYSICAL EXAM: VS:  BP (!) 148/72   Pulse 75   Ht 5\' 1"  (1.549 m)   Wt 157 lb 9.6 oz (71.5 kg)   BMI 29.78 kg/m  , BMI Body mass index is 29.78 kg/m. GEN: Well nourished, well developed, in no acute distress  HEENT: normal  Neck: no JVD, carotid bruits, or masses Cardiac: RRR; no murmurs, rubs, or gallops,no edema  Respiratory:  clear to auscultation bilaterally, normal work of breathing GI: soft, nontender, nondistended, + BS MS: no deformity or atrophy  Skin: warm and dry, device site well healed Neuro:  Strength and sensation are intact Psych: euthymic mood, full affect  EKG:  EKG is not ordered today. Personal review of the ekg ordered 02/14/16 shows SR, V pacing  Personal review of the device interrogation today. Results in [place]: 02/14/2016: TSH 0.091    Lipid Panel     Component Value Date/Time   CHOL  11/14/2006 0110    145        ATP III CLASSIFICATION:  <200     mg/dL   Desirable  200-239  mg/dL   Borderline High  >=240    mg/dL   High   TRIG 118 11/14/2006 0110   HDL 38 (L) 11/14/2006 0110   CHOLHDL 3.8 11/14/2006 0110   VLDL 24 11/14/2006 0110   LDLCALC  11/14/2006 0110    83        Total Cholesterol/HDL:CHD Risk Coronary Heart Disease Risk Table                     Men   Women  1/2 Average Risk   3.4   3.3     Wt Readings from Last 3 Encounters:  08/14/16 157 lb 9.6 oz (71.5 kg)  02/22/16 160 lb (72.6 kg)  02/14/16 160 lb 12.8 oz (72.9 kg)      Other studies Reviewed: Additional studies/ records that were reviewed today include: Epic notes  ASSESSMENT AND PLAN:  1.  Complete AV block: S/p St. Jude dual chamber pacemaker 08/10/15.  Device functioning appropriately. Remains in complete heart block with V pacing. No changes at this time.  2. HTN: Blood pressure mildly elevated today. She says that her blood pressure has been high over the last few months. She does have follow-up with her primary physician later this month. No changes to her blood pressure medications  at this time.  3. CAD: No current chest pain    Current medicines are reviewed at length with the patient today.   The patient does not have concerns regarding her medicines.  The following changes were made today:  none  Labs/ tests ordered today include:  No orders of the defined types were placed in this encounter.    Disposition:   FU with Kyan Giannone 12 months  Signed, Laykin Rainone Meredith Leeds, MD  08/14/2016 11:20 AM     [facility] [address] [place] [address] [phone] (office) [phone] (fax) [

## 2016-08-14 NOTE — Patient Instructions (Signed)
Your physician wants you to follow-up in: [place] will receive a reminder letter in the mail two months in advance. If you don't receive a letter, please call our office to schedule the follow-up appointment.   If you need a refill on your cardiac medications before your next appointment, please call your pharmacy.

## 2016-08-15 ENCOUNTER — Encounter: Payer: Medicare Other | Admitting: *Deleted

## 2016-08-25 NOTE — Progress Notes (Signed)
Cardiology Office Note    Date:  08/27/2016  ID:  Liddie, Chichester [birth year], MRN [id number] PCP:  [name], MD  Cardiologist:  Dr. Johnsie Cancel    Chief Complaint: None f/u CAD and AV block   History of Present Illness:  Diana Wood is a 83 y.o. female with history of CAD s/p CABG 11/2006, h/o atypical chest pain felt due to neuropathy/sternotomy, HTN, HLD, uterine cancer, hypothyroidism, DVT, GERD, anemia who presents for f/uH H/o normal nuc 2009, prior EF 79%. Seen by PA 08/05/15 and had bradycardia and AV block  08/10/15 STJ MRI compatible dual chamber PPM placed by Dr Curt Bears  Note had suppressed TSH before pacer  And again 02/14/16  Dr Virgina Jock following      Past Medical History:  Diagnosis Date  . Anemia   . CAD (coronary artery disease)    a. s/p CABG 2008.  [name] Chest pain   . Deep vein thrombosis ([place])   . Diabetes mellitus   . GERD (gastroesophageal reflux disease)   . Hiatal hernia   . Hypercholesterolemia   . Hyperlipidemia   . Hypertension   . Hypothyroid   . Obesity   . Pleural effusion   . Uterine cancer [facility])     Past Surgical History:  Procedure Laterality Date  . ABDOMINAL HYSTERECTOMY    . bilateral feet surgery    . bilateral knee surgeries    . CATARACT EXTRACTION, BILATERAL    . CORONARY ARTERY BYPASS GRAFT     x 4-11'08-Dr. Bartle([facility])  . endoscopic vein harvesting from both thighs     Gilford Raid MD  . EP IMPLANTABLE DEVICE N/A 08/10/2015   Procedure: Pacemaker Implant;  Surgeon: Will Meredith Leeds, MD;  Location: [place] [facility];  Service: Cardiovascular;  Laterality: N/A;  . excision cyst debridement fusion distal interphalangeal joint ring finger  with Mini Acutrak screw 20 mm in length    . EYE SURGERY     corneal transplant -right  . hysterectomy  2000   dx of uterine cancer  . JOINT REPLACEMENT     right knee replacement-Dr. supple  . surgery on 2 fingers     r HAND  . TOTAL KNEE ARTHROPLASTY Left 03/16/2012   Procedure: TOTAL  KNEE ARTHROPLASTY;  Surgeon: Gearlean Alf, MD;  [facility];  Service: Orthopedics;  Laterality: Left;    Current Medications: Current Outpatient Prescriptions  Medication Sig Dispense Refill  . amLODipine (NORVASC) 10 MG tablet Take 10 mg by mouth daily. At 1500    . Cholecalciferol (VITAMIN D PO) Take 2,000 Units by mouth daily after breakfast.     . fenofibrate (TRICOR) 145 MG tablet Take 145 mg by mouth daily after breakfast.     . hydrALAZINE (APRESOLINE) 25 MG tablet Take 25 mg by mouth 3 (three) times daily. After breakfast, at 1500 and at bedtime    . hydrochlorothiazide (HYDRODIURIL) 12.5 MG tablet Take 12.5 mg by mouth daily after breakfast.     . levothyroxine (SYNTHROID, LEVOTHROID) 125 MCG tablet Take 125 mcg by mouth daily before breakfast.     . metFORMIN (GLUMETZA) 500 MG (MOD) 24 hr tablet Take 1,000 mg by mouth daily before breakfast.     . Omega-3 Fatty Acids (FISH OIL PO) Take 1 tablet by mouth daily.    [name] omeprazole (PRILOSEC) 20 MG capsule Take 20 mg by mouth daily after breakfast.     . prednisoLONE acetate (PRED FORTE) 1 % ophthalmic suspension  Place 1 drop into both eyes at bedtime.     . sertraline (ZOLOFT) 50 MG tablet Take 50 mg by mouth daily.     No current facility-administered medications for this visit.      Allergies:   Losartan and Tape   Social History   Social History  . Marital status: Married    Spouse name: N/A  . Number of children: 3  . Years of education: N/A   Occupational History  . retired Retired    Network engineer   Social History Main Topics  . Smoking status: Never Smoker  . Smokeless tobacco: Never Used  . Alcohol use No  . Drug use: No  . Sexual activity: Yes   Other Topics Concern  . None   Social History Narrative  . None     Family History:  The patient's family history includes Blindness in her brother; Cancer in her mother; Diabetes in her mother.   ROS:   Please see the history of present illness.  All  other systems are reviewed and otherwise negative.    PHYSICAL EXAM:   VS:  BP 134/86   Pulse 64   Ht 5\' 1"  (1.549 m)   Wt 155 lb 12.8 oz (70.7 kg)   SpO2 98%   BMI 29.44 kg/m   BMI: Body mass index is 29.44 kg/m. GEN: Well nourished, well developed WF, in no acute distress - appears younger than stated age 70: normocephalic, atraumatic Neck: no JVD, carotid bruits, or masses Cardiac:no murmur pacer under right clavicle  Respiratory:  clear to auscultation bilaterally, normal work of breathing GI: soft, nontender, nondistended, + BS MS: no deformity or atrophy  Skin: warm and dry, no rash Neuro:  Alert and Oriented x 3, Strength and sensation are intact, follows commands Psych: euthymic mood, full affect  Wt Readings from Last 3 Encounters:  08/27/16 155 lb 12.8 oz (70.7 kg)  08/14/16 157 lb 9.6 oz (71.5 kg)  02/22/16 160 lb (72.6 kg)      Studies/Labs Reviewed:   EKG:   08/04/15 CHB narrow complex escape 02/14/16  P synch pacing LBBB   Recent Labs: 02/14/2016: TSH 0.091   Lipid Panel   Additional studies/ records that were reviewed today include: Summarized above.    ASSESSMENT & PLAN:   1. PPM:  Not pacer dependant normal function f/u EP Camnitz 2. CAD s/p CABG - non ischemic myovue 02/22/16 EF 66%  3. Chol:  Labs followed by Virgina Jock on fibrate not sure why not on statin 4. Thyroid:  Suppressed before her pacer placement  F/u labs today  Lab Results  Component Value Date   TSH 0.091 (L) 02/14/2016  will recheck thyroid labs today and forward to Dr Quin Hoop

## 2016-08-27 ENCOUNTER — Ambulatory Visit (INDEPENDENT_AMBULATORY_CARE_PROVIDER_SITE_OTHER): Payer: Medicare Other | Admitting: Cardiovascular Disease

## 2016-08-27 ENCOUNTER — Encounter: Payer: Self-pay | Admitting: Cardiovascular Disease

## 2016-08-27 VITALS — BP 134/86 | HR 64 | Ht 61.0 in | Wt 155.8 lb

## 2016-08-27 DIAGNOSIS — E039 Hypothyroidism, unspecified: Secondary | ICD-10-CM | POA: Diagnosis not present

## 2016-08-27 DIAGNOSIS — I2510 Atherosclerotic heart disease of native coronary artery without angina pectoris: Secondary | ICD-10-CM | POA: Diagnosis not present

## 2016-08-27 DIAGNOSIS — Z951 Presence of aortocoronary bypass graft: Secondary | ICD-10-CM | POA: Diagnosis not present

## 2016-08-27 LAB — TSH+T4F+T3FREE
Free T4: 2.12 ng/dL — ABNORMAL HIGH (ref 0.82–1.77)
T3 FREE: 2.3 pg/mL (ref 2.0–4.4)
TSH: 0.155 u[IU]/mL — AB (ref 0.450–4.500)

## 2016-08-27 NOTE — Patient Instructions (Addendum)
Medication Instructions:  Your physician recommends that you continue on your current medications as directed. Please refer to the Current Medication list given to you today.  Labwork: Your physician recommends that you have lab work today- TSH, T 4 and Free T 3   Testing/Procedures: NONE  Follow-Up: Your physician wants you to follow-up in: 6 months with Dr. Johnsie Cancel. You will receive a reminder letter in the mail two months in advance. If you don't receive a letter, please call our office to schedule the follow-up appointment.   If you need a refill on your cardiac medications before your next appointment, please call your pharmacy.

## 2016-08-28 ENCOUNTER — Telehealth: Payer: Self-pay | Admitting: Cardiovascular Disease

## 2016-08-28 NOTE — Telephone Encounter (Signed)
Called patient's daughter [facility]) with lab results. She will need to call patient's PCP, Dr. Virgina Jock to follow up.

## 2016-08-28 NOTE — Telephone Encounter (Signed)
New message      Questions regarding thyroid results.  They got them in [place], but they have questions.  Please call

## 2016-09-02 ENCOUNTER — Other Ambulatory Visit: Payer: Self-pay

## 2016-09-02 NOTE — Telephone Encounter (Signed)
Patient's daughter called and stated that Dr. Virgina Jock changed patient's levothyroxine to 112 mcg. Made changes to patient's medication list.

## 2016-09-03 DIAGNOSIS — H5200 Hypermetropia, unspecified eye: Secondary | ICD-10-CM | POA: Diagnosis not present

## 2016-09-03 DIAGNOSIS — E119 Type 2 diabetes mellitus without complications: Secondary | ICD-10-CM | POA: Diagnosis not present

## 2016-09-03 DIAGNOSIS — H524 Presbyopia: Secondary | ICD-10-CM | POA: Diagnosis not present

## 2016-09-03 DIAGNOSIS — H5210 Myopia, unspecified eye: Secondary | ICD-10-CM | POA: Diagnosis not present

## 2016-09-03 DIAGNOSIS — H52229 Regular astigmatism, unspecified eye: Secondary | ICD-10-CM | POA: Diagnosis not present

## 2016-09-17 DIAGNOSIS — Z23 Encounter for immunization: Secondary | ICD-10-CM | POA: Diagnosis not present

## 2016-11-05 DIAGNOSIS — E119 Type 2 diabetes mellitus without complications: Secondary | ICD-10-CM | POA: Diagnosis not present

## 2016-11-05 DIAGNOSIS — M1990 Unspecified osteoarthritis, unspecified site: Secondary | ICD-10-CM | POA: Diagnosis not present

## 2016-11-05 DIAGNOSIS — E038 Other specified hypothyroidism: Secondary | ICD-10-CM | POA: Diagnosis not present

## 2016-11-05 DIAGNOSIS — I119 Hypertensive heart disease without heart failure: Secondary | ICD-10-CM | POA: Diagnosis not present

## 2016-11-05 DIAGNOSIS — E668 Other obesity: Secondary | ICD-10-CM | POA: Diagnosis not present

## 2016-11-05 DIAGNOSIS — Z950 Presence of cardiac pacemaker: Secondary | ICD-10-CM | POA: Diagnosis not present

## 2016-11-05 DIAGNOSIS — I2510 Atherosclerotic heart disease of native coronary artery without angina pectoris: Secondary | ICD-10-CM | POA: Diagnosis not present

## 2016-11-05 DIAGNOSIS — Z6830 Body mass index (BMI) 30.0-30.9, adult: Secondary | ICD-10-CM | POA: Diagnosis not present

## 2016-11-13 ENCOUNTER — Ambulatory Visit (INDEPENDENT_AMBULATORY_CARE_PROVIDER_SITE_OTHER): Payer: Medicare Other | Admitting: *Deleted

## 2016-11-13 DIAGNOSIS — I442 Atrioventricular block, complete: Secondary | ICD-10-CM

## 2016-11-13 NOTE — Progress Notes (Signed)
Remote pacemaker transmission.   

## 2016-11-14 ENCOUNTER — Encounter: Payer: Self-pay | Admitting: Cardiology

## 2016-12-01 LAB — CUP PACEART REMOTE DEVICE CHECK
Battery Remaining Longevity: 120 mo
Battery Voltage: 3.01 V
Brady Statistic AS VS Percent: 1 %
Brady Statistic RA Percent Paced: 19 %
Date Time Interrogation Session: 20181107070552
Implantable Lead Implant Date: 20170803
Implantable Lead Location: 753860
Implantable Pulse Generator Implant Date: 20170803
Lead Channel Pacing Threshold Pulse Width: 0.5 ms
Lead Channel Sensing Intrinsic Amplitude: 2.4 mV
Lead Channel Setting Pacing Amplitude: 2 V
Lead Channel Setting Sensing Sensitivity: 2 mV
MDC IDC LEAD IMPLANT DT: 20170803
MDC IDC LEAD LOCATION: 753859
MDC IDC MSMT BATTERY REMAINING PERCENTAGE: 95.5 %
MDC IDC MSMT LEADCHNL RA IMPEDANCE VALUE: 410 Ohm
MDC IDC MSMT LEADCHNL RA PACING THRESHOLD AMPLITUDE: 0.5 V
MDC IDC MSMT LEADCHNL RA SENSING INTR AMPL: 2 mV
MDC IDC MSMT LEADCHNL RV IMPEDANCE VALUE: 410 Ohm
MDC IDC MSMT LEADCHNL RV PACING THRESHOLD AMPLITUDE: 0.625 V
MDC IDC MSMT LEADCHNL RV PACING THRESHOLD PULSEWIDTH: 0.5 ms
MDC IDC SET LEADCHNL RV PACING AMPLITUDE: 0.875
MDC IDC SET LEADCHNL RV PACING PULSEWIDTH: 0.5 ms
MDC IDC STAT BRADY AP VP PERCENT: 19 %
MDC IDC STAT BRADY AP VS PERCENT: 1 %
MDC IDC STAT BRADY AS VP PERCENT: 81 %
MDC IDC STAT BRADY RV PERCENT PACED: 99 %
Pulse Gen Model: 2272
Pulse Gen Serial Number: 7929478

## 2017-01-01 DIAGNOSIS — Z6830 Body mass index (BMI) 30.0-30.9, adult: Secondary | ICD-10-CM | POA: Diagnosis not present

## 2017-01-01 DIAGNOSIS — M545 Low back pain, unspecified: Secondary | ICD-10-CM | POA: Insufficient documentation

## 2017-01-08 DIAGNOSIS — Z79899 Other long term (current) drug therapy: Secondary | ICD-10-CM | POA: Diagnosis not present

## 2017-01-08 DIAGNOSIS — E876 Hypokalemia: Secondary | ICD-10-CM | POA: Diagnosis not present

## 2017-01-08 DIAGNOSIS — R55 Syncope and collapse: Secondary | ICD-10-CM | POA: Diagnosis not present

## 2017-01-08 DIAGNOSIS — J9811 Atelectasis: Secondary | ICD-10-CM | POA: Diagnosis not present

## 2017-01-08 DIAGNOSIS — Z7984 Long term (current) use of oral hypoglycemic drugs: Secondary | ICD-10-CM | POA: Diagnosis not present

## 2017-01-08 DIAGNOSIS — Z951 Presence of aortocoronary bypass graft: Secondary | ICD-10-CM | POA: Diagnosis not present

## 2017-01-08 DIAGNOSIS — I2510 Atherosclerotic heart disease of native coronary artery without angina pectoris: Secondary | ICD-10-CM | POA: Diagnosis not present

## 2017-01-08 DIAGNOSIS — I10 Essential (primary) hypertension: Secondary | ICD-10-CM | POA: Diagnosis not present

## 2017-01-08 DIAGNOSIS — M6281 Muscle weakness (generalized): Secondary | ICD-10-CM | POA: Diagnosis not present

## 2017-01-08 DIAGNOSIS — E119 Type 2 diabetes mellitus without complications: Secondary | ICD-10-CM | POA: Diagnosis not present

## 2017-01-14 DIAGNOSIS — Z79899 Other long term (current) drug therapy: Secondary | ICD-10-CM | POA: Diagnosis not present

## 2017-01-14 DIAGNOSIS — E559 Vitamin D deficiency, unspecified: Secondary | ICD-10-CM | POA: Diagnosis not present

## 2017-01-14 DIAGNOSIS — E119 Type 2 diabetes mellitus without complications: Secondary | ICD-10-CM | POA: Diagnosis not present

## 2017-01-14 DIAGNOSIS — M545 Low back pain: Secondary | ICD-10-CM | POA: Diagnosis not present

## 2017-01-14 DIAGNOSIS — E876 Hypokalemia: Secondary | ICD-10-CM | POA: Insufficient documentation

## 2017-01-14 DIAGNOSIS — R5383 Other fatigue: Secondary | ICD-10-CM | POA: Diagnosis not present

## 2017-01-14 DIAGNOSIS — I10 Essential (primary) hypertension: Secondary | ICD-10-CM | POA: Diagnosis not present

## 2017-01-14 DIAGNOSIS — R413 Other amnesia: Secondary | ICD-10-CM | POA: Diagnosis not present

## 2017-01-14 DIAGNOSIS — Z6829 Body mass index (BMI) 29.0-29.9, adult: Secondary | ICD-10-CM | POA: Diagnosis not present

## 2017-02-03 DIAGNOSIS — Z85828 Personal history of other malignant neoplasm of skin: Secondary | ICD-10-CM | POA: Diagnosis not present

## 2017-02-03 DIAGNOSIS — L905 Scar conditions and fibrosis of skin: Secondary | ICD-10-CM | POA: Diagnosis not present

## 2017-02-07 DIAGNOSIS — M25512 Pain in left shoulder: Secondary | ICD-10-CM | POA: Diagnosis not present

## 2017-02-07 DIAGNOSIS — S40012A Contusion of left shoulder, initial encounter: Secondary | ICD-10-CM | POA: Diagnosis not present

## 2017-02-12 ENCOUNTER — Ambulatory Visit (INDEPENDENT_AMBULATORY_CARE_PROVIDER_SITE_OTHER): Payer: Medicare Other | Admitting: *Deleted

## 2017-02-12 DIAGNOSIS — I442 Atrioventricular block, complete: Secondary | ICD-10-CM

## 2017-02-12 NOTE — Progress Notes (Signed)
Remote pacemaker transmission.   

## 2017-02-13 ENCOUNTER — Encounter: Payer: Self-pay | Admitting: Cardiology

## 2017-02-20 DIAGNOSIS — H04123 Dry eye syndrome of bilateral lacrimal glands: Secondary | ICD-10-CM | POA: Diagnosis not present

## 2017-02-25 NOTE — Progress Notes (Signed)
Cardiology Office Note    Date:  03/05/2017  ID:  Diana Wood, Diana Wood 1932-06-16, MRN [id number] PCP:  [name], MD  Cardiologist:  Dr. Johnsie Cancel    Chief Complaint: None f/u CAD and AV block   History of Present Illness:   84 y.o. history of CABG 11/2006, HTN, HLD, Hypothyroidism, DVT, GERD and history of uterine cancer Last myovue normal 02/22/16  Developed bradycardia and had a MRI compatible  PPM placed by Dr Curt Bears August 2017 Edison Nasuti for thyroid and TSH suppressed August 2018 and dose synthroid reduced  Having some stress with her husband who seems to be more short tempered Had low K and TIA like symptoms beginning of 2018 resolved     Past Medical History:  Diagnosis Date  . Anemia   . CAD (coronary artery disease)    a. s/p CABG 2008.  [name] Chest pain   . Deep vein thrombosis ([place])   . Diabetes mellitus   . GERD (gastroesophageal reflux disease)   . Hiatal hernia   . Hypercholesterolemia   . Hyperlipidemia   . Hypertension   . Hypothyroid   . Obesity   . Pleural effusion   . Uterine cancer [facility])     Past Surgical History:  Procedure Laterality Date  . ABDOMINAL HYSTERECTOMY    . bilateral feet surgery    . bilateral knee surgeries    . CATARACT EXTRACTION, BILATERAL    . CORONARY ARTERY BYPASS GRAFT     x 4-11'08-Dr. Bartle([facility])  . endoscopic vein harvesting from both thighs     Gilford Raid MD  . EP IMPLANTABLE DEVICE N/A 08/10/2015   Procedure: Pacemaker Implant;  Surgeon: Will Meredith Leeds, MD;  Location: [facility];  Service: Cardiovascular;  Laterality: N/A;  . excision cyst debridement fusion distal interphalangeal joint ring finger  with Mini Acutrak screw 20 mm in length    . EYE SURGERY     corneal transplant -right  . hysterectomy  2000   dx of uterine cancer  . JOINT REPLACEMENT     right knee replacement-Dr. supple  . surgery on 2 fingers     r HAND  . TOTAL KNEE ARTHROPLASTY Left 03/16/2012   Procedure: TOTAL KNEE  ARTHROPLASTY;  Surgeon: Gearlean Alf, MD;  Location: [facility];  Service: Orthopedics;  Laterality: Left;    Current Medications: Current Outpatient Medications  Medication Sig Dispense Refill  . amLODipine (NORVASC) 10 MG tablet Take 10 mg by mouth daily. At 1500    . Cholecalciferol (VITAMIN D PO) Take 2,000 Units by mouth daily after breakfast.     . fenofibrate (TRICOR) 145 MG tablet Take 145 mg by mouth daily after breakfast.     . hydrALAZINE (APRESOLINE) 25 MG tablet Take 25 mg by mouth 3 (three) times daily. After breakfast, at 1500 and at bedtime    . hydrochlorothiazide (HYDRODIURIL) 12.5 MG tablet Take 12.5 mg by mouth daily after breakfast.     . levothyroxine (SYNTHROID, LEVOTHROID) 112 MCG tablet Take 112 mcg by mouth daily before breakfast.     . metFORMIN (GLUMETZA) 500 MG (MOD) 24 hr tablet Take 1,000 mg by mouth daily before breakfast.     . Omega-3 Fatty Acids (FISH OIL PO) Take 1 tablet by mouth daily.    [name] omeprazole (PRILOSEC) 20 MG capsule Take 20 mg by mouth daily after breakfast.     . prednisoLONE acetate (PRED FORTE) 1 % ophthalmic suspension Place 1 drop into both  eyes at bedtime.     . sertraline (ZOLOFT) 50 MG tablet Take 50 mg by mouth daily.     No current facility-administered medications for this visit.      Allergies:   Losartan and Tape   Social History   Socioeconomic History  . Marital status: Married    Spouse name: None  . Number of children: 3  . Years of education: None  . Highest education level: None  Social Needs  . Financial resource strain: None  . Food insecurity - worry: None  . Food insecurity - inability: None  . Transportation needs - medical: None  . Transportation needs - non-medical: None  Occupational History  . 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 Smoking status: Never Smoker  . Smokeless tobacco: Never Used  Substance and Sexual Activity  . Alcohol use: No  . Drug use: No    . Sexual activity: Yes  Other Topics Concern  . None  Social History Narrative  . None     Family History:  The patient's family history includes Blindness in her brother; Cancer in her mother; Diabetes in her mother.   ROS:   Please see the history of present illness.  All other systems are reviewed and otherwise negative.    PHYSICAL EXAM:   VS:  BP 122/68   Pulse 62   Ht 5\' 1"  (1.549 m)   Wt 148 lb (67.1 kg)   SpO2 98%   BMI 27.96 kg/m   BMI: Body mass index is 27.96 kg/m. Affect appropriate Healthy:  appears stated age 19: normal Neck supple with no adenopathy JVP normal no bruits no thyromegaly Lungs clear with no wheezing and good diaphragmatic motion Heart:  S1/S2 no murmur, no rub, gallop or click PMI normal Abdomen: benighn, BS positve, no tenderness, no AAA no bruit.  No HSM or HJR Distal pulses intact with no bruits No edema Neuro non-focal Skin warm and dry No muscular weakness   Wt Readings from Last 3 Encounters:  03/05/17 148 lb (67.1 kg)  08/27/16 155 lb 12.8 oz (70.7 kg)  08/14/16 157 lb 9.6 oz (71.5 kg)      Studies/Labs Reviewed:   EKG:   08/04/15 CHB narrow complex escape 02/14/16  P synch pacing LBBB  03/05/17 AV paced with LBBB morphology   Recent Labs: 08/27/2016: TSH 0.155   Lipid Panel   Additional studies/ records that were reviewed today include: Summarized above.    ASSESSMENT & PLAN:   1. PPM:  Not pacer dependant normal function f/u EP Camnitz St Jude device MRI compatible  2. CAD s/p CABG - non ischemic myovue 02/22/16 EF 66%  3. Chol:  Labs followed by Virgina Jock on fibrate not sure why not on statin 4. Thyroid:  Synthroid dose reduced f/u labs with Virgina Jock Lab Results  Component Value Date   TSH 0.155 (L) 08/27/2016     Jenkins Rouge

## 2017-02-26 ENCOUNTER — Encounter: Payer: Self-pay | Admitting: Cardiovascular Disease

## 2017-03-05 ENCOUNTER — Encounter: Payer: Self-pay | Admitting: Cardiovascular Disease

## 2017-03-05 ENCOUNTER — Ambulatory Visit (INDEPENDENT_AMBULATORY_CARE_PROVIDER_SITE_OTHER): Payer: Medicare Other | Admitting: Cardiovascular Disease

## 2017-03-05 VITALS — BP 122/68 | HR 62 | Ht 61.0 in | Wt 148.0 lb

## 2017-03-05 DIAGNOSIS — I25810 Atherosclerosis of coronary artery bypass graft(s) without angina pectoris: Secondary | ICD-10-CM | POA: Diagnosis not present

## 2017-03-05 DIAGNOSIS — I10 Essential (primary) hypertension: Secondary | ICD-10-CM

## 2017-03-05 NOTE — Patient Instructions (Addendum)

## 2017-03-05 NOTE — Addendum Note (Signed)
Addended by: Joaquim Lai on: 03/05/2017 04:53 PM   Modules accepted: Orders

## 2017-03-13 DIAGNOSIS — I700 Atherosclerosis of aorta: Secondary | ICD-10-CM | POA: Diagnosis not present

## 2017-03-13 DIAGNOSIS — D692 Other nonthrombocytopenic purpura: Secondary | ICD-10-CM | POA: Diagnosis not present

## 2017-03-13 DIAGNOSIS — F325 Major depressive disorder, single episode, in full remission: Secondary | ICD-10-CM | POA: Diagnosis not present

## 2017-03-13 DIAGNOSIS — E119 Type 2 diabetes mellitus without complications: Secondary | ICD-10-CM | POA: Diagnosis not present

## 2017-03-13 DIAGNOSIS — R413 Other amnesia: Secondary | ICD-10-CM | POA: Diagnosis not present

## 2017-03-13 DIAGNOSIS — Z6826 Body mass index (BMI) 26.0-26.9, adult: Secondary | ICD-10-CM | POA: Diagnosis not present

## 2017-03-13 DIAGNOSIS — E668 Other obesity: Secondary | ICD-10-CM | POA: Diagnosis not present

## 2017-03-13 DIAGNOSIS — E038 Other specified hypothyroidism: Secondary | ICD-10-CM | POA: Diagnosis not present

## 2017-03-13 DIAGNOSIS — I2510 Atherosclerotic heart disease of native coronary artery without angina pectoris: Secondary | ICD-10-CM | POA: Diagnosis not present

## 2017-03-13 DIAGNOSIS — E7849 Other hyperlipidemia: Secondary | ICD-10-CM | POA: Diagnosis not present

## 2017-03-13 DIAGNOSIS — I119 Hypertensive heart disease without heart failure: Secondary | ICD-10-CM | POA: Diagnosis not present

## 2017-03-13 DIAGNOSIS — Z950 Presence of cardiac pacemaker: Secondary | ICD-10-CM | POA: Diagnosis not present

## 2017-03-17 ENCOUNTER — Other Ambulatory Visit: Payer: Self-pay

## 2017-03-17 LAB — CUP PACEART REMOTE DEVICE CHECK
Battery Remaining Longevity: 121 mo
Brady Statistic AP VP Percent: 20 %
Brady Statistic AP VS Percent: 1 %
Brady Statistic AS VP Percent: 80 %
Brady Statistic RV Percent Paced: 99 %
Date Time Interrogation Session: 20190206070015
Implantable Lead Implant Date: 20170803
Lead Channel Impedance Value: 410 Ohm
Lead Channel Impedance Value: 410 Ohm
Lead Channel Pacing Threshold Amplitude: 0.625 V
Lead Channel Sensing Intrinsic Amplitude: 2.5 mV
Lead Channel Setting Pacing Amplitude: 0.875
Lead Channel Setting Pacing Amplitude: 2 V
Lead Channel Setting Pacing Pulse Width: 0.5 ms
Lead Channel Setting Sensing Sensitivity: 2 mV
MDC IDC LEAD IMPLANT DT: 20170803
MDC IDC LEAD LOCATION: 753859
MDC IDC LEAD LOCATION: 753860
MDC IDC MSMT BATTERY REMAINING PERCENTAGE: 95.5 %
MDC IDC MSMT BATTERY VOLTAGE: 3.01 V
MDC IDC MSMT LEADCHNL RA PACING THRESHOLD AMPLITUDE: 0.5 V
MDC IDC MSMT LEADCHNL RA PACING THRESHOLD PULSEWIDTH: 0.5 ms
MDC IDC MSMT LEADCHNL RA SENSING INTR AMPL: 2.9 mV
MDC IDC MSMT LEADCHNL RV PACING THRESHOLD PULSEWIDTH: 0.5 ms
MDC IDC PG IMPLANT DT: 20170803
MDC IDC STAT BRADY AS VS PERCENT: 1 %
MDC IDC STAT BRADY RA PERCENT PACED: 20 %
Pulse Gen Model: 2272
Pulse Gen Serial Number: 7929478

## 2017-03-17 MED ORDER — NITROGLYCERIN 0.4 MG SL SUBL: 0.4000 mg | SUBLINGUAL_TABLET | SUBLINGUAL | 3 refills | Status: DC | PRN

## 2017-03-17 NOTE — Progress Notes (Signed)
Patient was at an appointment with husband and Dr. Johnsie Cancel ordered nitroglycerin for both patient and her husband. Sent Nitro to [facility].

## 2017-05-14 ENCOUNTER — Ambulatory Visit (INDEPENDENT_AMBULATORY_CARE_PROVIDER_SITE_OTHER): Payer: Medicare Other | Admitting: *Deleted

## 2017-05-14 DIAGNOSIS — I442 Atrioventricular block, complete: Secondary | ICD-10-CM | POA: Diagnosis not present

## 2017-05-14 NOTE — Progress Notes (Signed)
Remote pacemaker transmission.   

## 2017-05-15 ENCOUNTER — Encounter: Payer: Self-pay | Admitting: Cardiology

## 2017-06-10 LAB — CUP PACEART REMOTE DEVICE CHECK
Battery Remaining Longevity: 121 mo
Battery Remaining Percentage: 95.5 %
Brady Statistic AS VP Percent: 79 %
Brady Statistic RA Percent Paced: 21 %
Implantable Lead Implant Date: 20170803
Implantable Lead Location: 753860
Lead Channel Impedance Value: 440 Ohm
Lead Channel Pacing Threshold Amplitude: 0.5 V
Lead Channel Pacing Threshold Pulse Width: 0.5 ms
Lead Channel Sensing Intrinsic Amplitude: 2.2 mV
Lead Channel Sensing Intrinsic Amplitude: 2.9 mV
Lead Channel Setting Pacing Amplitude: 2 V
MDC IDC LEAD IMPLANT DT: 20170803
MDC IDC LEAD LOCATION: 753859
MDC IDC MSMT BATTERY VOLTAGE: 2.99 V
MDC IDC MSMT LEADCHNL RV IMPEDANCE VALUE: 460 Ohm
MDC IDC MSMT LEADCHNL RV PACING THRESHOLD AMPLITUDE: 0.75 V
MDC IDC MSMT LEADCHNL RV PACING THRESHOLD PULSEWIDTH: 0.5 ms
MDC IDC PG IMPLANT DT: 20170803
MDC IDC PG SERIAL: 7929478
MDC IDC SESS DTM: 20190508060015
MDC IDC SET LEADCHNL RV PACING AMPLITUDE: 1 V
MDC IDC SET LEADCHNL RV PACING PULSEWIDTH: 0.5 ms
MDC IDC SET LEADCHNL RV SENSING SENSITIVITY: 2 mV
MDC IDC STAT BRADY AP VP PERCENT: 21 %
MDC IDC STAT BRADY AP VS PERCENT: 1 %
MDC IDC STAT BRADY AS VS PERCENT: 1 %
MDC IDC STAT BRADY RV PERCENT PACED: 99 %

## 2017-08-13 ENCOUNTER — Ambulatory Visit (INDEPENDENT_AMBULATORY_CARE_PROVIDER_SITE_OTHER): Payer: Medicare Other | Admitting: *Deleted

## 2017-08-13 DIAGNOSIS — I442 Atrioventricular block, complete: Secondary | ICD-10-CM | POA: Diagnosis not present

## 2017-08-14 NOTE — Progress Notes (Signed)
Remote pacemaker transmission.   

## 2017-08-18 ENCOUNTER — Ambulatory Visit (INDEPENDENT_AMBULATORY_CARE_PROVIDER_SITE_OTHER): Payer: Medicare Other | Admitting: Cardiology

## 2017-08-18 ENCOUNTER — Encounter: Payer: Self-pay | Admitting: Cardiology

## 2017-08-18 VITALS — BP 132/78 | HR 66 | Ht 61.0 in | Wt 152.2 lb

## 2017-08-18 DIAGNOSIS — I442 Atrioventricular block, complete: Secondary | ICD-10-CM

## 2017-08-18 DIAGNOSIS — I10 Essential (primary) hypertension: Secondary | ICD-10-CM | POA: Diagnosis not present

## 2017-08-18 DIAGNOSIS — I25810 Atherosclerosis of coronary artery bypass graft(s) without angina pectoris: Secondary | ICD-10-CM

## 2017-08-18 DIAGNOSIS — I2510 Atherosclerotic heart disease of native coronary artery without angina pectoris: Secondary | ICD-10-CM

## 2017-08-18 LAB — CUP PACEART INCLINIC DEVICE CHECK
Battery Remaining Longevity: 123 mo
Brady Statistic RA Percent Paced: 22 %
Implantable Lead Implant Date: 20170803
Implantable Lead Implant Date: 20170803
Implantable Pulse Generator Implant Date: 20170803
Lead Channel Impedance Value: 412.5 Ohm
Lead Channel Pacing Threshold Amplitude: 0.625 V
Lead Channel Pacing Threshold Pulse Width: 0.5 ms
Lead Channel Sensing Intrinsic Amplitude: 2.6 mV
Lead Channel Setting Pacing Amplitude: 2 V
Lead Channel Setting Pacing Pulse Width: 0.5 ms
Lead Channel Setting Sensing Sensitivity: 2 mV
MDC IDC LEAD LOCATION: 753859
MDC IDC LEAD LOCATION: 753860
MDC IDC MSMT BATTERY VOLTAGE: 2.99 V
MDC IDC MSMT LEADCHNL RA PACING THRESHOLD AMPLITUDE: 0.5 V
MDC IDC MSMT LEADCHNL RA PACING THRESHOLD AMPLITUDE: 0.5 V
MDC IDC MSMT LEADCHNL RA PACING THRESHOLD PULSEWIDTH: 0.5 ms
MDC IDC MSMT LEADCHNL RV IMPEDANCE VALUE: 437.5 Ohm
MDC IDC MSMT LEADCHNL RV PACING THRESHOLD PULSEWIDTH: 0.5 ms
MDC IDC MSMT LEADCHNL RV SENSING INTR AMPL: 10.3 mV
MDC IDC PG SERIAL: 7929478
MDC IDC SESS DTM: 20190812122841
MDC IDC SET LEADCHNL RV PACING AMPLITUDE: 0.875
MDC IDC STAT BRADY RV PERCENT PACED: 99.87 %
Pulse Gen Model: 2272

## 2017-08-18 NOTE — Progress Notes (Signed)
Electrophysiology Office Note   Date:  08/18/2017   ID:  Olivette, Beckmann 06-12-1932, MRN [id number]  PCP:  [name], MD  Cardiologist:  Johnsie Cancel Primary Electrophysiologist:  Will Meredith Leeds, MD    No chief complaint on file.    History of Present Illness: Diana Wood is a 84 y.o. female who presents today for electrophysiology evaluation.   History of CAD s/p CABG 11/2006, h/o atypical chest pain felt due to neuropathy/sternotomy, HTN, HLD, uterine cancer, hypothyroidism, DVT, GERD, anemia. St. Jude pacemaker placed 08/10/15 for complete AV block.  Today, denies symptoms of palpitations, chest pain, shortness of breath, orthopnea, PND, lower extremity edema, claudication, dizziness, presyncope, syncope, bleeding, or neurologic sequela. The patient is tolerating medications without difficulties.  She is overall feeling well today.  Intermittently she does get some shooting chest pain in the left side of her chest.  Feels like is under her left breast.  The pain is not exacerbated or alleviated by anything.  It lasts approximately 1 second.    Past Medical History:  Diagnosis Date  . Anemia   . CAD (coronary artery disease)    a. s/p CABG 2008.  [name] Chest pain   . Deep vein thrombosis ([place])   . Diabetes mellitus   . GERD (gastroesophageal reflux disease)   . Hiatal hernia   . Hypercholesterolemia   . Hyperlipidemia   . Hypertension   . Hypothyroid   . Obesity   . Pleural effusion   . Uterine cancer [facility])    Past Surgical History:  Procedure Laterality Date  . ABDOMINAL HYSTERECTOMY    . bilateral feet surgery    . bilateral knee surgeries    . CATARACT EXTRACTION, BILATERAL    . CORONARY ARTERY BYPASS GRAFT     x 4-11'08-Dr. Bartle([facility])  . endoscopic vein harvesting from both thighs     Gilford Raid MD  . EP IMPLANTABLE DEVICE N/A 08/10/2015   Procedure: Pacemaker Implant;  Surgeon: Will Meredith Leeds, MD;  Location: [facility];  Service:  Cardiovascular;  Laterality: N/A;  . excision cyst debridement fusion distal interphalangeal joint ring finger  with Mini Acutrak screw 20 mm in length    . EYE SURGERY     corneal transplant -right  . hysterectomy  2000   dx of uterine cancer  . JOINT REPLACEMENT     right knee replacement-Dr. supple  . surgery on 2 fingers     r HAND  . TOTAL KNEE ARTHROPLASTY Left 03/16/2012   Procedure: TOTAL KNEE ARTHROPLASTY;  Surgeon: Gearlean Alf, MD;  [facility]: [facility];  Service: Orthopedics;  Laterality: Left;     Current Outpatient Medications  Medication Sig Dispense Refill  . amLODipine (NORVASC) 10 MG tablet Take 10 mg by mouth daily. At 1500    . carboxymethylcellulose (REFRESH PLUS) 0.5 % SOLN Place 1 drop into both eyes 2 (two) times daily.    . Cholecalciferol (VITAMIN D PO) Take 2,000 Units by mouth daily after breakfast.     . fenofibrate (TRICOR) 145 MG tablet Take 145 mg by mouth daily after breakfast.     . hydrALAZINE (APRESOLINE) 25 MG tablet Take 25 mg by mouth 3 (three) times daily. After breakfast, at 1500 and at bedtime    . hydrochlorothiazide (HYDRODIURIL) 12.5 MG tablet Take 12.5 mg by mouth daily after breakfast.     . levothyroxine (SYNTHROID, LEVOTHROID) 112 MCG tablet Take 112 mcg by mouth daily before breakfast.     .  metFORMIN (GLUCOPHAGE) 500 MG tablet Take 1 tablet by mouth 2 (two) times daily.    . Omega-3 Fatty Acids (FISH OIL PO) Take 1 tablet by mouth daily.    [name] omeprazole (PRILOSEC) 20 MG capsule Take 20 mg by mouth daily after breakfast.     . prednisoLONE acetate (PRED FORTE) 1 % ophthalmic suspension Place 1 drop into both eyes every morning.    . sertraline (ZOLOFT) 50 MG tablet Take 50 mg by mouth daily.    . nitroGLYCERIN (NITROSTAT) 0.4 MG SL tablet Place 1 tablet (0.4 mg total) under the tongue every 5 (five) minutes as needed for chest pain. 25 tablet 3   No current facility-administered medications for this visit.     Allergies:   Losartan and  Tape   Social History:  The patient  reports that she has never smoked. She has never used smokeless tobacco. She reports that she does not drink alcohol or use drugs.   Family History:  The patient's family history includes Blindness in her brother; Cancer in her mother; Diabetes in her mother.    ROS:  Please see the history of present illness.   Otherwise, review of systems is positive for none.   All other systems are reviewed and negative.   PHYSICAL EXAM: VS:  BP 132/78   Pulse 66   Ht 5\' 1"  (1.549 m)   Wt 152 lb 3.2 oz (69 kg)   SpO2 96%   BMI 28.76 kg/m  , BMI Body mass index is 28.76 kg/m. GEN: Well nourished, well developed, in no acute distress  HEENT: normal  Neck: no JVD, carotid bruits, or masses Cardiac: RRR; no murmurs, rubs, or gallops,no edema  Respiratory:  clear to auscultation bilaterally, normal work of breathing GI: soft, nontender, nondistended, + BS MS: no deformity or atrophy  Skin: warm and dry, device site well healed Neuro:  Strength and sensation are intact Psych: euthymic mood, full affect  EKG:  EKG is not ordered today. Personal review of the ekg ordered 03/05/17 shows AV paced  Personal review of the device interrogation today. Results in [facility]: 08/27/2016: TSH 0.155    Lipid Panel     Component Value Date/Time   CHOL  11/14/2006 0110    145        ATP III CLASSIFICATION:  <200     mg/dL   Desirable  200-239  mg/dL   Borderline High  >=240    mg/dL   High   TRIG 118 11/14/2006 0110   HDL 38 (L) 11/14/2006 0110   CHOLHDL 3.8 11/14/2006 0110   VLDL 24 11/14/2006 0110   LDLCALC  11/14/2006 0110    83        Total Cholesterol/HDL:CHD Risk Coronary Heart Disease Risk Table                     Men   Women  1/2 Average Risk   3.4   3.3     Wt Readings from Last 3 Encounters:  08/18/17 152 lb 3.2 oz (69 kg)  03/05/17 148 lb (67.1 kg)  08/27/16 155 lb 12.8 oz (70.7 kg)      Other studies Reviewed: Additional  studies/ records that were reviewed today include: Epic notes  ASSESSMENT AND PLAN:  1.  Complete AV block: This post [employer] dual-chamber pacemaker implanted 08/10/2015.  Device functioning appropriately.  She is in complete heart block with 100% ventricular pacing.  No  changes at this time.    2. HTN: Blood pressure well controlled.  No changes.  3. CAD: No chest pain that appears to be cardiac.  I feel that the chest pain that she was having is likely noncardiac in nature.    Current medicines are reviewed at length with the patient today.   The patient does not have concerns regarding her medicines.  The following changes were made today:  none  Labs/ tests ordered today include:  No orders of the defined types were placed in this encounter.    Disposition:   FU with Will Camnitz 12 months  Signed, Will Meredith Leeds, MD  08/18/2017 10:43 AM     [facility] [address] [place] [address] [phone] (office) [phone] (fax) [

## 2017-08-18 NOTE — Patient Instructions (Signed)
Medication Instructions:  Your physician recommends that you continue on your current medications as directed. Please refer to the Current Medication list given to you today.  *If you need a refill on your cardiac medications before your next appointment, please call your pharmacy*  Labwork: None ordered  Testing/Procedures: None ordered  Follow-Up: Remote monitoring is used to monitor your Pacemaker or ICD from home. This monitoring reduces the number of office visits required to check your device to one time per year. It allows [place] to keep an eye on the functioning of your device to ensure it is working properly. You are scheduled for a device check from home on 11/12/2017. You may send your transmission at any time that day. If you have a wireless device, the transmission will be sent automatically. After your physician reviews your transmission, you will receive a postcard with your next transmission date.  Your physician wants you to follow-up in: 1 year with Dr. Curt Bears.  You will receive a reminder letter in the mail two months in advance. If you don't receive a letter, please call our office to schedule the follow-up appointment.  Thank you for choosing [facility]!!   Trinidad Curet, RN [phone]

## 2017-09-01 DIAGNOSIS — I10 Essential (primary) hypertension: Secondary | ICD-10-CM | POA: Diagnosis not present

## 2017-09-01 DIAGNOSIS — M859 Disorder of bone density and structure, unspecified: Secondary | ICD-10-CM | POA: Diagnosis not present

## 2017-09-01 DIAGNOSIS — M545 Low back pain: Secondary | ICD-10-CM | POA: Diagnosis not present

## 2017-09-01 DIAGNOSIS — E7849 Other hyperlipidemia: Secondary | ICD-10-CM | POA: Diagnosis not present

## 2017-09-01 DIAGNOSIS — E119 Type 2 diabetes mellitus without complications: Secondary | ICD-10-CM | POA: Diagnosis not present

## 2017-09-01 DIAGNOSIS — Z6829 Body mass index (BMI) 29.0-29.9, adult: Secondary | ICD-10-CM | POA: Diagnosis not present

## 2017-09-01 DIAGNOSIS — E038 Other specified hypothyroidism: Secondary | ICD-10-CM | POA: Diagnosis not present

## 2017-09-11 LAB — CUP PACEART REMOTE DEVICE CHECK
Battery Voltage: 2.99 V
Brady Statistic AP VP Percent: 22 %
Brady Statistic AP VS Percent: 1 %
Brady Statistic AS VP Percent: 78 %
Brady Statistic AS VS Percent: 1 %
Date Time Interrogation Session: 20190807060013
Implantable Lead Implant Date: 20170803
Implantable Lead Location: 753860
Implantable Pulse Generator Implant Date: 20170803
Lead Channel Impedance Value: 430 Ohm
Lead Channel Impedance Value: 450 Ohm
Lead Channel Pacing Threshold Amplitude: 0.75 V
Lead Channel Pacing Threshold Pulse Width: 0.5 ms
Lead Channel Sensing Intrinsic Amplitude: 2.3 mV
Lead Channel Setting Pacing Pulse Width: 0.5 ms
Lead Channel Setting Sensing Sensitivity: 2 mV
MDC IDC LEAD IMPLANT DT: 20170803
MDC IDC LEAD LOCATION: 753859
MDC IDC MSMT BATTERY REMAINING LONGEVITY: 121 mo
MDC IDC MSMT BATTERY REMAINING PERCENTAGE: 95.5 %
MDC IDC MSMT LEADCHNL RA PACING THRESHOLD AMPLITUDE: 0.5 V
MDC IDC MSMT LEADCHNL RA PACING THRESHOLD PULSEWIDTH: 0.5 ms
MDC IDC MSMT LEADCHNL RA SENSING INTR AMPL: 2.8 mV
MDC IDC SET LEADCHNL RA PACING AMPLITUDE: 2 V
MDC IDC SET LEADCHNL RV PACING AMPLITUDE: 1 V
MDC IDC STAT BRADY RA PERCENT PACED: 22 %
MDC IDC STAT BRADY RV PERCENT PACED: 99 %
Pulse Gen Model: 2272
Pulse Gen Serial Number: 7929478

## 2017-09-19 DIAGNOSIS — M545 Low back pain: Secondary | ICD-10-CM | POA: Diagnosis not present

## 2017-09-19 DIAGNOSIS — E119 Type 2 diabetes mellitus without complications: Secondary | ICD-10-CM | POA: Diagnosis not present

## 2017-09-19 DIAGNOSIS — I517 Cardiomegaly: Secondary | ICD-10-CM | POA: Diagnosis not present

## 2017-09-19 DIAGNOSIS — E7849 Other hyperlipidemia: Secondary | ICD-10-CM | POA: Diagnosis not present

## 2017-09-19 DIAGNOSIS — Z6829 Body mass index (BMI) 29.0-29.9, adult: Secondary | ICD-10-CM | POA: Diagnosis not present

## 2017-09-19 DIAGNOSIS — Z950 Presence of cardiac pacemaker: Secondary | ICD-10-CM | POA: Diagnosis not present

## 2017-09-19 DIAGNOSIS — D692 Other nonthrombocytopenic purpura: Secondary | ICD-10-CM | POA: Diagnosis not present

## 2017-09-19 DIAGNOSIS — I2510 Atherosclerotic heart disease of native coronary artery without angina pectoris: Secondary | ICD-10-CM | POA: Diagnosis not present

## 2017-09-19 DIAGNOSIS — F325 Major depressive disorder, single episode, in full remission: Secondary | ICD-10-CM | POA: Diagnosis not present

## 2017-09-19 DIAGNOSIS — R413 Other amnesia: Secondary | ICD-10-CM | POA: Diagnosis not present

## 2017-09-19 DIAGNOSIS — I119 Hypertensive heart disease without heart failure: Secondary | ICD-10-CM | POA: Diagnosis not present

## 2017-09-19 DIAGNOSIS — Z1389 Encounter for screening for other disorder: Secondary | ICD-10-CM | POA: Diagnosis not present

## 2017-09-19 DIAGNOSIS — R82998 Other abnormal findings in urine: Secondary | ICD-10-CM | POA: Diagnosis not present

## 2017-09-23 DIAGNOSIS — M5136 Other intervertebral disc degeneration, lumbar region: Secondary | ICD-10-CM | POA: Diagnosis not present

## 2017-09-23 DIAGNOSIS — C55 Malignant neoplasm of uterus, part unspecified: Secondary | ICD-10-CM | POA: Insufficient documentation

## 2017-09-23 DIAGNOSIS — M4696 Unspecified inflammatory spondylopathy, lumbar region: Secondary | ICD-10-CM | POA: Diagnosis not present

## 2017-09-23 DIAGNOSIS — I219 Acute myocardial infarction, unspecified: Secondary | ICD-10-CM | POA: Insufficient documentation

## 2017-09-23 DIAGNOSIS — M545 Low back pain: Secondary | ICD-10-CM | POA: Diagnosis not present

## 2017-09-24 DIAGNOSIS — Z1212 Encounter for screening for malignant neoplasm of rectum: Secondary | ICD-10-CM | POA: Diagnosis not present

## 2017-10-09 DIAGNOSIS — M51369 Other intervertebral disc degeneration, lumbar region without mention of lumbar back pain or lower extremity pain: Secondary | ICD-10-CM | POA: Insufficient documentation

## 2017-10-09 DIAGNOSIS — M5136 Other intervertebral disc degeneration, lumbar region: Secondary | ICD-10-CM | POA: Diagnosis not present

## 2017-10-28 DIAGNOSIS — M545 Low back pain: Secondary | ICD-10-CM | POA: Diagnosis not present

## 2017-10-28 DIAGNOSIS — M5136 Other intervertebral disc degeneration, lumbar region: Secondary | ICD-10-CM | POA: Diagnosis not present

## 2017-10-28 DIAGNOSIS — M4696 Unspecified inflammatory spondylopathy, lumbar region: Secondary | ICD-10-CM | POA: Diagnosis not present

## 2017-11-10 DIAGNOSIS — H52229 Regular astigmatism, unspecified eye: Secondary | ICD-10-CM | POA: Diagnosis not present

## 2017-11-10 DIAGNOSIS — H5200 Hypermetropia, unspecified eye: Secondary | ICD-10-CM | POA: Diagnosis not present

## 2017-11-10 DIAGNOSIS — H5210 Myopia, unspecified eye: Secondary | ICD-10-CM | POA: Diagnosis not present

## 2017-11-10 DIAGNOSIS — H5231 Anisometropia: Secondary | ICD-10-CM | POA: Diagnosis not present

## 2017-11-10 DIAGNOSIS — E119 Type 2 diabetes mellitus without complications: Secondary | ICD-10-CM | POA: Diagnosis not present

## 2017-11-10 DIAGNOSIS — H524 Presbyopia: Secondary | ICD-10-CM | POA: Diagnosis not present

## 2017-11-12 ENCOUNTER — Ambulatory Visit (INDEPENDENT_AMBULATORY_CARE_PROVIDER_SITE_OTHER): Payer: Medicare Other | Admitting: *Deleted

## 2017-11-12 DIAGNOSIS — I442 Atrioventricular block, complete: Secondary | ICD-10-CM | POA: Diagnosis not present

## 2017-11-12 DIAGNOSIS — I10 Essential (primary) hypertension: Secondary | ICD-10-CM

## 2017-11-12 NOTE — Progress Notes (Signed)
Remote pacemaker transmission.   

## 2017-11-16 ENCOUNTER — Encounter: Payer: Self-pay | Admitting: Cardiology

## 2017-11-20 DIAGNOSIS — M5136 Other intervertebral disc degeneration, lumbar region: Secondary | ICD-10-CM | POA: Diagnosis not present

## 2017-11-27 DIAGNOSIS — M5136 Other intervertebral disc degeneration, lumbar region: Secondary | ICD-10-CM | POA: Diagnosis not present

## 2017-11-27 DIAGNOSIS — M533 Sacrococcygeal disorders, not elsewhere classified: Secondary | ICD-10-CM | POA: Insufficient documentation

## 2017-12-01 DIAGNOSIS — D3131 Benign neoplasm of right choroid: Secondary | ICD-10-CM | POA: Diagnosis not present

## 2017-12-01 DIAGNOSIS — H04123 Dry eye syndrome of bilateral lacrimal glands: Secondary | ICD-10-CM | POA: Diagnosis not present

## 2017-12-01 DIAGNOSIS — H1851 Endothelial corneal dystrophy: Secondary | ICD-10-CM | POA: Diagnosis not present

## 2017-12-09 DIAGNOSIS — M533 Sacrococcygeal disorders, not elsewhere classified: Secondary | ICD-10-CM | POA: Diagnosis not present

## 2017-12-23 DIAGNOSIS — M545 Low back pain: Secondary | ICD-10-CM | POA: Diagnosis not present

## 2017-12-23 DIAGNOSIS — M533 Sacrococcygeal disorders, not elsewhere classified: Secondary | ICD-10-CM | POA: Diagnosis not present

## 2017-12-23 DIAGNOSIS — M5136 Other intervertebral disc degeneration, lumbar region: Secondary | ICD-10-CM | POA: Diagnosis not present

## 2018-01-01 ENCOUNTER — Other Ambulatory Visit: Payer: Self-pay | Admitting: Physical Medicine and Rehabilitation

## 2018-01-01 DIAGNOSIS — M5136 Other intervertebral disc degeneration, lumbar region: Secondary | ICD-10-CM

## 2018-01-06 ENCOUNTER — Ambulatory Visit
Admission: RE | Admit: 2018-01-06 | Discharge: 2018-01-06 | Disposition: A | Discharge status: Home / Self Care | Payer: Medicare Other | Source: Ambulatory Visit | Attending: Physical Medicine and Rehabilitation | Admitting: Physical Medicine and Rehabilitation

## 2018-01-06 DIAGNOSIS — M47816 Spondylosis without myelopathy or radiculopathy, lumbar region: Secondary | ICD-10-CM | POA: Diagnosis not present

## 2018-01-06 DIAGNOSIS — M5137 Other intervertebral disc degeneration, lumbosacral region: Secondary | ICD-10-CM | POA: Diagnosis not present

## 2018-01-06 DIAGNOSIS — M5136 Other intervertebral disc degeneration, lumbar region: Secondary | ICD-10-CM

## 2018-01-06 DIAGNOSIS — M5126 Other intervertebral disc displacement, lumbar region: Secondary | ICD-10-CM | POA: Diagnosis not present

## 2018-01-06 DIAGNOSIS — M5127 Other intervertebral disc displacement, lumbosacral region: Secondary | ICD-10-CM | POA: Diagnosis not present

## 2018-01-09 DIAGNOSIS — M47896 Other spondylosis, lumbar region: Secondary | ICD-10-CM | POA: Diagnosis not present

## 2018-01-14 LAB — CUP PACEART REMOTE DEVICE CHECK
Battery Remaining Longevity: 117 mo
Battery Voltage: 2.99 V
Brady Statistic AS VP Percent: 65 %
Brady Statistic AS VS Percent: 1 %
Brady Statistic RA Percent Paced: 34 %
Date Time Interrogation Session: 20191106070013
Implantable Lead Implant Date: 20170803
Implantable Lead Location: 753859
Implantable Lead Location: 753860
Lead Channel Impedance Value: 380 Ohm
Lead Channel Impedance Value: 410 Ohm
Lead Channel Pacing Threshold Amplitude: 0.5 V
Lead Channel Pacing Threshold Pulse Width: 0.5 ms
Lead Channel Pacing Threshold Pulse Width: 0.5 ms
Lead Channel Sensing Intrinsic Amplitude: 2.6 mV
Lead Channel Sensing Intrinsic Amplitude: 2.7 mV
Lead Channel Setting Pacing Amplitude: 0.875
Lead Channel Setting Pacing Amplitude: 2 V
MDC IDC LEAD IMPLANT DT: 20170803
MDC IDC MSMT BATTERY REMAINING PERCENTAGE: 95.5 %
MDC IDC MSMT LEADCHNL RV PACING THRESHOLD AMPLITUDE: 0.625 V
MDC IDC PG IMPLANT DT: 20170803
MDC IDC SET LEADCHNL RV PACING PULSEWIDTH: 0.5 ms
MDC IDC SET LEADCHNL RV SENSING SENSITIVITY: 2 mV
MDC IDC STAT BRADY AP VP PERCENT: 35 %
MDC IDC STAT BRADY AP VS PERCENT: 1 %
MDC IDC STAT BRADY RV PERCENT PACED: 99 %
Pulse Gen Model: 2272
Pulse Gen Serial Number: 7929478

## 2018-01-27 DIAGNOSIS — M5136 Other intervertebral disc degeneration, lumbar region: Secondary | ICD-10-CM | POA: Diagnosis not present

## 2018-01-29 DIAGNOSIS — M5136 Other intervertebral disc degeneration, lumbar region: Secondary | ICD-10-CM | POA: Diagnosis not present

## 2018-02-02 DIAGNOSIS — M5136 Other intervertebral disc degeneration, lumbar region: Secondary | ICD-10-CM | POA: Diagnosis not present

## 2018-02-03 DIAGNOSIS — L814 Other melanin hyperpigmentation: Secondary | ICD-10-CM | POA: Diagnosis not present

## 2018-02-03 DIAGNOSIS — Z85828 Personal history of other malignant neoplasm of skin: Secondary | ICD-10-CM | POA: Diagnosis not present

## 2018-02-03 DIAGNOSIS — L570 Actinic keratosis: Secondary | ICD-10-CM | POA: Diagnosis not present

## 2018-02-03 DIAGNOSIS — D225 Melanocytic nevi of trunk: Secondary | ICD-10-CM | POA: Diagnosis not present

## 2018-02-03 DIAGNOSIS — L820 Inflamed seborrheic keratosis: Secondary | ICD-10-CM | POA: Diagnosis not present

## 2018-02-03 DIAGNOSIS — D1801 Hemangioma of skin and subcutaneous tissue: Secondary | ICD-10-CM | POA: Diagnosis not present

## 2018-02-03 DIAGNOSIS — L821 Other seborrheic keratosis: Secondary | ICD-10-CM | POA: Diagnosis not present

## 2018-02-05 DIAGNOSIS — M5136 Other intervertebral disc degeneration, lumbar region: Secondary | ICD-10-CM | POA: Diagnosis not present

## 2018-02-09 DIAGNOSIS — M5136 Other intervertebral disc degeneration, lumbar region: Secondary | ICD-10-CM | POA: Diagnosis not present

## 2018-02-10 DIAGNOSIS — M47816 Spondylosis without myelopathy or radiculopathy, lumbar region: Secondary | ICD-10-CM | POA: Diagnosis not present

## 2018-02-11 ENCOUNTER — Ambulatory Visit (INDEPENDENT_AMBULATORY_CARE_PROVIDER_SITE_OTHER): Payer: Medicare Other

## 2018-02-11 DIAGNOSIS — I442 Atrioventricular block, complete: Secondary | ICD-10-CM

## 2018-02-12 DIAGNOSIS — M5136 Other intervertebral disc degeneration, lumbar region: Secondary | ICD-10-CM | POA: Diagnosis not present

## 2018-02-13 LAB — CUP PACEART REMOTE DEVICE CHECK
Battery Remaining Longevity: 118 mo
Battery Remaining Percentage: 95.5 %
Battery Voltage: 2.99 V
Brady Statistic AP VP Percent: 35 %
Brady Statistic AS VP Percent: 64 %
Brady Statistic AS VS Percent: 1 %
Brady Statistic RA Percent Paced: 35 %
Date Time Interrogation Session: 20200205070014
Implantable Lead Implant Date: 20170803
Implantable Lead Implant Date: 20170803
Implantable Lead Location: 753859
Implantable Lead Location: 753860
Implantable Pulse Generator Implant Date: 20170803
Lead Channel Impedance Value: 400 Ohm
Lead Channel Impedance Value: 440 Ohm
Lead Channel Pacing Threshold Amplitude: 0.5 V
Lead Channel Pacing Threshold Amplitude: 0.625 V
Lead Channel Pacing Threshold Pulse Width: 0.5 ms
Lead Channel Pacing Threshold Pulse Width: 0.5 ms
Lead Channel Sensing Intrinsic Amplitude: 2.3 mV
Lead Channel Sensing Intrinsic Amplitude: 2.5 mV
Lead Channel Setting Pacing Amplitude: 0.875
Lead Channel Setting Pacing Amplitude: 2 V
Lead Channel Setting Pacing Pulse Width: 0.5 ms
Lead Channel Setting Sensing Sensitivity: 2 mV
MDC IDC STAT BRADY AP VS PERCENT: 1 %
MDC IDC STAT BRADY RV PERCENT PACED: 99 %
Pulse Gen Model: 2272
Pulse Gen Serial Number: 7929478

## 2018-02-20 NOTE — Progress Notes (Signed)
Remote pacemaker transmission.   

## 2018-02-25 NOTE — Progress Notes (Signed)
Cardiology Office Note    Date:  03/02/2018  ID:  Diana Wood, Diana Wood 20-Mar-1932, MRN [id number] PCP:  [name], MD  Cardiologist:  Dr. Johnsie Cancel    Chief Complaint: None f/u CAD and AV block   History of Present Illness:   85 y.o. history of CABG 11/2006, HTN, HLD, Hypothyroidism, DVT, GERD and history of uterine cancer Last myovue normal 02/22/16  Developed bradycardia and had a MRI compatible  PPM placed by Dr Curt Bears August 2017 Edison Nasuti for thyroid and TSH suppressed August 2018 and dose synthroid reduced  Having some stress with her husband who seems to be more short tempered Had low K and TIA like symptoms beginning of 2018 resolved   Dr Curt Bears note from 08/18/17 indicated CHB underlying rhythm with 100% V pacing  No cardiac issues. Had her grand daughter Eduard Clos with her today who Is a year old     Past Medical History:  Diagnosis Date  . Anemia   . CAD (coronary artery disease)    a. s/p CABG 2008.  [name] Chest pain   . Deep vein thrombosis ([place])   . Diabetes mellitus   . GERD (gastroesophageal reflux disease)   . Hiatal hernia   . Hypercholesterolemia   . Hyperlipidemia   . Hypertension   . Hypothyroid   . Obesity   . Pleural effusion   . Uterine cancer [facility])     Past Surgical History:  Procedure Laterality Date  . ABDOMINAL HYSTERECTOMY    . bilateral feet surgery    . bilateral knee surgeries    . CATARACT EXTRACTION, BILATERAL    . CORONARY ARTERY BYPASS GRAFT     x 4-11'08-Dr. Bartle([facility])  . endoscopic vein harvesting from both thighs     Gilford Raid MD  . EP IMPLANTABLE DEVICE N/A 08/10/2015   Procedure: Pacemaker Implant;  Surgeon: Will Meredith Leeds, MD;  Location: [facility];  Service: Cardiovascular;  Laterality: N/A;  . excision cyst debridement fusion distal interphalangeal joint ring finger  with Mini Acutrak screw 20 mm in length    . EYE SURGERY     corneal transplant -right  . hysterectomy  2000   dx of uterine cancer  .  JOINT REPLACEMENT     right knee replacement-Dr. supple  . surgery on 2 fingers     r HAND  . TOTAL KNEE ARTHROPLASTY Left 03/16/2012   Procedure: TOTAL KNEE ARTHROPLASTY;  Surgeon: Gearlean Alf, MD;  [facility];  Service: Orthopedics;  Laterality: Left;    Current Medications: Current Outpatient Medications  Medication Sig Dispense Refill  . amLODipine (NORVASC) 10 MG tablet Take 10 mg by mouth daily. At 1500    . carboxymethylcellulose (REFRESH PLUS) 0.5 % SOLN Place 1 drop into both eyes 2 (two) times daily.    . Cholecalciferol (VITAMIN D PO) Take 2,000 Units by mouth daily after breakfast.     . fenofibrate (TRICOR) 145 MG tablet Take 145 mg by mouth daily after breakfast.     . hydrALAZINE (APRESOLINE) 25 MG tablet Take 25 mg by mouth 3 (three) times daily. After breakfast, at 1500 and at bedtime    . hydrochlorothiazide (HYDRODIURIL) 12.5 MG tablet Take 12.5 mg by mouth daily after breakfast.     . levothyroxine (SYNTHROID, LEVOTHROID) 112 MCG tablet Take 112 mcg by mouth daily before breakfast.     . metFORMIN (GLUCOPHAGE) 500 MG tablet Take 1 tablet by mouth daily.     [name]  nitroGLYCERIN (NITROSTAT) 0.4 MG SL tablet Place 1 tablet (0.4 mg total) under the tongue every 5 (five) minutes as needed for chest pain. 25 tablet 3  . Omega-3 Fatty Acids (FISH OIL PO) Take 1 tablet by mouth daily.    [name] omeprazole (PRILOSEC) 20 MG capsule Take 20 mg by mouth daily after breakfast.     . prednisoLONE acetate (PRED FORTE) 1 % ophthalmic suspension Place 1 drop into both eyes every morning.    . sertraline (ZOLOFT) 50 MG tablet Take 50 mg by mouth daily.     No current facility-administered medications for this visit.      Allergies:   Losartan and Tape   Social History   Socioeconomic History  . Marital status: Married    Spouse name: Not on file  . Number of children: 3  . Years of education: Not on file  . Highest education level: Not on file  Occupational History  .  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 Financial resource strain: Not on file  . Food insecurity:    Worry: Not on file    Inability: Not on file  . Transportation needs:    Medical: Not on file    Non-medical: Not on file  Tobacco Use  . Smoking status: Never Smoker  . Smokeless tobacco: Never Used  Substance and Sexual Activity  . Alcohol use: No  . Drug use: No  . Sexual activity: Yes  Lifestyle  . Physical activity:    Days per week: Not on file    Minutes per session: Not on file  . Stress: Not on file  Relationships  . Social connections:    Talks on phone: Not on file    Gets together: Not on file    Attends religious service: Not on file    Active member of club or organization: Not on file    Attends meetings of clubs or organizations: Not on file    Relationship status: Not on file  Other Topics Concern  . Not on file  Social History Narrative  . Not on file     Family History:  The patient's family history includes Blindness in her brother; Cancer in her mother; Diabetes in her mother.   ROS:   Please see the history of present illness.  All other systems are reviewed and otherwise negative.    PHYSICAL EXAM:   VS:  BP 130/76   Pulse 67   Ht 5\' 1"  (1.549 m)   Wt 68.7 kg   SpO2 97%   BMI 28.61 kg/m   BMI: Body mass index is 28.61 kg/m. Affect appropriate Healthy:  appears stated age 59: normal Neck supple with no adenopathy JVP normal no bruits no thyromegaly Lungs clear with no wheezing and good diaphragmatic motion Heart:  S1/S2 no murmur, no rub, gallop or click PMI normal pacer under left clavicle  Abdomen: benighn, BS positve, no tenderness, no AAA no bruit.  No HSM or HJR Distal pulses intact with no bruits No edema Neuro non-focal Skin warm and dry No muscular weakness   Wt Readings from Last 3 Encounters:  03/02/18 68.7 kg  08/18/17 69 kg  03/05/17 67.1 kg      Studies/Labs  Reviewed:   EKG:   08-24-2015 CHB narrow complex escape 02/14/16  P synch pacing LBBB  03/05/17 AV paced with LBBB morphology   Recent Labs: No results found for requested labs  within last 8760 hours.   Lipid Panel   Additional studies/ records that were reviewed today include: Summarized above.    ASSESSMENT & PLAN:   1. PPM:  V pacing 100% of time  f/u EP Camnitz St Jude device MRI compatible  2. CAD s/p CABG - non ischemic myovue 02/22/16 EF 66%  3. Chol:  Labs followed by Virgina Jock on fibrate not sure why not on statin 4. Thyroid:  Synthroid dose reduced f/u labs with Virgina Jock Lab Results  Component Value Date   TSH 0.155 (L) 08/27/2016     Jenkins Rouge

## 2018-03-02 ENCOUNTER — Ambulatory Visit (INDEPENDENT_AMBULATORY_CARE_PROVIDER_SITE_OTHER): Payer: Medicare Other | Admitting: Cardiovascular Disease

## 2018-03-02 ENCOUNTER — Encounter: Payer: Self-pay | Admitting: Cardiovascular Disease

## 2018-03-02 VITALS — BP 130/76 | HR 67 | Ht 61.0 in | Wt 151.4 lb

## 2018-03-02 DIAGNOSIS — I25810 Atherosclerosis of coronary artery bypass graft(s) without angina pectoris: Secondary | ICD-10-CM | POA: Diagnosis not present

## 2018-03-02 NOTE — Patient Instructions (Signed)

## 2018-03-03 DIAGNOSIS — M5136 Other intervertebral disc degeneration, lumbar region: Secondary | ICD-10-CM | POA: Diagnosis not present

## 2018-03-03 DIAGNOSIS — M545 Low back pain: Secondary | ICD-10-CM | POA: Diagnosis not present

## 2018-03-19 DIAGNOSIS — E7849 Other hyperlipidemia: Secondary | ICD-10-CM | POA: Diagnosis not present

## 2018-03-19 DIAGNOSIS — D649 Anemia, unspecified: Secondary | ICD-10-CM | POA: Diagnosis not present

## 2018-03-19 DIAGNOSIS — E039 Hypothyroidism, unspecified: Secondary | ICD-10-CM | POA: Diagnosis not present

## 2018-03-19 DIAGNOSIS — E559 Vitamin D deficiency, unspecified: Secondary | ICD-10-CM | POA: Diagnosis not present

## 2018-03-19 DIAGNOSIS — R82998 Other abnormal findings in urine: Secondary | ICD-10-CM | POA: Diagnosis not present

## 2018-03-19 DIAGNOSIS — E119 Type 2 diabetes mellitus without complications: Secondary | ICD-10-CM | POA: Diagnosis not present

## 2018-03-23 DIAGNOSIS — L821 Other seborrheic keratosis: Secondary | ICD-10-CM | POA: Diagnosis not present

## 2018-03-23 DIAGNOSIS — L570 Actinic keratosis: Secondary | ICD-10-CM | POA: Diagnosis not present

## 2018-03-26 DIAGNOSIS — I700 Atherosclerosis of aorta: Secondary | ICD-10-CM | POA: Diagnosis not present

## 2018-03-26 DIAGNOSIS — I2510 Atherosclerotic heart disease of native coronary artery without angina pectoris: Secondary | ICD-10-CM | POA: Diagnosis not present

## 2018-03-26 DIAGNOSIS — D692 Other nonthrombocytopenic purpura: Secondary | ICD-10-CM | POA: Diagnosis not present

## 2018-03-26 DIAGNOSIS — E119 Type 2 diabetes mellitus without complications: Secondary | ICD-10-CM | POA: Diagnosis not present

## 2018-03-26 DIAGNOSIS — Z23 Encounter for immunization: Secondary | ICD-10-CM | POA: Diagnosis not present

## 2018-03-26 DIAGNOSIS — Z1331 Encounter for screening for depression: Secondary | ICD-10-CM | POA: Diagnosis not present

## 2018-03-26 DIAGNOSIS — Z950 Presence of cardiac pacemaker: Secondary | ICD-10-CM | POA: Diagnosis not present

## 2018-03-26 DIAGNOSIS — M545 Low back pain: Secondary | ICD-10-CM | POA: Diagnosis not present

## 2018-03-26 DIAGNOSIS — I119 Hypertensive heart disease without heart failure: Secondary | ICD-10-CM | POA: Diagnosis not present

## 2018-03-26 DIAGNOSIS — Z0000 Encounter for general adult medical examination without abnormal findings: Secondary | ICD-10-CM | POA: Diagnosis not present

## 2018-03-26 DIAGNOSIS — R413 Other amnesia: Secondary | ICD-10-CM | POA: Diagnosis not present

## 2018-03-26 DIAGNOSIS — Z1339 Encounter for screening examination for other mental health and behavioral disorders: Secondary | ICD-10-CM | POA: Diagnosis not present

## 2018-05-20 ENCOUNTER — Other Ambulatory Visit: Payer: Self-pay

## 2018-05-20 ENCOUNTER — Ambulatory Visit (INDEPENDENT_AMBULATORY_CARE_PROVIDER_SITE_OTHER): Payer: Medicare Other | Admitting: *Deleted

## 2018-05-20 DIAGNOSIS — I442 Atrioventricular block, complete: Secondary | ICD-10-CM

## 2018-05-21 LAB — CUP PACEART REMOTE DEVICE CHECK
Battery Remaining Longevity: 114 mo
Battery Remaining Percentage: 95 %
Battery Voltage: 2.99 V
Brady Statistic RA Percent Paced: 37 %
Brady Statistic RV Percent Paced: 100 %
Date Time Interrogation Session: 20200514162343
Implantable Lead Implant Date: 20170803
Implantable Lead Implant Date: 20170803
Implantable Lead Location: 753859
Implantable Lead Location: 753860
Implantable Pulse Generator Implant Date: 20170803
Lead Channel Impedance Value: 380 Ohm
Lead Channel Impedance Value: 410 Ohm
Lead Channel Pacing Threshold Amplitude: 0.75 V
Lead Channel Pacing Threshold Pulse Width: 0.5 ms
Lead Channel Sensing Intrinsic Amplitude: 1.5 mV
Lead Channel Sensing Intrinsic Amplitude: 2.4 mV
Lead Channel Setting Pacing Amplitude: 0.875
Lead Channel Setting Pacing Amplitude: 2 V
Lead Channel Setting Pacing Pulse Width: 0.5 ms
Lead Channel Setting Sensing Sensitivity: 2 mV
Pulse Gen Model: 2272
Pulse Gen Serial Number: 7929478

## 2018-06-09 NOTE — Progress Notes (Signed)
Remote pacemaker transmission.   

## 2018-06-18 DIAGNOSIS — M5136 Other intervertebral disc degeneration, lumbar region: Secondary | ICD-10-CM | POA: Diagnosis not present

## 2018-07-06 DIAGNOSIS — H04123 Dry eye syndrome of bilateral lacrimal glands: Secondary | ICD-10-CM | POA: Diagnosis not present

## 2018-07-06 DIAGNOSIS — Z947 Corneal transplant status: Secondary | ICD-10-CM | POA: Diagnosis not present

## 2018-07-06 DIAGNOSIS — D3131 Benign neoplasm of right choroid: Secondary | ICD-10-CM | POA: Diagnosis not present

## 2018-07-06 DIAGNOSIS — H1851 Endothelial corneal dystrophy: Secondary | ICD-10-CM | POA: Diagnosis not present

## 2018-08-19 ENCOUNTER — Ambulatory Visit (INDEPENDENT_AMBULATORY_CARE_PROVIDER_SITE_OTHER): Payer: Medicare Other | Admitting: *Deleted

## 2018-08-19 DIAGNOSIS — I442 Atrioventricular block, complete: Secondary | ICD-10-CM

## 2018-08-19 LAB — CUP PACEART REMOTE DEVICE CHECK
Battery Remaining Longevity: 114 mo
Battery Remaining Percentage: 95.5 %
Battery Voltage: 2.99 V
Brady Statistic AP VP Percent: 38 %
Brady Statistic AP VS Percent: 1 %
Brady Statistic AS VP Percent: 62 %
Brady Statistic AS VS Percent: 1 %
Brady Statistic RA Percent Paced: 38 %
Brady Statistic RV Percent Paced: 99 %
Date Time Interrogation Session: 20200812060013
Implantable Lead Implant Date: 20170803
Implantable Lead Implant Date: 20170803
Implantable Lead Location: 753859
Implantable Lead Location: 753860
Implantable Pulse Generator Implant Date: 20170803
Lead Channel Impedance Value: 360 Ohm
Lead Channel Impedance Value: 380 Ohm
Lead Channel Pacing Threshold Amplitude: 0.5 V
Lead Channel Pacing Threshold Amplitude: 0.75 V
Lead Channel Pacing Threshold Pulse Width: 0.5 ms
Lead Channel Pacing Threshold Pulse Width: 0.5 ms
Lead Channel Sensing Intrinsic Amplitude: 2 mV
Lead Channel Sensing Intrinsic Amplitude: 2.3 mV
Lead Channel Setting Pacing Amplitude: 1 V
Lead Channel Setting Pacing Amplitude: 2 V
Lead Channel Setting Pacing Pulse Width: 0.5 ms
Lead Channel Setting Sensing Sensitivity: 2 mV
Pulse Gen Model: 2272
Pulse Gen Serial Number: 7929478

## 2018-08-24 ENCOUNTER — Ambulatory Visit (INDEPENDENT_AMBULATORY_CARE_PROVIDER_SITE_OTHER): Payer: Medicare Other | Admitting: Cardiology

## 2018-08-24 ENCOUNTER — Encounter: Payer: Self-pay | Admitting: Cardiology

## 2018-08-24 ENCOUNTER — Other Ambulatory Visit: Payer: Self-pay

## 2018-08-24 VITALS — BP 144/68 | HR 69 | Ht 61.0 in | Wt 140.6 lb

## 2018-08-24 DIAGNOSIS — I442 Atrioventricular block, complete: Secondary | ICD-10-CM

## 2018-08-24 DIAGNOSIS — I25810 Atherosclerosis of coronary artery bypass graft(s) without angina pectoris: Secondary | ICD-10-CM

## 2018-08-24 NOTE — Patient Instructions (Signed)
Medication Instructions:  Your physician recommends that you continue on your current medications as directed. Please refer to the Current Medication list given to you today.  * If you need a refill on your cardiac medications before your next appointment, please call your pharmacy.   Labwork: None ordered  Testing/Procedures: None ordered  Follow-Up: Your physician wants you to follow-up in: 1 year with Dr. Camnitz.  You will receive a reminder letter in the mail two months in advance. If you don't receive a letter, please call our office to schedule the follow-up appointment.  Thank you for choosing [facility]!!   Anaclara Acklin, RN [phone]        

## 2018-08-24 NOTE — Progress Notes (Signed)
Electrophysiology Office Note   Date:  08/24/2018   ID:  Diana Wood, Diana Wood 27-Jul-1932, MRN [id number]  PCP:  [name], MD  Cardiologist:  Johnsie Cancel Primary Electrophysiologist:  Tisheena Maguire Meredith Leeds, MD    No chief complaint on file.    History of Present Illness: Diana Wood is a 85 y.o. female who presents today for electrophysiology evaluation.   History of CAD s/p CABG 11/2006, h/o atypical chest pain felt due to neuropathy/sternotomy, HTN, HLD, uterine cancer, hypothyroidism, DVT, GERD, anemia. St. Jude pacemaker placed 08/10/15 for complete AV block.  Today, denies symptoms of palpitations, chest pain, shortness of breath, orthopnea, PND, lower extremity edema, claudication, dizziness, presyncope, syncope, bleeding, or neurologic sequela. The patient is tolerating medications without difficulties.  Overall she is feeling well.  She continues to have chest pain that is a stabbing pain in the left side of her chest that lasts for 1 second.  She understands that this is unlikely to be cardiac in nature.    Past Medical History:  Diagnosis Date  . Anemia   . CAD (coronary artery disease)    a. s/p CABG 2008.  [name] Chest pain   . Deep vein thrombosis ([place])   . Diabetes mellitus   . GERD (gastroesophageal reflux disease)   . Hiatal hernia   . Hypercholesterolemia   . Hyperlipidemia   . Hypertension   . Hypothyroid   . Obesity   . Pleural effusion   . Uterine cancer [facility])    Past Surgical History:  Procedure Laterality Date  . ABDOMINAL HYSTERECTOMY    . bilateral feet surgery    . bilateral knee surgeries    . CATARACT EXTRACTION, BILATERAL    . CORONARY ARTERY BYPASS GRAFT     x 4-11'08-Dr. Bartle([facility])  . endoscopic vein harvesting from both thighs     Gilford Raid MD  . EP IMPLANTABLE DEVICE N/A 08/10/2015   Procedure: Pacemaker Implant;  Surgeon: Joely Losier Meredith Leeds, MD;  Location: [place] [facility];  Service: Cardiovascular;  Laterality: N/A;  . excision cyst  debridement fusion distal interphalangeal joint ring finger  with Mini Acutrak screw 20 mm in length    . EYE SURGERY     corneal transplant -right  . hysterectomy  2000   dx of uterine cancer  . JOINT REPLACEMENT     right knee replacement-Dr. supple  . surgery on 2 fingers     r HAND  . TOTAL KNEE ARTHROPLASTY Left 03/16/2012   Procedure: TOTAL KNEE ARTHROPLASTY;  Surgeon: Gearlean Alf, MD;  Location: [facility];  Service: Orthopedics;  Laterality: Left;     Current Outpatient Medications  Medication Sig Dispense Refill  . amLODipine (NORVASC) 10 MG tablet Take 10 mg by mouth daily. At 1500    . carboxymethylcellulose (REFRESH PLUS) 0.5 % SOLN Place 1 drop into both eyes 2 (two) times daily.    . Cholecalciferol (VITAMIN D PO) Take 2,000 Units by mouth daily after breakfast.     . fenofibrate (TRICOR) 145 MG tablet Take 145 mg by mouth daily after breakfast.     . hydrALAZINE (APRESOLINE) 25 MG tablet Take 25 mg by mouth 3 (three) times daily. After breakfast, at 1500 and at bedtime    . hydrochlorothiazide (HYDRODIURIL) 12.5 MG tablet Take 12.5 mg by mouth daily after breakfast.     . levothyroxine (SYNTHROID, LEVOTHROID) 112 MCG tablet Take 112 mcg by mouth daily before breakfast.     . metFORMIN (GLUCOPHAGE)  500 MG tablet Take 1 tablet by mouth daily.     . nitroGLYCERIN (NITROSTAT) 0.4 MG SL tablet Place 1 tablet (0.4 mg total) under the tongue every 5 (five) minutes as needed for chest pain. 25 tablet 3  . Omega-3 Fatty Acids (FISH OIL PO) Take 1 tablet by mouth daily.    [name] omeprazole (PRILOSEC) 20 MG capsule Take 20 mg by mouth daily after breakfast.     . prednisoLONE acetate (PRED FORTE) 1 % ophthalmic suspension Place 1 drop into both eyes every morning.    . sertraline (ZOLOFT) 50 MG tablet Take 50 mg by mouth daily.     No current facility-administered medications for this visit.     Allergies:   Losartan and Tape   Social History:  The patient  reports that she has  never smoked. She has never used smokeless tobacco. She reports that she does not drink alcohol or use drugs.   Family History:  The patient's family history includes Blindness in her brother; Cancer in her mother; Diabetes in her mother.    ROS:  Please see the history of present illness.   Otherwise, review of systems is positive for none.   All other systems are reviewed and negative.   PHYSICAL EXAM: VS:  BP (!) 144/68   Pulse 69   Ht 5\' 1"  (1.549 m)   Wt 140 lb 9.6 oz (63.8 kg)   SpO2 98%   BMI 26.57 kg/m  , BMI Body mass index is 26.57 kg/m. GEN: Well nourished, well developed, in no acute distress  HEENT: normal  Neck: no JVD, carotid bruits, or masses Cardiac: RRR; no murmurs, rubs, or gallops,no edema  Respiratory:  clear to auscultation bilaterally, normal work of breathing GI: soft, nontender, nondistended, + BS MS: no deformity or atrophy  Skin: warm and dry, device site well healed Neuro:  Strength and sensation are intact Psych: euthymic mood, full affect  EKG:  EKG is ordered today. Personal review of the ekg ordered shows atrial sensed, ventricular paced  Personal review of the device interrogation today. Results in [place]: No results found for requested labs within last 8760 hours.    Lipid Panel     Component Value Date/Time   CHOL  11/14/2006 0110    145        ATP III CLASSIFICATION:  <200     mg/dL   Desirable  200-239  mg/dL   Borderline High  >=240    mg/dL   High   TRIG 118 11/14/2006 0110   HDL 38 (L) 11/14/2006 0110   CHOLHDL 3.8 11/14/2006 0110   VLDL 24 11/14/2006 0110   LDLCALC  11/14/2006 0110    83        Total Cholesterol/HDL:CHD Risk Coronary Heart Disease Risk Table                     Men   Women  1/2 Average Risk   3.4   3.3     Wt Readings from Last 3 Encounters:  08/24/18 140 lb 9.6 oz (63.8 kg)  03/02/18 151 lb 6.4 oz (68.7 kg)  08/18/17 152 lb 3.2 oz (69 kg)      Other studies Reviewed: Additional  studies/ records that were reviewed today include: Epic notes  ASSESSMENT AND PLAN:  1.  Complete AV block: Status post Saint Jude dual-chamber pacemaker implanted 08/10/2015.  Device functioning appropriately.  No changes.    2.  HTN: Today but usually closer to 130 at home.  No changes.  3. CAD: No current chest pain.  No changes.      Current medicines are reviewed at length with the patient today.   The patient does not have concerns regarding her medicines.  The following changes were made today: None  Labs/ tests ordered today include:  Orders Placed This Encounter  Procedures  . EKG 12-Lead     Disposition:   FU with Shirley Decamp 12 months  Signed, Lluvia Gwynne Meredith Leeds, MD  08/24/2018 2:34 PM     [place] [address] [phone] (office) [phone] (fax) [

## 2018-08-28 ENCOUNTER — Encounter: Payer: Self-pay | Admitting: Cardiology

## 2018-08-28 NOTE — Progress Notes (Signed)
Remote pacemaker transmission.   

## 2018-09-28 DIAGNOSIS — L728 Other follicular cysts of the skin and subcutaneous tissue: Secondary | ICD-10-CM | POA: Diagnosis not present

## 2018-09-28 DIAGNOSIS — D225 Melanocytic nevi of trunk: Secondary | ICD-10-CM | POA: Diagnosis not present

## 2018-09-28 DIAGNOSIS — L304 Erythema intertrigo: Secondary | ICD-10-CM | POA: Diagnosis not present

## 2018-09-28 DIAGNOSIS — L814 Other melanin hyperpigmentation: Secondary | ICD-10-CM | POA: Diagnosis not present

## 2018-09-28 DIAGNOSIS — B351 Tinea unguium: Secondary | ICD-10-CM | POA: Diagnosis not present

## 2018-09-28 DIAGNOSIS — D1801 Hemangioma of skin and subcutaneous tissue: Secondary | ICD-10-CM | POA: Diagnosis not present

## 2018-09-28 DIAGNOSIS — Z85828 Personal history of other malignant neoplasm of skin: Secondary | ICD-10-CM | POA: Diagnosis not present

## 2018-09-28 DIAGNOSIS — L821 Other seborrheic keratosis: Secondary | ICD-10-CM | POA: Diagnosis not present

## 2018-09-29 DIAGNOSIS — H1851 Endothelial corneal dystrophy: Secondary | ICD-10-CM | POA: Diagnosis not present

## 2018-09-29 DIAGNOSIS — M545 Low back pain: Secondary | ICD-10-CM | POA: Diagnosis not present

## 2018-09-29 DIAGNOSIS — R2689 Other abnormalities of gait and mobility: Secondary | ICD-10-CM | POA: Diagnosis not present

## 2018-09-29 DIAGNOSIS — I2510 Atherosclerotic heart disease of native coronary artery without angina pectoris: Secondary | ICD-10-CM | POA: Diagnosis not present

## 2018-09-29 DIAGNOSIS — I119 Hypertensive heart disease without heart failure: Secondary | ICD-10-CM | POA: Diagnosis not present

## 2018-09-29 DIAGNOSIS — E039 Hypothyroidism, unspecified: Secondary | ICD-10-CM | POA: Diagnosis not present

## 2018-09-29 DIAGNOSIS — Z950 Presence of cardiac pacemaker: Secondary | ICD-10-CM | POA: Diagnosis not present

## 2018-09-29 DIAGNOSIS — E119 Type 2 diabetes mellitus without complications: Secondary | ICD-10-CM | POA: Diagnosis not present

## 2018-10-08 DIAGNOSIS — Z23 Encounter for immunization: Secondary | ICD-10-CM | POA: Diagnosis not present

## 2018-10-08 DIAGNOSIS — E119 Type 2 diabetes mellitus without complications: Secondary | ICD-10-CM | POA: Diagnosis not present

## 2018-10-26 DIAGNOSIS — R208 Other disturbances of skin sensation: Secondary | ICD-10-CM | POA: Diagnosis not present

## 2018-10-26 DIAGNOSIS — D485 Neoplasm of uncertain behavior of skin: Secondary | ICD-10-CM | POA: Diagnosis not present

## 2018-11-10 DIAGNOSIS — H5212 Myopia, left eye: Secondary | ICD-10-CM | POA: Diagnosis not present

## 2018-11-10 DIAGNOSIS — Z961 Presence of intraocular lens: Secondary | ICD-10-CM | POA: Diagnosis not present

## 2018-11-10 DIAGNOSIS — H524 Presbyopia: Secondary | ICD-10-CM | POA: Diagnosis not present

## 2018-11-10 DIAGNOSIS — E119 Type 2 diabetes mellitus without complications: Secondary | ICD-10-CM | POA: Diagnosis not present

## 2018-11-10 DIAGNOSIS — H5201 Hypermetropia, right eye: Secondary | ICD-10-CM | POA: Diagnosis not present

## 2018-11-10 DIAGNOSIS — H52223 Regular astigmatism, bilateral: Secondary | ICD-10-CM | POA: Diagnosis not present

## 2018-11-10 DIAGNOSIS — Z9849 Cataract extraction status, unspecified eye: Secondary | ICD-10-CM | POA: Diagnosis not present

## 2018-11-18 ENCOUNTER — Ambulatory Visit (INDEPENDENT_AMBULATORY_CARE_PROVIDER_SITE_OTHER): Payer: Medicare Other | Admitting: *Deleted

## 2018-11-18 DIAGNOSIS — I442 Atrioventricular block, complete: Secondary | ICD-10-CM

## 2018-11-18 LAB — CUP PACEART REMOTE DEVICE CHECK
Battery Remaining Longevity: 113 mo
Battery Remaining Percentage: 95.5 %
Battery Voltage: 2.99 V
Brady Statistic AP VP Percent: 37 %
Brady Statistic AP VS Percent: 1 %
Brady Statistic AS VP Percent: 63 %
Brady Statistic AS VS Percent: 1 %
Brady Statistic RA Percent Paced: 37 %
Brady Statistic RV Percent Paced: 99 %
Date Time Interrogation Session: 20201111070013
Implantable Lead Implant Date: 20170803
Implantable Lead Implant Date: 20170803
Implantable Lead Location: 753859
Implantable Lead Location: 753860
Implantable Pulse Generator Implant Date: 20170803
Lead Channel Impedance Value: 350 Ohm
Lead Channel Impedance Value: 380 Ohm
Lead Channel Pacing Threshold Amplitude: 0.5 V
Lead Channel Pacing Threshold Amplitude: 0.875 V
Lead Channel Pacing Threshold Pulse Width: 0.5 ms
Lead Channel Pacing Threshold Pulse Width: 0.5 ms
Lead Channel Sensing Intrinsic Amplitude: 2.1 mV
Lead Channel Sensing Intrinsic Amplitude: 9.5 mV
Lead Channel Setting Pacing Amplitude: 1.125
Lead Channel Setting Pacing Amplitude: 2 V
Lead Channel Setting Pacing Pulse Width: 0.5 ms
Lead Channel Setting Sensing Sensitivity: 2 mV
Pulse Gen Model: 2272
Pulse Gen Serial Number: 7929478

## 2018-12-10 NOTE — Progress Notes (Signed)
Remote pacemaker transmission.   

## 2019-01-18 LAB — CUP PACEART INCLINIC DEVICE CHECK
Date Time Interrogation Session: 20200817163353
Implantable Lead Implant Date: 20170803
Implantable Lead Implant Date: 20170803
Implantable Lead Location: 753859
Implantable Lead Location: 753860
Implantable Pulse Generator Implant Date: 20170803
Pulse Gen Model: 2272
Pulse Gen Serial Number: 7929478

## 2019-02-15 DIAGNOSIS — Z9181 History of falling: Secondary | ICD-10-CM | POA: Insufficient documentation

## 2019-02-15 DIAGNOSIS — R269 Unspecified abnormalities of gait and mobility: Secondary | ICD-10-CM | POA: Insufficient documentation

## 2019-02-17 ENCOUNTER — Ambulatory Visit (INDEPENDENT_AMBULATORY_CARE_PROVIDER_SITE_OTHER): Payer: Medicare Other | Admitting: *Deleted

## 2019-02-17 DIAGNOSIS — I442 Atrioventricular block, complete: Secondary | ICD-10-CM

## 2019-02-17 LAB — CUP PACEART REMOTE DEVICE CHECK
Battery Remaining Longevity: 114 mo
Battery Remaining Percentage: 95.5 %
Battery Voltage: 2.98 V
Brady Statistic AP VP Percent: 37 %
Brady Statistic AP VS Percent: 1 %
Brady Statistic AS VP Percent: 63 %
Brady Statistic AS VS Percent: 1 %
Brady Statistic RA Percent Paced: 37 %
Brady Statistic RV Percent Paced: 99 %
Date Time Interrogation Session: 20210210020013
Implantable Lead Implant Date: 20170803
Implantable Lead Implant Date: 20170803
Implantable Lead Location: 753859
Implantable Lead Location: 753860
Implantable Pulse Generator Implant Date: 20170803
Lead Channel Impedance Value: 380 Ohm
Lead Channel Impedance Value: 380 Ohm
Lead Channel Pacing Threshold Amplitude: 0.5 V
Lead Channel Pacing Threshold Amplitude: 0.75 V
Lead Channel Pacing Threshold Pulse Width: 0.5 ms
Lead Channel Pacing Threshold Pulse Width: 0.5 ms
Lead Channel Sensing Intrinsic Amplitude: 2.7 mV
Lead Channel Sensing Intrinsic Amplitude: 2.8 mV
Lead Channel Setting Pacing Amplitude: 1 V
Lead Channel Setting Pacing Amplitude: 2 V
Lead Channel Setting Pacing Pulse Width: 0.5 ms
Lead Channel Setting Sensing Sensitivity: 2 mV
Pulse Gen Model: 2272
Pulse Gen Serial Number: 7929478

## 2019-02-17 NOTE — Progress Notes (Signed)
PPM Remote  

## 2019-03-01 NOTE — Progress Notes (Signed)
Cardiology Office Note    Date:  03/05/2019  ID:  Diana Wood, Diana Wood 1932-10-05, MRN [id number] PCP:  [name], MD  Cardiologist:  Dr. Johnsie Cancel    Chief Complaint: None f/u CAD and AV block   History of Present Illness:   86 y.o. history of CABG 11/2006, HTN, HLD, Hypothyroidism, DVT, GERD and history of uterine cancer Last myovue normal 02/22/16  Developed bradycardia and had a MRI compatible  PPM placed by Dr Curt Bears August 2017 Edison Nasuti for thyroid and TSH suppressed August 2018 and dose synthroid reduced  Having some stress with her husband who seems to be more short tempered Had low K and TIA like symptoms beginning of 2018 resolved   Dr Curt Bears note from 08/18/17 indicated CHB underlying rhythm with 100% V pacing  No cardiac issues. Had her grand daughter Eduard Clos with her today who Is a year old   Getting 2nd COVID shot first week in March     Past Medical History:  Diagnosis Date  . Anemia   . CAD (coronary artery disease)    a. s/p CABG 2008.  [name] Chest pain   . Deep vein thrombosis ([place])   . Diabetes mellitus   . GERD (gastroesophageal reflux disease)   . Hiatal hernia   . Hypercholesterolemia   . Hyperlipidemia   . Hypertension   . Hypothyroid   . Obesity   . Pleural effusion   . Uterine cancer [facility])     Past Surgical History:  Procedure Laterality Date  . ABDOMINAL HYSTERECTOMY    . bilateral feet surgery    . bilateral knee surgeries    . CATARACT EXTRACTION, BILATERAL    . CORONARY ARTERY BYPASS GRAFT     x 4-11'08-Dr. Bartle([facility])  . endoscopic vein harvesting from both thighs     Gilford Raid MD  . EP IMPLANTABLE DEVICE N/A 08/10/2015   Procedure: Pacemaker Implant;  Surgeon: Will Meredith Leeds, MD;  Location: [place] [facility];  Service: Cardiovascular;  Laterality: N/A;  . excision cyst debridement fusion distal interphalangeal joint ring finger  with Mini Acutrak screw 20 mm in length    . EYE SURGERY     corneal transplant -right  .  hysterectomy  2000   dx of uterine cancer  . JOINT REPLACEMENT     right knee replacement-Dr. supple  . surgery on 2 fingers     r HAND  . TOTAL KNEE ARTHROPLASTY Left 03/16/2012   Procedure: TOTAL KNEE ARTHROPLASTY;  Surgeon: Gearlean Alf, MD;  [facility];  Service: Orthopedics;  Laterality: Left;    Current Medications: Current Outpatient Medications  Medication Sig Dispense Refill  . amLODipine (NORVASC) 10 MG tablet Take 10 mg by mouth daily. At 1500    . Cholecalciferol (VITAMIN D PO) Take 2,000 Units by mouth daily after breakfast.     . fenofibrate (TRICOR) 145 MG tablet Take 145 mg by mouth daily after breakfast.     . hydrALAZINE (APRESOLINE) 25 MG tablet Take 25 mg by mouth 3 (three) times daily. After breakfast, at 1500 and at bedtime    . hydrochlorothiazide (HYDRODIURIL) 12.5 MG tablet Take 12.5 mg by mouth daily after breakfast.     . levothyroxine (SYNTHROID, LEVOTHROID) 112 MCG tablet Take 112 mcg by mouth daily before breakfast.     . metFORMIN (GLUCOPHAGE) 500 MG tablet Take 1 tablet by mouth daily.     . Omega-3 Fatty Acids (FISH OIL PO) Take 1 tablet by  mouth daily.    [name] omeprazole (PRILOSEC) 20 MG capsule Take 20 mg by mouth daily after breakfast.     . prednisoLONE acetate (PRED FORTE) 1 % ophthalmic suspension Place 1 drop into both eyes every morning.    . sertraline (ZOLOFT) 50 MG tablet Take 50 mg by mouth daily.    . nitroGLYCERIN (NITROSTAT) 0.4 MG SL tablet Place 1 tablet (0.4 mg total) under the tongue every 5 (five) minutes as needed for chest pain. 25 tablet 3   No current facility-administered medications for this visit.     Allergies:   Losartan and Tape   Social History   Socioeconomic History  . Marital status: Married    Spouse name: Not on file  . Number of children: 3  . Years of education: Not on file  . Highest education level: Not on file  Occupational History  . Occupation: retired    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    Tobacco Use  . Smoking status: Never Smoker  . Smokeless tobacco: Never Used  Substance and Sexual Activity  . Alcohol use: No  . Drug use: No  . Sexual activity: Yes  Other Topics Concern  . Not on file  Social History Narrative  . Not on file   Social Determinants of Health   Financial Resource Strain:   . Difficulty of Paying Living Expenses: Not on file  Food Insecurity:   . Worried About Charity fundraiser in the Last Year: Not on file  . Ran Out of Food in the Last Year: Not on file  Transportation Needs:   . Lack of Transportation (Medical): Not on file  . Lack of Transportation (Non-Medical): Not on file  Physical Activity:   . Days of Exercise per Week: Not on file  . Minutes of Exercise per Session: Not on file  Stress:   . Feeling of Stress : Not on file  Social Connections:   . Frequency of Communication with Friends and Family: Not on file  . Frequency of Social Gatherings with Friends and Family: Not on file  . Attends Religious Services: Not on file  . Active Member of Clubs or Organizations: Not on file  . Attends Archivist Meetings: Not on file  . Marital Status: Not on file     Family History:  The patient's family history includes Blindness in her brother; Cancer in her mother; Diabetes in her mother.   ROS:   Please see the history of present illness.  All other systems are reviewed and otherwise negative.    PHYSICAL EXAM:   VS:  BP 132/88   Pulse 62   Ht 5\' 1"  (1.549 m)   Wt 141 lb (64 kg)   SpO2 98%   BMI 26.64 kg/m   BMI: Body mass index is 26.64 kg/m. Affect appropriate Healthy:  appears stated age 45: normal Neck supple with no adenopathy JVP normal no bruits no thyromegaly Lungs clear with no wheezing and good diaphragmatic motion Heart:  S1/S2 no murmur, no rub, gallop or click PMI normal pacer under right clavicle  Abdomen: benighn, BS positve, no tenderness, no AAA no bruit.  No HSM or HJR Distal pulses  intact with no bruits No edema Neuro non-focal Skin warm and dry No muscular weakness   Wt Readings from Last 3 Encounters:  03/05/19 141 lb (64 kg)  08/24/18 140 lb 9.6 oz (63.8 kg)  03/02/18 151 lb 6.4 oz (68.7 kg)  Studies/Labs Reviewed:   EKG:   08/04/15 CHB narrow complex escape 02/14/16  P synch pacing LBBB  03/05/17 AV paced with LBBB morphology   Recent Labs: No results found for requested labs within last 8760 hours.   Lipid Panel   Additional studies/ records that were reviewed today include: Summarized above.    ASSESSMENT & PLAN:   1. PPM:  V pacing 100% of time  f/u EP [place] St Jude device MRI compatible  2. CAD s/p CABG - non ischemic myovue 02/22/16 EF 66%  3. Chol:  Labs followed by Virgina Jock on fibrate not sure why not on statin 4. Thyroid:  Synthroid dose reduced f/u labs with Quin Hoop

## 2019-03-05 ENCOUNTER — Other Ambulatory Visit: Payer: Self-pay

## 2019-03-05 ENCOUNTER — Encounter: Payer: Self-pay | Admitting: Cardiovascular Disease

## 2019-03-05 ENCOUNTER — Ambulatory Visit (INDEPENDENT_AMBULATORY_CARE_PROVIDER_SITE_OTHER): Payer: Medicare Other | Admitting: Cardiovascular Disease

## 2019-03-05 VITALS — BP 132/88 | HR 62 | Ht 61.0 in | Wt 141.0 lb

## 2019-03-05 DIAGNOSIS — Z951 Presence of aortocoronary bypass graft: Secondary | ICD-10-CM

## 2019-03-05 NOTE — Patient Instructions (Signed)
Medication Instructions:  *If you need a refill on your cardiac medications before your next appointment, please call your pharmacy*  Lab Work: If you have labs (blood work) drawn today and your tests are completely normal, you will receive your results only by: . MyChart Message (if you have MyChart) OR . A paper copy in the mail If you have any lab test that is abnormal or we need to change your treatment, we will call you to review the results.  Follow-Up: At [facility], you and your health needs are our priority.  As part of our continuing mission to provide you with exceptional heart care, we have created designated Provider Care Teams.  These Care Teams include your primary Cardiologist (physician) and Advanced Practice Providers (APPs -  Physician Assistants and Nurse Practitioners) who all work together to provide you with the care you need, when you need it.  We recommend signing up for the patient portal called "MyChart".  Sign up information is provided on this After Visit Summary.  MyChart is used to connect with patients for Virtual Visits (Telemedicine).  Patients are able to view lab/test results, encounter notes, upcoming appointments, etc.  Non-urgent messages can be sent to your provider as well.   To learn more about what you can do with MyChart, go to [email or web address].    Your next appointment:   6 month(s)  The format for your next appointment:   In Person  Provider:   You may see Dr. Nishan or one of the following Advanced Practice Providers on your designated Care Team:    Lori Gerhardt, NP  Laura Ingold, NP  Jill McDaniel, NP   

## 2019-03-26 DIAGNOSIS — E119 Type 2 diabetes mellitus without complications: Secondary | ICD-10-CM | POA: Diagnosis not present

## 2019-03-26 DIAGNOSIS — E038 Other specified hypothyroidism: Secondary | ICD-10-CM | POA: Diagnosis not present

## 2019-03-26 DIAGNOSIS — E7849 Other hyperlipidemia: Secondary | ICD-10-CM | POA: Diagnosis not present

## 2019-03-26 DIAGNOSIS — M859 Disorder of bone density and structure, unspecified: Secondary | ICD-10-CM | POA: Diagnosis not present

## 2019-03-29 DIAGNOSIS — D225 Melanocytic nevi of trunk: Secondary | ICD-10-CM | POA: Diagnosis not present

## 2019-03-29 DIAGNOSIS — L820 Inflamed seborrheic keratosis: Secondary | ICD-10-CM | POA: Diagnosis not present

## 2019-03-29 DIAGNOSIS — D1801 Hemangioma of skin and subcutaneous tissue: Secondary | ICD-10-CM | POA: Diagnosis not present

## 2019-03-29 DIAGNOSIS — L814 Other melanin hyperpigmentation: Secondary | ICD-10-CM | POA: Diagnosis not present

## 2019-03-29 DIAGNOSIS — R208 Other disturbances of skin sensation: Secondary | ICD-10-CM | POA: Diagnosis not present

## 2019-03-29 DIAGNOSIS — L821 Other seborrheic keratosis: Secondary | ICD-10-CM | POA: Diagnosis not present

## 2019-03-29 DIAGNOSIS — L570 Actinic keratosis: Secondary | ICD-10-CM | POA: Diagnosis not present

## 2019-04-02 DIAGNOSIS — Z950 Presence of cardiac pacemaker: Secondary | ICD-10-CM | POA: Diagnosis not present

## 2019-04-02 DIAGNOSIS — R2689 Other abnormalities of gait and mobility: Secondary | ICD-10-CM | POA: Diagnosis not present

## 2019-04-02 DIAGNOSIS — E119 Type 2 diabetes mellitus without complications: Secondary | ICD-10-CM | POA: Diagnosis not present

## 2019-04-02 DIAGNOSIS — I2510 Atherosclerotic heart disease of native coronary artery without angina pectoris: Secondary | ICD-10-CM | POA: Diagnosis not present

## 2019-04-02 DIAGNOSIS — D509 Iron deficiency anemia, unspecified: Secondary | ICD-10-CM | POA: Diagnosis not present

## 2019-04-02 DIAGNOSIS — Z9181 History of falling: Secondary | ICD-10-CM | POA: Diagnosis not present

## 2019-04-02 DIAGNOSIS — M545 Low back pain: Secondary | ICD-10-CM | POA: Diagnosis not present

## 2019-04-02 DIAGNOSIS — M1990 Unspecified osteoarthritis, unspecified site: Secondary | ICD-10-CM | POA: Diagnosis not present

## 2019-04-02 DIAGNOSIS — Z0000 Encounter for general adult medical examination without abnormal findings: Secondary | ICD-10-CM | POA: Diagnosis not present

## 2019-04-02 DIAGNOSIS — M8580 Other specified disorders of bone density and structure, unspecified site: Secondary | ICD-10-CM | POA: Diagnosis not present

## 2019-04-02 DIAGNOSIS — R82998 Other abnormal findings in urine: Secondary | ICD-10-CM | POA: Diagnosis not present

## 2019-04-02 DIAGNOSIS — E876 Hypokalemia: Secondary | ICD-10-CM | POA: Diagnosis not present

## 2019-05-19 ENCOUNTER — Ambulatory Visit (INDEPENDENT_AMBULATORY_CARE_PROVIDER_SITE_OTHER): Payer: Medicare Other | Admitting: *Deleted

## 2019-05-19 DIAGNOSIS — I442 Atrioventricular block, complete: Secondary | ICD-10-CM | POA: Diagnosis not present

## 2019-05-19 LAB — CUP PACEART REMOTE DEVICE CHECK
Battery Remaining Longevity: 115 mo
Battery Remaining Percentage: 95.5 %
Battery Voltage: 2.98 V
Brady Statistic AP VP Percent: 38 %
Brady Statistic AP VS Percent: 1 %
Brady Statistic AS VP Percent: 61 %
Brady Statistic AS VS Percent: 1 %
Brady Statistic RA Percent Paced: 38 %
Brady Statistic RV Percent Paced: 99 %
Date Time Interrogation Session: 20210512021930
Implantable Lead Implant Date: 20170803
Implantable Lead Implant Date: 20170803
Implantable Lead Location: 753859
Implantable Lead Location: 753860
Implantable Pulse Generator Implant Date: 20170803
Lead Channel Impedance Value: 380 Ohm
Lead Channel Impedance Value: 440 Ohm
Lead Channel Pacing Threshold Amplitude: 0.5 V
Lead Channel Pacing Threshold Amplitude: 0.75 V
Lead Channel Pacing Threshold Pulse Width: 0.5 ms
Lead Channel Pacing Threshold Pulse Width: 0.5 ms
Lead Channel Sensing Intrinsic Amplitude: 2.1 mV
Lead Channel Sensing Intrinsic Amplitude: 2.2 mV
Lead Channel Setting Pacing Amplitude: 1 V
Lead Channel Setting Pacing Amplitude: 2 V
Lead Channel Setting Pacing Pulse Width: 0.5 ms
Lead Channel Setting Sensing Sensitivity: 2 mV
Pulse Gen Model: 2272
Pulse Gen Serial Number: 7929478

## 2019-05-20 NOTE — Progress Notes (Signed)
Remote pacemaker transmission.   

## 2019-06-14 DIAGNOSIS — M47816 Spondylosis without myelopathy or radiculopathy, lumbar region: Secondary | ICD-10-CM | POA: Diagnosis not present

## 2019-06-14 DIAGNOSIS — M47896 Other spondylosis, lumbar region: Secondary | ICD-10-CM | POA: Diagnosis not present

## 2019-06-21 DIAGNOSIS — M25512 Pain in left shoulder: Secondary | ICD-10-CM | POA: Diagnosis not present

## 2019-06-27 DIAGNOSIS — M47816 Spondylosis without myelopathy or radiculopathy, lumbar region: Secondary | ICD-10-CM | POA: Insufficient documentation

## 2019-07-30 DIAGNOSIS — M47816 Spondylosis without myelopathy or radiculopathy, lumbar region: Secondary | ICD-10-CM | POA: Diagnosis not present

## 2019-07-30 DIAGNOSIS — M5136 Other intervertebral disc degeneration, lumbar region: Secondary | ICD-10-CM | POA: Diagnosis not present

## 2019-08-18 ENCOUNTER — Ambulatory Visit (INDEPENDENT_AMBULATORY_CARE_PROVIDER_SITE_OTHER): Payer: Medicare Other | Admitting: *Deleted

## 2019-08-18 DIAGNOSIS — I442 Atrioventricular block, complete: Secondary | ICD-10-CM

## 2019-08-19 LAB — CUP PACEART REMOTE DEVICE CHECK
Battery Remaining Longevity: 114 mo
Battery Remaining Percentage: 95.5 %
Battery Voltage: 2.98 V
Brady Statistic AP VP Percent: 41 %
Brady Statistic AP VS Percent: 1 %
Brady Statistic AS VP Percent: 59 %
Brady Statistic AS VS Percent: 1 %
Brady Statistic RA Percent Paced: 41 %
Brady Statistic RV Percent Paced: 99 %
Date Time Interrogation Session: 20210811050227
Implantable Lead Implant Date: 20170803
Implantable Lead Implant Date: 20170803
Implantable Lead Location: 753859
Implantable Lead Location: 753860
Implantable Pulse Generator Implant Date: 20170803
Lead Channel Impedance Value: 360 Ohm
Lead Channel Impedance Value: 400 Ohm
Lead Channel Pacing Threshold Amplitude: 0.5 V
Lead Channel Pacing Threshold Amplitude: 0.875 V
Lead Channel Pacing Threshold Pulse Width: 0.5 ms
Lead Channel Pacing Threshold Pulse Width: 0.5 ms
Lead Channel Sensing Intrinsic Amplitude: 1.5 mV
Lead Channel Sensing Intrinsic Amplitude: 7.2 mV
Lead Channel Setting Pacing Amplitude: 1.125
Lead Channel Setting Pacing Amplitude: 2 V
Lead Channel Setting Pacing Pulse Width: 0.5 ms
Lead Channel Setting Sensing Sensitivity: 2 mV
Pulse Gen Model: 2272
Pulse Gen Serial Number: 7929478

## 2019-08-23 NOTE — Progress Notes (Signed)
Remote pacemaker transmission.   

## 2019-08-24 ENCOUNTER — Ambulatory Visit (INDEPENDENT_AMBULATORY_CARE_PROVIDER_SITE_OTHER): Payer: Medicare Other | Admitting: Cardiology

## 2019-08-24 ENCOUNTER — Other Ambulatory Visit: Payer: Self-pay

## 2019-08-24 ENCOUNTER — Encounter: Payer: Self-pay | Admitting: Cardiology

## 2019-08-24 VITALS — BP 136/74 | HR 71 | Ht 61.0 in | Wt 141.2 lb

## 2019-08-24 DIAGNOSIS — I442 Atrioventricular block, complete: Secondary | ICD-10-CM

## 2019-08-24 DIAGNOSIS — M533 Sacrococcygeal disorders, not elsewhere classified: Secondary | ICD-10-CM | POA: Diagnosis not present

## 2019-08-24 DIAGNOSIS — M5136 Other intervertebral disc degeneration, lumbar region: Secondary | ICD-10-CM | POA: Diagnosis not present

## 2019-08-24 DIAGNOSIS — M47896 Other spondylosis, lumbar region: Secondary | ICD-10-CM | POA: Diagnosis not present

## 2019-08-24 DIAGNOSIS — M545 Low back pain: Secondary | ICD-10-CM | POA: Diagnosis not present

## 2019-08-24 NOTE — Progress Notes (Signed)
Electrophysiology Office Note   Date:  08/24/2019   ID:  Belkis, Norbeck May 18, 1932, MRN [id number]  PCP:  [name], MD  Cardiologist:  Johnsie Cancel Primary Electrophysiologist:  Yareni Creps Meredith Leeds, MD    No chief complaint on file.    History of Present Illness: Diana Wood is a 86 y.o. female who presents today for electrophysiology evaluation.   She has a history of coronary artery disease status post CABG in 2008, hypertension, hyperlipidemia, uterine cancer, hypothyroidism, DVT, GERD.  She presented to the hospital with complete heart block and is now status post [place] dual-chamber pacemaker implanted 08/10/2015.     Today, denies symptoms of palpitations, chest pain, shortness of breath, orthopnea, PND, lower extremity edema, claudication, dizziness, presyncope, syncope, bleeding, or neurologic sequela. The patient is tolerating medications without difficulties.  She is overall doing well.  She has no chest pain or shortness of breath.  She is having issues with falling, though she feels that she simply loses her balance.  She is walking with a cane today.  There is nothing on her pacemaker that would indicate a cause for her falls.  Otherwise she is doing well.     Past Medical History:  Diagnosis Date   Anemia    CAD (coronary artery disease)    a. s/p CABG 2008.   Chest pain    Deep vein thrombosis ([facility])    Diabetes mellitus    GERD (gastroesophageal reflux disease)    Hiatal hernia    Hypercholesterolemia    Hyperlipidemia    Hypertension    Hypothyroid    Obesity    Pleural effusion    Uterine cancer ([facility])    Past Surgical History:  Procedure Laterality Date   ABDOMINAL HYSTERECTOMY     bilateral feet surgery     bilateral knee surgeries     CATARACT EXTRACTION, BILATERAL     CORONARY ARTERY BYPASS GRAFT     x 4-11'08-Dr. Bartle([facility])   endoscopic vein harvesting from both thighs     Gilford Raid MD   EP IMPLANTABLE DEVICE N/A  08/10/2015   Procedure: Pacemaker Implant;  Surgeon: Coreena Rubalcava Meredith Leeds, MD;  Location: [place] [facility];  Service: Cardiovascular;  Laterality: N/A;   excision cyst debridement fusion distal interphalangeal joint ring finger  with Mini Acutrak screw 20 mm in length     EYE SURGERY     corneal transplant -right   hysterectomy  2000   dx of uterine cancer   JOINT REPLACEMENT     right knee replacement-Dr. supple   surgery on 2 fingers     r HAND   TOTAL KNEE ARTHROPLASTY Left 03/16/2012   Procedure: TOTAL KNEE ARTHROPLASTY;  Surgeon: Gearlean Alf, MD;  [facility];  Service: Orthopedics;  Laterality: Left;     Current Outpatient Medications  Medication Sig Dispense Refill   Accu-Chek Softclix Lancets lancets 1 each by Other route as needed.     amLODipine (NORVASC) 10 MG tablet Take 10 mg by mouth daily. At 1500     Cholecalciferol (VITAMIN D PO) Take 2,000 Units by mouth daily after breakfast.      fenofibrate (TRICOR) 145 MG tablet Take 145 mg by mouth daily after breakfast.      hydrALAZINE (APRESOLINE) 25 MG tablet Take 25 mg by mouth 3 (three) times daily. After breakfast, at 1500 and at bedtime     hydrochlorothiazide (HYDRODIURIL) 12.5 MG tablet Take 12.5 mg by mouth daily  after breakfast.      levothyroxine (SYNTHROID, LEVOTHROID) 112 MCG tablet Take 112 mcg by mouth daily before breakfast.      metFORMIN (GLUCOPHAGE) 500 MG tablet Take 1 tablet by mouth daily.      nitroGLYCERIN (NITROSTAT) 0.4 MG SL tablet Place 1 tablet (0.4 mg total) under the tongue every 5 (five) minutes as needed for chest pain. 25 tablet 3   Omega-3 Fatty Acids (FISH OIL PO) Take 1 tablet by mouth daily.     omeprazole (PRILOSEC) 20 MG capsule Take 20 mg by mouth daily after breakfast.      prednisoLONE acetate (PRED FORTE) 1 % ophthalmic suspension Place 1 drop into both eyes every morning.     sertraline (ZOLOFT) 50 MG tablet Take 50 mg by mouth daily.     traMADol (ULTRAM)  50 MG tablet Take 1 tablet by mouth 3 (three) times daily as needed. Take 1 tablet 3 times a day by oral route as needed for pain for 5 days.     TRUE METRIX BLOOD GLUCOSE TEST test strip 1 each by Other route daily.     No current facility-administered medications for this visit.    Allergies:   Losartan and Tape   Social History:  The patient  reports that she has never smoked. She has never used smokeless tobacco. She reports that she does not drink alcohol and does not use drugs.   Family History:  The patient's family history includes Blindness in her brother; Cancer in her mother; Diabetes in her mother.   ROS:  Please see the history of present illness.   Otherwise, review of systems is positive for none.   All other systems are reviewed and negative.   PHYSICAL EXAM: VS:  BP 136/74    Pulse 71    Ht 5\' 1"  (1.549 m)    Wt 141 lb 3.2 oz (64 kg)    SpO2 97%    BMI 26.68 kg/m  , BMI Body mass index is 26.68 kg/m. GEN: Well nourished, well developed, in no acute distress  HEENT: normal  Neck: no JVD, carotid bruits, or masses Cardiac: RRR; no murmurs, rubs, or gallops,no edema  Respiratory:  clear to auscultation bilaterally, normal work of breathing GI: soft, nontender, nondistended, + BS MS: no deformity or atrophy  Skin: warm and dry, device site well healed Neuro:  Strength and sensation are intact Psych: euthymic mood, full affect  EKG:  EKG is ordered today. Personal review of the ekg ordered shows atrial sensed, ventricular paced  Personal review of the device interrogation today. Results in [place]: No results found for requested labs within last 8760 hours.    Lipid Panel     Component Value Date/Time   CHOL  11/14/2006 0110    145        ATP III CLASSIFICATION:  <200     mg/dL   Desirable  200-239  mg/dL   Borderline High  >=240    mg/dL   High   TRIG 118 11/14/2006 0110   HDL 38 (L) 11/14/2006 0110   CHOLHDL 3.8 11/14/2006 0110   VLDL 24  11/14/2006 0110   LDLCALC  11/14/2006 0110    83        Total Cholesterol/HDL:CHD Risk Coronary Heart Disease Risk Table                     Men   Women  1/2 Average Risk  3.4   3.3     Wt Readings from Last 3 Encounters:  08/24/19 141 lb 3.2 oz (64 kg)  03/05/19 141 lb (64 kg)  08/24/18 140 lb 9.6 oz (63.8 kg)      Other studies Reviewed: Additional studies/ records that were reviewed today include: Epic notes  ASSESSMENT AND PLAN:  1.  Complete AV block: Status post Saint Jude dual-chamber pacemaker implanted 03/27/2015.  Device functioning appropriately.  No changes.    2. HTN: Currently well controlled  3. CAD: No current chest pain.  No changes.    Current medicines are reviewed at length with the patient today.   The patient does not have concerns regarding her medicines.  The following changes were made today: none  Labs/ tests ordered today include:  Orders Placed This Encounter  Procedures   EKG 12-Lead     Disposition:   FU with Erinn Huskins 1shoe2 months  Signed, Asjia Berrios Meredith Leeds, MD  08/24/2019 2:15 PM     [place] [address] [place] [address] [phone] (office) [phone] (fax) [

## 2019-08-24 NOTE — Patient Instructions (Signed)
Medication Instructions:  Your physician recommends that you continue on your current medications as directed. Please refer to the Current Medication list given to you today.  *If you need a refill on your cardiac medications before your next appointment, please call your pharmacy*   Lab Work: None ordered If you have labs (blood work) drawn today and your tests are completely normal, you will receive your results only by: [name] MyChart Message (if you have MyChart) OR . A paper copy in the mail If you have any lab test that is abnormal or we need to change your treatment, we will call you to review the results.   Testing/Procedures: None ordered   Follow-Up: Remote monitoring is used to monitor your Pacemaker of ICD from home. This monitoring reduces the number of office visits required to check your device to one time per year. It allows [place] to keep an eye on the functioning of your device to ensure it is working properly. You are scheduled for a device check from home on 11/17/2019. You may send your transmission at any time that day. If you have a wireless device, the transmission will be sent automatically. After your physician reviews your transmission, you will receive a postcard with your next transmission date.  At [facility], you and your health needs are our priority.  As part of our continuing mission to provide you with exceptional heart care, we have created designated Provider Care Teams.  These Care Teams include your primary Cardiologist (physician) and Advanced Practice Providers (APPs -  Physician Assistants and Nurse Practitioners) who all work together to provide you with the care you need, when you need it.  We recommend signing up for the patient portal called "MyChart".  Sign up information is provided on this After Visit Summary.  MyChart is used to connect with patients for Virtual Visits (Telemedicine).  Patients are able to view lab/test results, encounter notes,  upcoming appointments, etc.  Non-urgent messages can be sent to your provider as well.   To learn more about what you can do with MyChart, go to [email or web address].    Your next appointment:   1 year(s)  The format for your next appointment:   In Person  Provider:   Allegra Lai, MD   Thank you for choosing [facility]!!   Trinidad Curet, RN [phone]    Other Instructions

## 2019-09-23 DIAGNOSIS — M533 Sacrococcygeal disorders, not elsewhere classified: Secondary | ICD-10-CM | POA: Diagnosis not present

## 2019-10-04 NOTE — Progress Notes (Signed)
Cardiology Office Note    Date:  10/13/2019  ID:  Diana Wood, Diana Wood [birth year], MRN [id number] PCP:  [name], MD  Cardiologist:  Dr. Johnsie Cancel    Chief Complaint: None f/u CAD and AV block   History of Present Illness:   86 y.o. history of CABG 11/2006, HTN, HLD, Hypothyroidism, DVT, GERD and history of uterine cancer Last myovue normal 02/22/16  Developed bradycardia and had a MRI compatible  PPM placed by Dr Curt Bears August 2017 Edison Nasuti for thyroid and TSH suppressed August 2018 and dose synthroid reduced  Having some stress with her husband who seems to be more short tempered   No cardiac issues. [place] daughter Charlie 59 years old now   Seen by Dr Curt Bears 08/24/19 normal pacer function having some balance issues and falling  Her pacer is under the right clavicle for some reason    Past Medical History:  Diagnosis Date  . Anemia   . CAD (coronary artery disease)    a. s/p CABG 2008.  [name] Chest pain   . Deep vein thrombosis ([place])   . Diabetes mellitus   . GERD (gastroesophageal reflux disease)   . Hiatal hernia   . Hypercholesterolemia   . Hyperlipidemia   . Hypertension   . Hypothyroid   . Obesity   . Pleural effusion   . Uterine cancer [facility])     Past Surgical History:  Procedure Laterality Date  . ABDOMINAL HYSTERECTOMY    . bilateral feet surgery    . bilateral knee surgeries    . CATARACT EXTRACTION, BILATERAL    . CORONARY ARTERY BYPASS GRAFT     x 4-11'08-Dr. Bartle([facility])  . endoscopic vein harvesting from both thighs     Gilford Raid MD  . EP IMPLANTABLE DEVICE N/A 08/10/2015   Procedure: Pacemaker Implant;  Surgeon: Will Meredith Leeds, MD;  Location: [place] [facility];  Service: Cardiovascular;  Laterality: N/A;  . excision cyst debridement fusion distal interphalangeal joint ring finger  with Mini Acutrak screw 20 mm in length    . EYE SURGERY     corneal transplant -right  . hysterectomy  2000   dx of uterine cancer  . JOINT REPLACEMENT      right knee replacement-Dr. supple  . surgery on 2 fingers     r HAND  . TOTAL KNEE ARTHROPLASTY Left 03/16/2012   Procedure: TOTAL KNEE ARTHROPLASTY;  Surgeon: Gearlean Alf, MD;  Location: [facility];  Service: Orthopedics;  Laterality: Left;    Current Medications: Current Outpatient Medications  Medication Sig Dispense Refill  . Accu-Chek Softclix Lancets lancets 1 each by Other route as needed.    [name] amLODipine (NORVASC) 10 MG tablet Take 10 mg by mouth daily. At 1500    . Cholecalciferol (VITAMIN D PO) Take 2,000 Units by mouth daily after breakfast.     . fenofibrate (TRICOR) 145 MG tablet Take 145 mg by mouth daily after breakfast.     . hydrALAZINE (APRESOLINE) 25 MG tablet Take 25 mg by mouth 3 (three) times daily. After breakfast, at 1500 and at bedtime    . hydrochlorothiazide (HYDRODIURIL) 12.5 MG tablet Take 12.5 mg by mouth daily after breakfast.     . levothyroxine (SYNTHROID, LEVOTHROID) 112 MCG tablet Take 112 mcg by mouth daily before breakfast.     . metFORMIN (GLUCOPHAGE) 500 MG tablet Take 1 tablet by mouth daily.     . Omega-3 Fatty Acids (FISH OIL PO) Take 1 tablet by  mouth daily.    [name] omeprazole (PRILOSEC) 20 MG capsule Take 20 mg by mouth daily after breakfast.     . prednisoLONE acetate (PRED FORTE) 1 % ophthalmic suspension Place 1 drop into both eyes every morning.    . sertraline (ZOLOFT) 50 MG tablet Take 50 mg by mouth daily.    . traMADol (ULTRAM) 50 MG tablet Take 1 tablet by mouth 3 (three) times daily as needed. Take 1 tablet 3 times a day by oral route as needed for pain for 5 days.    . TRUE METRIX BLOOD GLUCOSE TEST test strip 1 each by Other route daily.    . nitroGLYCERIN (NITROSTAT) 0.4 MG SL tablet Place 1 tablet (0.4 mg total) under the tongue every 5 (five) minutes as needed for chest pain. 25 tablet 3   No current facility-administered medications for this visit.     Allergies:   Losartan and Tape   Social History   Socioeconomic History  .  Marital status: Married    Spouse name: Not on file  . Number of children: 3  . Years of education: Not on file  . Highest education level: Not on file  Occupational History  . 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 Smoking status: Never Smoker  . Smokeless tobacco: Never Used  Vaping Use  . Vaping Use: Never used  Substance and Sexual Activity  . Alcohol use: No  . Drug use: No  . Sexual activity: Yes  Other Topics Concern  . Not on file  Social History Narrative  . Not on file   Social Determinants of Health   Financial Resource Strain:   . Difficulty of Paying Living Expenses: Not on file  Food Insecurity:   . Worried About Charity fundraiser in the Last Year: Not on file  . Ran Out of Food in the Last Year: Not on file  Transportation Needs:   . Lack of Transportation (Medical): Not on file  . Lack of Transportation (Non-Medical): Not on file  Physical Activity:   . Days of Exercise per Week: Not on file  . Minutes of Exercise per Session: Not on file  Stress:   . Feeling of Stress : Not on file  Social Connections:   . Frequency of Communication with Friends and Family: Not on file  . Frequency of Social Gatherings with Friends and Family: Not on file  . Attends Religious Services: Not on file  . Active Member of Clubs or Organizations: Not on file  . Attends Archivist Meetings: Not on file  . Marital Status: Not on file     Family History:  The patient's family history includes Blindness in her brother; Cancer in her mother; Diabetes in her mother.   ROS:   Please see the history of present illness.  All other systems are reviewed and otherwise negative.    PHYSICAL EXAM:   VS:  BP 140/70   Pulse 89   Ht 5\' 1"  (1.549 m)   Wt 138 lb 12.8 oz (63 kg)   SpO2 96%   BMI 26.23 kg/m   BMI: Body mass index is 26.23 kg/m. Affect appropriate Healthy:  appears stated age 14: normal Neck supple with no  adenopathy JVP normal no bruits no thyromegaly Lungs clear with no wheezing and good diaphragmatic motion Heart:  S1/S2 no murmur, no rub, gallop or click PMI normal pacer under right clavicle  Abdomen: benighn, BS positve, no tenderness, no AAA no bruit.  No HSM or HJR Distal pulses intact with no bruits No edema Neuro non-focal Skin warm and dry No muscular weakness   Wt Readings from Last 3 Encounters:  10/13/19 138 lb 12.8 oz (63 kg)  08/24/19 141 lb 3.2 oz (64 kg)  03/05/19 141 lb (64 kg)      Studies/Labs Reviewed:   EKG:   08/04/15 CHB narrow complex escape 02/14/16  P synch pacing LBBB  03/05/17 AV paced with LBBB morphology   Recent Labs: No results found for requested labs within last 8760 hours.   Lipid Panel   Additional studies/ records that were reviewed today include: Summarized above.    ASSESSMENT & PLAN:   1. PPM:  V pacing 100% of time  f/u EP Camnitz St Jude device MRI compatible  2. CAD s/p CABG - non ischemic myovue 02/22/16 EF 66%  New nitro called in  3. Chol:  Labs followed by Virgina Jock on fibrate not sure why not on statin 4. Thyroid:  Synthroid dose reduced f/u labs with Virgina Jock Lab Results  Component Value Date   TSH 0.155 (L) 08/27/2016  5. DM:  Discussed low carb diet.  Target hemoglobin A1c is 6.5 or less.  Continue current medications. 6. Anxiety/Depression: on Zoloft mood seems appropriate    Jenkins Rouge

## 2019-10-06 DIAGNOSIS — M533 Sacrococcygeal disorders, not elsewhere classified: Secondary | ICD-10-CM | POA: Diagnosis not present

## 2019-10-13 ENCOUNTER — Ambulatory Visit (INDEPENDENT_AMBULATORY_CARE_PROVIDER_SITE_OTHER): Payer: Medicare Other | Admitting: Cardiovascular Disease

## 2019-10-13 ENCOUNTER — Other Ambulatory Visit: Payer: Self-pay

## 2019-10-13 ENCOUNTER — Encounter: Payer: Self-pay | Admitting: Cardiovascular Disease

## 2019-10-13 VITALS — BP 140/70 | HR 89 | Ht 61.0 in | Wt 138.8 lb

## 2019-10-13 DIAGNOSIS — Z951 Presence of aortocoronary bypass graft: Secondary | ICD-10-CM

## 2019-10-13 MED ORDER — NITROGLYCERIN 0.4 MG SL SUBL: 0.4000 mg | SUBLINGUAL_TABLET | SUBLINGUAL | 3 refills | Status: DC | PRN

## 2019-10-13 NOTE — Patient Instructions (Signed)

## 2019-11-03 DIAGNOSIS — M5136 Other intervertebral disc degeneration, lumbar region: Secondary | ICD-10-CM | POA: Diagnosis not present

## 2019-11-05 DIAGNOSIS — D509 Iron deficiency anemia, unspecified: Secondary | ICD-10-CM | POA: Diagnosis not present

## 2019-11-05 DIAGNOSIS — I119 Hypertensive heart disease without heart failure: Secondary | ICD-10-CM | POA: Diagnosis not present

## 2019-11-05 DIAGNOSIS — K760 Fatty (change of) liver, not elsewhere classified: Secondary | ICD-10-CM | POA: Diagnosis not present

## 2019-11-05 DIAGNOSIS — I2510 Atherosclerotic heart disease of native coronary artery without angina pectoris: Secondary | ICD-10-CM | POA: Diagnosis not present

## 2019-11-05 DIAGNOSIS — E039 Hypothyroidism, unspecified: Secondary | ICD-10-CM | POA: Diagnosis not present

## 2019-11-05 DIAGNOSIS — E785 Hyperlipidemia, unspecified: Secondary | ICD-10-CM | POA: Diagnosis not present

## 2019-11-05 DIAGNOSIS — Z23 Encounter for immunization: Secondary | ICD-10-CM | POA: Diagnosis not present

## 2019-11-05 DIAGNOSIS — Z950 Presence of cardiac pacemaker: Secondary | ICD-10-CM | POA: Diagnosis not present

## 2019-11-05 DIAGNOSIS — R2689 Other abnormalities of gait and mobility: Secondary | ICD-10-CM | POA: Diagnosis not present

## 2019-11-05 DIAGNOSIS — D692 Other nonthrombocytopenic purpura: Secondary | ICD-10-CM | POA: Diagnosis not present

## 2019-11-05 DIAGNOSIS — M8580 Other specified disorders of bone density and structure, unspecified site: Secondary | ICD-10-CM | POA: Diagnosis not present

## 2019-11-05 DIAGNOSIS — E119 Type 2 diabetes mellitus without complications: Secondary | ICD-10-CM | POA: Diagnosis not present

## 2019-11-12 DIAGNOSIS — H52223 Regular astigmatism, bilateral: Secondary | ICD-10-CM | POA: Diagnosis not present

## 2019-11-12 DIAGNOSIS — H5201 Hypermetropia, right eye: Secondary | ICD-10-CM | POA: Diagnosis not present

## 2019-11-12 DIAGNOSIS — E119 Type 2 diabetes mellitus without complications: Secondary | ICD-10-CM | POA: Diagnosis not present

## 2019-11-12 DIAGNOSIS — Z9849 Cataract extraction status, unspecified eye: Secondary | ICD-10-CM | POA: Diagnosis not present

## 2019-11-12 DIAGNOSIS — Z961 Presence of intraocular lens: Secondary | ICD-10-CM | POA: Diagnosis not present

## 2019-11-12 DIAGNOSIS — H35033 Hypertensive retinopathy, bilateral: Secondary | ICD-10-CM | POA: Diagnosis not present

## 2019-11-12 DIAGNOSIS — H02834 Dermatochalasis of left upper eyelid: Secondary | ICD-10-CM | POA: Diagnosis not present

## 2019-11-12 DIAGNOSIS — H524 Presbyopia: Secondary | ICD-10-CM | POA: Diagnosis not present

## 2019-11-12 DIAGNOSIS — I10 Essential (primary) hypertension: Secondary | ICD-10-CM | POA: Diagnosis not present

## 2019-11-12 DIAGNOSIS — H5212 Myopia, left eye: Secondary | ICD-10-CM | POA: Diagnosis not present

## 2019-11-15 DIAGNOSIS — H35352 Cystoid macular degeneration, left eye: Secondary | ICD-10-CM | POA: Diagnosis not present

## 2019-11-15 DIAGNOSIS — H353132 Nonexudative age-related macular degeneration, bilateral, intermediate dry stage: Secondary | ICD-10-CM | POA: Diagnosis not present

## 2019-11-15 DIAGNOSIS — H33102 Unspecified retinoschisis, left eye: Secondary | ICD-10-CM | POA: Diagnosis not present

## 2019-11-15 DIAGNOSIS — D3131 Benign neoplasm of right choroid: Secondary | ICD-10-CM | POA: Diagnosis not present

## 2019-11-17 ENCOUNTER — Ambulatory Visit (INDEPENDENT_AMBULATORY_CARE_PROVIDER_SITE_OTHER): Payer: Medicare Other

## 2019-11-17 DIAGNOSIS — I442 Atrioventricular block, complete: Secondary | ICD-10-CM | POA: Diagnosis not present

## 2019-11-17 LAB — CUP PACEART REMOTE DEVICE CHECK
Battery Remaining Longevity: 114 mo
Battery Remaining Percentage: 95.5 %
Battery Voltage: 2.98 V
Brady Statistic AP VP Percent: 48 %
Brady Statistic AP VS Percent: 1 %
Brady Statistic AS VP Percent: 52 %
Brady Statistic AS VS Percent: 1 %
Brady Statistic RA Percent Paced: 47 %
Brady Statistic RV Percent Paced: 99 %
Date Time Interrogation Session: 20211110020015
Implantable Lead Implant Date: 20170803
Implantable Lead Implant Date: 20170803
Implantable Lead Location: 753859
Implantable Lead Location: 753860
Implantable Pulse Generator Implant Date: 20170803
Lead Channel Impedance Value: 380 Ohm
Lead Channel Impedance Value: 410 Ohm
Lead Channel Pacing Threshold Amplitude: 0.5 V
Lead Channel Pacing Threshold Amplitude: 0.75 V
Lead Channel Pacing Threshold Pulse Width: 0.5 ms
Lead Channel Pacing Threshold Pulse Width: 0.5 ms
Lead Channel Sensing Intrinsic Amplitude: 2 mV
Lead Channel Sensing Intrinsic Amplitude: 2.1 mV
Lead Channel Setting Pacing Amplitude: 1 V
Lead Channel Setting Pacing Amplitude: 2 V
Lead Channel Setting Pacing Pulse Width: 0.5 ms
Lead Channel Setting Sensing Sensitivity: 2 mV
Pulse Gen Model: 2272
Pulse Gen Serial Number: 7929478

## 2019-11-18 NOTE — Progress Notes (Signed)
Remote pacemaker transmission.   

## 2020-02-16 ENCOUNTER — Ambulatory Visit (INDEPENDENT_AMBULATORY_CARE_PROVIDER_SITE_OTHER): Payer: Medicare Other

## 2020-02-16 DIAGNOSIS — I442 Atrioventricular block, complete: Secondary | ICD-10-CM | POA: Diagnosis not present

## 2020-02-17 LAB — CUP PACEART REMOTE DEVICE CHECK
Battery Remaining Longevity: 114 mo
Battery Remaining Percentage: 95.5 %
Battery Voltage: 2.98 V
Brady Statistic AP VP Percent: 46 %
Brady Statistic AP VS Percent: 1 %
Brady Statistic AS VP Percent: 54 %
Brady Statistic AS VS Percent: 1 %
Brady Statistic RA Percent Paced: 46 %
Brady Statistic RV Percent Paced: 99 %
Date Time Interrogation Session: 20220209033021
Implantable Lead Implant Date: 20170803
Implantable Lead Implant Date: 20170803
Implantable Lead Location: 753859
Implantable Lead Location: 753860
Implantable Pulse Generator Implant Date: 20170803
Lead Channel Impedance Value: 360 Ohm
Lead Channel Impedance Value: 380 Ohm
Lead Channel Pacing Threshold Amplitude: 0.5 V
Lead Channel Pacing Threshold Amplitude: 0.75 V
Lead Channel Pacing Threshold Pulse Width: 0.5 ms
Lead Channel Pacing Threshold Pulse Width: 0.5 ms
Lead Channel Sensing Intrinsic Amplitude: 2.4 mV
Lead Channel Sensing Intrinsic Amplitude: 2.4 mV
Lead Channel Setting Pacing Amplitude: 1 V
Lead Channel Setting Pacing Amplitude: 2 V
Lead Channel Setting Pacing Pulse Width: 0.5 ms
Lead Channel Setting Sensing Sensitivity: 2 mV
Pulse Gen Model: 2272
Pulse Gen Serial Number: 7929478

## 2020-02-21 NOTE — Progress Notes (Signed)
Remote pacemaker transmission.   

## 2020-03-31 DIAGNOSIS — E119 Type 2 diabetes mellitus without complications: Secondary | ICD-10-CM | POA: Diagnosis not present

## 2020-03-31 DIAGNOSIS — E039 Hypothyroidism, unspecified: Secondary | ICD-10-CM | POA: Diagnosis not present

## 2020-03-31 DIAGNOSIS — E785 Hyperlipidemia, unspecified: Secondary | ICD-10-CM | POA: Diagnosis not present

## 2020-03-31 DIAGNOSIS — E559 Vitamin D deficiency, unspecified: Secondary | ICD-10-CM | POA: Diagnosis not present

## 2020-04-04 NOTE — Progress Notes (Signed)
Cardiology Office Note    Date:  04/11/2020  ID:  Diana Wood, Diana Wood [birth year], MRN [id number] PCP:  [name], MD  Cardiologist:  Dr. Johnsie Cancel    Chief Complaint: None f/u CAD and AV block   History of Present Illness:   87 y.o. history of CABG 11/2006, HTN, HLD, Hypothyroidism, DVT, GERD and history of uterine cancer Last myovue normal 02/22/16  Developed bradycardia and had a MRI compatible  PPM placed by Dr Curt Bears August 2017 Edison Nasuti for thyroid and cholesterol labs   Having some stress with her husband who seems to be more short tempered  No cardiac issues. [place] daughter Eduard Clos 42 years old now   Seen by Dr Curt Bears normal PPM [facility] by [facility] 02/16/20 reviewed  Having some balance issues and falling    Past Medical History:  Diagnosis Date  . Anemia   . CAD (coronary artery disease)    a. s/p CABG 2008.  [name] Chest pain   . Deep vein thrombosis ([place])   . Diabetes mellitus   . GERD (gastroesophageal reflux disease)   . Hiatal hernia   . Hypercholesterolemia   . Hyperlipidemia   . Hypertension   . Hypothyroid   . Obesity   . Pleural effusion   . Uterine cancer [facility])     Past Surgical History:  Procedure Laterality Date  . ABDOMINAL HYSTERECTOMY    . bilateral feet surgery    . bilateral knee surgeries    . CATARACT EXTRACTION, BILATERAL    . CORONARY ARTERY BYPASS GRAFT     x 4-11'08-Dr. Bartle([facility])  . endoscopic vein harvesting from both thighs     Gilford Raid MD  . EP IMPLANTABLE DEVICE N/A 08/10/2015   Procedure: Pacemaker Implant;  Surgeon: Will Meredith Leeds, MD;  Location: [place] [facility];  Service: Cardiovascular;  Laterality: N/A;  . excision cyst debridement fusion distal interphalangeal joint ring finger  with Mini Acutrak screw 20 mm in length    . EYE SURGERY     corneal transplant -right  . hysterectomy  2000   dx of uterine cancer  . JOINT REPLACEMENT     right knee replacement-Dr. supple  . surgery on 2 fingers     r HAND  .  TOTAL KNEE ARTHROPLASTY Left 03/16/2012   Procedure: TOTAL KNEE ARTHROPLASTY;  Surgeon: Gearlean Alf, MD;  [facility];  Service: Orthopedics;  Laterality: Left;    Current Medications: Current Outpatient Medications  Medication Sig Dispense Refill  . Accu-Chek Softclix Lancets lancets 1 each by Other route as needed.    [name] amLODipine (NORVASC) 10 MG tablet Take 10 mg by mouth daily. At 1500    . Cholecalciferol (VITAMIN D PO) Take 2,000 Units by mouth daily after breakfast.     . fenofibrate (TRICOR) 145 MG tablet Take 145 mg by mouth daily after breakfast.    . hydrALAZINE (APRESOLINE) 25 MG tablet Take 25 mg by mouth 3 (three) times daily. After breakfast, at 1500 and at bedtime    . hydrochlorothiazide (HYDRODIURIL) 12.5 MG tablet Take 12.5 mg by mouth daily after breakfast.     . levothyroxine (SYNTHROID, LEVOTHROID) 112 MCG tablet Take 112 mcg by mouth daily before breakfast.    . metFORMIN (GLUCOPHAGE) 500 MG tablet Take 1 tablet by mouth daily.     . nitroGLYCERIN (NITROSTAT) 0.4 MG SL tablet Place 1 tablet (0.4 mg total) under the tongue every 5 (five) minutes as needed for chest pain. 25  tablet 3  . Omega-3 Fatty Acids (FISH OIL PO) Take 1 tablet by mouth daily.    [name] omeprazole (PRILOSEC) 20 MG capsule Take 20 mg by mouth daily after breakfast.    . prednisoLONE acetate (PRED FORTE) 1 % ophthalmic suspension Place 1 drop into both eyes every morning.    . sertraline (ZOLOFT) 50 MG tablet Take 50 mg by mouth daily.    . traMADol (ULTRAM) 50 MG tablet Take 1 tablet by mouth 3 (three) times daily as needed. Take 1 tablet 3 times a day by oral route as needed for pain for 5 days.    . TRUE METRIX BLOOD GLUCOSE TEST test strip 1 each by Other route daily.     No current facility-administered medications for this visit.     Allergies:   Losartan and Tape   Social History   Socioeconomic History  . Marital status: Married    Spouse name: Not on file  . Number of children: 3   . Years of education: Not on file  . Highest education level: Not on file  Occupational History  . 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 Smoking status: Never Smoker  . Smokeless tobacco: Never Used  Vaping Use  . Vaping Use: Never used  Substance and Sexual Activity  . Alcohol use: No  . Drug use: No  . Sexual activity: Yes  Other Topics Concern  . Not on file  Social History Narrative  . Not on file   Social Determinants of Health   Financial Resource Strain: Not on file  Food Insecurity: Not on file  Transportation Needs: Not on file  Physical Activity: Not on file  Stress: Not on file  Social Connections: Not on file     Family History:  The patient's family history includes Blindness in her brother; Cancer in her mother; Diabetes in her mother.   ROS:   Please see the history of present illness.  All other systems are reviewed and otherwise negative.    PHYSICAL EXAM:   VS:  BP 126/70   Pulse 73   Ht 5\' 1"  (1.549 m)   Wt 63 kg   SpO2 99%   BMI 26.26 kg/m   BMI: Body mass index is 26.26 kg/m. Affect appropriate Healthy:  appears stated age 89: normal Neck supple with no adenopathy JVP normal no bruits no thyromegaly Lungs clear with no wheezing and good diaphragmatic motion Heart:  S1/S2 no murmur, no rub, gallop or click PMI normal pacer under right clavicle  Abdomen: benighn, BS positve, no tenderness, no AAA no bruit.  No HSM or HJR Distal pulses intact with no bruits No edema Neuro non-focal Skin warm and dry No muscular weakness   Wt Readings from Last 3 Encounters:  04/11/20 63 kg  10/13/19 63 kg  08/24/19 64 kg      Studies/Labs Reviewed:   EKG:   08/04/15 CHB narrow complex escape 02/14/16  P synch pacing LBBB  03/05/17 AV paced with LBBB morphology   Recent Labs: No results found for requested labs within last 8760 hours.   Lipid Panel   Additional studies/ records that were  reviewed today include: Summarized above.    ASSESSMENT & PLAN:   1. PPM:  V pacing 100% of time  f/u EP Camnitz St Jude device MRI compatible  2. CAD s/p CABG - non ischemic myovue 02/22/16 EF 66%  New nitro called in  3. Chol:  Labs followed by Virgina Jock on fibrate not sure why not on statin 4. Thyroid:  Synthroid dose reduced f/u labs with Virgina Jock Lab Results  Component Value Date   TSH 0.155 (L) 08/27/2016  5. DM:  Discussed low carb diet.  Target hemoglobin A1c is 6.5 or less.  Continue current medications. 6. Anxiety/Depression: on Zoloft mood seems appropriate    Jenkins Rouge

## 2020-04-07 DIAGNOSIS — M8580 Other specified disorders of bone density and structure, unspecified site: Secondary | ICD-10-CM | POA: Diagnosis not present

## 2020-04-07 DIAGNOSIS — F325 Major depressive disorder, single episode, in full remission: Secondary | ICD-10-CM | POA: Diagnosis not present

## 2020-04-07 DIAGNOSIS — M1990 Unspecified osteoarthritis, unspecified site: Secondary | ICD-10-CM | POA: Diagnosis not present

## 2020-04-07 DIAGNOSIS — E119 Type 2 diabetes mellitus without complications: Secondary | ICD-10-CM | POA: Diagnosis not present

## 2020-04-07 DIAGNOSIS — Z1331 Encounter for screening for depression: Secondary | ICD-10-CM | POA: Diagnosis not present

## 2020-04-07 DIAGNOSIS — D692 Other nonthrombocytopenic purpura: Secondary | ICD-10-CM | POA: Diagnosis not present

## 2020-04-07 DIAGNOSIS — I2510 Atherosclerotic heart disease of native coronary artery without angina pectoris: Secondary | ICD-10-CM | POA: Diagnosis not present

## 2020-04-07 DIAGNOSIS — I119 Hypertensive heart disease without heart failure: Secondary | ICD-10-CM | POA: Diagnosis not present

## 2020-04-07 DIAGNOSIS — I700 Atherosclerosis of aorta: Secondary | ICD-10-CM | POA: Diagnosis not present

## 2020-04-07 DIAGNOSIS — R2689 Other abnormalities of gait and mobility: Secondary | ICD-10-CM | POA: Diagnosis not present

## 2020-04-07 DIAGNOSIS — E785 Hyperlipidemia, unspecified: Secondary | ICD-10-CM | POA: Diagnosis not present

## 2020-04-07 DIAGNOSIS — D509 Iron deficiency anemia, unspecified: Secondary | ICD-10-CM | POA: Diagnosis not present

## 2020-04-07 DIAGNOSIS — Z0000 Encounter for general adult medical examination without abnormal findings: Secondary | ICD-10-CM | POA: Diagnosis not present

## 2020-04-07 DIAGNOSIS — Z1389 Encounter for screening for other disorder: Secondary | ICD-10-CM | POA: Diagnosis not present

## 2020-04-11 ENCOUNTER — Other Ambulatory Visit: Payer: Self-pay

## 2020-04-11 ENCOUNTER — Ambulatory Visit (INDEPENDENT_AMBULATORY_CARE_PROVIDER_SITE_OTHER): Payer: Medicare Other | Admitting: Cardiovascular Disease

## 2020-04-11 ENCOUNTER — Encounter: Payer: Self-pay | Admitting: Cardiovascular Disease

## 2020-04-11 VITALS — BP 126/70 | HR 73 | Ht 61.0 in | Wt 139.0 lb

## 2020-04-11 DIAGNOSIS — Z951 Presence of aortocoronary bypass graft: Secondary | ICD-10-CM | POA: Diagnosis not present

## 2020-04-11 NOTE — Patient Instructions (Signed)
Medication Instructions:  *If you need a refill on your cardiac medications before your next appointment, please call your pharmacy*  Lab Work: If you have labs (blood work) drawn today and your tests are completely normal, you will receive your results only by: [name] MyChart Message (if you have MyChart) OR . A paper copy in the mail If you have any lab test that is abnormal or we need to change your treatment, we will call you to review the results.  Testing/Procedures: None ordered today.   Follow-Up: At [facility], you and your health needs are our priority.  As part of our continuing mission to provide you with exceptional heart care, we have created designated Provider Care Teams.  These Care Teams include your primary Cardiologist (physician) and Advanced Practice Providers (APPs -  Physician Assistants and Nurse Practitioners) who all work together to provide you with the care you need, when you need it.  We recommend signing up for the patient portal called "MyChart".  Sign up information is provided on this After Visit Summary.  MyChart is used to connect with patients for Virtual Visits (Telemedicine).  Patients are able to view lab/test results, encounter notes, upcoming appointments, etc.  Non-urgent messages can be sent to your provider as well.   To learn more about what you can do with MyChart, go to [email or web address].    Your next appointment:   6 month   The format for your next appointment:   In Person  Provider:   You may see Dr. Johnsie Cancel or one of the following Advanced Practice Providers on your designated Care Team:    Kathyrn Drown, NP

## 2020-04-18 DIAGNOSIS — Z85828 Personal history of other malignant neoplasm of skin: Secondary | ICD-10-CM | POA: Diagnosis not present

## 2020-04-18 DIAGNOSIS — L570 Actinic keratosis: Secondary | ICD-10-CM | POA: Diagnosis not present

## 2020-04-18 DIAGNOSIS — L905 Scar conditions and fibrosis of skin: Secondary | ICD-10-CM | POA: Diagnosis not present

## 2020-04-18 DIAGNOSIS — D225 Melanocytic nevi of trunk: Secondary | ICD-10-CM | POA: Diagnosis not present

## 2020-04-18 DIAGNOSIS — D0472 Carcinoma in situ of skin of left lower limb, including hip: Secondary | ICD-10-CM | POA: Diagnosis not present

## 2020-04-18 DIAGNOSIS — L304 Erythema intertrigo: Secondary | ICD-10-CM | POA: Diagnosis not present

## 2020-04-18 DIAGNOSIS — L918 Other hypertrophic disorders of the skin: Secondary | ICD-10-CM | POA: Diagnosis not present

## 2020-04-18 DIAGNOSIS — L821 Other seborrheic keratosis: Secondary | ICD-10-CM | POA: Diagnosis not present

## 2020-04-18 DIAGNOSIS — L814 Other melanin hyperpigmentation: Secondary | ICD-10-CM | POA: Diagnosis not present

## 2020-04-18 DIAGNOSIS — L820 Inflamed seborrheic keratosis: Secondary | ICD-10-CM | POA: Diagnosis not present

## 2020-04-18 DIAGNOSIS — D485 Neoplasm of uncertain behavior of skin: Secondary | ICD-10-CM | POA: Diagnosis not present

## 2020-05-10 ENCOUNTER — Other Ambulatory Visit: Payer: Self-pay

## 2020-05-10 ENCOUNTER — Emergency Department: Payer: Medicare Other

## 2020-05-10 ENCOUNTER — Emergency Department
Admission: EM | Admit: 2020-05-10 | Discharge: 2020-05-10 | Disposition: A | Discharge status: Home / Self Care | Payer: Medicare Other | Attending: Emergency Medicine | Admitting: Emergency Medicine

## 2020-05-10 DIAGNOSIS — Y92002 Bathroom of unspecified non-institutional (private) residence single-family (private) house as the place of occurrence of the external cause: Secondary | ICD-10-CM | POA: Insufficient documentation

## 2020-05-10 DIAGNOSIS — U071 COVID-19: Secondary | ICD-10-CM

## 2020-05-10 DIAGNOSIS — E039 Hypothyroidism, unspecified: Secondary | ICD-10-CM | POA: Diagnosis not present

## 2020-05-10 DIAGNOSIS — R410 Disorientation, unspecified: Secondary | ICD-10-CM | POA: Diagnosis not present

## 2020-05-10 DIAGNOSIS — E119 Type 2 diabetes mellitus without complications: Secondary | ICD-10-CM | POA: Insufficient documentation

## 2020-05-10 DIAGNOSIS — Z96653 Presence of artificial knee joint, bilateral: Secondary | ICD-10-CM | POA: Insufficient documentation

## 2020-05-10 DIAGNOSIS — R4182 Altered mental status, unspecified: Secondary | ICD-10-CM | POA: Diagnosis not present

## 2020-05-10 DIAGNOSIS — Z043 Encounter for examination and observation following other accident: Secondary | ICD-10-CM | POA: Diagnosis not present

## 2020-05-10 DIAGNOSIS — Z951 Presence of aortocoronary bypass graft: Secondary | ICD-10-CM | POA: Insufficient documentation

## 2020-05-10 DIAGNOSIS — I6782 Cerebral ischemia: Secondary | ICD-10-CM | POA: Diagnosis not present

## 2020-05-10 DIAGNOSIS — Z8542 Personal history of malignant neoplasm of other parts of uterus: Secondary | ICD-10-CM | POA: Insufficient documentation

## 2020-05-10 DIAGNOSIS — W182XXA Fall in (into) shower or empty bathtub, initial encounter: Secondary | ICD-10-CM | POA: Diagnosis not present

## 2020-05-10 DIAGNOSIS — Z79899 Other long term (current) drug therapy: Secondary | ICD-10-CM | POA: Insufficient documentation

## 2020-05-10 DIAGNOSIS — I10 Essential (primary) hypertension: Secondary | ICD-10-CM | POA: Diagnosis not present

## 2020-05-10 DIAGNOSIS — I119 Hypertensive heart disease without heart failure: Secondary | ICD-10-CM | POA: Diagnosis not present

## 2020-05-10 DIAGNOSIS — Z7984 Long term (current) use of oral hypoglycemic drugs: Secondary | ICD-10-CM | POA: Diagnosis not present

## 2020-05-10 DIAGNOSIS — W19XXXA Unspecified fall, initial encounter: Secondary | ICD-10-CM

## 2020-05-10 DIAGNOSIS — I2510 Atherosclerotic heart disease of native coronary artery without angina pectoris: Secondary | ICD-10-CM | POA: Insufficient documentation

## 2020-05-10 LAB — T4, FREE: Free T4: 1.66 ng/dL — ABNORMAL HIGH (ref 0.61–1.12)

## 2020-05-10 LAB — URINALYSIS, COMPLETE (UACMP) WITH MICROSCOPIC
Bacteria, UA: NONE SEEN
Bilirubin Urine: NEGATIVE
Glucose, UA: NEGATIVE mg/dL
Hgb urine dipstick: NEGATIVE
Ketones, ur: NEGATIVE mg/dL
Nitrite: NEGATIVE
Protein, ur: NEGATIVE mg/dL
Specific Gravity, Urine: 1.01 (ref 1.005–1.030)
pH: 5 (ref 5.0–8.0)

## 2020-05-10 LAB — COMPREHENSIVE METABOLIC PANEL
ALT: 22 U/L (ref 0–44)
AST: 61 U/L — ABNORMAL HIGH (ref 15–41)
Albumin: 3.9 g/dL (ref 3.5–5.0)
Alkaline Phosphatase: 35 U/L — ABNORMAL LOW (ref 38–126)
Anion gap: 11 (ref 5–15)
BUN: 27 mg/dL — ABNORMAL HIGH (ref 8–23)
CO2: 25 mmol/L (ref 22–32)
Calcium: 9.4 mg/dL (ref 8.9–10.3)
Chloride: 100 mmol/L (ref 98–111)
Creatinine, Ser: 1 mg/dL (ref 0.44–1.00)
GFR, Estimated: 55 mL/min — ABNORMAL LOW (ref >60)
Glucose, Bld: 116 mg/dL — ABNORMAL HIGH (ref 70–99)
Potassium: 3.6 mmol/L (ref 3.5–5.1)
Sodium: 136 mmol/L (ref 135–145)
Total Bilirubin: 0.6 mg/dL (ref 0.3–1.2)
Total Protein: 7.4 g/dL (ref 6.5–8.1)

## 2020-05-10 LAB — DIFFERENTIAL
Abs Immature Granulocytes: 0.04 10*3/uL (ref 0.00–0.07)
Basophils Absolute: 0 10*3/uL (ref 0.0–0.1)
Basophils Relative: 0 %
Eosinophils Absolute: 0 10*3/uL (ref 0.0–0.5)
Eosinophils Relative: 0 %
Immature Granulocytes: 0 %
Lymphocytes Relative: 36 %
Lymphs Abs: 3.7 10*3/uL (ref 0.7–4.0)
Monocytes Absolute: 1.1 10*3/uL — ABNORMAL HIGH (ref 0.1–1.0)
Monocytes Relative: 10 %
Neutro Abs: 5.5 10*3/uL (ref 1.7–7.7)
Neutrophils Relative %: 54 %

## 2020-05-10 LAB — APTT: aPTT: 31 seconds (ref 24–36)

## 2020-05-10 LAB — CBG MONITORING, ED: Glucose-Capillary: 113 mg/dL — ABNORMAL HIGH (ref 70–99)

## 2020-05-10 LAB — CBC
HCT: 27.4 % — ABNORMAL LOW (ref 36.0–46.0)
Hemoglobin: 8.7 g/dL — ABNORMAL LOW (ref 12.0–15.0)
MCH: 25.9 pg — ABNORMAL LOW (ref 26.0–34.0)
MCHC: 31.8 g/dL (ref 30.0–36.0)
MCV: 81.5 fL (ref 80.0–100.0)
Platelets: 357 10*3/uL (ref 150–400)
RBC: 3.36 MIL/uL — ABNORMAL LOW (ref 3.87–5.11)
RDW: 14.6 % (ref 11.5–15.5)
WBC: 10.3 10*3/uL (ref 4.0–10.5)
nRBC: 0 % (ref 0.0–0.2)

## 2020-05-10 LAB — PROTIME-INR
INR: 1 (ref 0.8–1.2)
Prothrombin Time: 13.1 seconds (ref 11.4–15.2)

## 2020-05-10 LAB — RESP PANEL BY RT-PCR (FLU A&B, COVID) ARPGX2
Influenza A by PCR: NEGATIVE
Influenza B by PCR: NEGATIVE
SARS Coronavirus 2 by RT PCR: POSITIVE — AB

## 2020-05-10 LAB — TROPONIN I (HIGH SENSITIVITY)
Troponin I (High Sensitivity): 15 ng/L (ref <18)
Troponin I (High Sensitivity): 15 ng/L (ref <18)

## 2020-05-10 LAB — TSH: TSH: 4.252 u[IU]/mL (ref 0.350–4.500)

## 2020-05-10 NOTE — ED Notes (Signed)
Pt calm , collective , discharge intructions reviewed.

## 2020-05-10 NOTE — ED Triage Notes (Signed)
Pt to ER via POV with daughter. Daughter reports that pt fell in the bathroom last night, may have been on the floor for approx 45 minutes before she was helped up. Pt does not remember events, unsure LOC or if hit head. Does not take blood thinners.  Pt denies any pain at this time. Daughter states that she had some speech changes yesterday, stated that she "moved the microwave because her makeup wasn't going on right", when asked what she was doing in the bathroom. Daughter reports recent thyroid medication changes. Pt also has had decreased appetite since yesterday.

## 2020-05-10 NOTE — ED Notes (Signed)
Critical Result  Covid Positive   MD. Paduchowki - advised

## 2020-05-10 NOTE — ED Provider Notes (Signed)
[facility] Emergency Department Provider Note  Time seen: 4:38 PM  I have reviewed the triage vital signs and the nursing notes.   HISTORY  Chief Complaint Fall  HPI Diana Wood is a 87 y.o. female with a past medical history of anemia, CAD, diabetes, gastric reflux, hypertension, hyperlipidemia, presents to the emergency department for a fall and confusion.  According to the daughter patient had a fall during the day yesterday.  When the daughter was asking her about it she states she fell because she was trying to move her microwave because her make-up was not going on right.  Daughter states this was extremely atypical as the patient is normally alert and oriented.  Patient does not recall falling.  Is not sure if she hit her head.  Does not take any blood thinners.  Here the patient is awake and alert, she has no complaints no pain no headache.  Largely negative review of systems.  Past Medical History:  Diagnosis Date  . Anemia   . CAD (coronary artery disease)    a. s/p CABG 2008.  [name] Chest pain   . Deep vein thrombosis ([place])   . Diabetes mellitus   . GERD (gastroesophageal reflux disease)   . Hiatal hernia   . Hypercholesterolemia   . Hyperlipidemia   . Hypertension   . Hypothyroid   . Obesity   . Pleural effusion   . Uterine cancer [facility])     Patient Active Problem List   Diagnosis Date Noted  . Complete heart block ([place]) 08/10/2015  . Complete AV block ([place])   . Heart block 08/04/2015  . Postop Hyponatremia 03/17/2012  . Postoperative anemia due to acute blood loss 03/17/2012  . OA (osteoarthritis) of knee 03/16/2012  . BRONCHITIS NOT SPECIFIED AS ACUTE OR CHRONIC 01/11/2009  . DEGENERATIVE JOINT DISEASE, BOTH KNEES, SEVERE 01/11/2009  . COUGH 01/11/2009  . Hypothyroidism 07/02/2008  . Type 2 diabetes mellitus with HbA1C goal below 7.5 07/02/2008  . HYPERCHOLESTEROLEMIA 07/02/2008  . HYPERLIPIDEMIA 07/02/2008  . OBESITY 07/02/2008  .  ANEMIA 07/02/2008  . Essential hypertension 07/02/2008  . Coronary atherosclerosis 07/02/2008  . PLEURAL EFFUSION 07/02/2008  . GERD 07/02/2008  . HIATAL HERNIA 07/02/2008  . CHEST PAIN 07/02/2008  . UTERINE CANCER, HX OF 07/02/2008    Past Surgical History:  Procedure Laterality Date  . ABDOMINAL HYSTERECTOMY    . bilateral feet surgery    . bilateral knee surgeries    . CATARACT EXTRACTION, BILATERAL    . CORONARY ARTERY BYPASS GRAFT     x 4-11'08-Dr. Bartle([facility])  . endoscopic vein harvesting from both thighs     Gilford Raid MD  . EP IMPLANTABLE DEVICE N/A 08/10/2015   Procedure: Pacemaker Implant;  Surgeon: Will Meredith Leeds, MD;  Location: [facility];  Service: Cardiovascular;  Laterality: N/A;  . excision cyst debridement fusion distal interphalangeal joint ring finger  with Mini Acutrak screw 20 mm in length    . EYE SURGERY     corneal transplant -right  . hysterectomy  2000   dx of uterine cancer  . JOINT REPLACEMENT     right knee replacement-Dr. supple  . surgery on 2 fingers     r HAND  . TOTAL KNEE ARTHROPLASTY Left 03/16/2012   Procedure: TOTAL KNEE ARTHROPLASTY;  Surgeon: Gearlean Alf, MD;  Location: [facility];  Service: Orthopedics;  Laterality: Left;    Prior to Admission medications   Medication Sig Start Date  End Date Taking? Authorizing Provider  Accu-Chek Softclix Lancets lancets 1 each by Other route as needed. 03/27/19   [provider]  amLODipine (NORVASC) 10 MG tablet Take 10 mg by mouth daily. At 1500    [provider]  Cholecalciferol (VITAMIN D PO) Take 2,000 Units by mouth daily after breakfast.     [provider]  fenofibrate (TRICOR) 145 MG tablet Take 145 mg by mouth daily after breakfast.    [provider]  hydrALAZINE (APRESOLINE) 25 MG tablet Take 25 mg by mouth 3 (three) times daily. After breakfast, at 1500 and at bedtime    [provider]  hydrochlorothiazide (HYDRODIURIL) 12.5 MG  tablet Take 12.5 mg by mouth daily after breakfast.     [provider]  levothyroxine (SYNTHROID, LEVOTHROID) 112 MCG tablet Take 112 mcg by mouth daily before breakfast.    [provider]  metFORMIN (GLUCOPHAGE) 500 MG tablet Take 1 tablet by mouth daily.  06/12/17   [provider]  nitroGLYCERIN (NITROSTAT) 0.4 MG SL tablet Place 1 tablet (0.4 mg total) under the tongue every 5 (five) minutes as needed for chest pain. 10/13/19   Josue Hector, MD  Omega-3 Fatty Acids (FISH OIL PO) Take 1 tablet by mouth daily.    [provider]  omeprazole (PRILOSEC) 20 MG capsule Take 20 mg by mouth daily after breakfast.    [provider]  prednisoLONE acetate (PRED FORTE) 1 % ophthalmic suspension Place 1 drop into both eyes every morning.    [provider]  sertraline (ZOLOFT) 50 MG tablet Take 50 mg by mouth daily.    [provider]  traMADol (ULTRAM) 50 MG tablet Take 1 tablet by mouth 3 (three) times daily as needed. Take 1 tablet 3 times a day by oral route as needed for pain for 5 days.    [provider]  TRUE METRIX BLOOD GLUCOSE TEST test strip 1 each by Other route daily. 03/27/19   [provider]    Allergies  Allergen Reactions  . Losartan Cough  . Tape Rash    Band aids ok, paper tape ok,    Family History  Problem Relation Age of Onset  . Cancer Mother   . Diabetes Mother   . Blindness Brother        partial from menigitis    Social History Social History   Tobacco Use  . Smoking status: Never Smoker  . Smokeless tobacco: Never Used  Vaping Use  . Vaping Use: Never used  Substance Use Topics  . Alcohol use: No  . Drug use: No    Review of Systems Constitutional: Negative for fever. Cardiovascular: Negative for chest pain. Respiratory: Negative for shortness of breath. Gastrointestinal: Negative for abdominal pain Musculoskeletal: Negative for musculoskeletal complaint Neurological:  Negative for headache All other ROS negative  ____________________________________________   PHYSICAL EXAM:  VITAL SIGNS: ED Triage Vitals  Enc Vitals Group     BP 05/10/20 1523 (!) 137/58     Pulse Rate 05/10/20 1523 61     Resp 05/10/20 1523 16     Temp 05/10/20 1523 99.1 F (37.3 C)     Temp Source 05/10/20 1523 Oral     SpO2 05/10/20 1523 97 %     Weight 05/10/20 1524 138 lb 14.2 oz (63 kg)     Height 05/10/20 1524 5\' 1"  (1.549 m)     Head Circumference --      Peak Flow --  Pain Score 05/10/20 1524 3     Pain Loc --      Pain Edu? --      Excl. in [place]? --    Constitutional: Alert and oriented. Well appearing and in no distress. Eyes: Normal exam ENT      Head: Normocephalic and atraumatic.      Mouth/Throat: Mucous membranes are moist. Cardiovascular: Normal rate, regular rhythm.  Respiratory: Normal respiratory effort without tachypnea nor retractions. Breath sounds are clear Gastrointestinal: Soft and nontender. No distention.   Musculoskeletal: Nontender with normal range of motion in all extremities.  Neurologic:  Normal speech and language. No gross focal neurologic deficits  Skin:  Skin is warm, dry and intact.  Psychiatric: Mood and affect are normal.   ____________________________________________    EKG  EKG viewed and interpreted by myself shows what appears to be a paced rhythm at 64 bpm with a widened QRS left axis deviation nonspecific ST changes.  ____________________________________________    RADIOLOGY  CT scan head is negative for acute abnormality.  ____________________________________________   INITIAL IMPRESSION / ASSESSMENT AND PLAN / ED COURSE  Pertinent labs & imaging results that were available during my care of the patient were reviewed by me and considered in my medical decision making (see chart for details).   Patient presents to the emergency department for for a fall yesterday and confusion that has since resolved.   Patient denies any confusion today.  He is answering questions appropriately following commands well.  No neurologic findings on examination.  Differential would include CVA/TIA/ICH, metabolic or electrolyte abnormality, infectious etiology.  We will check labs including urinalysis and a COVID swab.  We will obtain a CT scan of the head and continue to closely monitor.  CT scan of the head is negative for acute abnormality.  Lab work is overall reassuring.  COVID test has resulted positive.  Highly suspect COVID to be the cause of the patient's fall yesterday and confusion noted.  Patient is awake alert oriented x4.  I offered admission to the hospital.  Patient and daughter prefer discharge home.  She has help at home.  I discussed return precautions.  We will also refer to the outpatient COVID care center.  Diana Wood was evaluated in Emergency Department on 05/10/2020 for the symptoms described in the history of present illness. She was evaluated in the context of the global COVID-19 pandemic, which necessitated consideration that the patient might be at risk for infection with the SARS-CoV-2 virus that causes COVID-19. Institutional protocols and algorithms that pertain to the evaluation of patients at risk for COVID-19 are in a state of rapid change based on information released by regulatory bodies including the CDC and federal and state organizations. These policies and algorithms were followed during the patient's care in the ED.  ____________________________________________   FINAL CLINICAL IMPRESSION(S) / ED DIAGNOSES  Confusion Fall COVID-19   Harvest Dark, MD 05/10/20 [phone]

## 2020-05-10 NOTE — ED Notes (Signed)
Daughter at bedside.

## 2020-05-10 NOTE — ED Notes (Signed)
ED Provider at bedside. 

## 2020-05-10 NOTE — ED Notes (Signed)
Patient transported to CT 

## 2020-05-10 NOTE — ED Notes (Addendum)
Pt presents via triage for c/o fall last night. Daughter sts patient was found on the bathroom floor by husband around 1500 yesterday; pt was awake but unable to get up due to weakness. Family assisted patient out of the floor and pt was confused and did not remember falling.  Then patient was talking nonsense around 1600 yesterday. Sts pt does not remember what she was saying or the events of yesterday.  Daughter states the patient had recent increase in sythroid dose per family doctor.  Pt sts she has been having fatigue x 2 days. Denies fever, N/V, urinary symptoms, headache, or other c/o.  No obvious trauma.

## 2020-05-11 ENCOUNTER — Telehealth: Payer: Self-pay | Admitting: Cardiovascular Disease

## 2020-05-11 ENCOUNTER — Telehealth: Payer: Self-pay

## 2020-05-11 ENCOUNTER — Other Ambulatory Visit: Payer: Self-pay | Admitting: Physician Assistant

## 2020-05-11 DIAGNOSIS — U071 COVID-19: Secondary | ICD-10-CM

## 2020-05-11 MED ORDER — NIRMATRELVIR/RITONAVIR (PAXLOVID)TABLET: 2.0000 | ORAL_TABLET | Freq: Two times a day (BID) | ORAL | 0 refills | Status: AC

## 2020-05-11 NOTE — Telephone Encounter (Signed)
Called to discuss with patient about COVID-19 symptoms and the use of one of the available treatments for those with mild to moderate Covid symptoms and at a high risk of hospitalization.  Pt appears to qualify for outpatient treatment due to co-morbid conditions and/or a member of an at-risk group in accordance with the FDA Emergency Use Authorization.    Symptom onset: Lethargic,runny nose 05/08/20 Vaccinated: Yes Booster? Yes Immunocompromised? Yes Qualifiers: DM,HTN,Pacemaker,CABG NIH Criteria:   Daughter would like to speak with APP.   Marcello Moores

## 2020-05-11 NOTE — Telephone Encounter (Signed)
Pt c/o medication issue:  1. Name of Medication:  nirmatrelvir/ritonavir EUA (PAXLOVID) TABS  2. How are you currently taking this medication (dosage and times per day)?  Patient has not started   3. Are you having a reaction (difficulty breathing--STAT)? N/A   4. What is your medication issue?  Ailene Rud with [facility] is requesting clarification on the patient's medication instructions.  Phone #: [phone]

## 2020-05-11 NOTE — Progress Notes (Signed)
Outpatient Oral COVID Treatment Note  I connected with Diana Wood on 05/11/2020/10:43 AM by telephone and verified that I am speaking with the correct person using two identifiers.  I discussed the limitations, risks, security, and privacy concerns of performing an evaluation and management service by telephone and the availability of in person appointments. I also discussed with the patient that there may be a patient responsible charge related to this service. The patient expressed understanding and agreed to proceed.  Patient location: home Provider location: office  Diagnosis: COVID-19 infection  Purpose of visit: Discussion of potential use of Molnupiravir or Paxlovid, a new treatment for mild to moderate COVID-19 viral infection in non-hospitalized patients.   Subjective: Patient is a 87 y.o. female who has been diagnosed with COVID 19 viral infection.  Their symptoms began on 5/2 with lethargy and nasal congestion.    Past Medical History:  Diagnosis Date  . Anemia   . CAD (coronary artery disease)    a. s/p CABG 2008.  [name] Chest pain   . Deep vein thrombosis ([place])   . Diabetes mellitus   . GERD (gastroesophageal reflux disease)   . Hiatal hernia   . Hypercholesterolemia   . Hyperlipidemia   . Hypertension   . Hypothyroid   . Obesity   . Pleural effusion   . Uterine cancer ([facility])     Allergies  Allergen Reactions  . Losartan Cough  . Tape Rash    Band aids ok, paper tape ok,     Current Outpatient Medications:  .  Accu-Chek Softclix Lancets lancets, 1 each by Other route as needed., Disp: , Rfl:  .  amLODipine (NORVASC) 10 MG tablet, Take 10 mg by mouth daily. At 1500, Disp: , Rfl:  .  Cholecalciferol (VITAMIN D PO), Take 2,000 Units by mouth daily after breakfast. , Disp: , Rfl:  .  fenofibrate (TRICOR) 145 MG tablet, Take 145 mg by mouth daily after breakfast., Disp: , Rfl:  .  hydrALAZINE (APRESOLINE) 25 MG tablet, Take 25 mg by mouth 3 (three) times daily. After  breakfast, at 1500 and at bedtime, Disp: , Rfl:  .  hydrochlorothiazide (HYDRODIURIL) 12.5 MG tablet, Take 12.5 mg by mouth daily after breakfast. , Disp: , Rfl:  .  levothyroxine (SYNTHROID, LEVOTHROID) 112 MCG tablet, Take 112 mcg by mouth daily before breakfast., Disp: , Rfl:  .  metFORMIN (GLUCOPHAGE) 500 MG tablet, Take 1 tablet by mouth daily. , Disp: , Rfl:  .  nitroGLYCERIN (NITROSTAT) 0.4 MG SL tablet, Place 1 tablet (0.4 mg total) under the tongue every 5 (five) minutes as needed for chest pain., Disp: 25 tablet, Rfl: 3 .  Omega-3 Fatty Acids (FISH OIL PO), Take 1 tablet by mouth daily., Disp: , Rfl:  .  omeprazole (PRILOSEC) 20 MG capsule, Take 20 mg by mouth daily after breakfast., Disp: , Rfl:  .  prednisoLONE acetate (PRED FORTE) 1 % ophthalmic suspension, Place 1 drop into both eyes every morning., Disp: , Rfl:  .  sertraline (ZOLOFT) 50 MG tablet, Take 50 mg by mouth daily., Disp: , Rfl:  .  traMADol (ULTRAM) 50 MG tablet, Take 1 tablet by mouth 3 (three) times daily as needed. Take 1 tablet 3 times a day by oral route as needed for pain for 5 days., Disp: , Rfl:  .  TRUE METRIX BLOOD GLUCOSE TEST test strip, 1 each by Other route daily., Disp: , Rfl:    Laboratory Data:  Recent Results (from the past  2160 hour(s))  CUP PACEART REMOTE DEVICE CHECK     Status: None   Collection Time: 02/16/20  3:30 AM  Result Value Ref Range   Date Time Interrogation Session [id number]    Pulse Generator Manufacturer [employer]    Pulse Gen Model 2272 Assurity MRI    Pulse Gen Serial Number [id number]    Clinic Name [facility]    Implantable Pulse Generator Type Implantable Pulse Generator    Implantable Pulse Generator Implant Date [id number]    Implantable Lead Manufacturer [facility]    Implantable Lead Model LPA1200M Tendril MRI    Implantable Lead Serial Number [id number]    Implantable Lead Implant Date VB:[id number]    Implantable Lead Location [id number]    Implantable Lead Manufacturer [facility]     Implantable Lead Model LPA1200M Tendril MRI    Implantable Lead Serial Number [id number]    Implantable Lead Implant Date VB:[id number]    Implantable Lead Location [id number]    Lead Channel Setting Sensing Sensitivity 2.0 mV   Lead Channel Setting Sensing Adaptation Mode Fixed Pacing    Lead Channel Setting Pacing Amplitude 2.0 V   Lead Channel Setting Pacing Pulse Width 0.5 ms   Lead Channel Setting Pacing Amplitude 1.0 V   Lead Channel Status NULL    Lead Channel Impedance Value 360 ohm   Lead Channel Sensing Intrinsic Amplitude 2.4 mV   Lead Channel Pacing Threshold Amplitude 0.5 V   Lead Channel Pacing Threshold Pulse Width 0.5 ms   Lead Channel Status NULL    Lead Channel Impedance Value 380 ohm   Lead Channel Sensing Intrinsic Amplitude 2.4 mV   Lead Channel Pacing Threshold Amplitude 0.75 V   Lead Channel Pacing Threshold Pulse Width 0.5 ms   Battery Status MOS    Battery Remaining Longevity 114 mo   Battery Remaining Percentage 95.5 %   Battery Voltage 2.98 V   Brady Statistic RA Percent Paced 46.0 %   Brady Statistic RV Percent Paced 99.0 %   Brady Statistic AP VP Percent 46.0 %   Brady Statistic AS VP Percent 54.0 %   Brady Statistic AP VS Percent 1.0 %   Brady Statistic AS VS Percent 1.0 %  Protime-INR     Status: None   Collection Time: 05/10/20  3:29 PM  Result Value Ref Range   Prothrombin Time 13.1 11.4 - 15.2 seconds   INR 1.0 0.8 - 1.2    Comment: (NOTE) INR goal varies based on device and disease states. Performed at [facility], [address] [place], [place] [address]   APTT     Status: None   Collection Time: 05/10/20  3:29 PM  Result Value Ref Range   aPTT 31 24 - 36 seconds    Comment: Performed at [facility], [address] [place], [place] [address]  CBC     Status: Abnormal   Collection Time: 05/10/20  3:29 PM  Result Value Ref Range   WBC 10.3 4.0 - 10.5 K/uL   RBC 3.36 (L) 3.87 - 5.11 MIL/uL   Hemoglobin 8.7 (L) 12.0 -  15.0 g/dL   HCT 27.4 (L) 36.0 - 46.0 %   MCV 81.5 80.0 - 100.0 fL   MCH 25.9 (L) 26.0 - 34.0 pg   MCHC 31.8 30.0 - 36.0 g/dL   RDW 14.6 11.5 - 15.5 %   Platelets 357 150 - 400 K/uL   nRBC 0.0 0.0 - 0.2 %    Comment: Performed at [facility]  [facility], [address] [place], [place] [address]  Differential     Status: Abnormal   Collection Time: 05/10/20  3:29 PM  Result Value Ref Range   Neutrophils Relative % 54 %   Neutro Abs 5.5 1.7 - 7.7 K/uL   Lymphocytes Relative 36 %   Lymphs Abs 3.7 0.7 - 4.0 K/uL   Monocytes Relative 10 %   Monocytes Absolute 1.1 (H) 0.1 - 1.0 K/uL   Eosinophils Relative 0 %   Eosinophils Absolute 0.0 0.0 - 0.5 K/uL   Basophils Relative 0 %   Basophils Absolute 0.0 0.0 - 0.1 K/uL   Immature Granulocytes 0 %   Abs Immature Granulocytes 0.04 0.00 - 0.07 K/uL    Comment: Performed at [facility], [address] [place], [place] [address]  Comprehensive metabolic panel     Status: Abnormal   Collection Time: 05/10/20  3:29 PM  Result Value Ref Range   Sodium 136 135 - 145 mmol/L   Potassium 3.6 3.5 - 5.1 mmol/L   Chloride 100 98 - 111 mmol/L   CO2 25 22 - 32 mmol/L   Glucose, Bld 116 (H) 70 - 99 mg/dL    Comment: Glucose reference range applies only to samples taken after fasting for at least 8 hours.   BUN 27 (H) 8 - 23 mg/dL   Creatinine, Ser 1.00 0.44 - 1.00 mg/dL   Calcium 9.4 8.9 - 10.3 mg/dL   Total Protein 7.4 6.5 - 8.1 g/dL   Albumin 3.9 3.5 - 5.0 g/dL   AST 61 (H) 15 - 41 U/L   ALT 22 0 - 44 U/L   Alkaline Phosphatase 35 (L) 38 - 126 U/L   Total Bilirubin 0.6 0.3 - 1.2 mg/dL   GFR, Estimated 55 (L) >60 mL/min    Comment: (NOTE) Calculated using the CKD-EPI Creatinine Equation (2021)    Anion gap 11 5 - 15    Comment: Performed at [facility], [place], [place] [address]  Troponin I (High Sensitivity)     Status: None   Collection Time: 05/10/20  3:42 PM  Result Value Ref Range   Troponin I  (High Sensitivity) 15 <18 ng/L    Comment: (NOTE) Elevated high sensitivity troponin I (hsTnI) values and significant  changes across serial measurements may suggest ACS but many other  chronic and acute conditions are known to elevate hsTnI results.  Refer to the "Links" section for chest pain algorithms and additional  guidance. Performed at [facility], [address] [place], [place] [address]   Urinalysis, Complete w Microscopic Urine, Clean Catch     Status: Abnormal   Collection Time: 05/10/20  3:43 PM  Result Value Ref Range   Color, Urine YELLOW (A) YELLOW   APPearance CLEAR (A) CLEAR   Specific Gravity, Urine 1.010 1.005 - 1.030   pH 5.0 5.0 - 8.0   Glucose, UA NEGATIVE NEGATIVE mg/dL   Hgb urine dipstick NEGATIVE NEGATIVE   Bilirubin Urine NEGATIVE NEGATIVE   Ketones, ur NEGATIVE NEGATIVE mg/dL   Protein, ur NEGATIVE NEGATIVE mg/dL   Nitrite NEGATIVE NEGATIVE   Leukocytes,Ua MODERATE (A) NEGATIVE   RBC / HPF 0-5 0 - 5 RBC/hpf   WBC, UA 11-20 0 - 5 WBC/hpf   Bacteria, UA NONE SEEN NONE SEEN   Squamous Epithelial / LPF 0-5 0 - 5   Mucus PRESENT     Comment: Performed at [facility], [address],  [place], [place] [address]  CBG monitoring, ED     Status: Abnormal   Collection Time: 05/10/20  3:51 PM  Result Value Ref Range   Glucose-Capillary 113 (H) 70 - 99 mg/dL    Comment: Glucose reference range applies only to samples taken after fasting for at least 8 hours.  Resp Panel by RT-PCR (Flu A&B, Covid) Nasopharyngeal Swab     Status: Abnormal   Collection Time: 05/10/20  3:54 PM   Specimen: Nasopharyngeal Swab; Nasopharyngeal(NP) swabs in vial transport medium  Result Value Ref Range   SARS Coronavirus 2 by RT PCR POSITIVE (A) NEGATIVE    Comment: RESULT CALLED TO, READ BACK BY AND VERIFIED WITH: RUSSELL AVENDANO AT 6045 ON 05/10/20 BY SS (NOTE) SARS-CoV-2 target nucleic acids are DETECTED.  The SARS-CoV-2 RNA is generally detectable in  upper respiratory specimens during the acute phase of infection. Positive results are indicative of the presence of the identified virus, but do not rule out bacterial infection or co-infection with other pathogens not detected by the test. Clinical correlation with patient history and other diagnostic information is necessary to determine patient infection status. The expected result is Negative.  Fact Sheet for Patients: EntrepreneurPulse.com.au  Fact Sheet for Healthcare Providers: IncredibleEmployment.be  This test is not yet approved or cleared by the [place] FDA and  has been authorized for detection and/or diagnosis of SARS-CoV-2 by FDA under an Emergency Use Authorization (EUA).  This EUA will remain in effect (meaning this test  can be used) for the duration of  the COVID-19 declaration under Section 564(b)(1) of the Act, 21 U.S.C. section 360bbb-3(b)(1), unless the authorization is terminated or revoked sooner.     Influenza A by PCR NEGATIVE NEGATIVE   Influenza B by PCR NEGATIVE NEGATIVE    Comment: (NOTE) The Xpert Xpress SARS-CoV-2/FLU/RSV plus assay is intended as an aid in the diagnosis of influenza from Nasopharyngeal swab specimens and should not be used as a sole basis for treatment. Nasal washings and aspirates are unacceptable for Xpert Xpress SARS-CoV-2/FLU/RSV testing.  Fact Sheet for Patients: EntrepreneurPulse.com.au  Fact Sheet for Healthcare Providers: IncredibleEmployment.be  This test is not yet approved or cleared by the [place] FDA and has been authorized for detection and/or diagnosis of SARS-CoV-2 by FDA under an Emergency Use Authorization (EUA). This EUA will remain in effect (meaning this test can be used) for the duration of the COVID-19 declaration under Section 564(b)(1) of the Act, 21 U.S.C. section 360bbb-3(b)(1), unless the authorization is terminated  or revoked.  Performed at [facility], [address] [place], [place] [address]   TSH     Status: None   Collection Time: 05/10/20  3:54 PM  Result Value Ref Range   TSH 4.252 0.350 - 4.500 uIU/mL    Comment: Performed by a 3rd Generation assay with a functional sensitivity of <=0.01 uIU/mL. Performed at [facility], [address] [place], [place] [address]   T4, free     Status: Abnormal   Collection Time: 05/10/20  3:54 PM  Result Value Ref Range   Free T4 1.66 (H) 0.61 - 1.12 ng/dL    Comment: (NOTE) Biotin ingestion may interfere with free T4 tests. If the results are inconsistent with the TSH level, previous test results, or the clinical presentation, then consider biotin interference. If needed, order repeat testing after stopping biotin. Performed at [facility], [address], [place], [place] [address]   Troponin I (High Sensitivity)  Status: None   Collection Time: 05/10/20  6:07 PM  Result Value Ref Range   Troponin I (High Sensitivity) 15 <18 ng/L    Comment: (NOTE) Elevated high sensitivity troponin I (hsTnI) values and significant  changes across serial measurements may suggest ACS but many other  chronic and acute conditions are known to elevate hsTnI results.  Refer to the "Links" section for chest pain algorithms and additional  guidance. Performed at [facility], [address], [place], [place] [address]      Assessment: 87 y.o. female with mild/moderate COVID 19 viral infection diagnosed on 5/2 at high risk for progression to severe COVID 19.  Plan:  This patient is a 87 y.o. female that meets the following criteria for Emergency Use Authorization of: Paxlovid 1. Age >12 yr AND > 40 kg 2. SARS-COV-2 positive test 3. Symptom onset < 5 days 4. Mild-to-moderate COVID disease with high risk for severe progression to hospitalization or death  I have spoken and communicated the following to the patient  or parent/caregiver regarding: 1. Paxlovid is an unapproved drug that is authorized for use under an Emergency Use Authorization.  2. There are no adequate, approved, available products for the treatment of COVID-19 in adults who have mild-to-moderate COVID-19 and are at high risk for progressing to severe COVID-19, including hospitalization or death. 3. Other therapeutics are currently authorized. For additional information on all products authorized for treatment or prevention of COVID-19, please see [employer].pl.  4. There are benefits and risks of taking this treatment as outlined in the "Fact Sheet for Patients and Caregivers."  5. "Fact Sheet for Patients and Caregivers" was reviewed with patient. A hard copy will be provided to patient from pharmacy prior to the patient receiving treatment. 6. Patients should continue to self-isolate and use infection control measures (e.g., wear mask, isolate, social distance, avoid sharing personal items, clean and disinfect "high touch" surfaces, and frequent handwashing) according to CDC guidelines.  7. The patient or parent/caregiver has the option to accept or refuse treatment. 8. Patient medication history was reviewed for potential drug interactions:Interaction with home meds: amlodipine: take half dose  9. Patient's GFR was calculated to be 55, and they were therefore prescribed Reduced dose (GFR 30-60) - nirmatrelvir 150mg  tab (1 tablet) by mouth twice daily AND ritonavir 100mg  tab (1 tablet) by mouth twice daily   After reviewing above information with the patient, the patient agrees to receive Paxlovid.  Follow up instructions:    . Take prescription BID x 5 days as directed . Reach out to pharmacist for counseling on medication if desired . For concerns regarding further COVID symptoms please follow up with your PCP or urgent  care . For urgent or life-threatening issues, seek care at your local emergency department  The patient was provided an opportunity to ask questions, and all were answered. The patient agreed with the plan and demonstrated an understanding of the instructions.   Script sent to [facility] and opted to pick up RX.  The patient was advised to call their PCP or seek an in-person evaluation if the symptoms worsen or if the condition fails to improve as anticipated.   I provided 15 minutes of non face-to-face telephone visit time during this encounter, and > 50% was spent counseling as documented under my assessment & plan.  Angelena Form, PA-C 05/11/2020 /10:43 AM

## 2020-05-12 NOTE — Telephone Encounter (Signed)
This was taken care of. Thank you!

## 2020-05-17 ENCOUNTER — Ambulatory Visit (INDEPENDENT_AMBULATORY_CARE_PROVIDER_SITE_OTHER): Payer: Medicare Other

## 2020-05-17 DIAGNOSIS — I442 Atrioventricular block, complete: Secondary | ICD-10-CM

## 2020-05-18 LAB — CUP PACEART REMOTE DEVICE CHECK
Battery Remaining Longevity: 114 mo
Battery Remaining Percentage: 95.5 %
Battery Voltage: 2.98 V
Brady Statistic AP VP Percent: 43 %
Brady Statistic AP VS Percent: 1 %
Brady Statistic AS VP Percent: 57 %
Brady Statistic AS VS Percent: 1 %
Brady Statistic RA Percent Paced: 42 %
Brady Statistic RV Percent Paced: 99 %
Date Time Interrogation Session: 20220511020013
Implantable Lead Implant Date: 20170803
Implantable Lead Implant Date: 20170803
Implantable Lead Location: 753859
Implantable Lead Location: 753860
Implantable Pulse Generator Implant Date: 20170803
Lead Channel Impedance Value: 360 Ohm
Lead Channel Impedance Value: 380 Ohm
Lead Channel Pacing Threshold Amplitude: 0.5 V
Lead Channel Pacing Threshold Amplitude: 0.75 V
Lead Channel Pacing Threshold Pulse Width: 0.5 ms
Lead Channel Pacing Threshold Pulse Width: 0.5 ms
Lead Channel Sensing Intrinsic Amplitude: 0.8 mV
Lead Channel Sensing Intrinsic Amplitude: 8.2 mV
Lead Channel Setting Pacing Amplitude: 1 V
Lead Channel Setting Pacing Amplitude: 2 V
Lead Channel Setting Pacing Pulse Width: 0.5 ms
Lead Channel Setting Sensing Sensitivity: 2 mV
Pulse Gen Model: 2272
Pulse Gen Serial Number: 7929478

## 2020-05-25 DIAGNOSIS — M8589 Other specified disorders of bone density and structure, multiple sites: Secondary | ICD-10-CM | POA: Diagnosis not present

## 2020-06-08 NOTE — Progress Notes (Signed)
Remote pacemaker transmission.   

## 2020-06-13 ENCOUNTER — Emergency Department: Payer: Medicare Other

## 2020-06-13 ENCOUNTER — Other Ambulatory Visit: Payer: Self-pay

## 2020-06-13 ENCOUNTER — Emergency Department
Admission: EM | Admit: 2020-06-13 | Discharge: 2020-06-13 | Disposition: A | Discharge status: Home / Self Care | Payer: Medicare Other | Attending: Emergency Medicine | Admitting: Emergency Medicine

## 2020-06-13 ENCOUNTER — Encounter: Payer: Self-pay | Admitting: Emergency Medicine

## 2020-06-13 DIAGNOSIS — S0181XA Laceration without foreign body of other part of head, initial encounter: Secondary | ICD-10-CM | POA: Insufficient documentation

## 2020-06-13 DIAGNOSIS — S199XXA Unspecified injury of neck, initial encounter: Secondary | ICD-10-CM | POA: Diagnosis not present

## 2020-06-13 DIAGNOSIS — I2510 Atherosclerotic heart disease of native coronary artery without angina pectoris: Secondary | ICD-10-CM | POA: Diagnosis not present

## 2020-06-13 DIAGNOSIS — R911 Solitary pulmonary nodule: Secondary | ICD-10-CM | POA: Diagnosis not present

## 2020-06-13 DIAGNOSIS — Z79899 Other long term (current) drug therapy: Secondary | ICD-10-CM | POA: Diagnosis not present

## 2020-06-13 DIAGNOSIS — Z96653 Presence of artificial knee joint, bilateral: Secondary | ICD-10-CM | POA: Insufficient documentation

## 2020-06-13 DIAGNOSIS — E119 Type 2 diabetes mellitus without complications: Secondary | ICD-10-CM | POA: Insufficient documentation

## 2020-06-13 DIAGNOSIS — E039 Hypothyroidism, unspecified: Secondary | ICD-10-CM | POA: Diagnosis not present

## 2020-06-13 DIAGNOSIS — I6523 Occlusion and stenosis of bilateral carotid arteries: Secondary | ICD-10-CM | POA: Diagnosis not present

## 2020-06-13 DIAGNOSIS — I672 Cerebral atherosclerosis: Secondary | ICD-10-CM | POA: Diagnosis not present

## 2020-06-13 DIAGNOSIS — Z7984 Long term (current) use of oral hypoglycemic drugs: Secondary | ICD-10-CM | POA: Diagnosis not present

## 2020-06-13 DIAGNOSIS — Z951 Presence of aortocoronary bypass graft: Secondary | ICD-10-CM | POA: Insufficient documentation

## 2020-06-13 DIAGNOSIS — I119 Hypertensive heart disease without heart failure: Secondary | ICD-10-CM | POA: Insufficient documentation

## 2020-06-13 DIAGNOSIS — S0083XA Contusion of other part of head, initial encounter: Secondary | ICD-10-CM | POA: Diagnosis not present

## 2020-06-13 DIAGNOSIS — S0990XA Unspecified injury of head, initial encounter: Secondary | ICD-10-CM | POA: Diagnosis not present

## 2020-06-13 DIAGNOSIS — W06XXXA Fall from bed, initial encounter: Secondary | ICD-10-CM | POA: Insufficient documentation

## 2020-06-13 MED ORDER — ACETAMINOPHEN 325 MG PO TABS
650.0000 mg | ORAL_TABLET | Freq: Once | ORAL | Status: AC
  Administered 2020-06-13: 650 mg via ORAL
  Filled 2020-06-13: qty 2

## 2020-06-13 MED ORDER — LIDOCAINE HCL (PF) 1 % IJ SOLN
5.0000 mL | Freq: Once | INTRAMUSCULAR | Status: AC
  Administered 2020-06-13: 5 mL
  Filled 2020-06-13: qty 5

## 2020-06-13 MED ORDER — LIDOCAINE-EPINEPHRINE-TETRACAINE (LET) TOPICAL GEL
3.0000 mL | Freq: Once | TOPICAL | Status: AC
  Administered 2020-06-13: 3 mL via TOPICAL
  Filled 2020-06-13: qty 3

## 2020-06-13 NOTE — ED Notes (Addendum)
Pt is sitting outside in car with family

## 2020-06-13 NOTE — ED Triage Notes (Addendum)
Pt to triage via w/c with no distress noted; reports falling getting OOB PTA hitting head on wooden foundation of bed; denies LOC/HA or dizziness; denies any pain at present, denies any other injuries; no cervical tenderness with palpation; saline soaked gauze dressing applied to laceration of left forehead--no active bleeding

## 2020-06-13 NOTE — ED Notes (Signed)
See triage note  Presents with laceration to left side of forehead   States she got up to use the bathroom  [employer] her head  No LOC

## 2020-06-13 NOTE — ED Provider Notes (Signed)
[facility] Emergency Department Provider Note   ____________________________________________   Event Date/Time   First MD Initiated Contact with Patient 06/13/20 [phone]     (approximate)  I have reviewed the triage vital signs and the nursing notes.   HISTORY  Chief Complaint Fall    HPI Diana Wood is a 87 y.o. female patient presents with left lateral forehead laceration secondary to falling out of bed.  There was no LOC.  Bleeding controlled with direct pressure.  Denies pain, vertigo, vision disturbance, or weakness.         Past Medical History:  Diagnosis Date  . Anemia   . CAD (coronary artery disease)    a. s/p CABG 2008.  [name] Chest pain   . Deep vein thrombosis ([place])   . Diabetes mellitus   . GERD (gastroesophageal reflux disease)   . Hiatal hernia   . Hypercholesterolemia   . Hyperlipidemia   . Hypertension   . Hypothyroid   . Obesity   . Pleural effusion   . Uterine cancer [facility])     Patient Active Problem List   Diagnosis Date Noted  . Complete heart block ([place]) 08/10/2015  . Complete AV block ([place])   . Heart block 08/04/2015  . Postop Hyponatremia 03/17/2012  . Postoperative anemia due to acute blood loss 03/17/2012  . OA (osteoarthritis) of knee 03/16/2012  . BRONCHITIS NOT SPECIFIED AS ACUTE OR CHRONIC 01/11/2009  . DEGENERATIVE JOINT DISEASE, BOTH KNEES, SEVERE 01/11/2009  . COUGH 01/11/2009  . Hypothyroidism 07/02/2008  . Type 2 diabetes mellitus with HbA1C goal below 7.5 07/02/2008  . HYPERCHOLESTEROLEMIA 07/02/2008  . HYPERLIPIDEMIA 07/02/2008  . OBESITY 07/02/2008  . ANEMIA 07/02/2008  . Essential hypertension 07/02/2008  . Coronary atherosclerosis 07/02/2008  . PLEURAL EFFUSION 07/02/2008  . GERD 07/02/2008  . HIATAL HERNIA 07/02/2008  . CHEST PAIN 07/02/2008  . UTERINE CANCER, HX OF 07/02/2008    Past Surgical History:  Procedure Laterality Date  . ABDOMINAL HYSTERECTOMY    . bilateral feet surgery     . bilateral knee surgeries    . CATARACT EXTRACTION, BILATERAL    . CORONARY ARTERY BYPASS GRAFT     x 4-11'08-Dr. Bartle([facility])  . endoscopic vein harvesting from both thighs     Gilford Raid MD  . EP IMPLANTABLE DEVICE N/A 08/10/2015   Procedure: Pacemaker Implant;  Surgeon: Will Meredith Leeds, MD;  Location: [place] CV LAB;  Service: Cardiovascular;  Laterality: N/A;  . excision cyst debridement fusion distal interphalangeal joint ring finger  with Mini Acutrak screw 20 mm in length    . EYE SURGERY     corneal transplant -right  . hysterectomy  2000   dx of uterine cancer  . JOINT REPLACEMENT     right knee replacement-Dr. supple  . surgery on 2 fingers     r HAND  . TOTAL KNEE ARTHROPLASTY Left 03/16/2012   Procedure: TOTAL KNEE ARTHROPLASTY;  Surgeon: Gearlean Alf, MD;  Location: [facility];  Service: Orthopedics;  Laterality: Left;    Prior to Admission medications   Medication Sig Start Date End Date Taking? Authorizing Provider  Accu-Chek Softclix Lancets lancets 1 each by Other route as needed. 03/27/19   [provider]  amLODipine (NORVASC) 10 MG tablet Take 10 mg by mouth daily. At 1500    [provider]  Cholecalciferol (VITAMIN D PO) Take 2,000 Units by mouth daily after breakfast.     [provider]  fenofibrate (  TRICOR) 145 MG tablet Take 145 mg by mouth daily after breakfast.    [provider]  hydrALAZINE (APRESOLINE) 25 MG tablet Take 25 mg by mouth 3 (three) times daily. After breakfast, at 1500 and at bedtime    [provider]  hydrochlorothiazide (HYDRODIURIL) 12.5 MG tablet Take 12.5 mg by mouth daily after breakfast.     [provider]  levothyroxine (SYNTHROID, LEVOTHROID) 112 MCG tablet Take 112 mcg by mouth daily before breakfast.    [provider]  metFORMIN (GLUCOPHAGE) 500 MG tablet Take 1 tablet by mouth daily.  06/12/17   [provider]  nitroGLYCERIN (NITROSTAT) 0.4 MG SL  tablet Place 1 tablet (0.4 mg total) under the tongue every 5 (five) minutes as needed for chest pain. 10/13/19   Josue Hector, MD  Omega-3 Fatty Acids (FISH OIL PO) Take 1 tablet by mouth daily.    [provider]  omeprazole (PRILOSEC) 20 MG capsule Take 20 mg by mouth daily after breakfast.    [provider]  prednisoLONE acetate (PRED FORTE) 1 % ophthalmic suspension Place 1 drop into both eyes every morning.    [provider]  sertraline (ZOLOFT) 50 MG tablet Take 50 mg by mouth daily.    [provider]  traMADol (ULTRAM) 50 MG tablet Take 1 tablet by mouth 3 (three) times daily as needed. Take 1 tablet 3 times a day by oral route as needed for pain for 5 days.    [provider]  TRUE METRIX BLOOD GLUCOSE TEST test strip 1 each by Other route daily. 03/27/19   [provider]    Allergies Losartan and Tape  Family History  Problem Relation Age of Onset  . Cancer Mother   . Diabetes Mother   . Blindness Brother        partial from menigitis    Social History Social History   Tobacco Use  . Smoking status: Never Smoker  . Smokeless tobacco: Never Used  Vaping Use  . Vaping Use: Never used  Substance Use Topics  . Alcohol use: No  . Drug use: No    Review of Systems Constitutional: No fever/chills Eyes: No visual changes. ENT: No sore throat. Cardiovascular: Denies chest pain. Respiratory: Denies shortness of breath. Gastrointestinal: No abdominal pain.  No nausea, no vomiting.  No diarrhea.  No constipation. Genitourinary: Negative for dysuria. Musculoskeletal: Negative for back pain. Skin: Negative for rash.  Forehead laceration. Neurological: Negative for headaches, focal weakness or numbness. Endocrine:  Diabetes, hyperlipidemia, hypertension, hypothyroidism. Allergic/Immunilogical: Losartan intake ____________________________________________   PHYSICAL EXAM:  VITAL SIGNS: ED Triage Vitals  Enc  Vitals Group     BP 06/13/20 0146 (!) 160/61     Pulse Rate 06/13/20 0146 79     Resp 06/13/20 0146 18     Temp 06/13/20 0146 99 F (37.2 C)     Temp Source 06/13/20 0146 Oral     SpO2 06/13/20 0146 97 %     Weight 06/13/20 0142 133 lb (60.3 kg)     Height 06/13/20 0142 5\' 1"  (1.549 m)     Head Circumference --      Peak Flow --      Pain Score 06/13/20 0141 0     Pain Loc --      Pain Edu? --      Excl. in [place]? --     Constitutional: Alert and oriented. Well appearing and in no acute distress. Eyes: Conjunctivae are  normal. PERRL. EOMI. Head: Atraumatic. Neck: No stridor.  No cervical spine tenderness to palpation. Cardiovascular: Normal rate, regular rhythm. Grossly normal heart sounds.  Good peripheral circulation.  Elevated blood pressure. Respiratory: Normal respiratory effort.  No retractions. Lungs CTAB. Gastrointestinal: Soft and nontender. No distention. No abdominal bruits. No CVA tenderness. Musculoskeletal: No lower extremity tenderness nor edema.  No joint effusions. Neurologic:  Normal speech and language. No gross focal neurologic deficits are appreciated. No gait instability. Skin: 3 cm laceration left lateral forehead.   Psychiatric: Mood and affect are normal. Speech and behavior are normal.  ____________________________________________   LABS (all labs ordered are listed, but only abnormal results are displayed)  Labs Reviewed - No data to display ____________________________________________  EKG   ____________________________________________  RADIOLOGY I, Sable Feil, personally viewed and evaluated these images (plain radiographs) as part of my medical decision making, as well as reviewing the written report by the radiologist.  ED MD interpretation:    Official radiology report(s): CT Head Wo Contrast  Result Date: 06/13/2020 CLINICAL DATA:  falling hitting head on wooden foundation of bed; EXAM: CT HEAD WITHOUT CONTRAST CT CERVICAL SPINE  WITHOUT CONTRAST TECHNIQUE: Multidetector CT imaging of the head and cervical spine was performed following the standard protocol without intravenous contrast. Multiplanar CT image reconstructions of the cervical spine were also generated. COMPARISON:  None. FINDINGS: CT HEAD FINDINGS Brain: Cerebral ventricle sizes are concordant with the degree of cerebral volume loss. Patchy and confluent areas of decreased attenuation are noted throughout the deep and periventricular white matter of the cerebral hemispheres bilaterally, compatible with chronic microvascular ischemic disease. No evidence of large-territorial acute infarction. No parenchymal hemorrhage. No mass lesion. No extra-axial collection. No mass effect or midline shift. No hydrocephalus. Basilar cisterns are patent. Vascular: No hyperdense vessel. Atherosclerotic calcifications are present within the cavernous internal carotid and vertebral arteries. Skull: No acute fracture or focal lesion. Sinuses/Orbits: Paranasal sinuses and mastoid air cells are clear. Bilateral lens replacement. Otherwise the orbits are unremarkable. Other: None. CT CERVICAL SPINE FINDINGS Alignment: Normal. Skull base and vertebrae: Multilevel moderate degenerative changes of the spine. No acute fracture. No aggressive appearing focal osseous lesion or focal pathologic process. Soft tissues and spinal canal: No prevertebral fluid or swelling. No visible canal hematoma. Upper chest: 4 mm right apical pulmonary nodule. Other: None. IMPRESSION: 1. No acute intracranial abnormality. 2. No acute displaced fracture or traumatic listhesis of the cervical spine. Electronically Signed   By: Iven Finn M.D.   On: 06/13/2020 04:09   CT Cervical Spine Wo Contrast  Result Date: 06/13/2020 CLINICAL DATA:  falling hitting head on wooden foundation of bed; EXAM: CT HEAD WITHOUT CONTRAST CT CERVICAL SPINE WITHOUT CONTRAST TECHNIQUE: Multidetector CT imaging of the head and cervical spine was  performed following the standard protocol without intravenous contrast. Multiplanar CT image reconstructions of the cervical spine were also generated. COMPARISON:  None. FINDINGS: CT HEAD FINDINGS Brain: Cerebral ventricle sizes are concordant with the degree of cerebral volume loss. Patchy and confluent areas of decreased attenuation are noted throughout the deep and periventricular white matter of the cerebral hemispheres bilaterally, compatible with chronic microvascular ischemic disease. No evidence of large-territorial acute infarction. No parenchymal hemorrhage. No mass lesion. No extra-axial collection. No mass effect or midline shift. No hydrocephalus. Basilar cisterns are patent. Vascular: No hyperdense vessel. Atherosclerotic calcifications are present within the cavernous internal carotid and vertebral arteries. Skull: No acute fracture or focal lesion. Sinuses/Orbits: Paranasal sinuses and mastoid air cells  are clear. Bilateral lens replacement. Otherwise the orbits are unremarkable. Other: None. CT CERVICAL SPINE FINDINGS Alignment: Normal. Skull base and vertebrae: Multilevel moderate degenerative changes of the spine. No acute fracture. No aggressive appearing focal osseous lesion or focal pathologic process. Soft tissues and spinal canal: No prevertebral fluid or swelling. No visible canal hematoma. Upper chest: 4 mm right apical pulmonary nodule. Other: None. IMPRESSION: 1. No acute intracranial abnormality. 2. No acute displaced fracture or traumatic listhesis of the cervical spine. Electronically Signed   By: Iven Finn M.D.   On: 06/13/2020 04:09    ____________________________________________   PROCEDURES  Procedure(s) performed (including Critical Care):  Marland KitchenMarland KitchenLaceration Repair  Date/Time: 06/13/2020 8:46 AM Performed by: Sable Feil, PA-C Authorized by: Sable Feil, PA-C   Consent:    Consent obtained:  Verbal   Consent given by:  Patient   Risks, benefits, and  alternatives were discussed: yes     Risks discussed:  Infection, pain, poor cosmetic result and need for additional repair Universal protocol:    Procedure explained and questions answered to patient or proxy's satisfaction: yes     Imaging studies available: yes     Immediately prior to procedure, a time out was called: yes     Patient identity confirmed:  Verbally with patient and arm band Anesthesia:    Anesthesia method:  Local infiltration   Local anesthetic:  Lidocaine 1% w/o epi Laceration details:    Location:  Face   Face location:  Forehead   Length (cm):  3   Depth (mm):  3 Pre-procedure details:    Preparation:  Patient was prepped and draped in usual sterile fashion and imaging obtained to evaluate for foreign bodies Exploration:    Limited defect created (wound extended): no     Hemostasis achieved with:  Direct pressure   Contaminated: no   Treatment:    Area cleansed with:  Povidone-iodine and saline   Amount of cleaning:  Standard   Debridement:  None   Undermining:  None   Scar revision: no   Skin repair:    Repair method:  Sutures   Suture size:  4-0   Suture material:  Prolene   Suture technique:  Simple interrupted   Number of sutures:  9 Approximation:    Approximation:  Close Repair type:    Repair type:  Simple Post-procedure details:    Dressing:  Non-adherent dressing   Procedure completion:  Tolerated well, no immediate complications     ____________________________________________   INITIAL IMPRESSION / ASSESSMENT AND PLAN / ED COURSE  As part of my medical decision making, I reviewed the following data within the electronic MEDICAL RECORD NUMBER        Patient presents with recently laceration left forehead.  Discussed no acute findings on CT of the head and cervical spine.  See procedure note for wound closure.  Patient given discharge care instruction advised return back in 5 days for suture removal.       ____________________________________________   FINAL CLINICAL IMPRESSION(S) / ED DIAGNOSES  Final diagnoses:  Contusion of face, initial encounter  Facial laceration, initial encounter     ED Discharge Orders    None       Note:  This document was prepared using Dragon voice recognition software and may include unintentional dictation errors.    Sable Feil, PA-C 06/13/20 9211    Lucrezia Starch, MD 06/13/20 1125

## 2020-06-13 NOTE — Discharge Instructions (Addendum)
Read and follow discharge care instructions.  Have sutures removed in 5 days.  Continue previous medication take extra Tylenol as needed for pain.

## 2020-06-14 DIAGNOSIS — E039 Hypothyroidism, unspecified: Secondary | ICD-10-CM | POA: Diagnosis not present

## 2020-06-14 DIAGNOSIS — D509 Iron deficiency anemia, unspecified: Secondary | ICD-10-CM | POA: Diagnosis not present

## 2020-06-14 DIAGNOSIS — R5383 Other fatigue: Secondary | ICD-10-CM | POA: Diagnosis not present

## 2020-06-14 DIAGNOSIS — R7989 Other specified abnormal findings of blood chemistry: Secondary | ICD-10-CM | POA: Diagnosis not present

## 2020-06-14 DIAGNOSIS — D72825 Bandemia: Secondary | ICD-10-CM | POA: Diagnosis not present

## 2020-06-14 DIAGNOSIS — Z9181 History of falling: Secondary | ICD-10-CM | POA: Diagnosis not present

## 2020-06-14 DIAGNOSIS — S0101XA Laceration without foreign body of scalp, initial encounter: Secondary | ICD-10-CM | POA: Diagnosis not present

## 2020-06-14 DIAGNOSIS — R2689 Other abnormalities of gait and mobility: Secondary | ICD-10-CM | POA: Diagnosis not present

## 2020-06-16 DIAGNOSIS — N390 Urinary tract infection, site not specified: Secondary | ICD-10-CM | POA: Diagnosis not present

## 2020-06-20 ENCOUNTER — Encounter: Payer: Self-pay | Admitting: Gastroenterology

## 2020-06-20 DIAGNOSIS — R5383 Other fatigue: Secondary | ICD-10-CM | POA: Diagnosis not present

## 2020-06-20 DIAGNOSIS — D509 Iron deficiency anemia, unspecified: Secondary | ICD-10-CM | POA: Diagnosis not present

## 2020-06-20 DIAGNOSIS — S0101XA Laceration without foreign body of scalp, initial encounter: Secondary | ICD-10-CM | POA: Diagnosis not present

## 2020-06-21 ENCOUNTER — Ambulatory Visit (INDEPENDENT_AMBULATORY_CARE_PROVIDER_SITE_OTHER): Payer: Medicare Other | Admitting: Internal Medicine

## 2020-06-21 ENCOUNTER — Other Ambulatory Visit (INDEPENDENT_AMBULATORY_CARE_PROVIDER_SITE_OTHER): Payer: Medicare Other

## 2020-06-21 ENCOUNTER — Encounter: Payer: Self-pay | Admitting: Internal Medicine

## 2020-06-21 VITALS — BP 108/60 | HR 72 | Ht 58.25 in | Wt 133.1 lb

## 2020-06-21 DIAGNOSIS — R6889 Other general symptoms and signs: Secondary | ICD-10-CM | POA: Diagnosis not present

## 2020-06-21 DIAGNOSIS — D649 Anemia, unspecified: Secondary | ICD-10-CM

## 2020-06-21 DIAGNOSIS — R5382 Chronic fatigue, unspecified: Secondary | ICD-10-CM | POA: Diagnosis not present

## 2020-06-21 LAB — CBC WITH DIFFERENTIAL/PLATELET
Basophils Absolute: 0.1 10*3/uL (ref 0.0–0.1)
Basophils Relative: 0.6 % (ref 0.0–3.0)
Eosinophils Absolute: 0.1 10*3/uL (ref 0.0–0.7)
Eosinophils Relative: 1.1 % (ref 0.0–5.0)
HCT: 28.2 % — ABNORMAL LOW (ref 36.0–46.0)
Hemoglobin: 9.1 g/dL — ABNORMAL LOW (ref 12.0–15.0)
Lymphocytes Relative: 42.8 % (ref 12.0–46.0)
Lymphs Abs: 4.3 10*3/uL — ABNORMAL HIGH (ref 0.7–4.0)
MCHC: 32.2 g/dL (ref 30.0–36.0)
MCV: 76.6 fl — ABNORMAL LOW (ref 78.0–100.0)
Monocytes Absolute: 0.8 10*3/uL (ref 0.1–1.0)
Monocytes Relative: 7.9 % (ref 3.0–12.0)
Neutro Abs: 4.8 10*3/uL (ref 1.4–7.7)
Neutrophils Relative %: 47.6 % (ref 43.0–77.0)
Platelets: 590 10*3/uL — ABNORMAL HIGH (ref 150.0–400.0)
RBC: 3.68 Mil/uL — ABNORMAL LOW (ref 3.87–5.11)
RDW: 15 % (ref 11.5–15.5)
WBC: 10.2 10*3/uL (ref 4.0–10.5)

## 2020-06-21 LAB — SEDIMENTATION RATE: Sed Rate: 67 mm/hr — ABNORMAL HIGH (ref 0–30)

## 2020-06-21 LAB — VITAMIN B12: Vitamin B-12: 268 pg/mL (ref 211–911)

## 2020-06-21 LAB — FERRITIN: Ferritin: 19.2 ng/mL (ref 10.0–291.0)

## 2020-06-21 NOTE — Progress Notes (Signed)
HISTORY OF PRESENT ILLNESS:  Diana Wood is a 87 y.o. female with multiple medical problems as listed below who is here today by Dr. Keane Police office regarding progressive anemia.  Patient is new to this office.  She was worked to [employer] office schedule.  She is accompanied by her daughter.  Review of outside records shows a CBC from November 05, 2019 with hemoglobin of 11.6 and MCV 86.6.  Hemoglobin from March 31, 2020 was 10.1 with MCV 87.  She was seen in the emergency room May 10, 2020 and diagnosed with COVID.  Not severe.  Blood work revealed anemia with hemoglobin 8.7 and MCV 81.5.  Normal creatinine.  The patient has a history of poor balance and falling.  She has severe fall with forehead laceration requiring stitches.  She has lost 6 pounds in 6 months.  She complains of progressive fatigue.  She was seen by her PCPs office and underwent blood work June 14, 2020.  Hemoglobin 8.9.  MCV 89.  Ferritin level 53.5.  Repeat CBC yesterday revealed hemoglobin 9.7 with MCV 81.2.  She was placed on an iron supplement yesterday.  She denies melena or hematochezia.  She does take omeprazole 40 mg daily for GERD.  She denies NSAID use.  She was recently treated for a UTI.  She tells me that she has undergone colonoscopy in the past, but no records available.  She denies headaches, temporal pain, or fevers.  No visual change.  REVIEW OF SYSTEMS:  All non-GI ROS negative unless otherwise stated in the HPI except for confusion, anxiety, hearing problems, excessive thirst, excessive urine nation  Past Medical History:  Diagnosis Date   Anemia    Arthritis    CAD (coronary artery disease)    a. s/p CABG 2008.   Chest pain    Deep vein thrombosis ([facility])    Diabetes mellitus    GERD (gastroesophageal reflux disease)    Hiatal hernia    Hypercholesterolemia    Hyperlipidemia    Hypertension    Hypothyroid    Obesity    Pleural effusion    Uterine cancer ([facility])     Past Surgical History:  Procedure  Laterality Date   ABDOMINAL HYSTERECTOMY     bilateral feet surgery     bilateral knee surgeries     CATARACT EXTRACTION, BILATERAL     CORONARY ARTERY BYPASS GRAFT     x 4-11'08-Dr. Bartle([facility])   endoscopic vein harvesting from both thighs     Gilford Raid MD   EP IMPLANTABLE DEVICE N/A 08/10/2015   Procedure: Pacemaker Implant;  Surgeon: Will Meredith Leeds, MD;  Location: [place] [facility] LAB;  Service: Cardiovascular;  Laterality: N/A;   excision cyst debridement fusion distal interphalangeal joint ring finger  with Mini Acutrak screw 20 mm in length     EYE SURGERY     corneal transplant -right   hysterectomy  2000   dx of uterine cancer   JOINT REPLACEMENT     right knee replacement-Dr. supple   surgery on 2 fingers     r HAND   TOTAL KNEE ARTHROPLASTY Left 03/16/2012   Procedure: TOTAL KNEE ARTHROPLASTY;  Surgeon: Gearlean Alf, MD;  Location: [facility];  Service: Orthopedics;  Laterality: Left;    Social History CHANEL MCADAMS  reports that she has never smoked. She has never used smokeless tobacco. She reports that she does not drink alcohol and does not use drugs.  family history includes Blindness in her  brother; Cancer in her mother; Diabetes in her mother; Hyperlipidemia in her daughter; Thyroid disease in her daughter.  Allergies  Allergen Reactions   Ace Inhibitors     Other reaction(s): Cough   Statins     Other reaction(s): Myalgias   Tramadol Hcl     Other reaction(s): 01/08/17 AMS issue in [place] was related to Tramadol   Losartan Cough   Tape Rash    Band aids ok, paper tape ok,       PHYSICAL EXAMINATION: Vital signs: BP 108/60 (BP Location: Left Arm, Patient Position: Sitting, Cuff Size: Normal)   Pulse 72   Ht 4' 10.25" (1.48 m) Comment: height measured without shoes  Wt 133 lb 2 oz (60.4 kg)   BMI 27.58 kg/m   Constitutional: generally well-appearing, no acute distress Psychiatric: alert and oriented x3, cooperative Head: Bandage on left  forehead Eyes: extraocular movements intact, anicteric, conjunctiva pink Mouth: oral pharynx moist, no lesions Neck: supple no lymphadenopathy Cardiovascular: heart regular rate and rhythm, no murmur Lungs: clear to auscultation bilaterally Abdomen: soft, nontender, nondistended, no obvious ascites, no peritoneal signs, normal bowel sounds, no organomegaly Rectal: Omitted Extremities: no clubbing, cyanosis, or lower extremity edema bilaterally Skin: no lesions on visible extremities Neuro: No focal deficits.  Cranial nerves intact  ASSESSMENT:  1.  Progressive anemia with progressive decline in MCV.  Suggestive of iron deficiency.  I suspect this is the cause for her fatigue. 2.  Recent nonsevere COVID illness 3.  Reports remote colonoscopy 4.  Advanced age 50.  General medical problems   PLAN:  1.  B12, ferritin, CBC, and ESR today 2.  Hemoccult cards x3 3.  Try to find prior colonoscopy report 4.  Further recommendations and follow-up after the above.  Patient will likely require endoscopic evaluations.  Holding off at this particular moment since she is recovering from [place] and acute head injury.  She is not acutely bleeding.  She has just started iron.  A total time of 60 minutes was spent preparing to see the patient, reviewing a myriad of tests, obtaining comprehensive history, reviewing outside emergency and office evaluations, performing medically appropriate physical examination, counseling the patient and her daughter regarding the above listed issues, ordering advanced laboratories and stool studies, requesting additional post evaluation outside records, and documenting clinical information in the record.

## 2020-06-21 NOTE — Patient Instructions (Signed)
If you are age 85 or older, your body mass index should be between 23-30. Your Body mass index is 27.58 kg/m. If this is out of the aforementioned range listed, please consider follow up with your Primary Care Provider.  If you are age 15 or younger, your body mass index should be between 19-25. Your Body mass index is 27.58 kg/m. If this is out of the aformentioned range listed, please consider follow up with your Primary Care Provider.   __________________________________________________________  The [facility] providers would like to encourage you to use [facility] to communicate with providers for non-urgent requests or questions.  Due to long hold times on the telephone, sending your provider a message by [facility] may be a faster and more efficient way to get a response.  Please allow 48 business hours for a response.  Please remember that this is for non-urgent requests.   Your provider has requested that you go to the basement level for lab work before leaving today. Press "B" on the elevator. The lab is located at the first door on the left as you exit the elevator.

## 2020-06-23 ENCOUNTER — Other Ambulatory Visit: Payer: Self-pay

## 2020-06-23 DIAGNOSIS — D649 Anemia, unspecified: Secondary | ICD-10-CM

## 2020-06-23 MED ORDER — CYANOCOBALAMIN 1000 MCG/ML IJ SOLN: 1000.0000 ug | Freq: Once | INTRAMUSCULAR | 3 refills | Status: DC

## 2020-06-30 ENCOUNTER — Other Ambulatory Visit (INDEPENDENT_AMBULATORY_CARE_PROVIDER_SITE_OTHER): Payer: Medicare Other

## 2020-06-30 ENCOUNTER — Ambulatory Visit (INDEPENDENT_AMBULATORY_CARE_PROVIDER_SITE_OTHER): Payer: Medicare Other | Admitting: Internal Medicine

## 2020-06-30 DIAGNOSIS — D649 Anemia, unspecified: Secondary | ICD-10-CM

## 2020-06-30 DIAGNOSIS — E538 Deficiency of other specified B group vitamins: Secondary | ICD-10-CM | POA: Diagnosis not present

## 2020-06-30 LAB — HEMOCCULT SLIDES (X 3 CARDS)
Fecal Occult Blood: POSITIVE — AB
OCCULT 1: POSITIVE — AB
OCCULT 2: POSITIVE — AB
OCCULT 3: POSITIVE — AB
OCCULT 4: POSITIVE — AB
OCCULT 5: POSITIVE — AB

## 2020-06-30 MED ORDER — CYANOCOBALAMIN 1000 MCG/ML IJ SOLN
1000.0000 ug | Freq: Once | INTRAMUSCULAR | Status: AC
  Administered 2020-06-30: 1000 ug via INTRAMUSCULAR

## 2020-07-07 ENCOUNTER — Other Ambulatory Visit (INDEPENDENT_AMBULATORY_CARE_PROVIDER_SITE_OTHER): Payer: Medicare Other

## 2020-07-07 DIAGNOSIS — D649 Anemia, unspecified: Secondary | ICD-10-CM | POA: Diagnosis not present

## 2020-07-07 LAB — CBC WITH DIFFERENTIAL/PLATELET
Basophils Absolute: 0.1 10*3/uL (ref 0.0–0.1)
Basophils Relative: 0.6 % (ref 0.0–3.0)
Eosinophils Absolute: 0.1 10*3/uL (ref 0.0–0.7)
Eosinophils Relative: 0.7 % (ref 0.0–5.0)
HCT: 29.7 % — ABNORMAL LOW (ref 36.0–46.0)
Hemoglobin: 9.6 g/dL — ABNORMAL LOW (ref 12.0–15.0)
Lymphocytes Relative: 42.3 % (ref 12.0–46.0)
Lymphs Abs: 3.9 10*3/uL (ref 0.7–4.0)
MCHC: 32.2 g/dL (ref 30.0–36.0)
MCV: 77.9 fl — ABNORMAL LOW (ref 78.0–100.0)
Monocytes Absolute: 0.6 10*3/uL (ref 0.1–1.0)
Monocytes Relative: 6.8 % (ref 3.0–12.0)
Neutro Abs: 4.5 10*3/uL (ref 1.4–7.7)
Neutrophils Relative %: 49.6 % (ref 43.0–77.0)
Platelets: 316 10*3/uL (ref 150.0–400.0)
RBC: 3.82 Mil/uL — ABNORMAL LOW (ref 3.87–5.11)
RDW: 17.9 % — ABNORMAL HIGH (ref 11.5–15.5)
WBC: 9.1 10*3/uL (ref 4.0–10.5)

## 2020-07-10 DIAGNOSIS — Z20822 Contact with and (suspected) exposure to covid-19: Secondary | ICD-10-CM | POA: Diagnosis not present

## 2020-07-11 ENCOUNTER — Other Ambulatory Visit: Payer: Self-pay

## 2020-07-11 DIAGNOSIS — D649 Anemia, unspecified: Secondary | ICD-10-CM

## 2020-07-11 DIAGNOSIS — E538 Deficiency of other specified B group vitamins: Secondary | ICD-10-CM

## 2020-07-14 ENCOUNTER — Other Ambulatory Visit: Payer: Self-pay

## 2020-07-14 ENCOUNTER — Ambulatory Visit (INDEPENDENT_AMBULATORY_CARE_PROVIDER_SITE_OTHER): Payer: Medicare Other | Admitting: Internal Medicine

## 2020-07-14 ENCOUNTER — Other Ambulatory Visit (INDEPENDENT_AMBULATORY_CARE_PROVIDER_SITE_OTHER): Payer: Medicare Other

## 2020-07-14 DIAGNOSIS — E538 Deficiency of other specified B group vitamins: Secondary | ICD-10-CM

## 2020-07-14 DIAGNOSIS — D649 Anemia, unspecified: Secondary | ICD-10-CM

## 2020-07-14 LAB — CBC WITH DIFFERENTIAL/PLATELET
Basophils Absolute: 0 10*3/uL (ref 0.0–0.1)
Basophils Relative: 0.6 % (ref 0.0–3.0)
Eosinophils Absolute: 0.1 10*3/uL (ref 0.0–0.7)
Eosinophils Relative: 1.2 % (ref 0.0–5.0)
HCT: 29.5 % — ABNORMAL LOW (ref 36.0–46.0)
Hemoglobin: 9.7 g/dL — ABNORMAL LOW (ref 12.0–15.0)
Lymphocytes Relative: 41.1 % (ref 12.0–46.0)
Lymphs Abs: 3.2 10*3/uL (ref 0.7–4.0)
MCHC: 33 g/dL (ref 30.0–36.0)
MCV: 78.2 fl (ref 78.0–100.0)
Monocytes Absolute: 0.8 10*3/uL (ref 0.1–1.0)
Monocytes Relative: 10.2 % (ref 3.0–12.0)
Neutro Abs: 3.6 10*3/uL (ref 1.4–7.7)
Neutrophils Relative %: 46.9 % (ref 43.0–77.0)
Platelets: 469 10*3/uL — ABNORMAL HIGH (ref 150.0–400.0)
RBC: 3.77 Mil/uL — ABNORMAL LOW (ref 3.87–5.11)
RDW: 18 % — ABNORMAL HIGH (ref 11.5–15.5)
WBC: 7.7 10*3/uL (ref 4.0–10.5)

## 2020-07-14 LAB — FERRITIN: Ferritin: 25.1 ng/mL (ref 10.0–291.0)

## 2020-07-14 LAB — VITAMIN B12: Vitamin B-12: 339 pg/mL (ref 211–911)

## 2020-07-14 MED ORDER — CYANOCOBALAMIN 1000 MCG/ML IJ SOLN
1000.0000 ug | Freq: Once | INTRAMUSCULAR | Status: AC
  Administered 2020-07-14: 1000 ug via INTRAMUSCULAR

## 2020-07-19 ENCOUNTER — Ambulatory Visit: Payer: Medicare Other | Admitting: Gastroenterology

## 2020-07-21 ENCOUNTER — Telehealth: Payer: Self-pay | Admitting: Internal Medicine

## 2020-07-21 NOTE — Telephone Encounter (Signed)
Left message for pts daughter to call back.  Spoke with pts daughter and let her know there are refills on the B12 prescription so they should just be able to call the pharmacy for a refill.

## 2020-07-21 NOTE — Telephone Encounter (Signed)
[name] Coralyn Mark called to inform that pt needs a B12 shot next Friday and they do not have the medication to bring it to the office yet. Pls call her [phone].

## 2020-07-27 ENCOUNTER — Other Ambulatory Visit: Payer: Self-pay | Admitting: Internal Medicine

## 2020-07-28 ENCOUNTER — Other Ambulatory Visit (INDEPENDENT_AMBULATORY_CARE_PROVIDER_SITE_OTHER): Payer: Medicare Other

## 2020-07-28 ENCOUNTER — Ambulatory Visit (INDEPENDENT_AMBULATORY_CARE_PROVIDER_SITE_OTHER): Payer: Medicare Other | Admitting: Internal Medicine

## 2020-07-28 DIAGNOSIS — D649 Anemia, unspecified: Secondary | ICD-10-CM

## 2020-07-28 DIAGNOSIS — E538 Deficiency of other specified B group vitamins: Secondary | ICD-10-CM

## 2020-07-28 LAB — CBC WITH DIFFERENTIAL/PLATELET
Basophils Absolute: 0.1 10*3/uL (ref 0.0–0.1)
Basophils Relative: 0.9 % (ref 0.0–3.0)
Eosinophils Absolute: 0.1 10*3/uL (ref 0.0–0.7)
Eosinophils Relative: 1.9 % (ref 0.0–5.0)
HCT: 30.3 % — ABNORMAL LOW (ref 36.0–46.0)
Hemoglobin: 9.8 g/dL — ABNORMAL LOW (ref 12.0–15.0)
Lymphocytes Relative: 46.1 % — ABNORMAL HIGH (ref 12.0–46.0)
Lymphs Abs: 3.1 10*3/uL (ref 0.7–4.0)
MCHC: 32.4 g/dL (ref 30.0–36.0)
MCV: 80.6 fl (ref 78.0–100.0)
Monocytes Absolute: 0.6 10*3/uL (ref 0.1–1.0)
Monocytes Relative: 8.6 % (ref 3.0–12.0)
Neutro Abs: 2.8 10*3/uL (ref 1.4–7.7)
Neutrophils Relative %: 42.5 % — ABNORMAL LOW (ref 43.0–77.0)
Platelets: 301 10*3/uL (ref 150.0–400.0)
RBC: 3.76 Mil/uL — ABNORMAL LOW (ref 3.87–5.11)
RDW: 19.6 % — ABNORMAL HIGH (ref 11.5–15.5)
WBC: 6.7 10*3/uL (ref 4.0–10.5)

## 2020-07-28 LAB — FERRITIN: Ferritin: 19.3 ng/mL (ref 10.0–291.0)

## 2020-07-28 MED ORDER — CYANOCOBALAMIN 1000 MCG/ML IJ SOLN
1000.0000 ug | Freq: Once | INTRAMUSCULAR | Status: AC
  Administered 2020-07-28: 1000 ug via INTRAMUSCULAR

## 2020-08-01 DIAGNOSIS — Z20822 Contact with and (suspected) exposure to covid-19: Secondary | ICD-10-CM | POA: Diagnosis not present

## 2020-08-02 ENCOUNTER — Other Ambulatory Visit: Payer: Self-pay

## 2020-08-02 ENCOUNTER — Ambulatory Visit (INDEPENDENT_AMBULATORY_CARE_PROVIDER_SITE_OTHER): Payer: Medicare Other | Admitting: Internal Medicine

## 2020-08-02 ENCOUNTER — Other Ambulatory Visit (INDEPENDENT_AMBULATORY_CARE_PROVIDER_SITE_OTHER): Payer: Medicare Other

## 2020-08-02 ENCOUNTER — Encounter: Payer: Self-pay | Admitting: Internal Medicine

## 2020-08-02 VITALS — BP 120/70 | HR 62 | Ht 61.0 in | Wt 126.8 lb

## 2020-08-02 DIAGNOSIS — R195 Other fecal abnormalities: Secondary | ICD-10-CM | POA: Diagnosis not present

## 2020-08-02 DIAGNOSIS — E538 Deficiency of other specified B group vitamins: Secondary | ICD-10-CM

## 2020-08-02 DIAGNOSIS — R5382 Chronic fatigue, unspecified: Secondary | ICD-10-CM

## 2020-08-02 DIAGNOSIS — D508 Other iron deficiency anemias: Secondary | ICD-10-CM

## 2020-08-02 DIAGNOSIS — R634 Abnormal weight loss: Secondary | ICD-10-CM | POA: Diagnosis not present

## 2020-08-02 LAB — CBC WITH DIFFERENTIAL/PLATELET
Basophils Absolute: 0.1 K/uL (ref 0.0–0.1)
Basophils Relative: 0.6 % (ref 0.0–3.0)
Eosinophils Absolute: 0.1 K/uL (ref 0.0–0.7)
Eosinophils Relative: 1.2 % (ref 0.0–5.0)
HCT: 31.6 % — ABNORMAL LOW (ref 36.0–46.0)
Hemoglobin: 10.2 g/dL — ABNORMAL LOW (ref 12.0–15.0)
Lymphocytes Relative: 46.3 % — ABNORMAL HIGH (ref 12.0–46.0)
Lymphs Abs: 3.9 K/uL (ref 0.7–4.0)
MCHC: 32.3 g/dL (ref 30.0–36.0)
MCV: 80.1 fl (ref 78.0–100.0)
Monocytes Absolute: 0.8 K/uL (ref 0.1–1.0)
Monocytes Relative: 8.9 % (ref 3.0–12.0)
Neutro Abs: 3.7 K/uL (ref 1.4–7.7)
Neutrophils Relative %: 43 % (ref 43.0–77.0)
Platelets: 416 K/uL — ABNORMAL HIGH (ref 150.0–400.0)
RBC: 3.95 Mil/uL (ref 3.87–5.11)
RDW: 19 % — ABNORMAL HIGH (ref 11.5–15.5)
WBC: 8.5 K/uL (ref 4.0–10.5)

## 2020-08-02 LAB — VITAMIN B12: Vitamin B-12: 713 pg/mL (ref 211–911)

## 2020-08-02 LAB — FERRITIN: Ferritin: 16.6 ng/mL (ref 10.0–291.0)

## 2020-08-02 MED ORDER — PLENVU 140 G PO SOLR: 1.0000 | ORAL | 0 refills | Status: DC

## 2020-08-02 NOTE — Progress Notes (Signed)
HISTORY OF PRESENT ILLNESS:  Diana Wood is a 87 y.o. female with multiple medical problems who was evaluated in this office June 21, 2020 with progressive anemia suggestive of iron deficiency.  This was associated with severe fatigue.  See that dictation for details.  Repeat CBC showed persistent anemia (hemoglobin 9.1) with microcytic indices (MCV 76.6).  Sedimentation rate was moderately elevated at 67.  Ferritin and B12 levels were low normal (19.2 and 268 respectively).  She was placed on iron supplementation as well as B12 supplementation.  She submitted Hemoccult cards which returned positive x 6.  We were able to receive outside colonoscopy report from Dr. Earlean Shawl dated August 10, 2003.  The examination was normal including random colon biopsies.  At the time of her initial office evaluation, the patient had recently sustained a forehead laceration and had recovered from nonsevere COVID.  She presents today for follow-up as requested.  She did have follow-up CBC most recently July 14, 2020 with hemoglobin 9.7.  MCV 78.2.  She is again accompanied by her daughter.  Again, GI review of systems is quite unremarkable.  No overt GI bleeding.  No abdominal pain.  They do report ongoing weight loss.  7 pounds from last visit.  She does have somewhat decreased appetite.  Because of a history of GERD she does take omeprazole 20 mg daily.  Currently on a short course of amoxicillin due to gingival infection after oral surgery.  REVIEW OF SYSTEMS:  All non-GI ROS negative unless otherwise stated in the HPI except for fatigue, arthritis,  Past Medical History:  Diagnosis Date   Anemia    Arthritis    CAD (coronary artery disease)    a. s/p CABG 2008.   Chest pain    Deep vein thrombosis (HCC)    Diabetes mellitus    GERD (gastroesophageal reflux disease)    Hiatal hernia    Hypercholesterolemia    Hyperlipidemia    Hypertension    Hypothyroid    Obesity    Pleural effusion    Uterine cancer (HCC)      Past Surgical History:  Procedure Laterality Date   ABDOMINAL HYSTERECTOMY     bilateral feet surgery     bilateral knee surgeries     CATARACT EXTRACTION, BILATERAL     CORONARY ARTERY BYPASS GRAFT     x 4-11'08-Dr. Bartle([facility])   endoscopic vein harvesting from both thighs     Gilford Raid MD   EP IMPLANTABLE DEVICE N/A 08/10/2015   Procedure: Pacemaker Implant;  Surgeon: Will Meredith Leeds, MD;  Location: [facility];  Service: Cardiovascular;  Laterality: N/A;   excision cyst debridement fusion distal interphalangeal joint ring finger  with Mini Acutrak screw 20 mm in length     EYE SURGERY     corneal transplant -right   hysterectomy  2000   dx of uterine cancer   JOINT REPLACEMENT     right knee replacement-Dr. supple   surgery on 2 fingers     r HAND   TOTAL KNEE ARTHROPLASTY Left 03/16/2012   Procedure: TOTAL KNEE ARTHROPLASTY;  Surgeon: Gearlean Alf, MD;  Location: [facility];  Service: Orthopedics;  Laterality: Left;    Social History Diana Wood  reports that she has never smoked. She has never used smokeless tobacco. She reports that she does not drink alcohol and does not use drugs.  family history includes Blindness in her brother; Cancer in her mother; Diabetes in her mother; Hyperlipidemia  in her daughter; Thyroid disease in her daughter.  Allergies  Allergen Reactions   Ace Inhibitors     Other reaction(s): Cough   Statins     Other reaction(s): Myalgias   Tramadol Hcl     Other reaction(s): 01/08/17 AMS issue in [place] was related to Tramadol   Losartan Cough   Tape Rash    Band aids ok, paper tape ok,       PHYSICAL EXAMINATION: Vital signs: BP 120/70   Pulse 62   Ht '5\' 1"'$  (1.549 m)   Wt 126 lb 12.8 oz (57.5 kg)   BMI 23.96 kg/m   Constitutional: Pleasant, thin, generally well-appearing elderly female, no acute distress Psychiatric: alert and oriented x3, cooperative Head: Well-healed scar from previous area of trauma Eyes:  extraocular movements intact, anicteric, conjunctiva pink Mouth: oral pharynx moist, no lesions Neck: supple no lymphadenopathy Cardiovascular: heart regular rate and rhythm, no murmur Lungs: clear to auscultation bilaterally Abdomen: soft, nontender, nondistended, no obvious ascites, no peritoneal signs, normal bowel sounds, no organomegaly Rectal: Deferred until colonoscopy Extremities: no clubbing, cyanosis, or lower extremity edema bilaterally Skin: no lesions on visible extremities Neuro: No focal deficits.  Cranial nerves intact  ASSESSMENT:  1.  Microcytic anemia likely iron deficiency.  On iron replacement.  No significant improvement in hemoglobin. 2.  Borderline B12 deficiency.  On replacement 3.  Hemoccult positive stool.  Rule out underlying GI mucosal abnormality 4.  GERD.  Asymptomatic on PPI 5.  Elevated sedimentation rate (67) 6.  Fatigue 7.  Weight loss 8.  Multiple medical problems 9.  Colonoscopy 2005.  Normal  PLAN:  1.  Arrange IV iron infusion (Feraheme). 2.  Continue oral iron and B12 replacement (every 2 weeks in our office currently) for now 3.  Repeat CBC, ferritin, and B12 level in 4 weeks 4.  Schedule colonoscopy to evaluate Hemoccult-positive stool and iron deficiency anemia.  Also weight loss.  Patient is high risk given her age and comorbidities.The nature of the procedure, as well as the risks, benefits, and alternatives were carefully and thoroughly reviewed with the patient. Ample time for discussion and questions allowed. The patient understood, was satisfied, and agreed to proceed.  5.  Schedule upper endoscopy to evaluate iron deficiency anemia and Hemoccult-positive stool.  Also weight loss.  Patient is high risk as above.The nature of the procedure, as well as the risks, benefits, and alternatives were carefully and thoroughly reviewed with the patient. Ample time for discussion and questions allowed. The patient understood, was satisfied, and  agreed to proceed.  6.  Reflux precautions 7.  Continue PPI. 8.  Hold diabetic medications the day of the procedure to avoid unwanted hypoglycemia 9.  Consider capsule endoscopy if the above studies unrevealing.  We discussed this today. 10..  Ongoing general medical care with Dr. Virgina Jock

## 2020-08-02 NOTE — Patient Instructions (Addendum)
If you are age 85 or older, your body mass index should be between 23-30. Your Body mass index is 23.96 kg/m. If this is out of the aforementioned range listed, please consider follow up with your Primary Care Provider. _________________________________________________________ The  GI providers would like to encourage you to use [facility] to communicate with providers for non-urgent requests or questions.  Due to long hold times on the telephone, sending your provider a message by [facility] may be a faster and more efficient way to get a response.  Please allow 48 business hours for a response.  Please remember that this is for non-urgent requests.   Your provider has requested that you go to the basement level for lab work in 4 weeks (week of August 22). Press "B" on the elevator. The lab is located at the first door on the left as you exit the elevator.   You have been scheduled for an endoscopy and colonoscopy. Please follow the written instructions given to you at your visit today. Please pick up your prep supplies at the pharmacy within the next 1-3 days. If you use inhalers (even only as needed), please bring them with you on the day of your procedure.  Hematology referral was made by Vaughan Basta, RN to schedule IV- Iron infusions.  Someone from hematology office will call you with an appointment.   Due to recent changes in healthcare laws, you may see the results of your imaging and laboratory studies on MyChart before your provider has had a chance to review them.  We understand that in some cases there may be results that are confusing or concerning to you. Not all laboratory results come back in the same time frame and the provider may be waiting for multiple results in order to interpret others.  Please give [place] 48 hours in order for your provider to thoroughly review all the results before contacting the office for clarification of your results.   Please hold Iron supplement for 7 days prior  to procedure starting on 09-06-2020.  Continue B12 injections.  Thank you for entrusting me with your care and choosing [facility].  Dr Henrene Pastor

## 2020-08-03 ENCOUNTER — Telehealth: Payer: Self-pay | Admitting: Hematology and Oncology

## 2020-08-03 NOTE — Telephone Encounter (Signed)
Received a new hem referral from Dr. Henrene Pastor for IDA. Ms. Diana Wood has been scheduled to see [name] on 8/5 at 2:20pm. Appt date and time has been given to the pt's daughter. Aware to arrive 20 minutes early.

## 2020-08-07 DIAGNOSIS — Z20822 Contact with and (suspected) exposure to covid-19: Secondary | ICD-10-CM | POA: Diagnosis not present

## 2020-08-08 DIAGNOSIS — Z20822 Contact with and (suspected) exposure to covid-19: Secondary | ICD-10-CM | POA: Diagnosis not present

## 2020-08-11 ENCOUNTER — Inpatient Hospital Stay: Payer: Medicare Other | Attending: Hematology and Oncology | Admitting: Hematology and Oncology

## 2020-08-11 ENCOUNTER — Other Ambulatory Visit: Payer: Self-pay

## 2020-08-11 ENCOUNTER — Ambulatory Visit (INDEPENDENT_AMBULATORY_CARE_PROVIDER_SITE_OTHER): Payer: Medicare Other | Admitting: Internal Medicine

## 2020-08-11 ENCOUNTER — Inpatient Hospital Stay: Payer: Medicare Other

## 2020-08-11 ENCOUNTER — Encounter: Payer: Self-pay | Admitting: Hematology and Oncology

## 2020-08-11 VITALS — BP 154/72 | HR 81 | Temp 97.8°F | Resp 18 | Ht 61.0 in | Wt 127.5 lb

## 2020-08-11 DIAGNOSIS — D509 Iron deficiency anemia, unspecified: Secondary | ICD-10-CM

## 2020-08-11 DIAGNOSIS — Z821 Family history of blindness and visual loss: Secondary | ICD-10-CM | POA: Diagnosis not present

## 2020-08-11 DIAGNOSIS — Z833 Family history of diabetes mellitus: Secondary | ICD-10-CM | POA: Diagnosis not present

## 2020-08-11 DIAGNOSIS — Z951 Presence of aortocoronary bypass graft: Secondary | ICD-10-CM | POA: Diagnosis not present

## 2020-08-11 DIAGNOSIS — Z90710 Acquired absence of both cervix and uterus: Secondary | ICD-10-CM | POA: Diagnosis not present

## 2020-08-11 DIAGNOSIS — I2510 Atherosclerotic heart disease of native coronary artery without angina pectoris: Secondary | ICD-10-CM

## 2020-08-11 DIAGNOSIS — Z8349 Family history of other endocrine, nutritional and metabolic diseases: Secondary | ICD-10-CM | POA: Diagnosis not present

## 2020-08-11 DIAGNOSIS — E538 Deficiency of other specified B group vitamins: Secondary | ICD-10-CM | POA: Diagnosis not present

## 2020-08-11 DIAGNOSIS — Z888 Allergy status to other drugs, medicaments and biological substances status: Secondary | ICD-10-CM

## 2020-08-11 DIAGNOSIS — Z86718 Personal history of other venous thrombosis and embolism: Secondary | ICD-10-CM

## 2020-08-11 DIAGNOSIS — Z809 Family history of malignant neoplasm, unspecified: Secondary | ICD-10-CM

## 2020-08-11 DIAGNOSIS — Z79899 Other long term (current) drug therapy: Secondary | ICD-10-CM

## 2020-08-11 DIAGNOSIS — D539 Nutritional anemia, unspecified: Secondary | ICD-10-CM

## 2020-08-11 DIAGNOSIS — E1021 Type 1 diabetes mellitus with diabetic nephropathy: Secondary | ICD-10-CM | POA: Diagnosis not present

## 2020-08-11 DIAGNOSIS — D500 Iron deficiency anemia secondary to blood loss (chronic): Secondary | ICD-10-CM

## 2020-08-11 LAB — RETIC PANEL
Immature Retic Fract: 9.9 % (ref 2.3–15.9)
RBC.: 3.88 MIL/uL (ref 3.87–5.11)
Retic Count, Absolute: 36.1 10*3/uL (ref 19.0–186.0)
Retic Ct Pct: 0.9 % (ref 0.4–3.1)
Reticulocyte Hemoglobin: 28.2 pg (ref >27.9)

## 2020-08-11 LAB — CMP (CANCER CENTER ONLY)
ALT: 16 U/L (ref 0–44)
AST: 41 U/L (ref 15–41)
Albumin: 3.6 g/dL (ref 3.5–5.0)
Alkaline Phosphatase: 41 U/L (ref 38–126)
Anion gap: 10 (ref 5–15)
BUN: 16 mg/dL (ref 8–23)
CO2: 25 mmol/L (ref 22–32)
Calcium: 10.1 mg/dL (ref 8.9–10.3)
Chloride: 106 mmol/L (ref 98–111)
Creatinine: 1.01 mg/dL — ABNORMAL HIGH (ref 0.44–1.00)
GFR, Estimated: 54 mL/min — ABNORMAL LOW (ref >60)
Glucose, Bld: 97 mg/dL (ref 70–99)
Potassium: 3.8 mmol/L (ref 3.5–5.1)
Sodium: 141 mmol/L (ref 135–145)
Total Bilirubin: 0.4 mg/dL (ref 0.3–1.2)
Total Protein: 7.1 g/dL (ref 6.5–8.1)

## 2020-08-11 LAB — CBC WITH DIFFERENTIAL (CANCER CENTER ONLY)
Abs Immature Granulocytes: 0.02 10*3/uL (ref 0.00–0.07)
Basophils Absolute: 0 10*3/uL (ref 0.0–0.1)
Basophils Relative: 0 %
Eosinophils Absolute: 0.1 10*3/uL (ref 0.0–0.5)
Eosinophils Relative: 2 %
HCT: 32.5 % — ABNORMAL LOW (ref 36.0–46.0)
Hemoglobin: 10.2 g/dL — ABNORMAL LOW (ref 12.0–15.0)
Immature Granulocytes: 0 %
Lymphocytes Relative: 49 %
Lymphs Abs: 4.5 10*3/uL — ABNORMAL HIGH (ref 0.7–4.0)
MCH: 26 pg (ref 26.0–34.0)
MCHC: 31.4 g/dL (ref 30.0–36.0)
MCV: 82.9 fL (ref 80.0–100.0)
Monocytes Absolute: 0.9 10*3/uL (ref 0.1–1.0)
Monocytes Relative: 9 %
Neutro Abs: 3.7 10*3/uL (ref 1.7–7.7)
Neutrophils Relative %: 40 %
Platelet Count: 387 10*3/uL (ref 150–400)
RBC: 3.92 MIL/uL (ref 3.87–5.11)
RDW: 17.5 % — ABNORMAL HIGH (ref 11.5–15.5)
WBC Count: 9.3 10*3/uL (ref 4.0–10.5)
nRBC: 0 % (ref 0.0–0.2)

## 2020-08-11 LAB — VITAMIN B12: Vitamin B-12: >7500 pg/mL — ABNORMAL HIGH (ref 180–914)

## 2020-08-11 LAB — FOLATE: Folate: 5 ng/mL — ABNORMAL LOW (ref >5.9)

## 2020-08-11 MED ORDER — CYANOCOBALAMIN 1000 MCG/ML IJ SOLN
1000.0000 ug | Freq: Once | INTRAMUSCULAR | Status: AC
  Administered 2020-08-11: 1000 ug via INTRAMUSCULAR

## 2020-08-11 NOTE — Progress Notes (Signed)
[place] [phone]   Fax:([phone]) [place] NOTE  Patient Care Team: [name], MD as PCP - General (Internal Medicine) Constance Haw, MD as PCP - Electrophysiology (Cardiology)  Hematological/Oncological History # Iron Deficiency Anemia 11/13/2006: WBC 8.2, Hgb 10.9, MCV 86.5, Plt 365 03/19/2012: WBC 11.6, Hgb 9.5, MCV 82.5, Plt 339 05/10/2020: WBC 10.3, Hgb 8.7, MCV 81.5, Plt 357 08/11/2020: establish care with Dr. Lorenso Courier. Hgb 10.2, Ferritin 18, TIBC 568  CHIEF COMPLAINTS/PURPOSE OF CONSULTATION:  "Iron deficiency anemia."  HISTORY OF PRESENTING ILLNESS:  Diana Wood 87 y.o. female with medical history significant for CAD, DVT, DM type II, GERD, hyperlipidemia, hypertension, and hypothyroidism who presents for evaluation of iron deficiency anemia.  On review of the previous records Diana Wood has a longstanding history of anemia dating all the way back to 2008.  Her baseline appears to be an approximately hemoglobin of 10.  Over the last year her hemoglobin has remained consistently low, with a hemoglobin of 9.1 on 06/21/2020 and a hemoglobin of 9.8 on 07/28/2020.  Most recently on 08/02/2020 the patient was found to have a hemoglobin 10.2, MCV of 80.1, and a platelet count of 416.  Due to concern for this longstanding normocytic anemia the patient was referred to hematology for further evaluation and management.  On exam today Diana Wood notes that she has been having issues with fatigue but not shortness of breath.  She notes that her legs get tired and she has to sit down.  She notes that if she walks from the kitchen to the bedroom she will need a break.  She notes that this has been progressively getting worse.  She "stays tired".  She has been on months worth of iron pills with no improvement in her energy levels or hemoglobin.  She notes that she has not had any overt signs of bleeding, bruising, or dark stools.  A full 10 point ROS is  listed below.  MEDICAL HISTORY:  Past Medical History:  Diagnosis Date   Anemia    Arthritis    CAD (coronary artery disease)    a. s/p CABG 2008.   Chest pain    Deep vein thrombosis (HCC)    Diabetes mellitus    GERD (gastroesophageal reflux disease)    Hiatal hernia    Hypercholesterolemia    Hyperlipidemia    Hypertension    Hypothyroid    Obesity    Pleural effusion    Uterine cancer (HCC)     SURGICAL HISTORY: Past Surgical History:  Procedure Laterality Date   ABDOMINAL HYSTERECTOMY     bilateral feet surgery     bilateral knee surgeries     CATARACT EXTRACTION, BILATERAL     CORONARY ARTERY BYPASS GRAFT     x 4-11'08-Dr. Bartle([facility])   endoscopic vein harvesting from both thighs     Gilford Raid MD   EP IMPLANTABLE DEVICE N/A 08/10/2015   Procedure: Pacemaker Implant;  Surgeon: Will Meredith Leeds, MD;  Location: [facility];  Service: Cardiovascular;  Laterality: N/A;   excision cyst debridement fusion distal interphalangeal joint ring finger  with Mini Acutrak screw 20 mm in length     EYE SURGERY     corneal transplant -right   hysterectomy  2000   dx of uterine cancer   JOINT REPLACEMENT     right knee replacement-Dr. supple   surgery on 2 fingers     r HAND   TOTAL KNEE ARTHROPLASTY  Left 03/16/2012   Procedure: TOTAL KNEE ARTHROPLASTY;  Surgeon: Gearlean Alf, MD;  Location: [facility];  Service: Orthopedics;  Laterality: Left;    SOCIAL HISTORY: Social History   Socioeconomic History   Marital status: Married    Spouse name: Not on file   Number of children: 3   Years of education: Not on file   Highest education level: Not on file  Occupational History   Occupation: retired    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  Tobacco Use   Smoking status: Never   Smokeless tobacco: Never  Vaping Use   Vaping Use: Never used  Substance and Sexual Activity   Alcohol use: No   Drug use: No   Sexual activity: Yes  Other Topics Concern   Not  on file  Social History Narrative   Not on file   Social Determinants of Health   Financial Resource Strain: Not on file  Food Insecurity: Not on file  Transportation Needs: Not on file  Physical Activity: Not on file  Stress: Not on file  Social Connections: Not on file  Intimate Partner Violence: Not on file    FAMILY HISTORY: Family History  Problem Relation Age of Onset   Cancer Mother        type unknown   Diabetes Mother    Blindness Brother        partial from menigitis   Hyperlipidemia Daughter    Thyroid disease Daughter     ALLERGIES:  is allergic to ace inhibitors, statins, tramadol hcl, losartan, and tape.  MEDICATIONS:  Current Outpatient Medications  Medication Sig Dispense Refill   Accu-Chek Softclix Lancets lancets 1 each by Other route as needed.     amLODipine (NORVASC) 10 MG tablet Take 10 mg by mouth daily. At 1500     Cholecalciferol (VITAMIN D PO) Take 2,000 Units by mouth daily after breakfast.      cyanocobalamin (,VITAMIN B-12,) 1000 MCG/ML injection Inject into the muscle every 14 (fourteen) days.     fenofibrate (TRICOR) 145 MG tablet Take 145 mg by mouth daily after breakfast.     ferrous sulfate 325 (65 FE) MG tablet Take 1 tablet by mouth daily.     fluorouracil (EFUDEX) 5 % cream Apply 1 application topically at bedtime as needed.     hydrALAZINE (APRESOLINE) 25 MG tablet Take 25 mg by mouth 3 (three) times daily. After breakfast, at 1500 and at bedtime     hydrochlorothiazide (HYDRODIURIL) 12.5 MG tablet Take 12.5 mg by mouth daily after breakfast.      levothyroxine (SYNTHROID) 125 MCG tablet Take 125 mcg by mouth daily.     metFORMIN (GLUCOPHAGE) 500 MG tablet Take 1 tablet by mouth daily.      nitroGLYCERIN (NITROSTAT) 0.4 MG SL tablet Place 1 tablet (0.4 mg total) under the tongue every 5 (five) minutes as needed for chest pain. 25 tablet 3   Omega-3 Fatty Acids (FISH OIL PO) Take 1 tablet by mouth daily.     omeprazole (PRILOSEC) 20 MG  capsule Take 20 mg by mouth daily after breakfast.     PEG-KCl-NaCl-NaSulf-Na Asc-C (PLENVU) 140 g SOLR Take 1 kit by mouth as directed. Use coupon: BIN: 500938 PNC: CNRX Group: [id number] ID: [id number] 1 each 0   prednisoLONE acetate (PRED FORTE) 1 % ophthalmic suspension Place 1 drop into both eyes every morning.     sertraline (ZOLOFT) 100 MG tablet Take 1 tablet by mouth daily.  TRUE METRIX BLOOD GLUCOSE TEST test strip 1 each by Other route daily.     No current facility-administered medications for this visit.    REVIEW OF SYSTEMS:   Constitutional: ( - ) fevers, ( - )  chills , ( - ) night sweats Eyes: ( - ) blurriness of vision, ( - ) double vision, ( - ) watery eyes Ears, nose, mouth, throat, and face: ( - ) mucositis, ( - ) sore throat Respiratory: ( - ) cough, ( - ) dyspnea, ( - ) wheezes Cardiovascular: ( - ) palpitation, ( - ) chest discomfort, ( - ) lower extremity swelling Gastrointestinal:  ( - ) nausea, ( - ) heartburn, ( - ) change in bowel habits Skin: ( - ) abnormal skin rashes Lymphatics: ( - ) new lymphadenopathy, ( - ) easy bruising Neurological: ( - ) numbness, ( - ) tingling, ( - ) new weaknesses Behavioral/Psych: ( - ) mood change, ( - ) new changes  All other systems were reviewed with the patient and are negative.  PHYSICAL EXAMINATION:  Vitals:   08/11/20 1416  BP: (!) 154/72  Pulse: 81  Resp: 18  Temp: 97.8 F (36.6 C)  SpO2: 100%   Filed Weights   08/11/20 1416  Weight: 127 lb 8 oz (57.8 kg)    GENERAL: well appearing elderly Caucasian female in NAD  SKIN: skin color, texture, turgor are normal, no rashes or significant lesions EYES: conjunctiva are pink and non-injected, sclera clear LUNGS: clear to auscultation and percussion with normal breathing effort HEART: regular rate & rhythm and no murmurs and no lower extremity edema PSYCH: alert & oriented x 3, fluent speech NEURO: no focal motor/sensory deficits  LABORATORY DATA:  I have  reviewed the data as listed CBC Latest Ref Rng & Units 08/02/2020 07/28/2020 07/14/2020  WBC 4.0 - 10.5 K/uL 8.5 6.7 7.7  Hemoglobin 12.0 - 15.0 g/dL 10.2(L) 9.8(L) 9.7(L)  Hematocrit 36.0 - 46.0 % 31.6(L) 30.3(L) 29.5(L)  Platelets 150.0 - 400.0 K/uL 416.0(H) 301.0 469.0(H)    CMP Latest Ref Rng & Units 05/10/2020 08/04/2015 03/19/2012  Glucose 70 - 99 mg/dL 116(H) 149(H) 151(H)  BUN 8 - 23 mg/dL 27(H) 18 18  Creatinine 0.44 - 1.00 mg/dL 1.00 0.88 0.76  Sodium 135 - 145 mmol/L 136 139 135  Potassium 3.5 - 5.1 mmol/L 3.6 4.2 3.9  Chloride 98 - 111 mmol/L 100 100 99  CO2 22 - 32 mmol/L _0 Calcium 8.9 - 10.3 mg/dL 9.4 9.8 9.2  Total Protein 6.5 - 8.1 g/dL 7.4 - -  Total Bilirubin 0.3 - 1.2 mg/dL 0.6 - -  Alkaline Phos 38 - 126 U/L 35(L) - -  AST 15 - 41 U/L 61(H) - -  ALT 0 - 44 U/L 22 - -    RADIOGRAPHIC STUDIES: No results found.  ASSESSMENT & PLAN KATRIANNA FRIESENHAHN 87 y.o. female with medical history significant for CAD, DVT, DM type II, GERD, hyperlipidemia, hypertension, and hypothyroidism who presents for evaluation of iron deficiency anemia.  After review of the labs, review of the records, and discussion with the patient the patients findings are most consistent with iron deficiency anemia of unclear etiology.  The patient's ferritin was 18 with an iron sat of 9% and total iron binding capacity of 568.  The patient has been on p.o. iron therapy but this is not adequately bolstered her iron levels.  Therefore I recommend that we transition her to IV iron therapy.  May need to consider GI evaluation to determine the source of the patient's blood loss.  We will plan to have the patient return to clinic approximate 4 weeks after last dose of IV iron.  # Iron Deficiency Anemia -- Patient has been on p.o. iron therapy but has not had an appreciable increase in her hemoglobin or iron levels --We will set up the patient for IV Feraheme 510 mg q. 7 days x 2 doses --Etiology of her iron  deficiency is unclear.  Given her advanced age would need to have a risk-benefit discussion before referring to gastroenterology for consideration of diagnostic procedures --Plan have the patient return to clinic approximately 4 to 6 weeks after last dose of IV iron  Orders Placed This Encounter  Procedures   CBC with Differential ([place] Only)    Standing Status:   Future    Standing Expiration Date:   08/11/2021   CMP ([place] only)    Standing Status:   Future    Standing Expiration Date:   08/11/2021   Ferritin    Standing Status:   Future    Standing Expiration Date:   08/11/2021   Iron and TIBC    Standing Status:   Future    Standing Expiration Date:   08/11/2021   Retic Panel    Standing Status:   Future    Standing Expiration Date:   08/11/2021   Vitamin B12    Standing Status:   Future    Standing Expiration Date:   08/11/2021   Folate, Serum    Standing Status:   Future    Standing Expiration Date:   08/11/2021    All questions were answered. The patient knows to call the clinic with any problems, questions or concerns.  A total of more than 60 minutes were spent on this encounter with face-to-face time and non-face-to-face time, including preparing to see the patient, ordering tests and/or medications, counseling the patient and coordination of care as outlined above.   Ledell Peoples, MD Department of Hematology/Oncology [place] [facility] Phone: [phone] Pager: [phone] Email: [email or web address]  08/11/2020 3:18 PM

## 2020-08-14 LAB — IRON AND TIBC
Iron: 51 ug/dL (ref 41–142)
Saturation Ratios: 9 % — ABNORMAL LOW (ref 21–57)
TIBC: 568 ug/dL — ABNORMAL HIGH (ref 236–444)
UIBC: 517 ug/dL — ABNORMAL HIGH (ref 120–384)

## 2020-08-14 LAB — FERRITIN: Ferritin: 18 ng/mL (ref 11–307)

## 2020-08-15 ENCOUNTER — Telehealth: Payer: Self-pay | Admitting: *Deleted

## 2020-08-15 NOTE — Telephone Encounter (Signed)
Patients daughter Reynolds Bowl called about appointments for infusions that need to be scheduled.    Upon reviewing chart, office note is still pending/incomplete, no los.   Routing to MD and pod nurse to return call to patient once plan of care has been documented.

## 2020-08-16 ENCOUNTER — Ambulatory Visit (INDEPENDENT_AMBULATORY_CARE_PROVIDER_SITE_OTHER): Payer: Medicare Other

## 2020-08-16 ENCOUNTER — Encounter: Payer: Self-pay | Admitting: Hematology and Oncology

## 2020-08-16 ENCOUNTER — Telehealth: Payer: Self-pay | Admitting: *Deleted

## 2020-08-16 DIAGNOSIS — D500 Iron deficiency anemia secondary to blood loss (chronic): Secondary | ICD-10-CM | POA: Insufficient documentation

## 2020-08-16 DIAGNOSIS — I442 Atrioventricular block, complete: Secondary | ICD-10-CM

## 2020-08-16 NOTE — Telephone Encounter (Signed)
[name] called again today regarding getting her mother's IV iron scheduled. She was expecting to have it done this week. No notes, LOS or orders in for this yet.  Please advise.  Thanks

## 2020-08-17 ENCOUNTER — Telehealth: Payer: Self-pay | Admitting: Hematology and Oncology

## 2020-08-17 LAB — CUP PACEART REMOTE DEVICE CHECK
Battery Remaining Longevity: 53 mo
Battery Remaining Percentage: 46 %
Battery Voltage: 2.96 V
Brady Statistic AP VP Percent: 39 %
Brady Statistic AP VS Percent: 1 %
Brady Statistic AS VP Percent: 61 %
Brady Statistic AS VS Percent: 1 %
Brady Statistic RA Percent Paced: 39 %
Brady Statistic RV Percent Paced: 99 %
Date Time Interrogation Session: 20220810020015
Implantable Lead Implant Date: 20170803
Implantable Lead Implant Date: 20170803
Implantable Lead Location: 753859
Implantable Lead Location: 753860
Implantable Pulse Generator Implant Date: 20170803
Lead Channel Impedance Value: 380 Ohm
Lead Channel Impedance Value: 400 Ohm
Lead Channel Pacing Threshold Amplitude: 0.5 V
Lead Channel Pacing Threshold Amplitude: 0.75 V
Lead Channel Pacing Threshold Pulse Width: 0.5 ms
Lead Channel Pacing Threshold Pulse Width: 0.5 ms
Lead Channel Sensing Intrinsic Amplitude: 1.2 mV
Lead Channel Sensing Intrinsic Amplitude: 2.2 mV
Lead Channel Setting Pacing Amplitude: 1 V
Lead Channel Setting Pacing Amplitude: 2 V
Lead Channel Setting Pacing Pulse Width: 0.5 ms
Lead Channel Setting Sensing Sensitivity: 2 mV
Pulse Gen Model: 2272
Pulse Gen Serial Number: 7929478

## 2020-08-17 NOTE — Telephone Encounter (Signed)
Scheduled appts per 8/10 sch msg. [name] is aware.

## 2020-08-18 ENCOUNTER — Other Ambulatory Visit: Payer: Self-pay | Admitting: Internal Medicine

## 2020-08-25 ENCOUNTER — Other Ambulatory Visit (INDEPENDENT_AMBULATORY_CARE_PROVIDER_SITE_OTHER): Payer: Medicare Other

## 2020-08-25 ENCOUNTER — Telehealth: Payer: Self-pay | Admitting: Internal Medicine

## 2020-08-25 ENCOUNTER — Other Ambulatory Visit: Payer: Self-pay

## 2020-08-25 ENCOUNTER — Inpatient Hospital Stay: Payer: Medicare Other

## 2020-08-25 ENCOUNTER — Ambulatory Visit (INDEPENDENT_AMBULATORY_CARE_PROVIDER_SITE_OTHER): Payer: Medicare Other | Admitting: Internal Medicine

## 2020-08-25 VITALS — BP 162/79 | HR 70 | Temp 98.6°F | Resp 18

## 2020-08-25 DIAGNOSIS — R195 Other fecal abnormalities: Secondary | ICD-10-CM

## 2020-08-25 DIAGNOSIS — D509 Iron deficiency anemia, unspecified: Secondary | ICD-10-CM | POA: Diagnosis not present

## 2020-08-25 DIAGNOSIS — D508 Other iron deficiency anemias: Secondary | ICD-10-CM

## 2020-08-25 DIAGNOSIS — Z86718 Personal history of other venous thrombosis and embolism: Secondary | ICD-10-CM | POA: Diagnosis not present

## 2020-08-25 DIAGNOSIS — I2510 Atherosclerotic heart disease of native coronary artery without angina pectoris: Secondary | ICD-10-CM | POA: Diagnosis not present

## 2020-08-25 DIAGNOSIS — R6889 Other general symptoms and signs: Secondary | ICD-10-CM

## 2020-08-25 DIAGNOSIS — D500 Iron deficiency anemia secondary to blood loss (chronic): Secondary | ICD-10-CM

## 2020-08-25 DIAGNOSIS — E538 Deficiency of other specified B group vitamins: Secondary | ICD-10-CM

## 2020-08-25 DIAGNOSIS — Z888 Allergy status to other drugs, medicaments and biological substances status: Secondary | ICD-10-CM | POA: Diagnosis not present

## 2020-08-25 DIAGNOSIS — Z79899 Other long term (current) drug therapy: Secondary | ICD-10-CM | POA: Diagnosis not present

## 2020-08-25 DIAGNOSIS — Z90710 Acquired absence of both cervix and uterus: Secondary | ICD-10-CM | POA: Diagnosis not present

## 2020-08-25 LAB — CBC WITH DIFFERENTIAL/PLATELET
Basophils Absolute: 0.1 10*3/uL (ref 0.0–0.1)
Basophils Relative: 0.5 % (ref 0.0–3.0)
Eosinophils Absolute: 0.2 10*3/uL (ref 0.0–0.7)
Eosinophils Relative: 1.6 % (ref 0.0–5.0)
HCT: 29.3 % — ABNORMAL LOW (ref 36.0–46.0)
Hemoglobin: 9.5 g/dL — ABNORMAL LOW (ref 12.0–15.0)
Lymphocytes Relative: 30.7 % (ref 12.0–46.0)
Lymphs Abs: 3.5 10*3/uL (ref 0.7–4.0)
MCHC: 32.4 g/dL (ref 30.0–36.0)
MCV: 78.7 fl (ref 78.0–100.0)
Monocytes Absolute: 0.5 10*3/uL (ref 0.1–1.0)
Monocytes Relative: 4.4 % (ref 3.0–12.0)
Neutro Abs: 7.2 10*3/uL (ref 1.4–7.7)
Neutrophils Relative %: 62.8 % (ref 43.0–77.0)
Platelets: 363 10*3/uL (ref 150.0–400.0)
RBC: 3.72 Mil/uL — ABNORMAL LOW (ref 3.87–5.11)
RDW: 17.4 % — ABNORMAL HIGH (ref 11.5–15.5)
WBC: 11.4 10*3/uL — ABNORMAL HIGH (ref 4.0–10.5)

## 2020-08-25 LAB — FERRITIN: Ferritin: 12.3 ng/mL (ref 10.0–291.0)

## 2020-08-25 MED ORDER — SODIUM CHLORIDE 0.9 % IV SOLN: Freq: Once | INTRAVENOUS | Status: AC

## 2020-08-25 MED ORDER — CYANOCOBALAMIN 1000 MCG/ML IJ SOLN
1000.0000 ug | Freq: Once | INTRAMUSCULAR | Status: AC
  Administered 2020-08-25: 1000 ug via INTRAMUSCULAR

## 2020-08-25 MED ORDER — SODIUM CHLORIDE 0.9 % IV SOLN
510.0000 mg | Freq: Once | INTRAVENOUS | Status: AC
  Administered 2020-08-25: 510 mg via INTRAVENOUS
  Filled 2020-08-25: qty 510

## 2020-08-25 NOTE — Telephone Encounter (Signed)
It appears that patient had labs drawn today in spite of Dr. Blanch Media note instructing labs to be redrawn 4 weeks from last labs

## 2020-08-25 NOTE — Telephone Encounter (Signed)
Inbound call from patient daughter. Patient came in for B12 shot and needed labs but she states she was told labs are not due till next week. But patient daughter states that is wrong because her next lab draw is on the 9/2 so that would mean no labs drawn in between. Would like a call to see if she is to have labs drawn this week

## 2020-08-25 NOTE — Patient Instructions (Signed)
Ferumoxytol injection What is this medication? FERUMOXYTOL is an iron complex. Iron is used to make healthy red blood cells, which carry oxygen and nutrients throughout the body. This medicine is used totreat iron deficiency anemia. This medicine may be used for other purposes; ask your health care provider orpharmacist if you have questions. COMMON BRAND NAME(S): Feraheme What should I tell my care team before I take this medication? They need to know if you have any of these conditions: anemia not caused by low iron levels high levels of iron in the blood magnetic resonance imaging (MRI) test scheduled an unusual or allergic reaction to iron, other medicines, foods, dyes, or preservatives pregnant or trying to get pregnant breast-feeding How should I use this medication? This medicine is for injection into a vein. It is given by a health careprofessional in a hospital or clinic setting. Talk to your pediatrician regarding the use of this medicine in children.Special care may be needed. Overdosage: If you think you have taken too much of this medicine contact apoison control center or emergency room at once. NOTE: This medicine is only for you. Do not share this medicine with others. What if I miss a dose? It is important not to miss your dose. Call your doctor or health careprofessional if you are unable to keep an appointment. What may interact with this medication? This medicine may interact with the following medications: other iron products This list may not describe all possible interactions. Give your health care provider a list of all the medicines, herbs, non-prescription drugs, or dietary supplements you use. Also tell them if you smoke, drink alcohol, or use illegaldrugs. Some items may interact with your medicine. What should I watch for while using this medication? Visit your doctor or healthcare professional regularly. Tell your doctor or healthcare professional if your  symptoms do not start to get better or if theyget worse. You may need blood work done while you are taking this medicine. You may need to follow a special diet. Talk to your doctor. Foods that contain iron include: whole grains/cereals, dried fruits, beans, or peas, leafy greenvegetables, and organ meats (liver, kidney). What side effects may I notice from receiving this medication? Side effects that you should report to your doctor or health care professionalas soon as possible: allergic reactions like skin rash, itching or hives, swelling of the face, lips, or tongue breathing problems changes in blood pressure feeling faint or lightheaded, falls fever or chills flushing, sweating, or hot feelings swelling of the ankles or feet Side effects that usually do not require medical attention (report to yourdoctor or health care professional if they continue or are bothersome): diarrhea headache nausea, vomiting stomach pain This list may not describe all possible side effects. Call your doctor for medical advice about side effects. You may report side effects to FDA at1-800-FDA-1088. Where should I keep my medication? This drug is given in a hospital or clinic and will not be stored at home. NOTE: This sheet is a summary. It may not cover all possible information. If you have questions about this medicine, talk to your doctor, pharmacist, orhealth care provider.  2022 Elsevier/Gold Standard (2016-02-12 20:21:10)  

## 2020-09-01 ENCOUNTER — Inpatient Hospital Stay: Payer: Medicare Other

## 2020-09-01 ENCOUNTER — Other Ambulatory Visit: Payer: Self-pay

## 2020-09-01 ENCOUNTER — Ambulatory Visit: Payer: Medicare Other

## 2020-09-01 VITALS — BP 157/73 | HR 66 | Temp 98.1°F | Resp 18

## 2020-09-01 DIAGNOSIS — Z90710 Acquired absence of both cervix and uterus: Secondary | ICD-10-CM | POA: Diagnosis not present

## 2020-09-01 DIAGNOSIS — I2510 Atherosclerotic heart disease of native coronary artery without angina pectoris: Secondary | ICD-10-CM | POA: Diagnosis not present

## 2020-09-01 DIAGNOSIS — Z888 Allergy status to other drugs, medicaments and biological substances status: Secondary | ICD-10-CM | POA: Diagnosis not present

## 2020-09-01 DIAGNOSIS — Z79899 Other long term (current) drug therapy: Secondary | ICD-10-CM | POA: Diagnosis not present

## 2020-09-01 DIAGNOSIS — D500 Iron deficiency anemia secondary to blood loss (chronic): Secondary | ICD-10-CM

## 2020-09-01 DIAGNOSIS — Z86718 Personal history of other venous thrombosis and embolism: Secondary | ICD-10-CM | POA: Diagnosis not present

## 2020-09-01 DIAGNOSIS — D509 Iron deficiency anemia, unspecified: Secondary | ICD-10-CM | POA: Diagnosis not present

## 2020-09-01 MED ORDER — SODIUM CHLORIDE 0.9 % IV SOLN: Freq: Once | INTRAVENOUS | Status: AC

## 2020-09-01 MED ORDER — SODIUM CHLORIDE 0.9 % IV SOLN
510.0000 mg | Freq: Once | INTRAVENOUS | Status: AC
  Administered 2020-09-01: 510 mg via INTRAVENOUS
  Filled 2020-09-01: qty 510

## 2020-09-01 NOTE — Patient Instructions (Signed)
Ferumoxytol injection What is this medication? FERUMOXYTOL is an iron complex. Iron is used to make healthy red blood cells, which carry oxygen and nutrients throughout the body. This medicine is used totreat iron deficiency anemia. This medicine may be used for other purposes; ask your health care provider orpharmacist if you have questions. COMMON BRAND NAME(S): Feraheme What should I tell my care team before I take this medication? They need to know if you have any of these conditions: anemia not caused by low iron levels high levels of iron in the blood magnetic resonance imaging (MRI) test scheduled an unusual or allergic reaction to iron, other medicines, foods, dyes, or preservatives pregnant or trying to get pregnant breast-feeding How should I use this medication? This medicine is for injection into a vein. It is given by a health careprofessional in a hospital or clinic setting. Talk to your pediatrician regarding the use of this medicine in children.Special care may be needed. Overdosage: If you think you have taken too much of this medicine contact apoison control center or emergency room at once. NOTE: This medicine is only for you. Do not share this medicine with others. What if I miss a dose? It is important not to miss your dose. Call your doctor or health careprofessional if you are unable to keep an appointment. What may interact with this medication? This medicine may interact with the following medications: other iron products This list may not describe all possible interactions. Give your health care provider a list of all the medicines, herbs, non-prescription drugs, or dietary supplements you use. Also tell them if you smoke, drink alcohol, or use illegaldrugs. Some items may interact with your medicine. What should I watch for while using this medication? Visit your doctor or healthcare professional regularly. Tell your doctor or healthcare professional if your  symptoms do not start to get better or if theyget worse. You may need blood work done while you are taking this medicine. You may need to follow a special diet. Talk to your doctor. Foods that contain iron include: whole grains/cereals, dried fruits, beans, or peas, leafy greenvegetables, and organ meats (liver, kidney). What side effects may I notice from receiving this medication? Side effects that you should report to your doctor or health care professionalas soon as possible: allergic reactions like skin rash, itching or hives, swelling of the face, lips, or tongue breathing problems changes in blood pressure feeling faint or lightheaded, falls fever or chills flushing, sweating, or hot feelings swelling of the ankles or feet Side effects that usually do not require medical attention (report to yourdoctor or health care professional if they continue or are bothersome): diarrhea headache nausea, vomiting stomach pain This list may not describe all possible side effects. Call your doctor for medical advice about side effects. You may report side effects to FDA at1-800-FDA-1088. Where should I keep my medication? This drug is given in a hospital or clinic and will not be stored at home. NOTE: This sheet is a summary. It may not cover all possible information. If you have questions about this medicine, talk to your doctor, pharmacist, orhealth care provider.  2022 Elsevier/Gold Standard (2016-02-12 20:21:10)  

## 2020-09-06 DIAGNOSIS — E1021 Type 1 diabetes mellitus with diabetic nephropathy: Secondary | ICD-10-CM | POA: Diagnosis not present

## 2020-09-07 NOTE — Progress Notes (Signed)
Remote pacemaker transmission.   

## 2020-09-08 ENCOUNTER — Telehealth: Payer: Self-pay | Admitting: *Deleted

## 2020-09-08 DIAGNOSIS — E538 Deficiency of other specified B group vitamins: Secondary | ICD-10-CM

## 2020-09-08 DIAGNOSIS — D508 Other iron deficiency anemias: Secondary | ICD-10-CM

## 2020-09-08 NOTE — Telephone Encounter (Signed)
To confirm- You no longer wish to draw labs on patient at her ECL but would rather her see hematology for any additional labs regarding B12/iron levels, correct? I just want to make sure that I am clear to the patient when I speak with her as she was planning to come prior to [facility] for the labs she was told in previous weeks that she would need to have. Thank you!

## 2020-09-08 NOTE — Telephone Encounter (Signed)
Patient came for b12 injection today. In looking at her chart, it appears her b12 level on 08/11/20 was greater than 7,500 pg/mL (lab ordered by Dr Lorenso Courier). After speaking with Ellouise Newer, PA-C, it was determined that we should hold off on additional b12 injections in the short term until we get Dr Blanch Media opinion regarding her levels.  Dr Blanch Media last lab note on 08/28/20 states that he would like to draw labs again in 2 weeks at patient's endoscopy/colonoscopy visit. Dr Henrene Pastor, which labs would you like drawn?

## 2020-09-08 NOTE — Telephone Encounter (Signed)
Check CBC and ferritin prior to the [facility] (that day). Send stat. Thanks.

## 2020-09-08 NOTE — Telephone Encounter (Signed)
Moving forward, her blood count monitoring and B-12 and iron replacement should be directed the the hematologist, Dr. Lorenso Courier. Thanks

## 2020-09-09 NOTE — Telephone Encounter (Signed)
I have spoken to patient's daughter, Karna Christmas (caregiver-ok to speak with per [facility]) to advise of Dr Blanch Media recommendation for patient to have future b12/iron issues taken over by Dr Lorenso Courier. She will have labs on Tuesday at her upcoming endoscopy/colonoscopy appointment.

## 2020-09-12 ENCOUNTER — Encounter: Payer: Self-pay | Admitting: Internal Medicine

## 2020-09-12 ENCOUNTER — Ambulatory Visit (AMBULATORY_SURGERY_CENTER): Payer: Medicare Other | Admitting: Internal Medicine

## 2020-09-12 ENCOUNTER — Other Ambulatory Visit: Payer: Self-pay

## 2020-09-12 ENCOUNTER — Other Ambulatory Visit (INDEPENDENT_AMBULATORY_CARE_PROVIDER_SITE_OTHER): Payer: Medicare Other

## 2020-09-12 VITALS — BP 120/72 | HR 72 | Temp 97.5°F | Resp 13 | Ht 61.0 in | Wt 126.0 lb

## 2020-09-12 DIAGNOSIS — C189 Malignant neoplasm of colon, unspecified: Secondary | ICD-10-CM

## 2020-09-12 DIAGNOSIS — D122 Benign neoplasm of ascending colon: Secondary | ICD-10-CM

## 2020-09-12 DIAGNOSIS — K6389 Other specified diseases of intestine: Secondary | ICD-10-CM | POA: Diagnosis not present

## 2020-09-12 DIAGNOSIS — C182 Malignant neoplasm of ascending colon: Secondary | ICD-10-CM | POA: Diagnosis not present

## 2020-09-12 DIAGNOSIS — D508 Other iron deficiency anemias: Secondary | ICD-10-CM

## 2020-09-12 DIAGNOSIS — R195 Other fecal abnormalities: Secondary | ICD-10-CM | POA: Diagnosis not present

## 2020-09-12 DIAGNOSIS — K648 Other hemorrhoids: Secondary | ICD-10-CM

## 2020-09-12 DIAGNOSIS — K5520 Angiodysplasia of colon without hemorrhage: Secondary | ICD-10-CM

## 2020-09-12 DIAGNOSIS — R978 Other abnormal tumor markers: Secondary | ICD-10-CM

## 2020-09-12 DIAGNOSIS — E538 Deficiency of other specified B group vitamins: Secondary | ICD-10-CM | POA: Diagnosis not present

## 2020-09-12 LAB — CBC WITH DIFFERENTIAL/PLATELET
Basophils Absolute: 0.1 10*3/uL (ref 0.0–0.1)
Basophils Relative: 1.1 % (ref 0.0–3.0)
Eosinophils Absolute: 0 10*3/uL (ref 0.0–0.7)
Eosinophils Relative: 0.3 % (ref 0.0–5.0)
HCT: 37.2 % (ref 36.0–46.0)
Hemoglobin: 12 g/dL (ref 12.0–15.0)
Lymphocytes Relative: 27.9 % (ref 12.0–46.0)
Lymphs Abs: 2.4 10*3/uL (ref 0.7–4.0)
MCHC: 32.3 g/dL (ref 30.0–36.0)
MCV: 84 fl (ref 78.0–100.0)
Monocytes Absolute: 0.5 10*3/uL (ref 0.1–1.0)
Monocytes Relative: 6.3 % (ref 3.0–12.0)
Neutro Abs: 5.6 10*3/uL (ref 1.4–7.7)
Neutrophils Relative %: 64.4 % (ref 43.0–77.0)
Platelets: 434 10*3/uL — ABNORMAL HIGH (ref 150.0–400.0)
RBC: 4.42 Mil/uL (ref 3.87–5.11)
RDW: 19.8 % — ABNORMAL HIGH (ref 11.5–15.5)
WBC: 8.7 10*3/uL (ref 4.0–10.5)

## 2020-09-12 LAB — COMPREHENSIVE METABOLIC PANEL
ALT: 26 U/L (ref 0–35)
AST: 76 U/L — ABNORMAL HIGH (ref 0–37)
Albumin: 4 g/dL (ref 3.5–5.2)
Alkaline Phosphatase: 52 U/L (ref 39–117)
BUN: 16 mg/dL (ref 6–23)
CO2: 23 mEq/L (ref 19–32)
Calcium: 10 mg/dL (ref 8.4–10.5)
Chloride: 110 mEq/L (ref 96–112)
Creatinine, Ser: 0.98 mg/dL (ref 0.40–1.20)
GFR: 52.39 mL/min — ABNORMAL LOW (ref >60.00)
Glucose, Bld: 107 mg/dL — ABNORMAL HIGH (ref 70–99)
Potassium: 3.7 mEq/L (ref 3.5–5.1)
Sodium: 145 mEq/L (ref 135–145)
Total Bilirubin: 0.4 mg/dL (ref 0.2–1.2)
Total Protein: 7.5 g/dL (ref 6.0–8.3)

## 2020-09-12 LAB — FERRITIN: Ferritin: 596.8 ng/mL — ABNORMAL HIGH (ref 10.0–291.0)

## 2020-09-12 MED ORDER — SODIUM CHLORIDE 0.9 % IV SOLN: 500.0000 mL | INTRAVENOUS | Status: DC

## 2020-09-12 NOTE — Patient Instructions (Signed)
Discharge instructions given. Handouts on polyps and Hemorrhoids. Contrast given to patient in recovery room. Office will schedule contrast enhanced CT scan of the chest,abdomin and pelvis. Lab work after discharge. Offer will refer the patient to general surgery. Contrast given to patient in recovery.  YOU HAD AN ENDOSCOPIC PROCEDURE TODAY AT [facility]:   Refer to the procedure report that was given to you for any specific questions about what was found during the examination.  If the procedure report does not answer your questions, please call your gastroenterologist to clarify.  If you requested that your care partner not be given the details of your procedure findings, then the procedure report has been included in a sealed envelope for you to review at your convenience later.  YOU SHOULD EXPECT: Some feelings of bloating in the abdomen. Passage of more gas than usual.  Walking can help get rid of the air that was put into your GI tract during the procedure and reduce the bloating. If you had a lower endoscopy (such as a colonoscopy or flexible sigmoidoscopy) you may notice spotting of blood in your stool or on the toilet paper. If you underwent a bowel prep for your procedure, you may not have a normal bowel movement for a few days.  Please Note:  You might notice some irritation and congestion in your nose or some drainage.  This is from the oxygen used during your procedure.  There is no need for concern and it should clear up in a day or so.  SYMPTOMS TO REPORT IMMEDIATELY:  Following lower endoscopy (colonoscopy or flexible sigmoidoscopy):  Excessive amounts of blood in the stool  Significant tenderness or worsening of abdominal pains  Swelling of the abdomen that is new, acute  Fever of 100F or higher   For urgent or emergent issues, a gastroenterologist can be reached at any hour by calling [phone]. Do not use MyChart messaging for urgent concerns.     DIET:  We do recommend a small meal at first, but then you may proceed to your regular diet.  Drink plenty of fluids but you should avoid alcoholic beverages for 24 hours.  ACTIVITY:  You should plan to take it easy for the rest of today and you should NOT DRIVE or use heavy machinery until tomorrow (because of the sedation medicines used during the test).    FOLLOW UP: Our staff will call the number listed on your records 48-72 hours following your procedure to check on you and address any questions or concerns that you may have regarding the information given to you following your procedure. If we do not reach you, we will leave a message.  We will attempt to reach you two times.  During this call, we will ask if you have developed any symptoms of COVID 19. If you develop any symptoms (ie: fever, flu-like symptoms, shortness of breath, cough etc.) before then, please call [phone].  If you test positive for Covid 19 in the 2 weeks post procedure, please call and report this information to [place].    If any biopsies were taken you will be contacted by phone or by letter within the next 1-3 weeks.  Please call us at [phone] if you have not heard about the biopsies in 3 weeks.    SIGNATURES/CONFIDENTIALITY: You and/or your care partner have signed paperwork which will be entered into your electronic medical record.  These signatures attest to the fact that that the information above  on your After Visit Summary has been reviewed and is understood.  Full responsibility of the confidentiality of this discharge information lies with you and/or your care-partner.

## 2020-09-12 NOTE — Progress Notes (Signed)
Called to room to assist during endoscopic procedure.  Patient ID and intended procedure confirmed with present staff. Received instructions for my participation in the procedure from the performing physician.  

## 2020-09-12 NOTE — Progress Notes (Signed)
VS by DT    

## 2020-09-12 NOTE — Progress Notes (Signed)
Sedate, gd SR, tolerated procedure well, VSS, report to RN 

## 2020-09-12 NOTE — Progress Notes (Signed)
HISTORY OF PRESENT ILLNESS:   Diana Wood is a 87 y.o. female with multiple medical problems who was evaluated in this office June 21, 2020 with progressive anemia suggestive of iron deficiency.  This was associated with severe fatigue.  See that dictation for details.  Repeat CBC showed persistent anemia (hemoglobin 9.1) with microcytic indices (MCV 76.6).  Sedimentation rate was moderately elevated at 67.  Ferritin and B12 levels were low normal (19.2 and 268 respectively).  She was placed on iron supplementation as well as B12 supplementation.  She submitted Hemoccult cards which returned positive x 6.  We were able to receive outside colonoscopy report from Dr. Earlean Shawl dated August 10, 2003.  The examination was normal including random colon biopsies.  At the time of her initial office evaluation, the patient had recently sustained a forehead laceration and had recovered from nonsevere COVID.  She presents today for follow-up as requested.  She did have follow-up CBC most recently July 14, 2020 with hemoglobin 9.7.  MCV 78.2.  She is again accompanied by her daughter.  Again, GI review of systems is quite unremarkable.  No overt GI bleeding.  No abdominal pain.  They do report ongoing weight loss.  7 pounds from last visit.  She does have somewhat decreased appetite.  Because of a history of GERD she does take omeprazole 20 mg daily.  Currently on a short course of amoxicillin due to gingival infection after oral surgery.   REVIEW OF SYSTEMS:   All non-GI ROS negative unless otherwise stated in the HPI except for fatigue, arthritis,       Past Medical History:  Diagnosis Date   Anemia     Arthritis     CAD (coronary artery disease)      a. s/p CABG 2008.   Chest pain     Deep vein thrombosis (HCC)     Diabetes mellitus     GERD (gastroesophageal reflux disease)     Hiatal hernia     Hypercholesterolemia     Hyperlipidemia     Hypertension     Hypothyroid     Obesity     Pleural effusion      Uterine cancer (HCC)             Past Surgical History:  Procedure Laterality Date   ABDOMINAL HYSTERECTOMY       bilateral feet surgery       bilateral knee surgeries       CATARACT EXTRACTION, BILATERAL       CORONARY ARTERY BYPASS GRAFT        x 4-11'08-Dr. Bartle([facility])   endoscopic vein harvesting from both thighs        Gilford Raid MD   EP IMPLANTABLE DEVICE N/A 08/10/2015    Procedure: Pacemaker Implant;  Surgeon: Will Meredith Leeds, MD;  Location: [facility];  Service: Cardiovascular;  Laterality: N/A;   excision cyst debridement fusion distal interphalangeal joint ring finger  with Mini Acutrak screw 20 mm in length       EYE SURGERY        corneal transplant -right   hysterectomy   2000    dx of uterine cancer   JOINT REPLACEMENT        right knee replacement-Dr. supple   surgery on 2 fingers        r HAND   TOTAL KNEE ARTHROPLASTY Left 03/16/2012    Procedure: TOTAL KNEE ARTHROPLASTY;  Surgeon: Gearlean Alf, MD;  Location: [facility];  Service: Orthopedics;  Laterality: Left;      Social History Diana Wood  reports that she has never smoked. She has never used smokeless tobacco. She reports that she does not drink alcohol and does not use drugs.   family history includes Blindness in her brother; Cancer in her mother; Diabetes in her mother; Hyperlipidemia in her daughter; Thyroid disease in her daughter.        Allergies  Allergen Reactions   Ace Inhibitors        Other reaction(s): Cough   Statins        Other reaction(s): Myalgias   Tramadol Hcl        Other reaction(s): 01/08/17 AMS issue in [place] was related to Tramadol   Losartan Cough   Tape Rash      Band aids ok, paper tape ok,          PHYSICAL EXAMINATION: Vital signs: BP 120/70   Pulse 62   Ht '5\' 1"'$  (1.549 m)   Wt 126 lb 12.8 oz (57.5 kg)   BMI 23.96 kg/m   Constitutional: Pleasant, thin, generally well-appearing elderly female, no acute distress Psychiatric: alert and  oriented x3, cooperative Head: Well-healed scar from previous area of trauma Eyes: extraocular movements intact, anicteric, conjunctiva pink Mouth: oral pharynx moist, no lesions Neck: supple no lymphadenopathy Cardiovascular: heart regular rate and rhythm, no murmur Lungs: clear to auscultation bilaterally Abdomen: soft, nontender, nondistended, no obvious ascites, no peritoneal signs, normal bowel sounds, no organomegaly Rectal: Deferred until colonoscopy Extremities: no clubbing, cyanosis, or lower extremity edema bilaterally Skin: no lesions on visible extremities Neuro: No focal deficits.  Cranial nerves intact   ASSESSMENT:   1.  Microcytic anemia likely iron deficiency.  On iron replacement.  No significant improvement in hemoglobin. 2.  Borderline B12 deficiency.  On replacement 3.  Hemoccult positive stool.  Rule out underlying GI mucosal abnormality 4.  GERD.  Asymptomatic on PPI 5.  Elevated sedimentation rate (67) 6.  Fatigue 7.  Weight loss 8.  Multiple medical problems 9.  Colonoscopy 2005.  Normal   PLAN:   1.  Arrange IV iron infusion (Feraheme). 2.  Continue oral iron and B12 replacement (every 2 weeks in our office currently) for now 3.  Repeat CBC, ferritin, and B12 level in 4 weeks 4.  Schedule colonoscopy to evaluate Hemoccult-positive stool and iron deficiency anemia.  Also weight loss.  Patient is high risk given her age and comorbidities.The nature of the procedure, as well as the risks, benefits, and alternatives were carefully and thoroughly reviewed with the patient. Ample time for discussion and questions allowed. The patient understood, was satisfied, and agreed to proceed.  5.  Schedule upper endoscopy to evaluate iron deficiency anemia and Hemoccult-positive stool.  Also weight loss.  Patient is high risk as above.The nature of the procedure, as well as the risks, benefits, and alternatives were carefully and thoroughly reviewed with the patient. Ample  time for discussion and questions allowed. The patient understood, was satisfied, and agreed to proceed.  6.  Reflux precautions 7.  Continue PPI. 8.  Hold diabetic medications the day of the procedure to avoid unwanted hypoglycemia 9.  Consider capsule endoscopy if the above studies unrevealing.  We discussed this today. 10..  Ongoing general medical care with Dr. Virgina Jock  GI INTERVAL HISTORY and physical  Patient presents today for colonoscopy and upper endoscopy to evaluate iron deficiency anemia and Hemoccult-positive stool.  She was fully  evaluated in the office August 02, 2020 as outlined above.  No interval change in clinical history or physical examination.  She did have blood work earlier today (after iron infusion) which showed marked improvement in her blood counts.  Hemoglobin now 12.0 with MCV 84 and ferritin level 597.  Docia Chuck. Geri Seminole., M.D. [facility] Division of Gastroenterology

## 2020-09-12 NOTE — Op Note (Signed)
[facility] Patient Name: Diana Wood Procedure Date: 09/12/2020 1:23 PM MRN: [id number] Endoscopist: Docia Chuck. Henrene Pastor , MD Age: 87 Referring MD:  Date of Birth: 11/26/1932 Gender: Female Account #: [id number] Procedure:                Colonoscopy with cold snare polypectomy x 2; w/                            biopsies;w/ submucosal injection Indications:              Heme positive stool, Iron deficiency anemia, Weight                            loss. Colonoscopy elsewhere 2005 was negative for                            neoplasia. NOTE: After iron infusion, the patient                            has had improvement in her blood work.                            Specifically, today's hemoglobin is 12.0 with MCV                            84. Ferritin level now 597. Medicines:                Monitored Anesthesia Care Procedure:                Pre-Anesthesia Assessment:                           - Prior to the procedure, a History and Physical                            was performed, and patient medications and                            allergies were reviewed. The patient's tolerance of                            previous anesthesia was also reviewed. The risks                            and benefits of the procedure and the sedation                            options and risks were discussed with the patient.                            All questions were answered, and informed consent                            was obtained. Prior Anticoagulants: The patient has  taken no previous anticoagulant or antiplatelet                            agents. ASA Grade Assessment: II - A patient with                            mild systemic disease. After reviewing the risks                            and benefits, the patient was deemed in                            satisfactory condition to undergo the procedure.                           After obtaining informed  consent, the colonoscope                            was passed under direct vision. Throughout the                            procedure, the patient's blood pressure, pulse, and                            oxygen saturations were monitored continuously. The                            CF [id number] was introduced through the anus                            and advanced to the the cecum, identified by                            appendiceal orifice and ileocecal valve. The                            ileocecal valve, appendiceal orifice, and rectum                            were photographed. The quality of the bowel                            preparation was excellent. The colonoscopy was                            performed without difficulty. The patient tolerated                            the procedure well. The bowel preparation used was                            SUPREP via split dose instruction. Scope In: 1:57:59 PM Scope Out: 2:14:54 PM Scope Withdrawal Time: 0 hours 15 minutes 0 seconds  Total Procedure  Duration: 0 hours 16 minutes 55 seconds  Findings:                 A single medium-sized angiodysplastic lesion                            without bleeding was found in the proximal                            ascending colon.                           Two polyps were found in the ascending colon. The                            polyps were 2 to 3 mm in size. These polyps were                            removed with a cold snare. Resection and retrieval                            were complete.                           An ulcerated non-obstructing mass was found in the                            distal ascending colon. The mass was partially                            circumferential (involving one-third of the lumen                            circumference). In addition, its diameter measured                            thirty mm. No bleeding was present. This was                             biopsied with a cold forceps for histology. Area                            was tattooed with an injection of Spot (carbon                            black).                           Internal hemorrhoids were found during retroflexion.                           The exam was otherwise without abnormality on                            direct and retroflexion views. Complications:  No immediate complications. Estimated blood loss:                            None. Estimated Blood Loss:     Estimated blood loss: none. Impression:               1. Right-sided colon cancer. Biopsied. Marked. This                            explains iron deficiency anemia and Hemoccult                            positive stool                           2. Incidental small polyps, removed                           3. Incidental right-sided AVM                           4. Hemorrhoids                           5. Otherwise normal exam. Recommendation:           1. Follow-up pathology                           2. Schedule contrast-enhanced CT scan of the chest,                            abdomen, and pelvis "right-sided colon cancer, rule                            out static disease"                           3. CEA level today in addition to comprehensive                            metabolic panel                           4. Follow-up colonoscopy likely 1 year                           5. Please refer the patient to general surgery                            "right-sided colon cancer, evaluate for right                            hemicolectomy"                           6. Please refer to GI oncology clinical navigator  Note: Today's upper endoscopy was canceled given                            colonoscopy findings and lack of upper GI symptoms                            in a patient on chronic PPI. Docia Chuck. Henrene Pastor, MD 09/12/2020 2:33:05 PM This report has been  signed electronically.

## 2020-09-12 NOTE — Progress Notes (Signed)
Patient drank water at 11:15, notified Osvaldo Angst CRNA, ok to proceed with procedure at 1:15.

## 2020-09-13 ENCOUNTER — Other Ambulatory Visit: Payer: Self-pay

## 2020-09-13 ENCOUNTER — Telehealth: Payer: Self-pay | Admitting: Internal Medicine

## 2020-09-13 ENCOUNTER — Telehealth: Payer: Self-pay

## 2020-09-13 DIAGNOSIS — C189 Malignant neoplasm of colon, unspecified: Secondary | ICD-10-CM

## 2020-09-13 DIAGNOSIS — K6389 Other specified diseases of intestine: Secondary | ICD-10-CM

## 2020-09-13 LAB — CEA: CEA: 1.6 ng/mL

## 2020-09-13 NOTE — Telephone Encounter (Signed)
Patients daughter called back wanting to know if there was lasting effects of the sedation that we gave her mom. I explained that her mom was only given propofol and that it would have been out of her system in about an hour after receiving it. She explained that her mom was showing some signs of dementia like asking questions about her mother who had been deceased for some time and trying to find her. The daughter said that she had talked to her mom today as well as her dad and would continue to check on her. She understood what I was saying about the anesthesia and the effects. I did advise her to keep a close watch on her and if it did not clear up to follow up with her PCP. She agreed and  had no further questions.

## 2020-09-13 NOTE — Telephone Encounter (Signed)
Ct scan of chest, abdomen and pelvis order entered into Epic and scheduled for Tues 13th at 2:30 at [facility]. Pt daughter stated that they wanted to change the appointment.Pt daughter was given the scheduling number [phone] to reschedule at her convenience. Referral to GI oncology clinical navigator sent to [facility] RN; Referral to General Surgery sent to [facility]

## 2020-09-13 NOTE — Telephone Encounter (Signed)
Pls call pt's daughter Karna Christmas. She has some questions about the side effects of sedation. Her phone is [phone].

## 2020-09-13 NOTE — Telephone Encounter (Signed)
Thank you Megan. I contacted the patient's daughter.  The patient's confusion has cleared up.  She will bring this to the attention of her PCP, should it recur. I also reviewed the pathology.  Adenocarcinoma of the colon.  She already has her CT scans and oncology follow-up scheduled.

## 2020-09-14 ENCOUNTER — Telehealth: Payer: Self-pay

## 2020-09-14 NOTE — Telephone Encounter (Signed)
  Follow up Call-  Call back number 09/12/2020  Post procedure Call Back phone  # [phone]  Permission to leave phone message Yes  Some recent data might be hidden     Patient questions:  Do you have a fever, pain , or abdominal swelling? No. Pain Score  0 *  Have you tolerated food without any problems? Yes.    Have you been able to return to your normal activities? Yes.    Do you have any questions about your discharge instructions: Diet   No. Medications  No. Follow up visit  No.  Do you have questions or concerns about your Care? No.  Actions: * If pain score is 4 or above: No action needed, pain <4.  Have you developed a fever since your procedure? no  2.   Have you had an respiratory symptoms (SOB or cough) since your procedure? no  3.   Have you tested positive for COVID 19 since your procedure no  4.   Have you had any family members/close contacts diagnosed with the COVID 19 since your procedure?  no   If yes to any of these questions please route to Joylene John, RN and Joella Prince, RN

## 2020-09-18 ENCOUNTER — Telehealth: Payer: Self-pay | Admitting: Internal Medicine

## 2020-09-18 NOTE — Telephone Encounter (Signed)
[name] Karna Christmas needs to speak with you about referral to [facility]. She needs the phone number and reason why pt has been referred. Pt did not remember.

## 2020-09-18 NOTE — Telephone Encounter (Signed)
Pt  daughter stated that she got the phone number for [facility] off of google. Pt daughter was encouraged to keep all appointments  this week.

## 2020-09-19 ENCOUNTER — Ambulatory Visit (HOSPITAL_COMMUNITY): Payer: Medicare Other

## 2020-09-20 ENCOUNTER — Telehealth: Payer: Self-pay | Admitting: *Deleted

## 2020-09-20 DIAGNOSIS — C182 Malignant neoplasm of ascending colon: Secondary | ICD-10-CM | POA: Diagnosis not present

## 2020-09-20 DIAGNOSIS — Z20822 Contact with and (suspected) exposure to covid-19: Secondary | ICD-10-CM | POA: Diagnosis not present

## 2020-09-20 NOTE — Telephone Encounter (Signed)
   [facility] Pre-operative Risk Assessment    Patient Name: AIMAR SHREWSBURY  DOB: [birth year] MRN: [id number]  HEARTCARE STAFF:  - IMPORTANT!!!!!! Under Visit Info/Reason for Call, type in Other and utilize the format Clearance MM/DD/YY or Clearance TBD. Do not use dashes or single digits. - Please review there is not already an duplicate clearance open for this procedure. - If request is for dental extraction, please clarify the # of teeth to be extracted. - If the patient is currently at the dentist's office, call Pre-Op Callback Staff (MA/nurse) to input urgent request.  - If the patient is not currently in the dentist office, please route to the Pre-Op pool.  Request for surgical clearance:  What type of surgery is being performed? Colon cancer-Lap. Right Hemicolectomy  When is this surgery scheduled? TBD  What type of clearance is required (medical clearance vs. Pharmacy clearance to hold med vs. Both)? Medical  Are there any medications that need to be held prior to surgery and how long? None listed  Practice name and name of physician performing surgery? [facility]  What is the office phone number? [phone]   7.   What is the office fax number? [phone]  8.   Anesthesia type (None, local, MAC, general) ? General   Bayyinah Dukeman L 09/20/2020, 3:51 PM  _________________________________________________________________   (provider comments below)

## 2020-09-21 ENCOUNTER — Ambulatory Visit (HOSPITAL_COMMUNITY)
Admission: RE | Admit: 2020-09-21 | Discharge: 2020-09-21 | Disposition: A | Discharge status: Home / Self Care | Payer: Medicare Other | Source: Ambulatory Visit | Attending: Internal Medicine | Admitting: Internal Medicine

## 2020-09-21 ENCOUNTER — Other Ambulatory Visit: Payer: Self-pay

## 2020-09-21 DIAGNOSIS — C189 Malignant neoplasm of colon, unspecified: Secondary | ICD-10-CM | POA: Diagnosis not present

## 2020-09-21 DIAGNOSIS — I701 Atherosclerosis of renal artery: Secondary | ICD-10-CM | POA: Diagnosis not present

## 2020-09-21 DIAGNOSIS — K6389 Other specified diseases of intestine: Secondary | ICD-10-CM | POA: Diagnosis not present

## 2020-09-21 DIAGNOSIS — J984 Other disorders of lung: Secondary | ICD-10-CM | POA: Diagnosis not present

## 2020-09-21 DIAGNOSIS — I700 Atherosclerosis of aorta: Secondary | ICD-10-CM | POA: Diagnosis not present

## 2020-09-21 DIAGNOSIS — K449 Diaphragmatic hernia without obstruction or gangrene: Secondary | ICD-10-CM | POA: Diagnosis not present

## 2020-09-21 DIAGNOSIS — I2510 Atherosclerotic heart disease of native coronary artery without angina pectoris: Secondary | ICD-10-CM | POA: Diagnosis not present

## 2020-09-21 DIAGNOSIS — K3189 Other diseases of stomach and duodenum: Secondary | ICD-10-CM | POA: Diagnosis not present

## 2020-09-21 MED ORDER — IOHEXOL 350 MG/ML SOLN
75.0000 mL | Freq: Once | INTRAVENOUS | Status: AC | PRN
  Administered 2020-09-21: 75 mL via INTRAVENOUS

## 2020-09-21 NOTE — Telephone Encounter (Addendum)
   Name: Diana Wood  DOB: Nov 10, 1932  MRN: [id number]   Primary Cardiologist: None  Chart reviewed as part of pre-operative protocol coverage. Patient was contacted 09/21/2020 in reference to pre-operative risk assessment for pending surgery as outlined below.  Diana Wood was last seen on 04/11/2020 by Dr. Johnsie Cancel.  Since that day, Diana Wood has consulted with cardiology for potential use of a new treatment for mild to moderate COVID-19 viral infection 05/11/2020.  Patient contacted today as part of preoperative protocol and ROS obtained via the help of family given patient is hard of hearing.  She is without any recent chest pain or shortness of breath.  She is able to walk to the bedroom from the kitchen without exertional symptoms, go out shopping, and go out to eat.  Functional capacity 0-4 METS.  Calculated RCRI low risk or 0.4% risk of MACE.   Therefore, based on ACC/AHA guidelines, the patient would be at acceptable risk for the planned procedure without further cardiovascular testing.   The patient was advised that if she develops new symptoms prior to surgery to contact our office to arrange for a follow-up visit, and she verbalized understanding.  I will route this recommendation to the requesting party via Epic fax function and remove from pre-op pool. Please call with questions.  Arvil Chaco, PA-C 09/21/2020, 11:37 AM

## 2020-09-22 ENCOUNTER — Inpatient Hospital Stay (HOSPITAL_BASED_OUTPATIENT_CLINIC_OR_DEPARTMENT_OTHER): Payer: Medicare Other | Admitting: Hematology and Oncology

## 2020-09-22 ENCOUNTER — Inpatient Hospital Stay: Payer: Medicare Other | Attending: Hematology and Oncology

## 2020-09-22 ENCOUNTER — Other Ambulatory Visit: Payer: Self-pay | Admitting: Hematology and Oncology

## 2020-09-22 VITALS — BP 151/74 | HR 67 | Temp 98.0°F | Resp 16 | Ht 61.0 in | Wt 126.4 lb

## 2020-09-22 DIAGNOSIS — Z809 Family history of malignant neoplasm, unspecified: Secondary | ICD-10-CM | POA: Diagnosis not present

## 2020-09-22 DIAGNOSIS — D500 Iron deficiency anemia secondary to blood loss (chronic): Secondary | ICD-10-CM

## 2020-09-22 DIAGNOSIS — Z8349 Family history of other endocrine, nutritional and metabolic diseases: Secondary | ICD-10-CM | POA: Insufficient documentation

## 2020-09-22 DIAGNOSIS — I700 Atherosclerosis of aorta: Secondary | ICD-10-CM | POA: Diagnosis not present

## 2020-09-22 DIAGNOSIS — Z821 Family history of blindness and visual loss: Secondary | ICD-10-CM | POA: Insufficient documentation

## 2020-09-22 DIAGNOSIS — Z79899 Other long term (current) drug therapy: Secondary | ICD-10-CM | POA: Diagnosis not present

## 2020-09-22 DIAGNOSIS — Z833 Family history of diabetes mellitus: Secondary | ICD-10-CM | POA: Insufficient documentation

## 2020-09-22 DIAGNOSIS — C182 Malignant neoplasm of ascending colon: Secondary | ICD-10-CM | POA: Insufficient documentation

## 2020-09-22 DIAGNOSIS — K922 Gastrointestinal hemorrhage, unspecified: Secondary | ICD-10-CM | POA: Insufficient documentation

## 2020-09-22 LAB — RETIC PANEL
Immature Retic Fract: 9.2 % (ref 2.3–15.9)
RBC.: 4.1 MIL/uL (ref 3.87–5.11)
Retic Count, Absolute: 58.6 10*3/uL (ref 19.0–186.0)
Retic Ct Pct: 1.4 % (ref 0.4–3.1)
Reticulocyte Hemoglobin: 31.9 pg (ref >27.9)

## 2020-09-22 LAB — CMP (CANCER CENTER ONLY)
ALT: 16 U/L (ref 0–44)
AST: 37 U/L (ref 15–41)
Albumin: 3.7 g/dL (ref 3.5–5.0)
Alkaline Phosphatase: 47 U/L (ref 38–126)
Anion gap: 9 (ref 5–15)
BUN: 16 mg/dL (ref 8–23)
CO2: 28 mmol/L (ref 22–32)
Calcium: 9.8 mg/dL (ref 8.9–10.3)
Chloride: 105 mmol/L (ref 98–111)
Creatinine: 0.99 mg/dL (ref 0.44–1.00)
GFR, Estimated: 56 mL/min — ABNORMAL LOW (ref >60)
Glucose, Bld: 108 mg/dL — ABNORMAL HIGH (ref 70–99)
Potassium: 3.9 mmol/L (ref 3.5–5.1)
Sodium: 142 mmol/L (ref 135–145)
Total Bilirubin: 0.3 mg/dL (ref 0.3–1.2)
Total Protein: 6.9 g/dL (ref 6.5–8.1)

## 2020-09-22 LAB — CBC WITH DIFFERENTIAL (CANCER CENTER ONLY)
Abs Immature Granulocytes: 0.02 10*3/uL (ref 0.00–0.07)
Basophils Absolute: 0 10*3/uL (ref 0.0–0.1)
Basophils Relative: 0 %
Eosinophils Absolute: 0.1 10*3/uL (ref 0.0–0.5)
Eosinophils Relative: 2 %
HCT: 34.8 % — ABNORMAL LOW (ref 36.0–46.0)
Hemoglobin: 11.3 g/dL — ABNORMAL LOW (ref 12.0–15.0)
Immature Granulocytes: 0 %
Lymphocytes Relative: 41 %
Lymphs Abs: 2.9 10*3/uL (ref 0.7–4.0)
MCH: 28 pg (ref 26.0–34.0)
MCHC: 32.5 g/dL (ref 30.0–36.0)
MCV: 86.1 fL (ref 80.0–100.0)
Monocytes Absolute: 0.8 10*3/uL (ref 0.1–1.0)
Monocytes Relative: 11 %
Neutro Abs: 3.3 10*3/uL (ref 1.7–7.7)
Neutrophils Relative %: 46 %
Platelet Count: 323 10*3/uL (ref 150–400)
RBC: 4.04 MIL/uL (ref 3.87–5.11)
RDW: 18.3 % — ABNORMAL HIGH (ref 11.5–15.5)
WBC Count: 7.1 10*3/uL (ref 4.0–10.5)
nRBC: 0 % (ref 0.0–0.2)

## 2020-09-22 LAB — IRON AND TIBC
Iron: 67 ug/dL (ref 41–142)
Saturation Ratios: 18 % — ABNORMAL LOW (ref 21–57)
TIBC: 379 ug/dL (ref 236–444)
UIBC: 312 ug/dL (ref 120–384)

## 2020-09-22 LAB — FERRITIN: Ferritin: 308 ng/mL — ABNORMAL HIGH (ref 11–307)

## 2020-09-22 NOTE — Progress Notes (Signed)
[place] [phone]   Fax:([phone]  PROGRESS NOTE  Patient Care Team: [name], MD as PCP - General (Internal Medicine) Constance Haw, MD as PCP - Electrophysiology (Cardiology)  Hematological/Oncological History # Invasive Adenocarcinoma of the Colon 09/12/2020: mass noted in distal ascending colon. Pathology consistent with adenocarcinoma.  09/20/2020: office visit with Dr. Dema Severin of colorectal surgery. Plan to proceed with laparoscopic right hemicolectomy pending results of CT C/A/P. 09/21/2020: CT C/A/P performed shows no evidence of metastatic disease. Only noted to have neoplasm in transverse colon.   # Iron Deficiency Anemia 2/2 to GI Bleed 11/13/2006: WBC 8.2, Hgb 10.9, MCV 86.5, Plt 365 03/19/2012: WBC 11.6, Hgb 9.5, MCV 82.5, Plt 339 05/10/2020: WBC 10.3, Hgb 8.7, MCV 81.5, Plt 357 08/11/2020: establish care with Dr. Lorenso Courier. Hgb 10.2, Ferritin 18, TIBC 568  Interval History:  Diana Wood 87 y.o. female with medical history significant for newly diagnosed adenocarcinoma of the colon who presents for a follow up visit. The patient's last visit was on 08/11/2020 at which time she established care for iron deficiency anemia. In the interim since the last visit she underwent a colonscopy on 09/12/2020 which revealed an adenocarcinoma of the colon.  On exam today Diana Wood is accompanied by her daughter.  She reports that she feels well overall.  She denies any overt signs of bleeding or blood in her stools.  She notes that she has been off her iron pills since the time of her colonoscopy.  She notes she not having any issues with lightheadedness, dizziness, or low energy.  Overall she feels well and has no questions concerns or complaints.  A full 10 point ROS is listed below.  The bulk of our discussion focused on the finding of invasive adenocarcinoma in the colon.  We discussed the treatment moving forward including surgery plus or minus adjuvant  chemotherapy.  She voiced understanding of this plan moving forward.  MEDICAL HISTORY:  Past Medical History:  Diagnosis Date   Anemia    Arthritis    CAD (coronary artery disease)    a. s/p CABG 2008.   Chest pain    Deep vein thrombosis (HCC)    Diabetes mellitus    GERD (gastroesophageal reflux disease)    Hiatal hernia    Hypercholesterolemia    Hyperlipidemia    Hypertension    Hypothyroid    Obesity    Pleural effusion    Uterine cancer (HCC)     SURGICAL HISTORY: Past Surgical History:  Procedure Laterality Date   ABDOMINAL HYSTERECTOMY     bilateral feet surgery     bilateral knee surgeries     CATARACT EXTRACTION, BILATERAL     CORONARY ARTERY BYPASS GRAFT     x 4-11'08-Dr. Bartle([facility])   endoscopic vein harvesting from both thighs     Gilford Raid MD   EP IMPLANTABLE DEVICE N/A 08/10/2015   Procedure: Pacemaker Implant;  Surgeon: Will Meredith Leeds, MD;  Location: [facility];  Service: Cardiovascular;  Laterality: N/A;   excision cyst debridement fusion distal interphalangeal joint ring finger  with Mini Acutrak screw 20 mm in length     EYE SURGERY     corneal transplant -right   hysterectomy  2000   dx of uterine cancer   JOINT REPLACEMENT     right knee replacement-Dr. supple   surgery on 2 fingers     r HAND   TOTAL KNEE ARTHROPLASTY Left 03/16/2012   Procedure: TOTAL  KNEE ARTHROPLASTY;  Surgeon: Gearlean Alf, MD;  Location: [facility];  Service: Orthopedics;  Laterality: Left;    SOCIAL HISTORY: Social History   Socioeconomic History   Marital status: Married    Spouse name: Not on file   Number of children: 3   Years of education: Not on file   Highest education level: Not on file  Occupational History   Occupation: retired    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  Tobacco Use   Smoking status: Never   Smokeless tobacco: Never  Vaping Use   Vaping Use: Never used  Substance and Sexual Activity   Alcohol use: No   Drug use: No    Sexual activity: Yes  Other Topics Concern   Not on file  Social History Narrative   Not on file   Social Determinants of Health   Financial Resource Strain: Not on file  Food Insecurity: Not on file  Transportation Needs: Not on file  Physical Activity: Not on file  Stress: Not on file  Social Connections: Not on file  Intimate Partner Violence: Not on file    FAMILY HISTORY: Family History  Problem Relation Age of Onset   Cancer Mother        type unknown   Diabetes Mother    Blindness Brother        partial from menigitis   Hyperlipidemia Daughter    Thyroid disease Daughter    Colon cancer Neg Hx     ALLERGIES:  is allergic to ace inhibitors, statins, tramadol hcl, losartan, and tape.  MEDICATIONS:  Current Outpatient Medications  Medication Sig Dispense Refill   Accu-Chek Softclix Lancets lancets 1 each by Other route as needed.     amLODipine (NORVASC) 10 MG tablet Take 10 mg by mouth daily. At 1500     Cholecalciferol (VITAMIN D PO) Take 2,000 Units by mouth daily after breakfast.      cyanocobalamin (,VITAMIN B-12,) 1000 MCG/ML injection INJECT 1 ML (1,000 MCG TOTAL) INTO THE MUSCLE ONCE FOR 1 DOSE. 3 mL 1   fenofibrate (TRICOR) 145 MG tablet Take 145 mg by mouth daily after breakfast.     ferrous sulfate 325 (65 FE) MG tablet Take 1 tablet by mouth daily.     hydrALAZINE (APRESOLINE) 25 MG tablet Take 25 mg by mouth 3 (three) times daily. After breakfast, at 1500 and at bedtime     hydrochlorothiazide (HYDRODIURIL) 12.5 MG tablet Take 12.5 mg by mouth daily after breakfast.      levothyroxine (SYNTHROID) 125 MCG tablet Take 125 mcg by mouth daily.     metFORMIN (GLUCOPHAGE) 500 MG tablet Take 1 tablet by mouth daily.      nitroGLYCERIN (NITROSTAT) 0.4 MG SL tablet Place 1 tablet (0.4 mg total) under the tongue every 5 (five) minutes as needed for chest pain. 25 tablet 3   Omega-3 Fatty Acids (FISH OIL PO) Take 1 tablet by mouth daily.     omeprazole  (PRILOSEC) 20 MG capsule Take 20 mg by mouth daily after breakfast.     prednisoLONE acetate (PRED FORTE) 1 % ophthalmic suspension Place 1 drop into both eyes every morning.     sertraline (ZOLOFT) 100 MG tablet Take 1 tablet by mouth daily.     TRUE METRIX BLOOD GLUCOSE TEST test strip 1 each by Other route daily.     Current Facility-Administered Medications  Medication Dose Route Frequency Provider Last Rate Last Admin   0.9 %  sodium chloride  infusion  500 mL Intravenous Continuous Irene Shipper, MD        REVIEW OF SYSTEMS:   Constitutional: ( - ) fevers, ( - )  chills , ( - ) night sweats Eyes: ( - ) blurriness of vision, ( - ) double vision, ( - ) watery eyes Ears, nose, mouth, throat, and face: ( - ) mucositis, ( - ) sore throat Respiratory: ( - ) cough, ( - ) dyspnea, ( - ) wheezes Cardiovascular: ( - ) palpitation, ( - ) chest discomfort, ( - ) lower extremity swelling Gastrointestinal:  ( - ) nausea, ( - ) heartburn, ( - ) change in bowel habits Skin: ( - ) abnormal skin rashes Lymphatics: ( - ) new lymphadenopathy, ( - ) easy bruising Neurological: ( - ) numbness, ( - ) tingling, ( - ) new weaknesses Behavioral/Psych: ( - ) mood change, ( - ) new changes  All other systems were reviewed with the patient and are negative.  PHYSICAL EXAMINATION: ECOG PERFORMANCE STATUS: 1 - Symptomatic but completely ambulatory  Vitals:   09/22/20 1132  BP: (!) 151/74  Pulse: 67  Resp: 16  Temp: 98 F (36.7 C)  SpO2: 99%   Filed Weights   09/22/20 1132  Weight: 126 lb 6.4 oz (57.3 kg)    GENERAL: Well-appearing elderly Caucasian female, alert, no distress and comfortable SKIN: skin color, texture, turgor are normal, no rashes or significant lesions EYES: conjunctiva are pink and non-injected, sclera clear LUNGS: clear to auscultation and percussion with normal breathing effort HEART: regular rate & rhythm and no murmurs and no lower extremity edema Musculoskeletal: no cyanosis  of digits and no clubbing  PSYCH: alert & oriented x 3, fluent speech NEURO: no focal motor/sensory deficits  LABORATORY DATA:  I have reviewed the data as listed CBC Latest Ref Rng & Units 09/22/2020 09/12/2020 08/25/2020  WBC 4.0 - 10.5 K/uL 7.1 8.7 11.4(H)  Hemoglobin 12.0 - 15.0 g/dL 11.3(L) 12.0 9.5(L)  Hematocrit 36.0 - 46.0 % 34.8(L) 37.2 29.3(L)  Platelets 150 - 400 K/uL 323 434.0(H) 363.0    CMP Latest Ref Rng & Units 09/22/2020 09/12/2020 08/11/2020  Glucose 70 - 99 mg/dL 108(H) 107(H) 97  BUN 8 - 23 mg/dL '16 16 16  '$ Creatinine 0.44 - 1.00 mg/dL 0.99 0.98 1.01(H)  Sodium 135 - 145 mmol/L 142 145 141  Potassium 3.5 - 5.1 mmol/L 3.9 3.7 3.8  Chloride 98 - 111 mmol/L 105 110 106  CO2 22 - 32 mmol/L '28 23 25  '$ Calcium 8.9 - 10.3 mg/dL 9.8 10.0 10.1  Total Protein 6.5 - 8.1 g/dL 6.9 7.5 7.1  Total Bilirubin 0.3 - 1.2 mg/dL 0.3 0.4 0.4  Alkaline Phos 38 - 126 U/L 47 52 41  AST 15 - 41 U/L 37 76(H) 41  ALT 0 - 44 U/L '16 26 16    '$ No results found for: MPROTEIN No results found for: KPAFRELGTCHN, LAMBDASER, KAPLAMBRATIO   Pathology:    RADIOGRAPHIC STUDIES: I have personally reviewed the radiological images as listed and agreed with the findings in the report: No overt signs of metastatic disease outside the colon. CT CHEST ABDOMEN PELVIS W CONTRAST  Result Date: 09/22/2020 CLINICAL DATA:  RIGHT-sided colon cancer, rule out metastatic disease in a 87 year old female. EXAM: CT CHEST, ABDOMEN, AND PELVIS WITH CONTRAST TECHNIQUE: Multidetector CT imaging of the chest, abdomen and pelvis was performed following the standard protocol during bolus administration of intravenous contrast. CONTRAST:  31m OMNIPAQUE IOHEXOL 350  MG/ML SOLN COMPARISON:  Comparison made with July 11, 2007. FINDINGS: CT CHEST FINDINGS Cardiovascular: RIGHT-sided pacer device in place. Leads in the RIGHT heart. Heart size is enlarged, potentially increased from prior imaging. No pericardial effusion. Signs of median  sternotomy for CABG. Calcified and noncalcified atheromatous plaque in the thoracic aorta without aneurysmal dilation. Normal caliber of the central pulmonary vasculature. Mediastinum/Nodes: Thoracic inlet structures are unremarkable. No adenopathy in the chest. Lungs/Pleura: Lungs are clear aside from some scarring and airways are patent. Minimal scarring present at the lung bases. Musculoskeletal: See below for full musculoskeletal details. CT ABDOMEN PELVIS FINDINGS Hepatobiliary: Hepatic steatosis and lobular hepatic contours. No focal, suspicious hepatic lesion. The portal vein is patent. Pancreas: Normal, without mass, inflammation or ductal dilatation. Spleen: Unremarkable. Adrenals/Urinary Tract: Adrenal glands are normal. Mild cortical scarring bilaterally. Small cyst along the interpolar RIGHT kidney. No hydronephrosis. No perinephric stranding. Urinary bladder under distended limiting assessment. Stomach/Bowel: Small hiatal hernia. Stomach is under distended. No acute small bowel process. Non-obstructing moderately large lesion eccentric in the proximal to mid transverse colon (image 70/2) approximally 2.7 x 2.1 cm. Mild stranding about the transverse colon at this level. No adenopathy in the transverse mesocolon. Distortion of the colonic wall best seen on coronal images Vascular/Lymphatic: Aortic atherosclerosis. No sign of aneurysm. Smooth contour of the IVC. There is no gastrohepatic or hepatoduodenal ligament lymphadenopathy. No retroperitoneal or mesenteric lymphadenopathy. No pelvic sidewall adenopathy. Top-normal celiac lymph nodes of uncertain significance given the possibility of liver disease based on fissural widening and nodularity of hepatic contour. Dense calcification at the origin the LEFT renal artery. The vessel is patent grossly on venous phase. Post lymphadenectomy in the pelvis. Reproductive: Post hysterectomy without sign of adnexal mass. Other: No ascites. Musculoskeletal: No acute  musculoskeletal process. Spinal degenerative changes. No destructive bone finding. IMPRESSION: Colonic neoplasm in the transverse colon with mild pericolonic stranding and distortion of the colonic wall, no obstruction. No evidence of metastatic disease in the chest, abdomen or pelvis. Hepatic steatosis and lobular hepatic contours. Correlate with any clinical or laboratory evidence of liver disease. Top-normal size lymph nodes in the celiac may relate to underlying mild and or developing liver disease, attention on follow-up. Signs of coronary artery disease post CABG with cardiac enlargement as described. Aortic Atherosclerosis (ICD10-I70.0). Electronically Signed   By: Zetta Bills M.D.   On: 09/22/2020 15:18    ASSESSMENT & PLAN Diana Wood 87 y.o. female with medical history significant for newly diagnosed adenocarcinoma of the colon who presents for a follow up visit.   #Adenocarcinoma of the Colon, pending surgery -- CT chest abdomen pelvis performed on 09/21/2020 shows no evidence of metastatic disease.  Appears to be a single lesion in the transverse colon. --Patient has established care with surgery and is planning for resection of this tumor. --Once the surgery is complete and pathological staging is done we will be able to discuss options including adjuvant chemotherapy. --Placeholder appointment scheduled for 2 months from now.  # Iron Deficiency Anemia 2/2 to GI Bleeding -- Patient has been on p.o. iron therapy but had to stop due to her colonoscopy on 09/12/2020.  We requested that she restart the therapy today. --on 8/19 and 8/26 the patient received IV Feraheme 510 mg q. 7 days x 2 doses --Etiology of her iron deficiency is likely the adenocarcinoma of the colon.  Definitive management of this will be surgical resection which is currently scheduled. --RTC in 2 months as above  No orders  of the defined types were placed in this encounter.   All questions were answered. The  patient knows to call the clinic with any problems, questions or concerns.  A total of more than 40 minutes were spent on this encounter with face-to-face time and non-face-to-face time, including preparing to see the patient, ordering tests and/or medications, counseling the patient and coordination of care as outlined above.   Ledell Peoples, MD Department of Hematology/Oncology [place] [facility] Phone: [phone] Pager: [phone] Email: [email or web address]  09/22/2020 4:45 PM

## 2020-09-29 ENCOUNTER — Ambulatory Visit (HOSPITAL_COMMUNITY): Payer: Medicare Other

## 2020-10-03 ENCOUNTER — Other Ambulatory Visit (HOSPITAL_COMMUNITY): Payer: Self-pay

## 2020-10-03 NOTE — Progress Notes (Signed)
Sent message, via epic in basket, requesting orders in epic from surgeon.  

## 2020-10-06 DIAGNOSIS — E1021 Type 1 diabetes mellitus with diabetic nephropathy: Secondary | ICD-10-CM | POA: Diagnosis not present

## 2020-10-09 NOTE — Patient Instructions (Addendum)
DUE TO COVID-19 ONLY ONE VISITOR IS ALLOWED TO COME WITH YOU AND STAY IN THE WAITING ROOM ONLY DURING PRE OP AND PROCEDURE DAY OF SURGERY IF YOU ARE GOING HOME AFTER SURGERY. IF YOU ARE SPENDING THE NIGHT 2 PEOPLE MAY VISIT WITH YOU IN YOUR PRIVATE ROOM AFTER SURGERY UNTIL VISITING  HOURS ARE OVER AT 8:00 PM AND 1 VISITOR CANSPEND THE NIGHT.   YOU NEED TO HAVE A COVID 19 TEST ON__10/5___THIS TEST MUST BE DONE BEFORE SURGERY,  COVID TESTING SITE  IS LOCATED AT [place], [place]. REMAIN IN YOUR CAR THIS IS A DRIVE UP TEST. AFTER YOUR COVID TEST PLEASE WEAR A MASK OUT IN PUBLIC AND SOCIAL DISTANCE AND [place] YOUR HANDS FREQUENTLY, ALSO ASK ALL YOUR CLOSE CONTACT PERSONS TO WEAR A MASK AND SOCIAL DISTANCE AND [place] THEIR HANDS FREQUENTLY ALSO.               Diana Wood     Your procedure is scheduled on: 10/13/20   [facility]   Report to admitting at  8:45 AM     Call this number if you have problems the morning of surgery [phone]   Follow all instructions for diet and bowel prep from the Dr's office. CALL OFFICE FOR INSTRUCTIONS  Drink plenty of fluids on prep day to prevent dehydration   Remember: Do not eat food  :After Midnight the night before your surgery,     You may have clear liquids from midnight until 8:00 am  DRINK 2 PRESURGERY ENSURE DRINKS THE NIGHT BEFORE SURGERY AT 10:00 PM AND 1 PRESURGERY DRINK THE DAY OF THE PROCEDURE 3 HOURS PRIOR TO SCHEDULED SURGERY.  NO SOLIDS AFTER MIDNIGHT THE DAY PRIOR TO THE SURGERY. NOTHING BY MOUTH EXCEPT CLEAR LIQUIDS UNTIL THREE HOURS PRIOR TO SCHEDULED SURGERY. PLEASE FINISH PRESURGERY ENSURE DRINK PER SURGEON ORDER 3 HOURS PRIOR TO SCHEDULED SURGERY TIME WHICH NEEDS TO BE COMPLETED AT _8:00 AM________.   CLEAR LIQUID DIET   Foods Allowed                                                                     Foods Excluded                                                                                                          liquids that you cannot  Plain Jell-O any favor except red or purple                                           see through such as: Fruit ices (not with fruit pulp)  milk, soups, orange juice  Iced Popsicles                                    All solid food Carbonated beverages, regular and diet                                    Cranberry, grape and apple juices Sports drinks like Gatorade Lightly seasoned clear broth or consume(fat free) Sugar     BRUSH YOUR TEETH MORNING OF SURGERY AND RINSE YOUR MOUTH OUT, NO CHEWING GUM CANDY OR MINTS.     Take these medicines the morning of surgery with A SIP OF WATER: Zoloft,Hydralazine, amlodipine, Memantine, Levothyroxine, Omeprazole  DO NOT TAKE ANY DIABETIC MEDICATIONS DAY OF YOUR SURGERY                               You may not have any metal on your body including hair pins and              piercings  Do not wear jewelry, make-up, lotions, powders or perfumes, deodorant             Do not wear nail polish on your fingernails.  Do not shave  48 hours prior to surgery.                 Do not bring valuables to the hospital. Newborn.  Contacts, dentures or bridgework may not be worn into surgery.  Leave suitcase in the car. After surgery it may be brought to your room.                 [facility] - Preparing for Surgery Before surgery, you can play an important role.  Because skin is not sterile, your skin needs to be as free of germs as possible.  You can reduce the number of germs on your skin by washing with CHG (chlorahexidine gluconate) soap before surgery.  CHG is an antiseptic cleaner which kills germs and bonds with the skin to continue killing germs even after washing. Please DO NOT use if you have an allergy to CHG or antibacterial soaps.  If your skin becomes reddened/irritated stop using the CHG and inform  your nurse when you arrive at Short Stay. Do not shave (including legs and underarms) for at least 48 hours prior to the first CHG shower.    Please follow these instructions carefully:  1.  Shower with CHG Soap the night before surgery and the  morning of Surgery.  2.  If you choose to wash your hair, wash your hair first as usual with your  normal  shampoo.  3.  After you shampoo, rinse your hair and body thoroughly to remove the  shampoo.                            4.  Use CHG as you would any other liquid soap.  You can apply chg directly  to the skin and wash  Gently with a scrungie or clean washcloth.  5.  Apply the CHG Soap to your body ONLY FROM THE NECK DOWN.   Do not use on face/ open                           Wound or open sores. Avoid contact with eyes, ears mouth and genitals (private parts).                       Wash face,  Genitals (private parts) with your normal soap.             6.  Wash thoroughly, paying special attention to the area where your surgery  will be performed.  7.  Thoroughly rinse your body with warm water from the neck down.  8.  DO NOT shower/wash with your normal soap after using and rinsing off  the CHG Soap.                9.  Pat yourself dry with a clean towel.            10.  Wear clean pajamas.            11.  Place clean sheets on your bed the night of your first shower and do not  sleep with pets. Day of Surgery : Do not apply any lotions/deodorants the morning of surgery.  Please wear clean clothes to the hospital/surgery center.  FAILURE TO FOLLOW THESE INSTRUCTIONS MAY RESULT IN THE CANCELLATION OF YOUR SURGERY PATIENT SIGNATURE_________________________________  NURSE SIGNATURE__________________________________  ________________________________________________________________________   Diana Wood  An incentive spirometer is a tool that can help keep your lungs clear and active. This tool measures how well you  are filling your lungs with each breath. Taking long deep breaths may help reverse or decrease the chance of developing breathing (pulmonary) problems (especially infection) following: A long period of time when you are unable to move or be active. BEFORE THE PROCEDURE  If the spirometer includes an indicator to show your best effort, your nurse or respiratory therapist will set it to a desired goal. If possible, sit up straight or lean slightly forward. Try not to slouch. Hold the incentive spirometer in an upright position. INSTRUCTIONS FOR USE  Sit on the edge of your bed if possible, or sit up as far as you can in bed or on a chair. Hold the incentive spirometer in an upright position. Breathe out normally. Place the mouthpiece in your mouth and seal your lips tightly around it. Breathe in slowly and as deeply as possible, raising the piston or the ball toward the top of the column. Hold your breath for 3-5 seconds or for as long as possible. Allow the piston or ball to fall to the bottom of the column. Remove the mouthpiece from your mouth and breathe out normally. Rest for a few seconds and repeat Steps 1 through 7 at least 10 times every 1-2 hours when you are awake. Take your time and take a few normal breaths between deep breaths. The spirometer may include an indicator to show your best effort. Use the indicator as a goal to work toward during each repetition. After each set of 10 deep breaths, practice coughing to be sure your lungs are clear. If you have an incision (the cut made at the time of surgery), support your incision when coughing by placing a pillow or rolled up towels firmly  against it. Once you are able to get out of bed, walk around indoors and cough well. You may stop using the incentive spirometer when instructed by your caregiver.  RISKS AND COMPLICATIONS Take your time so you do not get dizzy or light-headed. If you are in pain, you may need to take or ask for pain  medication before doing incentive spirometry. It is harder to take a deep breath if you are having pain. AFTER USE Rest and breathe slowly and easily. It can be helpful to keep track of a log of your progress. Your caregiver can provide you with a simple table to help with this. If you are using the spirometer at home, follow these instructions: [place] IF:  You are having difficultly using the spirometer. You have trouble using the spirometer as often as instructed. Your pain medication is not giving enough relief while using the spirometer. You develop fever of 100.5 F (38.1 C) or higher. SEEK IMMEDIATE MEDICAL CARE IF:  You cough up bloody sputum that had not been present before. You develop fever of 102 F (38.9 C) or greater. You develop worsening pain at or near the incision site. MAKE SURE YOU:  Understand these instructions. Will watch your condition. Will get help right away if you are not doing well or get worse. Document Released: 05/06/2006 Document Revised: 03/18/2011 Document Reviewed: 07/07/2006 [facility] Patient Information 2014 [place], [place].   ________________________________________________________________________

## 2020-10-10 ENCOUNTER — Other Ambulatory Visit: Payer: Self-pay

## 2020-10-10 ENCOUNTER — Encounter (HOSPITAL_COMMUNITY): Payer: Self-pay

## 2020-10-10 ENCOUNTER — Encounter (HOSPITAL_COMMUNITY)
Admission: RE | Admit: 2020-10-10 | Discharge: 2020-10-10 | Disposition: A | Discharge status: Home / Self Care | Payer: Medicare Other | Source: Ambulatory Visit | Attending: Surgery | Admitting: Surgery

## 2020-10-10 ENCOUNTER — Ambulatory Visit: Payer: Self-pay | Admitting: Surgery

## 2020-10-10 DIAGNOSIS — Z90710 Acquired absence of both cervix and uterus: Secondary | ICD-10-CM | POA: Diagnosis not present

## 2020-10-10 DIAGNOSIS — E669 Obesity, unspecified: Secondary | ICD-10-CM | POA: Diagnosis not present

## 2020-10-10 DIAGNOSIS — Z7901 Long term (current) use of anticoagulants: Secondary | ICD-10-CM | POA: Insufficient documentation

## 2020-10-10 DIAGNOSIS — E039 Hypothyroidism, unspecified: Secondary | ICD-10-CM | POA: Diagnosis not present

## 2020-10-10 DIAGNOSIS — K219 Gastro-esophageal reflux disease without esophagitis: Secondary | ICD-10-CM | POA: Diagnosis not present

## 2020-10-10 DIAGNOSIS — M1990 Unspecified osteoarthritis, unspecified site: Secondary | ICD-10-CM | POA: Diagnosis present

## 2020-10-10 DIAGNOSIS — K660 Peritoneal adhesions (postprocedural) (postinfection): Secondary | ICD-10-CM | POA: Diagnosis not present

## 2020-10-10 DIAGNOSIS — Z79899 Other long term (current) drug therapy: Secondary | ICD-10-CM | POA: Insufficient documentation

## 2020-10-10 DIAGNOSIS — C189 Malignant neoplasm of colon, unspecified: Secondary | ICD-10-CM | POA: Insufficient documentation

## 2020-10-10 DIAGNOSIS — E119 Type 2 diabetes mellitus without complications: Secondary | ICD-10-CM | POA: Diagnosis not present

## 2020-10-10 DIAGNOSIS — E876 Hypokalemia: Secondary | ICD-10-CM | POA: Diagnosis not present

## 2020-10-10 DIAGNOSIS — I10 Essential (primary) hypertension: Secondary | ICD-10-CM | POA: Insufficient documentation

## 2020-10-10 DIAGNOSIS — K449 Diaphragmatic hernia without obstruction or gangrene: Secondary | ICD-10-CM | POA: Diagnosis not present

## 2020-10-10 DIAGNOSIS — Z951 Presence of aortocoronary bypass graft: Secondary | ICD-10-CM | POA: Diagnosis not present

## 2020-10-10 DIAGNOSIS — E118 Type 2 diabetes mellitus with unspecified complications: Secondary | ICD-10-CM | POA: Insufficient documentation

## 2020-10-10 DIAGNOSIS — Z8542 Personal history of malignant neoplasm of other parts of uterus: Secondary | ICD-10-CM | POA: Diagnosis not present

## 2020-10-10 DIAGNOSIS — C182 Malignant neoplasm of ascending colon: Secondary | ICD-10-CM | POA: Diagnosis not present

## 2020-10-10 DIAGNOSIS — Z7984 Long term (current) use of oral hypoglycemic drugs: Secondary | ICD-10-CM | POA: Insufficient documentation

## 2020-10-10 DIAGNOSIS — Z01812 Encounter for preprocedural laboratory examination: Secondary | ICD-10-CM | POA: Insufficient documentation

## 2020-10-10 DIAGNOSIS — Z96653 Presence of artificial knee joint, bilateral: Secondary | ICD-10-CM | POA: Diagnosis present

## 2020-10-10 DIAGNOSIS — Z86718 Personal history of other venous thrombosis and embolism: Secondary | ICD-10-CM | POA: Diagnosis not present

## 2020-10-10 DIAGNOSIS — I2510 Atherosclerotic heart disease of native coronary artery without angina pectoris: Secondary | ICD-10-CM | POA: Diagnosis not present

## 2020-10-10 DIAGNOSIS — Z885 Allergy status to narcotic agent status: Secondary | ICD-10-CM | POA: Diagnosis not present

## 2020-10-10 DIAGNOSIS — Z888 Allergy status to other drugs, medicaments and biological substances status: Secondary | ICD-10-CM | POA: Diagnosis not present

## 2020-10-10 DIAGNOSIS — Z91048 Other nonmedicinal substance allergy status: Secondary | ICD-10-CM | POA: Diagnosis not present

## 2020-10-10 DIAGNOSIS — E7800 Pure hypercholesterolemia, unspecified: Secondary | ICD-10-CM | POA: Diagnosis not present

## 2020-10-10 DIAGNOSIS — J90 Pleural effusion, not elsewhere classified: Secondary | ICD-10-CM | POA: Diagnosis not present

## 2020-10-10 DIAGNOSIS — K648 Other hemorrhoids: Secondary | ICD-10-CM | POA: Diagnosis not present

## 2020-10-10 DIAGNOSIS — Z950 Presence of cardiac pacemaker: Secondary | ICD-10-CM | POA: Diagnosis not present

## 2020-10-10 DIAGNOSIS — Z833 Family history of diabetes mellitus: Secondary | ICD-10-CM | POA: Diagnosis not present

## 2020-10-10 DIAGNOSIS — D509 Iron deficiency anemia, unspecified: Secondary | ICD-10-CM | POA: Diagnosis not present

## 2020-10-10 LAB — GLUCOSE, CAPILLARY: Glucose-Capillary: 121 mg/dL — ABNORMAL HIGH (ref 70–99)

## 2020-10-10 LAB — CBC WITH DIFFERENTIAL/PLATELET
Abs Immature Granulocytes: 0.01 10*3/uL (ref 0.00–0.07)
Basophils Absolute: 0 10*3/uL (ref 0.0–0.1)
Basophils Relative: 0 %
Eosinophils Absolute: 0.1 10*3/uL (ref 0.0–0.5)
Eosinophils Relative: 2 %
HCT: 38 % (ref 36.0–46.0)
Hemoglobin: 11.9 g/dL — ABNORMAL LOW (ref 12.0–15.0)
Immature Granulocytes: 0 %
Lymphocytes Relative: 43 %
Lymphs Abs: 3.5 10*3/uL (ref 0.7–4.0)
MCH: 27.7 pg (ref 26.0–34.0)
MCHC: 31.3 g/dL (ref 30.0–36.0)
MCV: 88.6 fL (ref 80.0–100.0)
Monocytes Absolute: 0.9 10*3/uL (ref 0.1–1.0)
Monocytes Relative: 11 %
Neutro Abs: 3.7 10*3/uL (ref 1.7–7.7)
Neutrophils Relative %: 44 %
Platelets: 266 10*3/uL (ref 150–400)
RBC: 4.29 MIL/uL (ref 3.87–5.11)
RDW: 17.3 % — ABNORMAL HIGH (ref 11.5–15.5)
WBC: 8.2 10*3/uL (ref 4.0–10.5)
nRBC: 0 % (ref 0.0–0.2)

## 2020-10-10 LAB — BASIC METABOLIC PANEL
Anion gap: 11 (ref 5–15)
BUN: 22 mg/dL (ref 8–23)
CO2: 26 mmol/L (ref 22–32)
Calcium: 10 mg/dL (ref 8.9–10.3)
Chloride: 105 mmol/L (ref 98–111)
Creatinine, Ser: 0.72 mg/dL (ref 0.44–1.00)
GFR, Estimated: >60 mL/min (ref >60)
Glucose, Bld: 102 mg/dL — ABNORMAL HIGH (ref 70–99)
Potassium: 3.5 mmol/L (ref 3.5–5.1)
Sodium: 142 mmol/L (ref 135–145)

## 2020-10-10 LAB — TYPE AND SCREEN
ABO/RH(D): A POS
Antibody Screen: NEGATIVE

## 2020-10-11 ENCOUNTER — Other Ambulatory Visit: Payer: Self-pay | Admitting: Surgery

## 2020-10-11 LAB — HEMOGLOBIN A1C
Hgb A1c MFr Bld: 5.4 % (ref 4.8–5.6)
Mean Plasma Glucose: 108.28 mg/dL

## 2020-10-12 LAB — SARS CORONAVIRUS 2 (TAT 6-24 HRS): SARS Coronavirus 2: NEGATIVE

## 2020-10-12 NOTE — Progress Notes (Signed)
Anesthesia Chart Review   Case: [id number] Date/Time: 10/13/20 1045   Procedure: LAPAROSCOPIC RIGHT HEMI COLECTOMY (Right)   Anesthesia type: General   Pre-op diagnosis: COLON CANCER   Location[facility] ROOM 02 / [facility]   Surgeons: Ileana Roup, MD       DISCUSSION:87 y.o. never smoker with h/o HTN, GERD, DM II, CAD (CABG 2008), PPM in place (normal PPM function by [facility] 02/16/20), DVT, colon cancer scheduled for above procedure 10/13/2020 with Dr. Nadeen Landau.   Per cardiology preoperative evaluation 09/21/2020, "Chart reviewed as part of pre-operative protocol coverage. Patient was contacted 09/21/2020 in reference to pre-operative risk assessment for pending surgery as outlined below.  Diana Wood was last seen on 04/11/2020 by Dr. Johnsie Cancel.  Since that day, MANAL KREUTZER has consulted with cardiology for potential use of a new treatment for mild to moderate COVID-19 viral infection 05/11/2020.   Patient contacted today as part of preoperative protocol and ROS obtained via the help of family given patient is hard of hearing.  She is without any recent chest pain or shortness of breath.  She is able to walk to the bedroom from the kitchen without exertional symptoms, go out shopping, and go out to eat.  Functional capacity 0-4 METS.  Calculated RCRI low risk or 0.4% risk of MACE.   Therefore, based on ACC/AHA guidelines, the patient would be at acceptable risk for the planned procedure without further cardiovascular testing."  Device orders requested, pending.   Anticipate pt can proceed with planned procedure barring acute status change.   VS: BP (!) 150/75   Pulse 63   Temp 36.7 C (Oral)   Resp 16   Ht 5\' 1"  (1.549 m)   Wt 56.9 kg   SpO2 100%   BMI 23.71 kg/m   PROVIDERS: [name], MD is PCP   Constance Haw, MD as PCP - Electrophysiology  Jenkins Rouge, MD is Cardiologist  LABS: Labs reviewed: Acceptable for surgery. (all labs ordered are listed, but only  abnormal results are displayed)  Labs Reviewed  CBC WITH DIFFERENTIAL/PLATELET - Abnormal; Notable for the following components:      Result Value   Hemoglobin 11.9 (*)    RDW 17.3 (*)    All other components within normal limits  GLUCOSE, CAPILLARY - Abnormal; Notable for the following components:   Glucose-Capillary 121 (*)    All other components within normal limits  BASIC METABOLIC PANEL - Abnormal; Notable for the following components:   Glucose, Bld 102 (*)    All other components within normal limits  HEMOGLOBIN A1C  TYPE AND SCREEN     IMAGES:   EKG: 05/11/2020 Rate 64 bpm  AV dual-paced rhythm   CV: Stress Test 02/22/2016 Nuclear stress EF: 66%. The left ventricular ejection fraction is hyperdynamic (>65%). There was no ST segment deviation noted during stress. Defect 1: There is a defect present in the apex location. This is a low risk study.   Low risk stress nuclear study with a small apical perfusion artifact (likely pacing-related), otherwise normal perfusion and normal left ventricular regional and global systolic function. Past Medical History:  Diagnosis Date   Anemia    iron infusions   Arthritis    CAD (coronary artery disease)    a. s/p CABG 2008.   Chest pain    Deep vein thrombosis (HCC)    Diabetes mellitus    GERD (gastroesophageal reflux disease)    Hiatal hernia    Hypercholesterolemia  Hyperlipidemia    Hypertension    Hypothyroid    Obesity    Pleural effusion    Uterine cancer (HCC)     Past Surgical History:  Procedure Laterality Date   ABDOMINAL HYSTERECTOMY     bilateral feet surgery     CATARACT EXTRACTION, BILATERAL     CORONARY ARTERY BYPASS GRAFT     x 4-11'08-Dr. Bartle([facility])   endoscopic vein harvesting from both thighs     Gilford Raid MD   EP IMPLANTABLE DEVICE N/A 08/10/2015   Procedure: Pacemaker Implant;  Surgeon: Will Meredith Leeds, MD;  Location: [place] [facility];  Service: Cardiovascular;  Laterality:  N/A;   excision cyst debridement fusion distal interphalangeal joint ring finger  with Mini Acutrak screw 20 mm in length     EYE SURGERY     corneal transplant -right   hysterectomy  01/07/1998   dx of uterine cancer   JOINT REPLACEMENT     right knee replacement-Dr. supple   surgery on 2 fingers     r HAND   TOTAL KNEE ARTHROPLASTY Left 03/16/2012   Procedure: TOTAL KNEE ARTHROPLASTY;  Surgeon: Gearlean Alf, MD;  Location: [facility];  Service: Orthopedics;  Laterality: Left;    MEDICATIONS:  Accu-Chek Softclix Lancets lancets   amLODipine (NORVASC) 10 MG tablet   Cholecalciferol (VITAMIN D) 50 MCG (2000 UT) CAPS   cyanocobalamin (,VITAMIN B-12,) 1000 MCG/ML injection   fenofibrate (TRICOR) 145 MG tablet   ferrous sulfate 325 (65 FE) MG tablet   hydrALAZINE (APRESOLINE) 25 MG tablet   hydrochlorothiazide (HYDRODIURIL) 12.5 MG tablet   levothyroxine (SYNTHROID) 125 MCG tablet   memantine (NAMENDA) 5 MG tablet   metFORMIN (GLUCOPHAGE) 500 MG tablet   nitroGLYCERIN (NITROSTAT) 0.4 MG SL tablet   Omega-3 Fatty Acids (FISH OIL PO)   omeprazole (PRILOSEC) 20 MG capsule   prednisoLONE acetate (PRED FORTE) 1 % ophthalmic suspension   sertraline (ZOLOFT) 100 MG tablet   TRUE METRIX BLOOD GLUCOSE TEST test strip    0.9 %  sodium chloride infusion     Zacheriah Stumpe Ward, PA-C [facility] [phone]

## 2020-10-12 NOTE — Anesthesia Preprocedure Evaluation (Addendum)
Anesthesia Evaluation  Patient identified by MRN, date of birth, ID band Patient awake    Reviewed: Allergy & Precautions  Airway Mallampati: II  TM Distance: >3 FB     Dental   Pulmonary neg pulmonary ROS,    breath sounds clear to auscultation       Cardiovascular hypertension, + CAD  + dysrhythmias  Rhythm:Regular Rate:Normal     Neuro/Psych  Neuromuscular disease    GI/Hepatic Neg liver ROS, hiatal hernia, GERD  ,  Endo/Other  diabetesHypothyroidism   Renal/GU      Musculoskeletal   Abdominal   Peds  Hematology   Anesthesia Other Findings   Reproductive/Obstetrics                            Anesthesia Physical Anesthesia Plan  ASA: 3  Anesthesia Plan: General   Post-op Pain Management:    Induction: Intravenous  PONV Risk Score and Plan: 3 and Ondansetron, Dexamethasone and Midazolam  Airway Management Planned: Oral ETT  Additional Equipment:   Intra-op Plan:   Post-operative Plan: Extubation in OR  Informed Consent: I have reviewed the patients History and Physical, chart, labs and discussed the procedure including the risks, benefits and alternatives for the proposed anesthesia with the patient or authorized representative who has indicated his/her understanding and acceptance.     Dental advisory given  Plan Discussed with: CRNA and Anesthesiologist  Anesthesia Plan Comments: (See PAT note 10/10/2020, Konrad Felix Ward, PA-C)       Anesthesia Quick Evaluation

## 2020-10-13 ENCOUNTER — Inpatient Hospital Stay (HOSPITAL_COMMUNITY)
Admission: RE | Admit: 2020-10-13 | Discharge: 2020-10-15 | DRG: 331 | Disposition: A | Discharge status: Home / Self Care | Payer: Medicare Other | Source: Ambulatory Visit | Attending: Surgery | Admitting: Surgery

## 2020-10-13 ENCOUNTER — Inpatient Hospital Stay (HOSPITAL_COMMUNITY): Payer: Medicare Other | Admitting: Anesthesiology

## 2020-10-13 ENCOUNTER — Other Ambulatory Visit: Payer: Self-pay

## 2020-10-13 ENCOUNTER — Inpatient Hospital Stay (HOSPITAL_COMMUNITY): Payer: Medicare Other | Admitting: Physician Assistant

## 2020-10-13 ENCOUNTER — Encounter (HOSPITAL_COMMUNITY)
Admission: RE | Disposition: A | Discharge status: Home / Self Care | Payer: Self-pay | Source: Ambulatory Visit | Attending: Surgery

## 2020-10-13 ENCOUNTER — Encounter (HOSPITAL_COMMUNITY): Payer: Self-pay | Admitting: Surgery

## 2020-10-13 DIAGNOSIS — C182 Malignant neoplasm of ascending colon: Principal | ICD-10-CM | POA: Diagnosis present

## 2020-10-13 DIAGNOSIS — K648 Other hemorrhoids: Secondary | ICD-10-CM | POA: Diagnosis present

## 2020-10-13 DIAGNOSIS — E039 Hypothyroidism, unspecified: Secondary | ICD-10-CM | POA: Diagnosis present

## 2020-10-13 DIAGNOSIS — Z90710 Acquired absence of both cervix and uterus: Secondary | ICD-10-CM | POA: Diagnosis not present

## 2020-10-13 DIAGNOSIS — D509 Iron deficiency anemia, unspecified: Secondary | ICD-10-CM | POA: Diagnosis present

## 2020-10-13 DIAGNOSIS — K660 Peritoneal adhesions (postprocedural) (postinfection): Secondary | ICD-10-CM | POA: Diagnosis present

## 2020-10-13 DIAGNOSIS — Z888 Allergy status to other drugs, medicaments and biological substances status: Secondary | ICD-10-CM | POA: Diagnosis not present

## 2020-10-13 DIAGNOSIS — E7800 Pure hypercholesterolemia, unspecified: Secondary | ICD-10-CM | POA: Diagnosis present

## 2020-10-13 DIAGNOSIS — I10 Essential (primary) hypertension: Secondary | ICD-10-CM | POA: Diagnosis present

## 2020-10-13 DIAGNOSIS — I2510 Atherosclerotic heart disease of native coronary artery without angina pectoris: Secondary | ICD-10-CM | POA: Diagnosis present

## 2020-10-13 DIAGNOSIS — Z86718 Personal history of other venous thrombosis and embolism: Secondary | ICD-10-CM

## 2020-10-13 DIAGNOSIS — E119 Type 2 diabetes mellitus without complications: Secondary | ICD-10-CM | POA: Diagnosis present

## 2020-10-13 DIAGNOSIS — Z8542 Personal history of malignant neoplasm of other parts of uterus: Secondary | ICD-10-CM | POA: Diagnosis not present

## 2020-10-13 DIAGNOSIS — K219 Gastro-esophageal reflux disease without esophagitis: Secondary | ICD-10-CM | POA: Diagnosis present

## 2020-10-13 DIAGNOSIS — C189 Malignant neoplasm of colon, unspecified: Secondary | ICD-10-CM | POA: Diagnosis not present

## 2020-10-13 DIAGNOSIS — E669 Obesity, unspecified: Secondary | ICD-10-CM | POA: Diagnosis present

## 2020-10-13 DIAGNOSIS — Z9049 Acquired absence of other specified parts of digestive tract: Secondary | ICD-10-CM | POA: Insufficient documentation

## 2020-10-13 DIAGNOSIS — Z7984 Long term (current) use of oral hypoglycemic drugs: Secondary | ICD-10-CM

## 2020-10-13 DIAGNOSIS — J90 Pleural effusion, not elsewhere classified: Secondary | ICD-10-CM | POA: Diagnosis not present

## 2020-10-13 DIAGNOSIS — Z833 Family history of diabetes mellitus: Secondary | ICD-10-CM | POA: Diagnosis not present

## 2020-10-13 DIAGNOSIS — Z951 Presence of aortocoronary bypass graft: Secondary | ICD-10-CM

## 2020-10-13 DIAGNOSIS — Z96653 Presence of artificial knee joint, bilateral: Secondary | ICD-10-CM | POA: Diagnosis present

## 2020-10-13 DIAGNOSIS — Z91048 Other nonmedicinal substance allergy status: Secondary | ICD-10-CM

## 2020-10-13 DIAGNOSIS — M1990 Unspecified osteoarthritis, unspecified site: Secondary | ICD-10-CM | POA: Diagnosis present

## 2020-10-13 DIAGNOSIS — K449 Diaphragmatic hernia without obstruction or gangrene: Secondary | ICD-10-CM | POA: Diagnosis not present

## 2020-10-13 DIAGNOSIS — E876 Hypokalemia: Secondary | ICD-10-CM | POA: Diagnosis present

## 2020-10-13 DIAGNOSIS — Z7901 Long term (current) use of anticoagulants: Secondary | ICD-10-CM | POA: Diagnosis not present

## 2020-10-13 DIAGNOSIS — Z885 Allergy status to narcotic agent status: Secondary | ICD-10-CM

## 2020-10-13 DIAGNOSIS — Z950 Presence of cardiac pacemaker: Secondary | ICD-10-CM | POA: Diagnosis not present

## 2020-10-13 DIAGNOSIS — Z79899 Other long term (current) drug therapy: Secondary | ICD-10-CM

## 2020-10-13 HISTORY — PX: LAPAROSCOPIC RIGHT HEMI COLECTOMY: SHX5926

## 2020-10-13 LAB — GLUCOSE, CAPILLARY
Glucose-Capillary: 223 mg/dL — ABNORMAL HIGH (ref 70–99)
Glucose-Capillary: 167 mg/dL — ABNORMAL HIGH (ref 70–99)
Glucose-Capillary: 142 mg/dL — ABNORMAL HIGH (ref 70–99)
Glucose-Capillary: 105 mg/dL — ABNORMAL HIGH (ref 70–99)

## 2020-10-13 SURGERY — LAPAROSCOPIC RIGHT HEMI COLECTOMY
Anesthesia: General | Site: Abdomen | Laterality: Right

## 2020-10-13 MED ORDER — ACETAMINOPHEN 500 MG PO TABS
1000.0000 mg | ORAL_TABLET | Freq: Four times a day (QID) | ORAL | Status: DC
  Administered 2020-10-13 – 2020-10-15 (×5): 1000 mg via ORAL
  Filled 2020-10-13 (×4): qty 2

## 2020-10-13 MED ORDER — SODIUM CHLORIDE 0.9 % IV SOLN
2.0000 g | INTRAVENOUS | Status: AC
  Administered 2020-10-13: 2 g via INTRAVENOUS
  Filled 2020-10-13: qty 2

## 2020-10-13 MED ORDER — PHENYLEPHRINE HCL-NACL 20-0.9 MG/250ML-% IV SOLN
INTRAVENOUS | Status: DC | PRN
  Administered 2020-10-13: 20 ug/min via INTRAVENOUS

## 2020-10-13 MED ORDER — BUPIVACAINE LIPOSOME 1.3 % IJ SUSP
INTRAMUSCULAR | Status: AC
  Filled 2020-10-13: qty 20

## 2020-10-13 MED ORDER — ENSURE PRE-SURGERY PO LIQD
592.0000 mL | Freq: Once | ORAL | Status: DC
  Filled 2020-10-13: qty 592

## 2020-10-13 MED ORDER — LACTATED RINGERS IV SOLN: INTRAVENOUS | Status: DC

## 2020-10-13 MED ORDER — CHLORHEXIDINE GLUCONATE CLOTH 2 % EX PADS: 6.0000 | MEDICATED_PAD | Freq: Once | CUTANEOUS | Status: DC

## 2020-10-13 MED ORDER — DIPHENHYDRAMINE HCL 12.5 MG/5ML PO ELIX: 12.5000 mg | ORAL_SOLUTION | Freq: Four times a day (QID) | ORAL | Status: DC | PRN

## 2020-10-13 MED ORDER — FENTANYL CITRATE (PF) 100 MCG/2ML IJ SOLN
INTRAMUSCULAR | Status: DC | PRN
  Administered 2020-10-13 (×2): 25 ug via INTRAVENOUS
  Administered 2020-10-13: 50 ug via INTRAVENOUS

## 2020-10-13 MED ORDER — ALVIMOPAN 12 MG PO CAPS
12.0000 mg | ORAL_CAPSULE | Freq: Two times a day (BID) | ORAL | Status: DC
  Filled 2020-10-13: qty 1

## 2020-10-13 MED ORDER — IBUPROFEN 400 MG PO TABS
600.0000 mg | ORAL_TABLET | Freq: Four times a day (QID) | ORAL | Status: DC | PRN
  Administered 2020-10-13: 600 mg via ORAL
  Filled 2020-10-13: qty 1

## 2020-10-13 MED ORDER — PHENYLEPHRINE HCL (PRESSORS) 10 MG/ML IV SOLN
INTRAVENOUS | Status: AC
  Filled 2020-10-13: qty 1

## 2020-10-13 MED ORDER — LIDOCAINE HCL (PF) 2 % IJ SOLN
INTRAMUSCULAR | Status: AC
  Filled 2020-10-13: qty 5

## 2020-10-13 MED ORDER — LEVOTHYROXINE SODIUM 125 MCG PO TABS
125.0000 ug | ORAL_TABLET | Freq: Every day | ORAL | Status: DC
  Administered 2020-10-14 – 2020-10-15 (×2): 125 ug via ORAL
  Filled 2020-10-13 (×2): qty 1

## 2020-10-13 MED ORDER — HYDROMORPHONE HCL 1 MG/ML IJ SOLN: 0.5000 mg | INTRAMUSCULAR | Status: DC | PRN

## 2020-10-13 MED ORDER — HYDRALAZINE HCL 20 MG/ML IJ SOLN
10.0000 mg | INTRAMUSCULAR | Status: DC | PRN
Start: 2020-10-13 — End: 2020-10-15

## 2020-10-13 MED ORDER — ENSURE PRE-SURGERY PO LIQD
296.0000 mL | Freq: Once | ORAL | Status: DC
  Filled 2020-10-13: qty 296

## 2020-10-13 MED ORDER — PHENYLEPHRINE 40 MCG/ML (10ML) SYRINGE FOR IV PUSH (FOR BLOOD PRESSURE SUPPORT)
PREFILLED_SYRINGE | INTRAVENOUS | Status: DC | PRN
  Administered 2020-10-13: 120 ug via INTRAVENOUS
  Administered 2020-10-13: 80 ug via INTRAVENOUS

## 2020-10-13 MED ORDER — ROCURONIUM BROMIDE 10 MG/ML (PF) SYRINGE
PREFILLED_SYRINGE | INTRAVENOUS | Status: DC | PRN
  Administered 2020-10-13: 10 mg via INTRAVENOUS
  Administered 2020-10-13: 60 mg via INTRAVENOUS

## 2020-10-13 MED ORDER — LIDOCAINE HCL (PF) 2 % IJ SOLN
INTRAMUSCULAR | Status: DC | PRN
  Administered 2020-10-13: 1.5 mg/kg/h via INTRADERMAL

## 2020-10-13 MED ORDER — HEPARIN SODIUM (PORCINE) 5000 UNIT/ML IJ SOLN
5000.0000 [IU] | Freq: Three times a day (TID) | INTRAMUSCULAR | Status: DC
  Administered 2020-10-13 – 2020-10-15 (×6): 5000 [IU] via SUBCUTANEOUS
  Filled 2020-10-13 (×6): qty 1

## 2020-10-13 MED ORDER — AMLODIPINE BESYLATE 10 MG PO TABS
10.0000 mg | ORAL_TABLET | Freq: Every day | ORAL | Status: DC
  Administered 2020-10-14 – 2020-10-15 (×2): 10 mg via ORAL
  Filled 2020-10-13 (×2): qty 1

## 2020-10-13 MED ORDER — FENTANYL CITRATE PF 50 MCG/ML IJ SOSY: 25.0000 ug | PREFILLED_SYRINGE | INTRAMUSCULAR | Status: DC | PRN

## 2020-10-13 MED ORDER — HEPARIN SODIUM (PORCINE) 5000 UNIT/ML IJ SOLN
5000.0000 [IU] | Freq: Once | INTRAMUSCULAR | Status: AC
  Administered 2020-10-13: 5000 [IU] via SUBCUTANEOUS
  Filled 2020-10-13: qty 1

## 2020-10-13 MED ORDER — BISACODYL 5 MG PO TBEC: 20.0000 mg | DELAYED_RELEASE_TABLET | Freq: Once | ORAL | Status: DC

## 2020-10-13 MED ORDER — BUPIVACAINE-EPINEPHRINE (PF) 0.25% -1:200000 IJ SOLN
INTRAMUSCULAR | Status: AC
  Filled 2020-10-13: qty 30

## 2020-10-13 MED ORDER — ONDANSETRON HCL 4 MG/2ML IJ SOLN
INTRAMUSCULAR | Status: AC
  Filled 2020-10-13: qty 2

## 2020-10-13 MED ORDER — VITAMIN D3 25 MCG (1000 UNIT) PO TABS
2000.0000 [IU] | ORAL_TABLET | Freq: Every day | ORAL | Status: DC
  Administered 2020-10-14 – 2020-10-15 (×2): 2000 [IU] via ORAL
  Filled 2020-10-13 (×2): qty 2

## 2020-10-13 MED ORDER — ALVIMOPAN 12 MG PO CAPS
12.0000 mg | ORAL_CAPSULE | ORAL | Status: AC
  Administered 2020-10-13: 12 mg via ORAL
  Filled 2020-10-13: qty 1

## 2020-10-13 MED ORDER — SIMETHICONE 80 MG PO CHEW: 40.0000 mg | CHEWABLE_TABLET | Freq: Four times a day (QID) | ORAL | Status: DC | PRN

## 2020-10-13 MED ORDER — PROPOFOL 10 MG/ML IV BOLUS
INTRAVENOUS | Status: DC | PRN
  Administered 2020-10-13: 100 mg via INTRAVENOUS

## 2020-10-13 MED ORDER — OXYCODONE HCL 5 MG PO TABS: 5.0000 mg | ORAL_TABLET | Freq: Four times a day (QID) | ORAL | Status: DC | PRN

## 2020-10-13 MED ORDER — ENSURE SURGERY PO LIQD
237.0000 mL | Freq: Two times a day (BID) | ORAL | Status: DC
  Administered 2020-10-14 – 2020-10-15 (×4): 237 mL via ORAL

## 2020-10-13 MED ORDER — FENTANYL CITRATE (PF) 100 MCG/2ML IJ SOLN
INTRAMUSCULAR | Status: AC
  Filled 2020-10-13: qty 2

## 2020-10-13 MED ORDER — ROCURONIUM BROMIDE 10 MG/ML (PF) SYRINGE
PREFILLED_SYRINGE | INTRAVENOUS | Status: AC
  Filled 2020-10-13: qty 10

## 2020-10-13 MED ORDER — BUPIVACAINE LIPOSOME 1.3 % IJ SUSP: 20.0000 mL | Freq: Once | INTRAMUSCULAR | Status: DC

## 2020-10-13 MED ORDER — ONDANSETRON HCL 4 MG PO TABS: 4.0000 mg | ORAL_TABLET | Freq: Four times a day (QID) | ORAL | Status: DC | PRN

## 2020-10-13 MED ORDER — INSULIN ASPART 100 UNIT/ML IJ SOLN: 0.0000 [IU] | Freq: Every day | INTRAMUSCULAR | Status: DC

## 2020-10-13 MED ORDER — PHENYLEPHRINE 40 MCG/ML (10ML) SYRINGE FOR IV PUSH (FOR BLOOD PRESSURE SUPPORT)
PREFILLED_SYRINGE | INTRAVENOUS | Status: AC
  Filled 2020-10-13: qty 10

## 2020-10-13 MED ORDER — LIDOCAINE HCL (CARDIAC) PF 100 MG/5ML IV SOSY
PREFILLED_SYRINGE | INTRAVENOUS | Status: DC | PRN
  Administered 2020-10-13: 60 mg via INTRAVENOUS

## 2020-10-13 MED ORDER — POLYETHYLENE GLYCOL 3350 17 GM/SCOOP PO POWD: 1.0000 | Freq: Once | ORAL | Status: DC

## 2020-10-13 MED ORDER — BUPIVACAINE LIPOSOME 1.3 % IJ SUSP
INTRAMUSCULAR | Status: DC | PRN
  Administered 2020-10-13: 20 mL

## 2020-10-13 MED ORDER — DEXAMETHASONE SODIUM PHOSPHATE 10 MG/ML IJ SOLN
INTRAMUSCULAR | Status: AC
  Filled 2020-10-13: qty 1

## 2020-10-13 MED ORDER — FERROUS SULFATE 325 (65 FE) MG PO TABS
325.0000 mg | ORAL_TABLET | Freq: Every day | ORAL | Status: DC
  Administered 2020-10-14: 325 mg via ORAL
  Filled 2020-10-13 (×2): qty 1

## 2020-10-13 MED ORDER — MEMANTINE HCL 10 MG PO TABS
5.0000 mg | ORAL_TABLET | Freq: Every day | ORAL | Status: DC
  Administered 2020-10-14 – 2020-10-15 (×2): 5 mg via ORAL
  Filled 2020-10-13 (×2): qty 1

## 2020-10-13 MED ORDER — ONDANSETRON HCL 4 MG/2ML IJ SOLN
INTRAMUSCULAR | Status: DC | PRN
  Administered 2020-10-13: 4 mg via INTRAVENOUS

## 2020-10-13 MED ORDER — SUGAMMADEX SODIUM 200 MG/2ML IV SOLN
INTRAVENOUS | Status: DC | PRN
  Administered 2020-10-13: 200 mg via INTRAVENOUS

## 2020-10-13 MED ORDER — DEXAMETHASONE SODIUM PHOSPHATE 4 MG/ML IJ SOLN
INTRAMUSCULAR | Status: DC | PRN
  Administered 2020-10-13: 4 mg via INTRAVENOUS

## 2020-10-13 MED ORDER — HYDRALAZINE HCL 50 MG PO TABS
50.0000 mg | ORAL_TABLET | Freq: Two times a day (BID) | ORAL | Status: DC
  Administered 2020-10-13 – 2020-10-15 (×4): 50 mg via ORAL
  Filled 2020-10-13 (×4): qty 1

## 2020-10-13 MED ORDER — ONDANSETRON HCL 4 MG/2ML IJ SOLN: 4.0000 mg | Freq: Four times a day (QID) | INTRAMUSCULAR | Status: DC | PRN

## 2020-10-13 MED ORDER — NITROGLYCERIN 0.4 MG SL SUBL: 0.4000 mg | SUBLINGUAL_TABLET | SUBLINGUAL | Status: DC | PRN

## 2020-10-13 MED ORDER — CHLORHEXIDINE GLUCONATE 0.12 % MT SOLN
15.0000 mL | Freq: Once | OROMUCOSAL | Status: AC
  Administered 2020-10-13: 15 mL via OROMUCOSAL

## 2020-10-13 MED ORDER — BUPIVACAINE-EPINEPHRINE 0.25% -1:200000 IJ SOLN
INTRAMUSCULAR | Status: DC | PRN
  Administered 2020-10-13: 30 mL

## 2020-10-13 MED ORDER — OXYCODONE HCL 5 MG PO TABS
5.0000 mg | ORAL_TABLET | Freq: Three times a day (TID) | ORAL | 0 refills | Status: AC | PRN
  Filled 2020-10-14: qty 20, 7d supply, fill #0

## 2020-10-13 MED ORDER — PREDNISOLONE ACETATE 1 % OP SUSP
1.0000 [drp] | Freq: Every morning | OPHTHALMIC | Status: DC
  Administered 2020-10-14 – 2020-10-15 (×2): 1 [drp] via OPHTHALMIC
  Filled 2020-10-13: qty 5

## 2020-10-13 MED ORDER — NEOMYCIN SULFATE 500 MG PO TABS: 1000.0000 mg | ORAL_TABLET | ORAL | Status: DC

## 2020-10-13 MED ORDER — ORAL CARE MOUTH RINSE: 15.0000 mL | Freq: Once | OROMUCOSAL | Status: AC

## 2020-10-13 MED ORDER — DIPHENHYDRAMINE HCL 50 MG/ML IJ SOLN: 12.5000 mg | Freq: Four times a day (QID) | INTRAMUSCULAR | Status: DC | PRN

## 2020-10-13 MED ORDER — ACETAMINOPHEN 500 MG PO TABS
1000.0000 mg | ORAL_TABLET | ORAL | Status: AC
  Administered 2020-10-13: 1000 mg via ORAL
  Filled 2020-10-13: qty 2

## 2020-10-13 MED ORDER — HYDROCHLOROTHIAZIDE 25 MG PO TABS
12.5000 mg | ORAL_TABLET | Freq: Every day | ORAL | Status: DC
  Administered 2020-10-14 – 2020-10-15 (×2): 12.5 mg via ORAL
  Filled 2020-10-13 (×3): qty 1

## 2020-10-13 MED ORDER — LACTATED RINGERS IR SOLN
Status: DC | PRN
  Administered 2020-10-13: 1000 mL

## 2020-10-13 MED ORDER — METRONIDAZOLE 500 MG PO TABS: 1000.0000 mg | ORAL_TABLET | ORAL | Status: DC

## 2020-10-13 MED ORDER — SERTRALINE HCL 100 MG PO TABS
100.0000 mg | ORAL_TABLET | Freq: Every day | ORAL | Status: DC
  Administered 2020-10-14 – 2020-10-15 (×2): 100 mg via ORAL
  Filled 2020-10-13 (×2): qty 1

## 2020-10-13 MED ORDER — PANTOPRAZOLE SODIUM 40 MG PO TBEC
40.0000 mg | DELAYED_RELEASE_TABLET | Freq: Every day | ORAL | Status: DC
  Administered 2020-10-14 – 2020-10-15 (×2): 40 mg via ORAL
  Filled 2020-10-13 (×2): qty 1

## 2020-10-13 MED ORDER — 0.9 % SODIUM CHLORIDE (POUR BTL) OPTIME
TOPICAL | Status: DC | PRN
  Administered 2020-10-13 (×2): 1000 mL

## 2020-10-13 MED ORDER — INSULIN ASPART 100 UNIT/ML IJ SOLN
0.0000 [IU] | Freq: Three times a day (TID) | INTRAMUSCULAR | Status: DC
  Administered 2020-10-13: 1 [IU] via SUBCUTANEOUS
  Administered 2020-10-14: 2 [IU] via SUBCUTANEOUS

## 2020-10-13 SURGICAL SUPPLY — 66 items
ADH SKN CLS APL DERMABOND .7 (GAUZE/BANDAGES/DRESSINGS) ×1
APL PRP STRL LF DISP 70% ISPRP (MISCELLANEOUS) ×1
APPLIER CLIP ROT 10 11.4 M/L (STAPLE)
APR CLP MED LRG 11.4X10 (STAPLE)
BAG COUNTER SPONGE SURGICOUNT (BAG) IMPLANT
BAG SPNG CNTER NS LX DISP (BAG)
BLADE CLIPPER SURG (BLADE) IMPLANT
CABLE HIGH FREQUENCY MONO STRZ (ELECTRODE) ×3 IMPLANT
CELLS DAT CNTRL 66122 CELL SVR (MISCELLANEOUS) IMPLANT
CHLORAPREP W/TINT 26 (MISCELLANEOUS) ×2 IMPLANT
CLIP APPLIE ROT 10 11.4 M/L (STAPLE) IMPLANT
DECANTER SPIKE VIAL GLASS SM (MISCELLANEOUS) ×2 IMPLANT
DERMABOND ADVANCED (GAUZE/BANDAGES/DRESSINGS) ×1
DERMABOND ADVANCED .7 DNX12 (GAUZE/BANDAGES/DRESSINGS) IMPLANT
DISSECTOR BLUNT TIP ENDO 5MM (MISCELLANEOUS) IMPLANT
DRSG OPSITE POSTOP 4X6 (GAUZE/BANDAGES/DRESSINGS) ×1 IMPLANT
ELECT REM PT RETURN 15FT ADLT (MISCELLANEOUS) ×2 IMPLANT
GAUZE SPONGE 4X4 12PLY STRL (GAUZE/BANDAGES/DRESSINGS) ×2 IMPLANT
GLOVE SURG ENC MOIS LTX SZ7.5 (GLOVE) ×4 IMPLANT
GLOVE SURG NEOPR MICRO LF SZ8 (GLOVE) ×4 IMPLANT
GOWN STRL REUS W/TWL XL LVL3 (GOWN DISPOSABLE) ×8 IMPLANT
KIT TURNOVER KIT A (KITS) ×2 IMPLANT
LIGASURE IMPACT 36 18CM CVD LR (INSTRUMENTS) IMPLANT
NS IRRIG 1000ML POUR BTL (IV SOLUTION) ×2 IMPLANT
PACK COLON (CUSTOM PROCEDURE TRAY) ×2 IMPLANT
PAD POSITIONING PINK XL (MISCELLANEOUS) ×2 IMPLANT
PENCIL SMOKE EVACUATOR (MISCELLANEOUS) IMPLANT
PROTECTOR NERVE ULNAR (MISCELLANEOUS) IMPLANT
RELOAD PROXIMATE 75MM BLUE (ENDOMECHANICALS) ×4 IMPLANT
RELOAD STAPLE 75 3.8 BLU REG (ENDOMECHANICALS) IMPLANT
RETRACTOR WND ALEXIS 18 MED (MISCELLANEOUS) IMPLANT
RTRCTR WOUND ALEXIS 18CM MED (MISCELLANEOUS)
SCISSORS LAP 5X35 DISP (ENDOMECHANICALS) ×2 IMPLANT
SEALER TISSUE G2 STRG ARTC 35C (ENDOMECHANICALS) ×1 IMPLANT
SET IRRIG TUBING LAPAROSCOPIC (IRRIGATION / IRRIGATOR) ×1 IMPLANT
SET TUBE SMOKE EVAC HIGH FLOW (TUBING) ×2 IMPLANT
SHEARS HARMONIC ACE PLUS 36CM (ENDOMECHANICALS) IMPLANT
SLEEVE ADV FIXATION 5X100MM (TROCAR) ×4 IMPLANT
SLEEVE ENDOPATH XCEL 5M (ENDOMECHANICALS) ×2 IMPLANT
STAPLER 90 3.5 STAND SLIM (STAPLE) ×2
STAPLER 90 3.5 STD SLIM (STAPLE) IMPLANT
STAPLER PROXIMATE 75MM BLUE (STAPLE) ×1 IMPLANT
STAPLER VISISTAT 35W (STAPLE) ×2 IMPLANT
SUT MNCRL AB 4-0 PS2 18 (SUTURE) ×2 IMPLANT
SUT PDS AB 1 CT1 27 (SUTURE) ×4 IMPLANT
SUT PROLENE 2 0 CT2 30 (SUTURE) IMPLANT
SUT PROLENE 2 0 KS (SUTURE) IMPLANT
SUT SILK 2 0 (SUTURE) ×2
SUT SILK 2 0 SH CR/8 (SUTURE) ×2 IMPLANT
SUT SILK 2-0 18XBRD TIE 12 (SUTURE) ×1 IMPLANT
SUT SILK 3 0 (SUTURE) ×2
SUT SILK 3 0 SH CR/8 (SUTURE) ×2 IMPLANT
SUT SILK 3-0 18XBRD TIE 12 (SUTURE) ×1 IMPLANT
SUT V-LOC BARB 180 2/0GR6 GS22 (SUTURE) ×4
SUTURE V-LC BRB 180 2/0GR6GS22 (SUTURE) IMPLANT
SYS LAPSCP GELPORT 120MM (MISCELLANEOUS) ×2
SYSTEM LAPSCP GELPORT 120MM (MISCELLANEOUS) IMPLANT
TAPE CLOTH 4X10 WHT NS (GAUZE/BANDAGES/DRESSINGS) IMPLANT
TOWEL OR 17X26 10 PK STRL BLUE (TOWEL DISPOSABLE) ×2 IMPLANT
TRAY FOLEY MTR SLVR 14FR STAT (SET/KITS/TRAYS/PACK) ×2 IMPLANT
TRAY FOLEY MTR SLVR 16FR STAT (SET/KITS/TRAYS/PACK) IMPLANT
TROCAR ADV FIXATION 5X100MM (TROCAR) ×2 IMPLANT
TROCAR BALLN 12MMX100 BLUNT (TROCAR) ×2 IMPLANT
TROCAR XCEL NON-BLD 11X100MML (ENDOMECHANICALS) IMPLANT
TUBING CONNECTING 10 (TUBING) ×4 IMPLANT
YANKAUER SUCT BULB TIP NO VENT (SUCTIONS) ×2 IMPLANT

## 2020-10-13 NOTE — Discharge Instructions (Signed)
POST OP INSTRUCTIONS AFTER COLON SURGERY  DIET: Be sure to include lots of fluids daily to stay hydrated - 64oz of water per day (8, 8 oz glasses).  Avoid fast food or heavy meals for the first couple of weeks as your are more likely to get nauseated. Avoid raw/uncooked fruits or vegetables for the first 4 weeks (its ok to have these if they are blended into smoothie form). If you have fruits/vegetables, make sure they are cooked until soft enough to mash on the roof of your mouth and chew your food well. Otherwise, diet as tolerated.  Take your usually prescribed home medications unless otherwise directed.  PAIN CONTROL: Pain is best controlled by a usual combination of three different methods TOGETHER: Ice/Heat Over the counter pain medication Prescription pain medication Most patients will experience some swelling and bruising around the surgical site.  Ice packs or heating pads (30-60 minutes up to 6 times a day) will help. Some people prefer to use ice alone, heat alone, alternating between ice & heat.  Experiment to what works for you.  Swelling and bruising can take several weeks to resolve.   It is helpful to take an over-the-counter pain medication regularly for the first few weeks: Ibuprofen (Motrin/Advil) - 200mg tabs - take 3 tabs (600mg) every 6 hours as needed for pain (unless you have been directed previously to avoid NSAIDs/ibuprofen) Acetaminophen (Tylenol) - you may take 650mg every 6 hours as needed. You can take this with motrin as they act differently on the body. If you are taking a narcotic pain medication that has acetaminophen in it, do not take over the counter tylenol at the same time. NOTE: You may take both of these medications together - most patients  find it most helpful when alternating between the two (i.e. Ibuprofen at 6am, tylenol at 9am, ibuprofen at 12pm ...) A  prescription for pain medication should be given to you upon discharge.  Take your pain medication as  prescribed if your pain is not adequatly controlled with the over-the-counter pain reliefs mentioned above.  Avoid getting constipated.  Between the surgery and the pain medications, it is common to experience some constipation.  Increasing fluid intake and taking a fiber supplement (such as Metamucil, Citrucel, FiberCon, MiraLax, etc) 1-2 times a day regularly will usually help prevent this problem from occurring.  A mild laxative (prune juice, Milk of Magnesia, MiraLax, etc) should be taken according to package directions if there are no bowel movements after 48 hours.    Dressing: Your incisions are covered in Dermabond which is like sterile superglue for the skin. This will come off on it's own in a couple weeks. It is waterproof and you may bathe normally starting the day after your surgery in a shower. Avoid baths/pools/lakes/oceans until your wounds have fully healed.  ACTIVITIES as tolerated:   Avoid heavy lifting (>10lbs or 1 gallon of milk) for the next 6 weeks. You may resume regular daily activities as tolerated--such as daily self-care, walking, climbing stairs--gradually increasing activities as tolerated.  If you can walk 30 minutes without difficulty, it is safe to try more intense activity such as jogging, treadmill, bicycling, low-impact aerobics.  DO NOT PUSH THROUGH PAIN.  Let pain be your guide: If it hurts to do something, don't do it. You may drive when you are no longer taking prescription pain medication, you can comfortably wear a seatbelt, and you can safely maneuver your car and apply brakes.  FOLLOW UP in our   office Please call [facility] at [phone] to set up an appointment to see your surgeon in the office for a follow-up appointment approximately 2 weeks after your surgery. Make sure that you call for this appointment the day you arrive home to insure a convenient appointment time.  9. If you have disability or family leave forms that need to be completed, you may have  them completed by your primary care physician's office; for return to work instructions, please ask our office staff and they will be happy to assist you in obtaining this documentation   When to call us [phone]: Poor pain control Reactions / problems with new medications (rash/itching, etc)  Fever over 101.5 F (38.5 C) Inability to urinate Nausea/vomiting Worsening swelling or bruising Continued bleeding from incision. Increased pain, redness, or drainage from the incision  The clinic staff is available to answer your questions during regular business hours (8:30am-5pm).  Please don't hesitate to call and ask to speak to one of our nurses for clinical concerns.   A surgeon from [facility] is always on call at the hospitals   If you have a medical emergency, go to the nearest emergency room or call 911.  [facility] [address], [address], [place], [place]  [address] MAIN: [phone] FAX: [phone] [email or web address]  

## 2020-10-13 NOTE — Anesthesia Postprocedure Evaluation (Signed)
Anesthesia Post Note  Patient: Diana Wood  Procedure(s) Performed: LAPAROSCOPIC RIGHT HEMI COLECTOMY (Right: Abdomen)     Patient location during evaluation: PACU Anesthesia Type: General Level of consciousness: awake and alert Pain management: pain level controlled Vital Signs Assessment: post-procedure vital signs reviewed and stable Respiratory status: spontaneous breathing, nonlabored ventilation, respiratory function stable and patient connected to nasal cannula oxygen Cardiovascular status: blood pressure returned to baseline and stable Postop Assessment: no apparent nausea or vomiting Anesthetic complications: no   No notable events documented.  Last Vitals:  Vitals:   10/13/20 1500 10/13/20 1520  BP: (!) 141/63 (!) 149/63  Pulse: (!) 51 (!) 59  Resp: 16 16  Temp: 36.8 C 36.6 C  SpO2: 96% 98%    Last Pain:  Vitals:   10/13/20 1520  TempSrc: Oral  PainSc: 0-No pain                 Effie Berkshire

## 2020-10-13 NOTE — Transfer of Care (Signed)
Immediate Anesthesia Transfer of Care Note  Patient: Diana Wood  Procedure(s) Performed: LAPAROSCOPIC RIGHT HEMI COLECTOMY (Right: Abdomen)  Patient Location: PACU  Anesthesia Type:General  Level of Consciousness: awake  Airway & Oxygen Therapy: Patient Spontanous Breathing and Patient connected to face mask oxygen  Post-op Assessment: Report given to RN and Post -op Vital signs reviewed and stable  Post vital signs: Reviewed and stable  Last Vitals:  Vitals Value Taken Time  BP 130/57 10/13/20 1336  Temp    Pulse 57 10/13/20 1337  Resp 16 10/13/20 1337  SpO2 100 % 10/13/20 1337  Vitals shown include unvalidated device data.  Last Pain:  Vitals:   10/13/20 0928  TempSrc:   PainSc: 0-No pain      Patients Stated Pain Goal: 3 (44/96/75 9163)  Complications: No notable events documented.

## 2020-10-13 NOTE — Op Note (Signed)
PATIENT: Diana Wood  87 y.o. female  Patient Care Team: [name], MD as PCP - General (Internal Medicine) Constance Haw, MD as PCP - Electrophysiology (Cardiology)  PREOP DIAGNOSIS: Colon cancer  POSTOP DIAGNOSIS: Mid transverse colon cancer  PROCEDURE: Laparoscopic right hemicolectomy Laparoscopic lysis of adhesions x 90 minutes  SURGEON: Sharon Mt. Dema Severin, MD  ASSISTANT: Michael Boston, MD  ANESTHESIA: General endotracheal  EBL: 50 mL Total I/O In: 700 [I.V.:600; IV Piggyback:100] Out: 120 [Urine:70; Blood:50]  DRAINS: None  SPECIMEN: Right colon (including appendix, terminal ileum)  COUNTS: Sponge, needle and instrument counts were reported correct x2  FINDINGS:  Omental adhesions to the midline from prior surgery. Adhesions of both small bowel including terminal ileum and cecum in the pelvis. Tattoo in transverse colon. Mass at this location consistent with endoscopic findings. No obvious metastatic disease on visceral parietal peritoneum or liver. Adhesiolysis was necessary to carry out the procedure.  NARRATIVE:  The patient was identified & brought into the operating room, placed supine on the operating table and SCDs were applied to the lower extremities. General endotracheal anesthesia was induced. The patient was positioned supine with arms tucked. Antibiotics were administered. A foley catheter was placed under sterile conditions. Hair in the region of planned surgery was clipped. The abdomen was prepped and draped in a sterile fashion. A timeout was performed confirming our patient and plan.   Beginning with the extraction port, a supraumbilical incision was made and carried down to the midline fascia. This was then incised with electrocautery. The peritoneum was identified and elevated between clamps and carefully opened sharply. A small Alexis wound protector with a cap and associated port was then placed. The abdomen was insufflated to 15 mmHg with  CO2. A laparoscope was placed and camera inspection revealed no evidence of injury. Bilateral TAP blocks were then performed under laparoscopic visualization using a mixture of 0.25% marcaine with epinepherine + Exparel. 3 additional ports were then placed under direct laparoscopic visualization - two in the left hemiabdomen and one in the right abdomen. The abdomen was surveyed.  There are omental adhesions all across the low midline.  Are carefully taken down using a combination of sharp dissection as well as the Enseal device.  Ultimately, able to free all the omental adhesions.  The omentum was then reflected into the upper abdomen after confirming it was hemostatic. The liver and peritoneum appeared normal. She was positioned in trendelenburg with left side down. There were no signs of metastatic disease. There is a tattoo evident in the transverse colon. This is just to the right of the middle colic pedicle.  There are adhesions of the small bowel and her cecum down in her deeper pelvis.  It appears that her cecum and appendix have been mobilized and/or instrumented with her hysterectomy.  Adhesions of the small bowel were carefully taken down sharply.  There were adhesions of the small intestine to the underlying sigmoid colon.  There were also adhesions to the small bowel mesentery and the sigmoid colon.  All these were taken down with scissors.  There were adhesions between the small bowel and the small bowel mesentery.  These were also lysed.  This was necessary in order to facilitate the procedure.  We then focused our attention on mobilizing the cecum which was again adherent in the pelvis.  This was freed from all surrounding structures using scissors.  The pelvis was then irrigated and inspected.  The sigmoid colon was without any apparent serosal  injury.  The small bowel was inspected.  There are no evident deserosalization's or injuries.  The mesentery of the colon particularly on the cecal side was  quite adherent and therefore, we opted to proceed with a lateral to medial approach. The appendix was retracted medially.  The cecum and ascending colon were then mobilized by incising the Gaylan Fauver line of Toldt up to the level of the hepatic flexure.  The associated mesocolon was also mobilized medially.  The duodenum was identified and protected free of injury.  The colonic mesentery was carefully dissected free of the anterior wall of the duodenum.  The duodenum was inspected and noted to be free of injury.  She was then repositioned in reverse Trendelenburg.  The colon was retracted inferiorly and the omentum retracted anteriorly.  The lesser sac was gained on the midportion of the transverse colon distal to the tattoo.  This plane was developed all the way to and around the hepatic flexure.  The hepatic flexure was fully mobilized.  At this point, the terminal ileum reached into the left upper quadrant without any difficulty.  The abdomen was desufflated and the terminal ileum and right colon delivered through the wound protector. The terminal ileum was then transected using a GIA blue load stapler.  A Babcock was placed onto the distal ileum to help maintain orientation.  The distal point of transection was identified on the transverse colon 5 cm distal to the mass.  The transverse mesocolon was inspected.  The mass sits just to the right of the middle colic vessels.  Therefore, we opted to take the right and a branch of the central portion of the middle colic vessel, leaving the left branch supplying the remaining transverse colon.  The colon was divided using a GIA 75 blue load stapler.  The ileocolic pedicle was reidentified.  The duodenum was a confirmed to be away from this location.  The pedicle was circumferentially dissected.  This was then sealed and divided using the Enseal device.  The pedicle was inspected noted to be hemostatic.  The intervening mesentery out to the point of planned distal  transection was then divided using the Enseal device.  The cut edge of the mesentery was then inspected and noted to be hemostatic.  The specimen is inspected and there is a palpable mass at the level of the tattoo consistent with endoscopic findings. The specimen was passed off.    The transverse colon was inspected.  There is a palpable pulse going out to the staple line.  This is pink in color and well perfused in appearance; there is arterial bleeding at the staple line. Hemostasis was achieved at using 3-0 silk U-stitches. The distal ileum was inspected.  There is a palpable pulse in the mesentery going out to the divided ileum.  This is also pink in appearance.  Attention was turned to creating the anastomosis. The terminal ileum and transverse colon were inspected for orientation to ensure no twisting nor bowel included in the mesenteric defect. An anastomosis was created between the terminal ileum and the transverse colon using a 75 mm GIA blue load stapler. The staple line was inspected and noted to be hemostatic.  The common enterotomy channel was closed using 2-0 V-loc Connell sutures. Omentum was brought over the anastomosis and the tails of the V-loc run through.  A 2-0 silk suture was placed securing the apex of the anastomosis. The anastomosis was palpated and noted to be widely patent. This was then placed  back into the abdomen.   The abdomen was then irrigated with sterile saline and hemostasis verified. The omentum was then brought down over the anastomosis. The wound protector cap was replaced and CO2 reinsufflated. The laparoscopic ports were removed under direct visualization and the sites noted to be hemostatic. The Alexis wound protector was removed, counts were reported correct, and we switched to clean instruments, gowns and drapes.  The fascia was then closed using two running #1 PDS sutures.  The skin of all incision sites was closed with 4-0 monocryl subcuticular suture. Dermabond  was placed on the port sites and a sterile dressing was placed over the abdominal incision.   All counts were reported correct. She was then awakened from anesthesia and sent to the post anesthesia care unit in stable condition.   DISPOSITION: PACU in satisfactory condition

## 2020-10-13 NOTE — H&P (Signed)
CC: Here today for surgery  HPI: Diana Wood is an 87 y.o. female HTN, HLD, GERD, CAD (Dr. Johnsie Cancel) whom is seen in the office today as a referral by Dr. Henrene Pastor for evaluation of distal ascending colon mass.  Colonoscopy 09/12/20  1. mass in distal ascending colon, biopsied, tattoo'd. Invasive adenocarcinoma on path 2. Medium-sized angiodysplastic lesion in the proximal ascending colon. 3. 2 polyps in the ascending colon that were removed. TAs 4. Internal hemorrhoids  She has a history of iron deficiency anemia and weakness/falls in the past that triggered this work-up. She was found to have heme positive stool. She is on iron supplementation.  CEA 1.5  CT CAP scheduled for tomorrow  PMH: HTN, HLD, GERD, CAD (Dr. Johnsie Cancel), indwelling pacemaker  PSH: Open hysterectomy low midline  FHx: Denies any known family history of colorectal, breast, endometrial or ovarian cancer  Social Hx: Denies use of tobacco/EtOH/illicit drug. She is retired, here today with her husband and daughter  Past Medical History:  Diagnosis Date   Anemia    iron infusions   Arthritis    CAD (coronary artery disease)    a. s/p CABG 2008.   Chest pain    Deep vein thrombosis (HCC)    Diabetes mellitus    GERD (gastroesophageal reflux disease)    Hiatal hernia    Hypercholesterolemia    Hyperlipidemia    Hypertension    Hypothyroid    Obesity    Pleural effusion    Uterine cancer (HCC)     Past Surgical History:  Procedure Laterality Date   ABDOMINAL HYSTERECTOMY     bilateral feet surgery     CATARACT EXTRACTION, BILATERAL     CORONARY ARTERY BYPASS GRAFT     x 4-11'08-Dr. Bartle([facility])   endoscopic vein harvesting from both thighs     Gilford Raid MD   EP IMPLANTABLE DEVICE N/A 08/10/2015   Procedure: Pacemaker Implant;  Surgeon: Will Meredith Leeds, MD;  Location: [facility];  Service: Cardiovascular;  Laterality: N/A;   excision cyst debridement fusion distal interphalangeal joint  ring finger  with Mini Acutrak screw 20 mm in length     EYE SURGERY     corneal transplant -right   hysterectomy  01/07/1998   dx of uterine cancer   JOINT REPLACEMENT     right knee replacement-Dr. supple   surgery on 2 fingers     r HAND   TOTAL KNEE ARTHROPLASTY Left 03/16/2012   Procedure: TOTAL KNEE ARTHROPLASTY;  Surgeon: Gearlean Alf, MD;  Location: [facility];  Service: Orthopedics;  Laterality: Left;    Family History  Problem Relation Age of Onset   Cancer Mother        type unknown   Diabetes Mother    Blindness Brother        partial from menigitis   Hyperlipidemia Daughter    Thyroid disease Daughter    Colon cancer Neg Hx     Social:  reports that she has never smoked. She has never used smokeless tobacco. She reports that she does not drink alcohol and does not use drugs.  Allergies:  Allergies  Allergen Reactions   Ace Inhibitors Cough   Statins     Other reaction(s): Myalgias   Tramadol Hcl     Other reaction(s): 01/08/17 AMS issue in [place] was related to Tramadol   Losartan Cough   Tape Rash    Band aids ok, paper tape ok,  Medications: I have reviewed the patient's current medications.  Results for orders placed or performed during the hospital encounter of 10/13/20 (from the past 48 hour(s))  Glucose, capillary     Status: Abnormal   Collection Time: 10/13/20  9:34 AM  Result Value Ref Range   Glucose-Capillary 223 (H) 70 - 99 mg/dL    Comment: Glucose reference range applies only to samples taken after fasting for at least 8 hours.    No results found.  ROS - all of the below systems have been reviewed with the patient and positives are indicated with bold text General: chills, fever or night sweats Eyes: blurry vision or double vision ENT: epistaxis or sore throat Allergy/Immunology: itchy/watery eyes or nasal congestion Hematologic/Lymphatic: bleeding problems, blood clots or swollen lymph nodes Endocrine: temperature intolerance or  unexpected weight changes Breast: new or changing breast lumps or nipple discharge Resp: cough, shortness of breath, or wheezing CV: chest pain or dyspnea on exertion GI: as per HPI GU: dysuria, trouble voiding, or hematuria MSK: joint pain or joint stiffness Neuro: TIA or stroke symptoms Derm: pruritus and skin lesion changes Psych: anxiety and depression  PE Blood pressure (!) 115/58, pulse 69, temperature 98.1 F (36.7 C), temperature source Oral, resp. rate 18, height 5\' 1"  (1.549 m), weight 56.9 kg, SpO2 98 %. Constitutional: NAD; conversant; wearing mask Eyes: Moist conjunctiva; no lid lag; anicteric Lungs: Normal respiratory effort CV: RRR GI: Abd soft, ; no palpable hepatosplenomegaly MSK: Normal range of motion of extremities Psychiatric: Appropriate affect; alert and oriented x3  Results for orders placed or performed during the hospital encounter of 10/13/20 (from the past 48 hour(s))  Glucose, capillary     Status: Abnormal   Collection Time: 10/13/20  9:34 AM  Result Value Ref Range   Glucose-Capillary 223 (H) 70 - 99 mg/dL    Comment: Glucose reference range applies only to samples taken after fasting for at least 8 hours.    No results found.   A/P: Diana Wood is an 87 y.o. female with hx of HTN, HLD, GERD, CAD (Dr. Johnsie Cancel) here for evaluation of newly diagnosed distal ascending colon cancer  CEA normal CT CAP shows no evidence of metastatic disease (09/21/20)  -Cardiac clearance obtained -The anatomy and physiology of the GI tract was reviewed with the patient. The pathophysiology of colon polyps and cancer was discussed as well with associated pictures.  -We have discussed various different treatment options going forward including surgery (the most definitive) provided CT scans do not show evidence of metastasis to address this -laparoscopic right hemicolectomy, possible adhesiolysis -The planned procedure, material risks (including, but not limited to,  pain, bleeding, infection, scarring, need for blood transfusion, damage to surrounding structures- blood vessels/nerves/viscus/organs, damage to ureter, urine leak, leak from anastomosis, need for additional procedures, scenarios where a stoma may be necessary and where it may be permanent, worsening of pre-existing medical conditions, hernia, recurrence, pneumonia, heart attack, stroke, death) benefits and alternatives to surgery were discussed at length. The patient's questions were answered to her satisfaction, she voiced understanding and elected to proceed with surgery. Additionally, we discussed typical postoperative expectations and the recovery process.  Nadeen Landau, MD [facility] Use [email or web address] to contact on call provider

## 2020-10-13 NOTE — Anesthesia Procedure Notes (Signed)
Procedure Name: Intubation Date/Time: 10/13/2020 11:04 AM Performed by: Lieutenant Diego, CRNA Pre-anesthesia Checklist: Patient identified, Emergency Drugs available, Suction available and Patient being monitored Patient Re-evaluated:Patient Re-evaluated prior to induction Oxygen Delivery Method: Circle system utilized Preoxygenation: Pre-oxygenation with 100% oxygen Induction Type: IV induction Ventilation: Mask ventilation without difficulty Laryngoscope Size: Miller and 2 Grade View: Grade I Tube type: Oral Tube size: 7.0 mm Number of attempts: 1 Airway Equipment and Method: Stylet Placement Confirmation: ETT inserted through vocal cords under direct vision, positive ETCO2 and breath sounds checked- equal and bilateral Secured at: 22 cm Tube secured with: Tape Dental Injury: Teeth and Oropharynx as per pre-operative assessment

## 2020-10-14 ENCOUNTER — Encounter (HOSPITAL_COMMUNITY): Payer: Self-pay | Admitting: Surgery

## 2020-10-14 ENCOUNTER — Encounter: Payer: Self-pay | Admitting: Hematology and Oncology

## 2020-10-14 ENCOUNTER — Other Ambulatory Visit (HOSPITAL_COMMUNITY): Payer: Self-pay

## 2020-10-14 LAB — BASIC METABOLIC PANEL
Anion gap: 9 (ref 5–15)
BUN: 14 mg/dL (ref 8–23)
CO2: 26 mmol/L (ref 22–32)
Calcium: 9.5 mg/dL (ref 8.9–10.3)
Chloride: 107 mmol/L (ref 98–111)
Creatinine, Ser: 1.07 mg/dL — ABNORMAL HIGH (ref 0.44–1.00)
GFR, Estimated: 51 mL/min — ABNORMAL LOW (ref >60)
Glucose, Bld: 112 mg/dL — ABNORMAL HIGH (ref 70–99)
Potassium: 3.3 mmol/L — ABNORMAL LOW (ref 3.5–5.1)
Sodium: 142 mmol/L (ref 135–145)

## 2020-10-14 LAB — CBC
HCT: 31.9 % — ABNORMAL LOW (ref 36.0–46.0)
Hemoglobin: 10.4 g/dL — ABNORMAL LOW (ref 12.0–15.0)
MCH: 28 pg (ref 26.0–34.0)
MCHC: 32.6 g/dL (ref 30.0–36.0)
MCV: 85.8 fL (ref 80.0–100.0)
Platelets: 281 10*3/uL (ref 150–400)
RBC: 3.72 MIL/uL — ABNORMAL LOW (ref 3.87–5.11)
RDW: 17.1 % — ABNORMAL HIGH (ref 11.5–15.5)
WBC: 11.7 10*3/uL — ABNORMAL HIGH (ref 4.0–10.5)
nRBC: 0 % (ref 0.0–0.2)

## 2020-10-14 LAB — GLUCOSE, CAPILLARY
Glucose-Capillary: 106 mg/dL — ABNORMAL HIGH (ref 70–99)
Glucose-Capillary: 153 mg/dL — ABNORMAL HIGH (ref 70–99)
Glucose-Capillary: 106 mg/dL — ABNORMAL HIGH (ref 70–99)
Glucose-Capillary: 122 mg/dL — ABNORMAL HIGH (ref 70–99)

## 2020-10-14 MED ORDER — POTASSIUM CHLORIDE 10 MEQ/100ML IV SOLN
10.0000 meq | INTRAVENOUS | Status: AC
  Administered 2020-10-14 (×4): 10 meq via INTRAVENOUS
  Filled 2020-10-14: qty 100

## 2020-10-14 MED ORDER — POTASSIUM CHLORIDE 20 MEQ PO PACK
40.0000 meq | PACK | Freq: Once | ORAL | Status: AC
  Administered 2020-10-14: 40 meq via ORAL
  Filled 2020-10-14: qty 2

## 2020-10-14 NOTE — Progress Notes (Signed)
1 Day Post-Op   Subjective/Chief Complaint: No acute events. Pain with movement. Tolerating liquids without nausea or bloating, having small liquid bowel movements   Objective: Vital signs in last 24 hours: Temp:  [97.6 F (36.4 C)-98.5 F (36.9 C)] 98.5 F (36.9 C) (10/08 0612) Pulse Rate:  [51-70] 67 (10/08 0612) Resp:  [12-20] 17 (10/08 0612) BP: (108-150)/(41-65) 111/43 (10/08 0612) SpO2:  [95 %-100 %] 96 % (10/08 0612) Weight:  [56.9 kg] 56.9 kg (10/08 0612)    Intake/Output from previous day: 10/07 0701 - 10/08 0700 In: 1986.2 [P.O.:240; I.V.:1646.2; IV Piggyback:100] Out: 3790 [Urine:1445; Blood:50] Intake/Output this shift: Total I/O In: 240 [P.O.:240] Out: -   Alert and calm Unlabored respirations Abdomen soft, appropriately tender. Incisions c/d/i  Lab Results:  Recent Labs    10/14/20 0515  WBC 11.7*  HGB 10.4*  HCT 31.9*  PLT 281   BMET Recent Labs    10/14/20 0515  NA 142  K 3.3*  CL 107  CO2 26  GLUCOSE 112*  BUN 14  CREATININE 1.07*  CALCIUM 9.5   PT/INR No results for input(s): LABPROT, INR in the last 72 hours. ABG No results for input(s): PHART, HCO3 in the last 72 hours.  Invalid input(s): PCO2, PO2  Studies/Results: No results found.  Anti-infectives: Anti-infectives (From admission, onward)    Start     Dose/Rate Route Frequency Ordered Stop   10/13/20 1400  neomycin (MYCIFRADIN) tablet 1,000 mg  Status:  Discontinued       See Hyperspace for full Linked Orders Report.   1,000 mg Oral 3 times per day 10/13/20 0911 10/13/20 0922   10/13/20 1400  metroNIDAZOLE (FLAGYL) tablet 1,000 mg  Status:  Discontinued       See Hyperspace for full Linked Orders Report.   1,000 mg Oral 3 times per day 10/13/20 0911 10/13/20 0922   10/13/20 0915  cefoTEtan (CEFOTAN) 2 g in sodium chloride 0.9 % 100 mL IVPB        2 g 200 mL/hr over 30 Minutes Intravenous On call to O.R. 10/13/20 0911 10/13/20 1137       Assessment/Plan: s/p  Procedure(s): LAPAROSCOPIC RIGHT HEMI COLECTOMY (Right)  Mobilize, dc foley, stop IVF Hypokalemia- replace PO and IV, repeat labs in AM Slight incr creat- UOP excellent, monitor for now FLD   HTN Hypothyroid DM     LOS: 1 day    Clovis Riley 10/14/2020

## 2020-10-14 NOTE — Evaluation (Addendum)
Physical Therapy Evaluation Patient Details Name: Diana Wood MRN: [id number] DOB: 1932-05-10 Today's Date: 10/14/2020  History of Present Illness  87 y.o. female s/p R hemicolonectomy and lysis of adhesions on 10/7. PMH of hypothyrodism, HTN, Uterine cancer, OA and type II DM.  Clinical Impression  On eval, pt required Min A for mobility. She walked a short distance on today in her room. Assisted pt into recliner to have lunch. Will plan to follow and progress activity as tolerated. PT recommendation is for HHPT at this time.        Recommendations for follow up therapy are one component of a multi-disciplinary discharge planning process, led by the attending physician.  Recommendations may be updated based on patient status, additional functional criteria and insurance authorization.  Follow Up Recommendations Home health PT;Supervision/Assistance - 24 hour    Equipment Recommendations  None recommended by PT    Recommendations for Other Services       Precautions / Restrictions Precautions Precautions: Fall Precaution Comments: abd sg Restrictions Weight Bearing Restrictions: No      Mobility  Bed Mobility Overal bed mobility: Needs Assistance Bed Mobility: Supine to Sit     Supine to sit: HOB elevated     General bed mobility comments: Min A for trunk. Incresed time. Pt relied on elevated bed and bedrail    Transfers Overall transfer level: Needs assistance Equipment used: Straight cane "hurry" cane Transfers: Sit to/from Stand           General transfer comment: Min A to rise, steady, control descent. Cues for safety, hand placement  Ambulation/Gait Ambulation/Gait assistance: Min assist Gait Distance (Feet): 15 Feet (x2) Assistive device: Straight cane "hurry" cane Gait Pattern/deviations: Step-through pattern;Decreased stride length;Trunk flexed     General Gait Details: Assist to stabilize throughout distance. Unsteady. Distance limited by pt  needing to use restroom before having her lunch-which had already arrived.  Stairs            Wheelchair Mobility    Modified Rankin (Stroke Patients Only)       Balance Overall balance assessment: Needs assistance         Standing balance support: Single extremity supported Standing balance-Leahy Scale: Poor                               Pertinent Vitals/Pain Pain Assessment: Faces Faces Pain Scale: Hurts little more Pain Location: abdomen Pain Descriptors / Indicators: Discomfort;Guarding;Grimacing Pain Intervention(s): Limited activity within patient's tolerance;Monitored during session;Repositioned    Home Living Family/patient expects to be discharged to:: Private residence Living Arrangements: Spouse/significant other Available Help at Discharge: Family Type of Home: House Home Access: Ramped entrance (+ steps)     Home Layout: Able to live on main level with bedroom/bathroom Home Equipment: Environmental consultant - 2 wheels ("hurry"cane)      Prior Function Level of Independence: Independent with assistive device(s)         Comments: ambulates with "hurry" cane. husband assists with household chores     Hand Dominance   Dominant Hand: Right    Extremity/Trunk Assessment   Upper Extremity Assessment Upper Extremity Assessment: Defer to OT evaluation    Lower Extremity Assessment Lower Extremity Assessment: Generalized weakness    Cervical / Trunk Assessment Cervical / Trunk Assessment: Normal  Communication   Communication: No difficulties  Cognition Arousal/Alertness: Awake/alert Behavior During Therapy: WFL for tasks assessed/performed Overall Cognitive Status: Within Functional Limits for tasks  assessed                                        General Comments      Exercises     Assessment/Plan    PT Assessment Patient needs continued PT services  PT Problem List Decreased strength;Decreased mobility;Decreased  activity tolerance;Decreased balance;Decreased knowledge of use of DME;Pain       PT Treatment Interventions DME instruction;Gait training;Therapeutic exercise;Balance training;Functional mobility training;Patient/family education    PT Goals (Current goals can be found in the Care Plan section)  Acute Rehab PT Goals Patient Stated Goal: To go home, to have less pain PT Goal Formulation: With patient Time For Goal Achievement: 10/28/20 Potential to Achieve Goals: Good    Frequency Min 3X/week   Barriers to discharge        Co-evaluation               AM-PAC PT "6 Clicks" Mobility  Outcome Measure Help needed turning from your back to your side while in a flat bed without using bedrails?: A Little Help needed moving from lying on your back to sitting on the side of a flat bed without using bedrails?: A Little Help needed moving to and from a bed to a chair (including a wheelchair)?: A Little Help needed standing up from a chair using your arms (e.g., wheelchair or bedside chair)?: A Little Help needed to walk in hospital room?: A Little Help needed climbing 3-5 steps with a railing? : A Little 6 Click Score: 18    End of Session   Activity Tolerance: Patient tolerated treatment well Patient left: in chair;with call bell/phone within reach   PT Visit Diagnosis: Pain;Unsteadiness on feet (R26.81);Muscle weakness (generalized) (M62.81)    Time: 0981-1914 PT Time Calculation (min) (ACUTE ONLY): 12 min   Charges:   PT Evaluation $PT Eval Moderate Complexity: 1 Mod             Doreatha Massed, PT Acute Rehabilitation  Office: [phone] Pager: [phone]

## 2020-10-14 NOTE — Evaluation (Signed)
Occupational Therapy Evaluation Patient Details Name: Diana Wood MRN: [id number] DOB: [birth year] Today's Date: 10/14/2020   History of Present Illness 87 y.o. female s/p R hemicolonectomy and lysis of adhesions on 10/7. PMH of hypothyrodism, HTN, Uterine cancer, OA and type II DM.   Clinical Impression   PTA, pt was living with husband, independent in ADLs with use of quad cane and IADLs with assist from husband. Upon evaluation, pt with increased pain post-surgery. Pt currently requring min guard for ADLs in standing, set up for ADLs in sitting, and min guard for functional mobility. Educated on compensatory strategies to reduce pain during LB dressing and bed mobility. Due to current level of function, recommending no OT follow up upon d/c home. All acute OT needs met and education completed.     Recommendations for follow up therapy are one component of a multi-disciplinary discharge planning process, led by the attending physician.  Recommendations may be updated based on patient status, additional functional criteria and insurance authorization.   Follow Up Recommendations  No OT follow up;Supervision - Intermittent    Equipment Recommendations  None recommended by OT    Recommendations for Other Services       Precautions / Restrictions Precautions Precaution Comments: Back (for comfort-abdominal pain) Restrictions Weight Bearing Restrictions: No      Mobility Bed Mobility               General bed mobility comments: Pt OOB in recliner upon arrival    Transfers Overall transfer level: Needs assistance Equipment used: Quad cane Transfers: Sit to/from Stand           General transfer comment: Needing min guard and use BUEs to push up during sit to stand, uses quad cane at baseline for functional mobility.    Balance Overall balance assessment: Needs assistance Sitting-balance support: Feet unsupported;No upper extremity supported Sitting balance-Leahy  Scale: Good     Standing balance support: Single extremity supported Standing balance-Leahy Scale: Poor Standing balance comment: No LOBs with quad cane, intial unteadiness likely due to post-surgery weakness.                           ADL either performed or assessed with clinical judgement   ADL Overall ADL's : Needs assistance/impaired                                       General ADL Comments: Pt min guard for ADLs in standing, set up for ADLs in sitting.     Vision Patient Visual Report: No change from baseline       Perception     Praxis      Pertinent Vitals/Pain Pain Assessment: Faces Faces Pain Scale: Hurts little more Pain Location: abdomen Pain Descriptors / Indicators: Discomfort;Guarding;Grimacing Pain Intervention(s): Limited activity within patient's tolerance;Monitored during session     Hand Dominance Right   Extremity/Trunk Assessment Upper Extremity Assessment Upper Extremity Assessment: Overall WFL for tasks assessed       Cervical / Trunk Assessment Cervical / Trunk Assessment: Normal   Communication Communication Communication: No difficulties   Cognition Arousal/Alertness: Awake/alert Behavior During Therapy: WFL for tasks assessed/performed Overall Cognitive Status: Within Functional Limits for tasks assessed  General Comments: Able to follow mutlistep commands.   General Comments       Exercises     Shoulder Instructions      Home Living Family/patient expects to be discharged to:: Private residence Living Arrangements: Spouse/significant other Available Help at Discharge: Family Type of Home: House Home Access: Ramped entrance (+2 steps)     Home Layout: Able to live on main level with bedroom/bathroom     Bathroom Shower/Tub: Walk-in shower;Tub/shower unit   Bathroom Toilet: Standard     Home Equipment: Environmental consultant - 2 wheels;Cane - quad           Prior Functioning/Environment Level of Independence: Independent with assistive device(s)        Comments: Walked with quad cane. Independent in ADLs, husband assisted with laundry due to basement being downstairs.        OT Problem List: Decreased strength;Pain      OT Treatment/Interventions:      OT Goals(Current goals can be found in the care plan section) Acute Rehab OT Goals Patient Stated Goal: To go home, to have less pain OT Goal Formulation: With patient  OT Frequency:     Barriers to D/C:            Co-evaluation              AM-PAC OT "6 Clicks" Daily Activity     Outcome Measure Help from another person eating meals?: None Help from another person taking care of personal grooming?: A Little Help from another person toileting, which includes using toliet, bedpan, or urinal?: A Little Help from another person bathing (including washing, rinsing, drying)?: A Little Help from another person to put on and taking off regular upper body clothing?: A Little Help from another person to put on and taking off regular lower body clothing?: A Little 6 Click Score: 19   End of Session Equipment Utilized During Treatment:  (single point [employer]) Nurse Communication: Mobility status  Activity Tolerance: Patient tolerated treatment well Patient left: in chair;with call bell/phone within reach  OT Visit Diagnosis: Pain                Time: 2725-3664 OT Time Calculation (min): 16 min Charges:  OT General Charges $OT Visit: 1 Visit OT Evaluation $OT Eval Low Complexity: 1 Low  Jackquline Denmark, OTS Acute Rehab Office: [phone]  Diana Wood 10/14/2020, 9:05 AM

## 2020-10-14 NOTE — Progress Notes (Signed)
Pharmacy Brief Note - Alvimopan (Entereg)  The standing order set for alvimopan (Entereg) now includes an automatic order to discontinue the drug after the patient has had a bowel movement. The change was approved by the [place] and the Medical Executive Committee.   This patient has had bowel movements documented by nursing. Therefore, alvimopan has been discontinued. If there are questions, please contact the pharmacy at [phone].   Thank you- Peggyann Juba, PharmD, BCPS 10/14/2020 11:42 AM

## 2020-10-15 LAB — GLUCOSE, CAPILLARY
Glucose-Capillary: 100 mg/dL — ABNORMAL HIGH (ref 70–99)
Glucose-Capillary: 92 mg/dL (ref 70–99)

## 2020-10-15 LAB — CBC
HCT: 33.3 % — ABNORMAL LOW (ref 36.0–46.0)
Hemoglobin: 10.5 g/dL — ABNORMAL LOW (ref 12.0–15.0)
MCH: 27.9 pg (ref 26.0–34.0)
MCHC: 31.5 g/dL (ref 30.0–36.0)
MCV: 88.3 fL (ref 80.0–100.0)
Platelets: 301 10*3/uL (ref 150–400)
RBC: 3.77 MIL/uL — ABNORMAL LOW (ref 3.87–5.11)
RDW: 17.9 % — ABNORMAL HIGH (ref 11.5–15.5)
WBC: 12.3 10*3/uL — ABNORMAL HIGH (ref 4.0–10.5)
nRBC: 0 % (ref 0.0–0.2)

## 2020-10-15 LAB — BASIC METABOLIC PANEL
Anion gap: 8 (ref 5–15)
BUN: 13 mg/dL (ref 8–23)
CO2: 22 mmol/L (ref 22–32)
Calcium: 9.4 mg/dL (ref 8.9–10.3)
Chloride: 106 mmol/L (ref 98–111)
Creatinine, Ser: 0.93 mg/dL (ref 0.44–1.00)
GFR, Estimated: 60 mL/min — ABNORMAL LOW (ref >60)
Glucose, Bld: 112 mg/dL — ABNORMAL HIGH (ref 70–99)
Potassium: 4.6 mmol/L (ref 3.5–5.1)
Sodium: 136 mmol/L (ref 135–145)

## 2020-10-15 NOTE — Progress Notes (Signed)
2 Days Post-Op   Subjective/Chief Complaint: Still sore when she moves around, but walking well. Tolerating PO without issue, having bowel movements   Objective: Vital signs in last 24 hours: Temp:  [97.6 F (36.4 C)-98.5 F (36.9 C)] 98.5 F (36.9 C) (10/09 0458) Pulse Rate:  [69-93] 90 (10/09 0458) Resp:  [14-17] 17 (10/09 0458) BP: (128-140)/(50-63) 139/63 (10/09 0458) SpO2:  [95 %-99 %] 97 % (10/09 0458) Weight:  [57.1 kg] 57.1 kg (10/09 0500) Last BM Date: 10/14/20  Intake/Output from previous day: 10/08 0701 - 10/09 0700 In: 2371 [P.O.:1200; I.V.:900; IV Piggyback:271] Out: -  Intake/Output this shift: No intake/output data recorded.  Alert and calm Unlabored respirations Abdomen soft, appropriately tender. Incisions c/d/I, evolving ecchymosis on the left  Lab Results:  Recent Labs    10/14/20 0515 10/15/20 0448  WBC 11.7* 12.3*  HGB 10.4* 10.5*  HCT 31.9* 33.3*  PLT 281 301    BMET Recent Labs    10/14/20 0515 10/15/20 0448  NA 142 136  K 3.3* 4.6  CL 107 106  CO2 26 22  GLUCOSE 112* 112*  BUN 14 13  CREATININE 1.07* 0.93  CALCIUM 9.5 9.4    PT/INR No results for input(s): LABPROT, INR in the last 72 hours. ABG No results for input(s): PHART, HCO3 in the last 72 hours.  Invalid input(s): PCO2, PO2  Studies/Results: No results found.  Anti-infectives: Anti-infectives (From admission, onward)    Start     Dose/Rate Route Frequency Ordered Stop   10/13/20 1400  neomycin (MYCIFRADIN) tablet 1,000 mg  Status:  Discontinued       See Hyperspace for full Linked Orders Report.   1,000 mg Oral 3 times per day 10/13/20 0911 10/13/20 0922   10/13/20 1400  metroNIDAZOLE (FLAGYL) tablet 1,000 mg  Status:  Discontinued       See Hyperspace for full Linked Orders Report.   1,000 mg Oral 3 times per day 10/13/20 0911 10/13/20 0922   10/13/20 0915  cefoTEtan (CEFOTAN) 2 g in sodium chloride 0.9 % 100 mL IVPB        2 g 200 mL/hr over 30 Minutes  Intravenous On call to O.R. 10/13/20 0911 10/13/20 1137       Assessment/Plan: s/p Procedure(s): LAPAROSCOPIC RIGHT HEMI COLECTOMY (Right)  Mobilize Hypokalemia- resolved Slight incr creat- going back down, UOP excellent, monitor for now  Advance to soft diet Discharge this PM  HTN Hypothyroid DM     LOS: 2 days    Clovis Riley 10/15/2020

## 2020-10-15 NOTE — TOC Transition Note (Signed)
Transition of Care [facility]) - CM/SW Discharge Note   Patient Details  Name: ADHYA COCCO MRN: [id number] Date of Birth: August 05, 1932  Transition of Care [facility] CM/SW Contact:  Illene Regulus, LCSW Phone Number: 10/15/2020, 11:46 AM   Clinical Narrative:    CSW spoke with pt at bedside. CSW asked pt about [facility] services, pt declined [place] services, stating her husband is there to help 24/7 and her daughter lives close to pt's home if needed. CSW spoke with pt's daughter, to confirmed pt's address , also informed pt's daughter DME will be delivered to pt's home today. [name] asked about [facility] services , she was informed pt declined. CSW informed pt's daughter when pt gets home ,and changes her mind, she can go through her PCP to obtain those services.CSW confirmed with [facility] [employer]- [employer] walking will be delivered to home today.  TOC CSW Sign off.    Final next level of care: Home/Self Care Barriers to Discharge: No Barriers Identified   Patient Goals and CMS Choice Patient states their goals for this hospitalization and ongoing recovery are:: to go home with husband      Discharge Placement                       Discharge Plan and Services                                     Social Determinants of Health (SDOH) Interventions     Readmission Risk Interventions No flowsheet data found.

## 2020-10-15 NOTE — Discharge Summary (Signed)
Physician Discharge Summary  Patient ID: Diana Wood MRN: [id number] DOB/AGE: 1932/01/23 87 y.o.  Admit date: 10/13/2020 Discharge date: 10/15/2020  Admission Diagnoses: Status post right hemicolectomy  Discharge Diagnoses:  Active Problems:   S/P right hemicolectomy   Discharged Condition: good  Hospital Course: She was admitted for routine postoperative care after uneventful laparoscopic right hemicolectomy.  She progressed appropriately and was deemed stable for discharge on 10/15/2020.  Discharge Exam: Blood pressure (!) 113/44, pulse 60, temperature 98.5 F (36.9 C), temperature source Oral, resp. rate 18, height 5\' 1"  (1.549 m), weight 57.1 kg, SpO2 96 %. See rounding note  Disposition: Discharge disposition: 01-Home or Self Care       Discharge Instructions     Diet - low sodium heart healthy   Complete by: As directed    Increase activity slowly   Complete by: As directed       Allergies as of 10/15/2020       Reactions   Ace Inhibitors Cough   Statins    Other reaction(s): Myalgias   Tramadol Hcl    Other reaction(s): 01/08/17 AMS issue in [place] was related to Tramadol   Losartan Cough   Tape Rash   Band aids ok, paper tape ok,        Medication List     TAKE these medications    Accu-Chek Softclix Lancets lancets 1 each by Other route as needed.   amLODipine 10 MG tablet Commonly known as: NORVASC Take 10 mg by mouth daily.   cyanocobalamin 1000 MCG/ML injection Commonly known as: (VITAMIN B-12) INJECT 1 ML (1,000 MCG TOTAL) INTO THE MUSCLE ONCE FOR 1 DOSE.   fenofibrate 145 MG tablet Commonly known as: TRICOR Take 145 mg by mouth daily after breakfast.   ferrous sulfate 325 (65 FE) MG tablet Take 325 mg by mouth daily.   FISH OIL PO Take 1 tablet by mouth daily.   hydrALAZINE 25 MG tablet Commonly known as: APRESOLINE Take 50 mg by mouth in the morning and at bedtime.   hydrochlorothiazide 12.5 MG tablet Commonly known as:  HYDRODIURIL Take 12.5 mg by mouth daily after breakfast.   levothyroxine 125 MCG tablet Commonly known as: SYNTHROID Take 125 mcg by mouth daily before breakfast.   memantine 5 MG tablet Commonly known as: NAMENDA Take 5 mg by mouth daily.   metFORMIN 500 MG tablet Commonly known as: GLUCOPHAGE Take 500 mg by mouth daily.   nitroGLYCERIN 0.4 MG SL tablet Commonly known as: NITROSTAT Place 1 tablet (0.4 mg total) under the tongue every 5 (five) minutes as needed for chest pain.   omeprazole 20 MG capsule Commonly known as: PRILOSEC Take 20 mg by mouth daily after breakfast.   oxyCODONE 5 MG immediate release tablet Commonly known as: Roxicodone Take 1 tablet (5 mg total) by mouth every 8 (eight) hours as needed for up to 5 days (postop pain not controlled with tylenol first).   prednisoLONE acetate 1 % ophthalmic suspension Commonly known as: PRED FORTE Place 1 drop into both eyes every morning.   sertraline 100 MG tablet Commonly known as: ZOLOFT Take 100 mg by mouth daily.   True Metrix Blood Glucose Test test strip Generic drug: glucose blood 1 each by Other route daily.   Vitamin D 50 MCG (2000 UT) Caps Take 2,000 Units by mouth daily.               Durable Medical Equipment  (From admission, onward)  Start     Ordered   10/15/20 1150  For home use only DME Walker rolling  Once       Question Answer Comment  Walker: With 5 Inch Wheels   Patient needs a walker to treat with the following condition Gait abnormality      10/15/20 1150            Follow-up Information     Ileana Roup, MD Follow up in 2 week(s).   Specialties: General Surgery, Colon and Rectal Surgery Contact information: [place] [id number] [phone]                 Signed: Clovis Riley 10/15/2020, 6:32 PM

## 2020-10-16 ENCOUNTER — Other Ambulatory Visit (HOSPITAL_COMMUNITY): Payer: Self-pay

## 2020-10-18 ENCOUNTER — Other Ambulatory Visit: Payer: Self-pay

## 2020-10-20 DIAGNOSIS — R5381 Other malaise: Secondary | ICD-10-CM | POA: Diagnosis not present

## 2020-10-20 DIAGNOSIS — E039 Hypothyroidism, unspecified: Secondary | ICD-10-CM | POA: Diagnosis not present

## 2020-10-20 DIAGNOSIS — R269 Unspecified abnormalities of gait and mobility: Secondary | ICD-10-CM | POA: Diagnosis not present

## 2020-10-20 DIAGNOSIS — I2510 Atherosclerotic heart disease of native coronary artery without angina pectoris: Secondary | ICD-10-CM | POA: Diagnosis not present

## 2020-10-20 DIAGNOSIS — Z23 Encounter for immunization: Secondary | ICD-10-CM | POA: Diagnosis not present

## 2020-10-20 DIAGNOSIS — E119 Type 2 diabetes mellitus without complications: Secondary | ICD-10-CM | POA: Diagnosis not present

## 2020-10-20 DIAGNOSIS — D509 Iron deficiency anemia, unspecified: Secondary | ICD-10-CM | POA: Diagnosis not present

## 2020-10-20 DIAGNOSIS — D649 Anemia, unspecified: Secondary | ICD-10-CM | POA: Diagnosis not present

## 2020-10-20 DIAGNOSIS — Z950 Presence of cardiac pacemaker: Secondary | ICD-10-CM | POA: Diagnosis not present

## 2020-10-20 DIAGNOSIS — D692 Other nonthrombocytopenic purpura: Secondary | ICD-10-CM | POA: Diagnosis not present

## 2020-10-20 DIAGNOSIS — I119 Hypertensive heart disease without heart failure: Secondary | ICD-10-CM | POA: Diagnosis not present

## 2020-10-20 DIAGNOSIS — C186 Malignant neoplasm of descending colon: Secondary | ICD-10-CM | POA: Diagnosis not present

## 2020-10-22 ENCOUNTER — Other Ambulatory Visit: Payer: Self-pay | Admitting: Hematology and Oncology

## 2020-10-22 DIAGNOSIS — C189 Malignant neoplasm of colon, unspecified: Secondary | ICD-10-CM | POA: Insufficient documentation

## 2020-10-25 DIAGNOSIS — D692 Other nonthrombocytopenic purpura: Secondary | ICD-10-CM | POA: Diagnosis not present

## 2020-10-25 DIAGNOSIS — E039 Hypothyroidism, unspecified: Secondary | ICD-10-CM | POA: Diagnosis not present

## 2020-10-25 DIAGNOSIS — Z483 Aftercare following surgery for neoplasm: Secondary | ICD-10-CM | POA: Diagnosis not present

## 2020-10-25 DIAGNOSIS — M8580 Other specified disorders of bone density and structure, unspecified site: Secondary | ICD-10-CM | POA: Diagnosis not present

## 2020-10-25 DIAGNOSIS — D509 Iron deficiency anemia, unspecified: Secondary | ICD-10-CM | POA: Diagnosis not present

## 2020-10-25 DIAGNOSIS — H9190 Unspecified hearing loss, unspecified ear: Secondary | ICD-10-CM | POA: Diagnosis not present

## 2020-10-25 DIAGNOSIS — E119 Type 2 diabetes mellitus without complications: Secondary | ICD-10-CM | POA: Diagnosis not present

## 2020-10-25 DIAGNOSIS — C186 Malignant neoplasm of descending colon: Secondary | ICD-10-CM | POA: Diagnosis not present

## 2020-10-25 DIAGNOSIS — I119 Hypertensive heart disease without heart failure: Secondary | ICD-10-CM | POA: Diagnosis not present

## 2020-10-25 DIAGNOSIS — D630 Anemia in neoplastic disease: Secondary | ICD-10-CM | POA: Diagnosis not present

## 2020-10-25 DIAGNOSIS — E876 Hypokalemia: Secondary | ICD-10-CM | POA: Diagnosis not present

## 2020-10-25 DIAGNOSIS — M1990 Unspecified osteoarthritis, unspecified site: Secondary | ICD-10-CM | POA: Diagnosis not present

## 2020-10-25 DIAGNOSIS — I2510 Atherosclerotic heart disease of native coronary artery without angina pectoris: Secondary | ICD-10-CM | POA: Diagnosis not present

## 2020-10-25 DIAGNOSIS — E669 Obesity, unspecified: Secondary | ICD-10-CM | POA: Diagnosis not present

## 2020-10-25 DIAGNOSIS — H04129 Dry eye syndrome of unspecified lacrimal gland: Secondary | ICD-10-CM | POA: Diagnosis not present

## 2020-10-25 DIAGNOSIS — K760 Fatty (change of) liver, not elsewhere classified: Secondary | ICD-10-CM | POA: Diagnosis not present

## 2020-10-25 DIAGNOSIS — K219 Gastro-esophageal reflux disease without esophagitis: Secondary | ICD-10-CM | POA: Diagnosis not present

## 2020-10-25 DIAGNOSIS — Z6823 Body mass index (BMI) 23.0-23.9, adult: Secondary | ICD-10-CM | POA: Diagnosis not present

## 2020-10-25 DIAGNOSIS — I252 Old myocardial infarction: Secondary | ICD-10-CM | POA: Diagnosis not present

## 2020-10-25 DIAGNOSIS — I8390 Asymptomatic varicose veins of unspecified lower extremity: Secondary | ICD-10-CM | POA: Diagnosis not present

## 2020-10-25 DIAGNOSIS — I700 Atherosclerosis of aorta: Secondary | ICD-10-CM | POA: Diagnosis not present

## 2020-10-25 DIAGNOSIS — F325 Major depressive disorder, single episode, in full remission: Secondary | ICD-10-CM | POA: Diagnosis not present

## 2020-10-25 DIAGNOSIS — H18519 Endothelial corneal dystrophy, unspecified eye: Secondary | ICD-10-CM | POA: Diagnosis not present

## 2020-10-25 DIAGNOSIS — L821 Other seborrheic keratosis: Secondary | ICD-10-CM | POA: Diagnosis not present

## 2020-10-27 DIAGNOSIS — E119 Type 2 diabetes mellitus without complications: Secondary | ICD-10-CM | POA: Diagnosis not present

## 2020-10-27 DIAGNOSIS — D630 Anemia in neoplastic disease: Secondary | ICD-10-CM | POA: Diagnosis not present

## 2020-10-27 DIAGNOSIS — C186 Malignant neoplasm of descending colon: Secondary | ICD-10-CM | POA: Diagnosis not present

## 2020-10-27 DIAGNOSIS — I119 Hypertensive heart disease without heart failure: Secondary | ICD-10-CM | POA: Diagnosis not present

## 2020-10-27 DIAGNOSIS — D509 Iron deficiency anemia, unspecified: Secondary | ICD-10-CM | POA: Diagnosis not present

## 2020-10-27 DIAGNOSIS — Z483 Aftercare following surgery for neoplasm: Secondary | ICD-10-CM | POA: Diagnosis not present

## 2020-10-31 DIAGNOSIS — C186 Malignant neoplasm of descending colon: Secondary | ICD-10-CM | POA: Diagnosis not present

## 2020-10-31 DIAGNOSIS — Z483 Aftercare following surgery for neoplasm: Secondary | ICD-10-CM | POA: Diagnosis not present

## 2020-10-31 DIAGNOSIS — D630 Anemia in neoplastic disease: Secondary | ICD-10-CM | POA: Diagnosis not present

## 2020-10-31 DIAGNOSIS — E119 Type 2 diabetes mellitus without complications: Secondary | ICD-10-CM | POA: Diagnosis not present

## 2020-10-31 DIAGNOSIS — D509 Iron deficiency anemia, unspecified: Secondary | ICD-10-CM | POA: Diagnosis not present

## 2020-10-31 DIAGNOSIS — I119 Hypertensive heart disease without heart failure: Secondary | ICD-10-CM | POA: Diagnosis not present

## 2020-11-01 ENCOUNTER — Other Ambulatory Visit: Payer: Self-pay

## 2020-11-01 LAB — SURGICAL PATHOLOGY

## 2020-11-01 NOTE — Progress Notes (Signed)
The proposed treatment discussed in conference is for discussion purpose only and is not a binding recommendation.  The patients have not been physically examined, or presented with their treatment options.  Therefore, final treatment plans cannot be decided.  

## 2020-11-02 ENCOUNTER — Other Ambulatory Visit: Payer: Self-pay | Admitting: Hematology and Oncology

## 2020-11-02 DIAGNOSIS — Z483 Aftercare following surgery for neoplasm: Secondary | ICD-10-CM | POA: Diagnosis not present

## 2020-11-02 DIAGNOSIS — D630 Anemia in neoplastic disease: Secondary | ICD-10-CM | POA: Diagnosis not present

## 2020-11-02 DIAGNOSIS — I119 Hypertensive heart disease without heart failure: Secondary | ICD-10-CM | POA: Diagnosis not present

## 2020-11-02 DIAGNOSIS — D509 Iron deficiency anemia, unspecified: Secondary | ICD-10-CM | POA: Diagnosis not present

## 2020-11-02 DIAGNOSIS — E119 Type 2 diabetes mellitus without complications: Secondary | ICD-10-CM | POA: Diagnosis not present

## 2020-11-02 DIAGNOSIS — C189 Malignant neoplasm of colon, unspecified: Secondary | ICD-10-CM

## 2020-11-02 DIAGNOSIS — C186 Malignant neoplasm of descending colon: Secondary | ICD-10-CM | POA: Diagnosis not present

## 2020-11-06 DIAGNOSIS — E1021 Type 1 diabetes mellitus with diabetic nephropathy: Secondary | ICD-10-CM | POA: Diagnosis not present

## 2020-11-07 DIAGNOSIS — D630 Anemia in neoplastic disease: Secondary | ICD-10-CM | POA: Diagnosis not present

## 2020-11-07 DIAGNOSIS — I119 Hypertensive heart disease without heart failure: Secondary | ICD-10-CM | POA: Diagnosis not present

## 2020-11-07 DIAGNOSIS — D509 Iron deficiency anemia, unspecified: Secondary | ICD-10-CM | POA: Diagnosis not present

## 2020-11-07 DIAGNOSIS — E119 Type 2 diabetes mellitus without complications: Secondary | ICD-10-CM | POA: Diagnosis not present

## 2020-11-07 DIAGNOSIS — C186 Malignant neoplasm of descending colon: Secondary | ICD-10-CM | POA: Diagnosis not present

## 2020-11-07 DIAGNOSIS — Z483 Aftercare following surgery for neoplasm: Secondary | ICD-10-CM | POA: Diagnosis not present

## 2020-11-09 DIAGNOSIS — C186 Malignant neoplasm of descending colon: Secondary | ICD-10-CM | POA: Diagnosis not present

## 2020-11-09 DIAGNOSIS — D509 Iron deficiency anemia, unspecified: Secondary | ICD-10-CM | POA: Diagnosis not present

## 2020-11-09 DIAGNOSIS — D630 Anemia in neoplastic disease: Secondary | ICD-10-CM | POA: Diagnosis not present

## 2020-11-09 DIAGNOSIS — I119 Hypertensive heart disease without heart failure: Secondary | ICD-10-CM | POA: Diagnosis not present

## 2020-11-09 DIAGNOSIS — E119 Type 2 diabetes mellitus without complications: Secondary | ICD-10-CM | POA: Diagnosis not present

## 2020-11-09 DIAGNOSIS — Z483 Aftercare following surgery for neoplasm: Secondary | ICD-10-CM | POA: Diagnosis not present

## 2020-11-13 DIAGNOSIS — E119 Type 2 diabetes mellitus without complications: Secondary | ICD-10-CM | POA: Diagnosis not present

## 2020-11-13 DIAGNOSIS — I119 Hypertensive heart disease without heart failure: Secondary | ICD-10-CM | POA: Diagnosis not present

## 2020-11-13 DIAGNOSIS — Z483 Aftercare following surgery for neoplasm: Secondary | ICD-10-CM | POA: Diagnosis not present

## 2020-11-13 DIAGNOSIS — C186 Malignant neoplasm of descending colon: Secondary | ICD-10-CM | POA: Diagnosis not present

## 2020-11-13 DIAGNOSIS — D630 Anemia in neoplastic disease: Secondary | ICD-10-CM | POA: Diagnosis not present

## 2020-11-13 DIAGNOSIS — D509 Iron deficiency anemia, unspecified: Secondary | ICD-10-CM | POA: Diagnosis not present

## 2020-11-21 ENCOUNTER — Ambulatory Visit (INDEPENDENT_AMBULATORY_CARE_PROVIDER_SITE_OTHER): Payer: Medicare Other

## 2020-11-21 DIAGNOSIS — I442 Atrioventricular block, complete: Secondary | ICD-10-CM

## 2020-11-21 LAB — CUP PACEART REMOTE DEVICE CHECK
Battery Remaining Longevity: 48 mo
Battery Remaining Percentage: 43 %
Battery Voltage: 2.96 V
Brady Statistic AP VP Percent: 39 %
Brady Statistic AP VS Percent: 1 %
Brady Statistic AS VP Percent: 61 %
Brady Statistic AS VS Percent: 1 %
Brady Statistic RA Percent Paced: 38 %
Brady Statistic RV Percent Paced: 99 %
Date Time Interrogation Session: 20221114153826
Implantable Lead Implant Date: 20170803
Implantable Lead Implant Date: 20170803
Implantable Lead Location: 753859
Implantable Lead Location: 753860
Implantable Pulse Generator Implant Date: 20170803
Lead Channel Impedance Value: 360 Ohm
Lead Channel Impedance Value: 400 Ohm
Lead Channel Pacing Threshold Amplitude: 0.5 V
Lead Channel Pacing Threshold Amplitude: 0.75 V
Lead Channel Pacing Threshold Pulse Width: 0.5 ms
Lead Channel Pacing Threshold Pulse Width: 0.5 ms
Lead Channel Sensing Intrinsic Amplitude: 1.6 mV
Lead Channel Sensing Intrinsic Amplitude: 7.7 mV
Lead Channel Setting Pacing Amplitude: 1 V
Lead Channel Setting Pacing Amplitude: 2 V
Lead Channel Setting Pacing Pulse Width: 0.5 ms
Lead Channel Setting Sensing Sensitivity: 2 mV
Pulse Gen Model: 2272
Pulse Gen Serial Number: 7929478

## 2020-11-24 DIAGNOSIS — M8580 Other specified disorders of bone density and structure, unspecified site: Secondary | ICD-10-CM | POA: Diagnosis not present

## 2020-11-24 DIAGNOSIS — K219 Gastro-esophageal reflux disease without esophagitis: Secondary | ICD-10-CM | POA: Diagnosis not present

## 2020-11-24 DIAGNOSIS — E876 Hypokalemia: Secondary | ICD-10-CM | POA: Diagnosis not present

## 2020-11-24 DIAGNOSIS — I252 Old myocardial infarction: Secondary | ICD-10-CM | POA: Diagnosis not present

## 2020-11-24 DIAGNOSIS — I119 Hypertensive heart disease without heart failure: Secondary | ICD-10-CM | POA: Diagnosis not present

## 2020-11-24 DIAGNOSIS — M1990 Unspecified osteoarthritis, unspecified site: Secondary | ICD-10-CM | POA: Diagnosis not present

## 2020-11-24 DIAGNOSIS — I2510 Atherosclerotic heart disease of native coronary artery without angina pectoris: Secondary | ICD-10-CM | POA: Diagnosis not present

## 2020-11-24 DIAGNOSIS — E119 Type 2 diabetes mellitus without complications: Secondary | ICD-10-CM | POA: Diagnosis not present

## 2020-11-24 DIAGNOSIS — H04129 Dry eye syndrome of unspecified lacrimal gland: Secondary | ICD-10-CM | POA: Diagnosis not present

## 2020-11-24 DIAGNOSIS — D630 Anemia in neoplastic disease: Secondary | ICD-10-CM | POA: Diagnosis not present

## 2020-11-24 DIAGNOSIS — E039 Hypothyroidism, unspecified: Secondary | ICD-10-CM | POA: Diagnosis not present

## 2020-11-24 DIAGNOSIS — H9190 Unspecified hearing loss, unspecified ear: Secondary | ICD-10-CM | POA: Diagnosis not present

## 2020-11-24 DIAGNOSIS — E669 Obesity, unspecified: Secondary | ICD-10-CM | POA: Diagnosis not present

## 2020-11-24 DIAGNOSIS — K760 Fatty (change of) liver, not elsewhere classified: Secondary | ICD-10-CM | POA: Diagnosis not present

## 2020-11-24 DIAGNOSIS — Z483 Aftercare following surgery for neoplasm: Secondary | ICD-10-CM | POA: Diagnosis not present

## 2020-11-24 DIAGNOSIS — H18519 Endothelial corneal dystrophy, unspecified eye: Secondary | ICD-10-CM | POA: Diagnosis not present

## 2020-11-24 DIAGNOSIS — D509 Iron deficiency anemia, unspecified: Secondary | ICD-10-CM | POA: Diagnosis not present

## 2020-11-24 DIAGNOSIS — C186 Malignant neoplasm of descending colon: Secondary | ICD-10-CM | POA: Diagnosis not present

## 2020-11-24 DIAGNOSIS — I8390 Asymptomatic varicose veins of unspecified lower extremity: Secondary | ICD-10-CM | POA: Diagnosis not present

## 2020-11-24 DIAGNOSIS — L821 Other seborrheic keratosis: Secondary | ICD-10-CM | POA: Diagnosis not present

## 2020-11-24 DIAGNOSIS — Z6823 Body mass index (BMI) 23.0-23.9, adult: Secondary | ICD-10-CM | POA: Diagnosis not present

## 2020-11-24 DIAGNOSIS — D692 Other nonthrombocytopenic purpura: Secondary | ICD-10-CM | POA: Diagnosis not present

## 2020-11-24 DIAGNOSIS — I700 Atherosclerosis of aorta: Secondary | ICD-10-CM | POA: Diagnosis not present

## 2020-11-24 DIAGNOSIS — F325 Major depressive disorder, single episode, in full remission: Secondary | ICD-10-CM | POA: Diagnosis not present

## 2020-11-27 ENCOUNTER — Other Ambulatory Visit: Payer: Medicare Other

## 2020-11-27 ENCOUNTER — Ambulatory Visit: Payer: Medicare Other | Admitting: Hematology and Oncology

## 2020-11-27 DIAGNOSIS — D509 Iron deficiency anemia, unspecified: Secondary | ICD-10-CM | POA: Diagnosis not present

## 2020-11-27 DIAGNOSIS — I119 Hypertensive heart disease without heart failure: Secondary | ICD-10-CM | POA: Diagnosis not present

## 2020-11-27 DIAGNOSIS — E119 Type 2 diabetes mellitus without complications: Secondary | ICD-10-CM | POA: Diagnosis not present

## 2020-11-27 DIAGNOSIS — Z483 Aftercare following surgery for neoplasm: Secondary | ICD-10-CM | POA: Diagnosis not present

## 2020-11-27 DIAGNOSIS — D630 Anemia in neoplastic disease: Secondary | ICD-10-CM | POA: Diagnosis not present

## 2020-11-27 DIAGNOSIS — C186 Malignant neoplasm of descending colon: Secondary | ICD-10-CM | POA: Diagnosis not present

## 2020-11-29 NOTE — Progress Notes (Signed)
Remote pacemaker transmission.   

## 2020-12-04 ENCOUNTER — Other Ambulatory Visit: Payer: Self-pay

## 2020-12-04 ENCOUNTER — Emergency Department (HOSPITAL_BASED_OUTPATIENT_CLINIC_OR_DEPARTMENT_OTHER)
Admission: EM | Admit: 2020-12-04 | Discharge: 2020-12-04 | Disposition: A | Discharge status: Home / Self Care | Payer: Medicare Other | Attending: Emergency Medicine | Admitting: Emergency Medicine

## 2020-12-04 ENCOUNTER — Encounter (HOSPITAL_BASED_OUTPATIENT_CLINIC_OR_DEPARTMENT_OTHER): Payer: Self-pay | Admitting: Emergency Medicine

## 2020-12-04 DIAGNOSIS — Z8542 Personal history of malignant neoplasm of other parts of uterus: Secondary | ICD-10-CM | POA: Insufficient documentation

## 2020-12-04 DIAGNOSIS — Z7984 Long term (current) use of oral hypoglycemic drugs: Secondary | ICD-10-CM | POA: Diagnosis not present

## 2020-12-04 DIAGNOSIS — Z79899 Other long term (current) drug therapy: Secondary | ICD-10-CM | POA: Diagnosis not present

## 2020-12-04 DIAGNOSIS — Z20822 Contact with and (suspected) exposure to covid-19: Secondary | ICD-10-CM | POA: Diagnosis not present

## 2020-12-04 DIAGNOSIS — E119 Type 2 diabetes mellitus without complications: Secondary | ICD-10-CM | POA: Diagnosis not present

## 2020-12-04 DIAGNOSIS — R197 Diarrhea, unspecified: Secondary | ICD-10-CM

## 2020-12-04 DIAGNOSIS — R5383 Other fatigue: Secondary | ICD-10-CM | POA: Diagnosis not present

## 2020-12-04 DIAGNOSIS — I119 Hypertensive heart disease without heart failure: Secondary | ICD-10-CM | POA: Insufficient documentation

## 2020-12-04 DIAGNOSIS — R9431 Abnormal electrocardiogram [ECG] [EKG]: Secondary | ICD-10-CM | POA: Diagnosis not present

## 2020-12-04 DIAGNOSIS — Z96653 Presence of artificial knee joint, bilateral: Secondary | ICD-10-CM | POA: Diagnosis not present

## 2020-12-04 DIAGNOSIS — E039 Hypothyroidism, unspecified: Secondary | ICD-10-CM | POA: Diagnosis not present

## 2020-12-04 DIAGNOSIS — Z951 Presence of aortocoronary bypass graft: Secondary | ICD-10-CM | POA: Diagnosis not present

## 2020-12-04 DIAGNOSIS — Z8616 Personal history of COVID-19: Secondary | ICD-10-CM | POA: Diagnosis not present

## 2020-12-04 DIAGNOSIS — J101 Influenza due to other identified influenza virus with other respiratory manifestations: Secondary | ICD-10-CM | POA: Diagnosis not present

## 2020-12-04 DIAGNOSIS — W19XXXA Unspecified fall, initial encounter: Secondary | ICD-10-CM | POA: Diagnosis not present

## 2020-12-04 DIAGNOSIS — I2510 Atherosclerotic heart disease of native coronary artery without angina pectoris: Secondary | ICD-10-CM | POA: Insufficient documentation

## 2020-12-04 DIAGNOSIS — R509 Fever, unspecified: Secondary | ICD-10-CM | POA: Diagnosis present

## 2020-12-04 LAB — CBC WITH DIFFERENTIAL/PLATELET
Abs Immature Granulocytes: 0.04 10*3/uL (ref 0.00–0.07)
Basophils Absolute: 0 10*3/uL (ref 0.0–0.1)
Basophils Relative: 0 %
Eosinophils Absolute: 0 10*3/uL (ref 0.0–0.5)
Eosinophils Relative: 0 %
HCT: 37.6 % (ref 36.0–46.0)
Hemoglobin: 12.3 g/dL (ref 12.0–15.0)
Immature Granulocytes: 0 %
Lymphocytes Relative: 38 %
Lymphs Abs: 4.4 10*3/uL — ABNORMAL HIGH (ref 0.7–4.0)
MCH: 28.3 pg (ref 26.0–34.0)
MCHC: 32.7 g/dL (ref 30.0–36.0)
MCV: 86.6 fL (ref 80.0–100.0)
Monocytes Absolute: 1.1 10*3/uL — ABNORMAL HIGH (ref 0.1–1.0)
Monocytes Relative: 9 %
Neutro Abs: 6.2 10*3/uL (ref 1.7–7.7)
Neutrophils Relative %: 53 %
Platelets: 280 10*3/uL (ref 150–400)
RBC: 4.34 MIL/uL (ref 3.87–5.11)
RDW: 15.2 % (ref 11.5–15.5)
WBC: 11.7 10*3/uL — ABNORMAL HIGH (ref 4.0–10.5)
nRBC: 0 % (ref 0.0–0.2)

## 2020-12-04 LAB — COMPREHENSIVE METABOLIC PANEL
ALT: 33 U/L (ref 0–44)
AST: 100 U/L — ABNORMAL HIGH (ref 15–41)
Albumin: 4.1 g/dL (ref 3.5–5.0)
Alkaline Phosphatase: 39 U/L (ref 38–126)
Anion gap: 13 (ref 5–15)
BUN: 21 mg/dL (ref 8–23)
CO2: 24 mmol/L (ref 22–32)
Calcium: 10 mg/dL (ref 8.9–10.3)
Chloride: 96 mmol/L — ABNORMAL LOW (ref 98–111)
Creatinine, Ser: 0.96 mg/dL (ref 0.44–1.00)
GFR, Estimated: 58 mL/min — ABNORMAL LOW (ref >60)
Glucose, Bld: 101 mg/dL — ABNORMAL HIGH (ref 70–99)
Potassium: 3.2 mmol/L — ABNORMAL LOW (ref 3.5–5.1)
Sodium: 133 mmol/L — ABNORMAL LOW (ref 135–145)
Total Bilirubin: 0.6 mg/dL (ref 0.3–1.2)
Total Protein: 6.9 g/dL (ref 6.5–8.1)

## 2020-12-04 LAB — RESP PANEL BY RT-PCR (FLU A&B, COVID) ARPGX2
Influenza A by PCR: POSITIVE — AB
Influenza B by PCR: NEGATIVE
SARS Coronavirus 2 by RT PCR: NEGATIVE

## 2020-12-04 MED ORDER — OSELTAMIVIR PHOSPHATE 75 MG PO CAPS: 75.0000 mg | ORAL_CAPSULE | Freq: Two times a day (BID) | ORAL | 0 refills | Status: DC

## 2020-12-04 MED ORDER — POTASSIUM CHLORIDE CRYS ER 20 MEQ PO TBCR
40.0000 meq | EXTENDED_RELEASE_TABLET | Freq: Once | ORAL | Status: AC
  Administered 2020-12-04: 21:00:00 40 meq via ORAL
  Filled 2020-12-04: qty 2

## 2020-12-04 NOTE — ED Triage Notes (Signed)
Pt via pov from home after several falls Saturday. Pt's family called EMS and they told them pt was dehydrated. Pt's pcp wanted her evaluated for dehydration and possible anemia, as she has hx of same. Pt fell twice Saturday and slept most of the day yesterday. Pt has had diarrhea/loose stools for a month, even though she has been taking iron pills. Pt alert & oriented, HOH (hearing aids dont' work well enough). NAD noted. Daughter accompanies pt.

## 2020-12-04 NOTE — ED Provider Notes (Signed)
[facility] EMERGENCY DEPT Provider Note   CSN: [id number] Arrival date & time: 12/04/20  1616     History Chief Complaint  Patient presents with   Fever   Fall    Diana Wood is a 87 y.o. female.  The history is provided by the patient and medical records. No language interpreter was used.  Fever Max temp prior to arrival:  101 Temp source:  Oral Severity:  Moderate Onset quality:  Gradual Duration:  2 days Timing:  Constant Progression:  Waxing and waning Chronicity:  New Relieved by:  Nothing Worsened by:  Nothing Ineffective treatments:  None tried Associated symptoms: chills, congestion, cough, diarrhea and sore throat   Associated symptoms: no chest pain, no confusion, no dysuria, no headaches, no nausea, no rash, no somnolence and no vomiting   Risk factors: hx of cancer   Risk factors: no sick contacts   Fall The current episode started 2 days ago. The problem occurs rarely. The problem has been resolved. Pertinent negatives include no chest pain, no abdominal pain, no headaches and no shortness of breath. Nothing aggravates the symptoms. Nothing relieves the symptoms. She has tried nothing for the symptoms. The treatment provided no relief.      Past Medical History:  Diagnosis Date   Anemia    iron infusions   Arthritis    CAD (coronary artery disease)    a. s/p CABG 2008.   Chest pain    Deep vein thrombosis (HCC)    Diabetes mellitus    GERD (gastroesophageal reflux disease)    Hiatal hernia    Hypercholesterolemia    Hyperlipidemia    Hypertension    Hypothyroid    Obesity    Pleural effusion    Uterine cancer [facility])     Patient Active Problem List   Diagnosis Date Noted   Adenocarcinoma of colon ([place]) 10/22/2020   S/P right hemicolectomy 10/13/2020   Iron deficiency anemia due to chronic blood loss 08/16/2020   Complete heart block ([place]) 08/10/2015   Complete AV block ([place])    Heart block 08/04/2015   Postop  Hyponatremia 03/17/2012   Postoperative anemia due to acute blood loss 03/17/2012   OA (osteoarthritis) of knee 03/16/2012   BRONCHITIS NOT SPECIFIED AS ACUTE OR CHRONIC 01/11/2009   DEGENERATIVE JOINT DISEASE, BOTH KNEES, SEVERE 01/11/2009   COUGH 01/11/2009   Hypothyroidism 07/02/2008   Type 2 diabetes mellitus with HbA1C goal below 7.5 07/02/2008   HYPERCHOLESTEROLEMIA 07/02/2008   HYPERLIPIDEMIA 07/02/2008   OBESITY 07/02/2008   ANEMIA 07/02/2008   Essential hypertension 07/02/2008   Coronary atherosclerosis 07/02/2008   PLEURAL EFFUSION 07/02/2008   GERD 07/02/2008   HIATAL HERNIA 07/02/2008   CHEST PAIN 07/02/2008   UTERINE CANCER, HX OF 07/02/2008    Past Surgical History:  Procedure Laterality Date   ABDOMINAL HYSTERECTOMY     bilateral feet surgery     CATARACT EXTRACTION, BILATERAL     CORONARY ARTERY BYPASS GRAFT     x 4-11'08-Dr. Bartle([facility])   endoscopic vein harvesting from both thighs     Gilford Raid MD   EP IMPLANTABLE DEVICE N/A 08/10/2015   Procedure: Pacemaker Implant;  Surgeon: Will Meredith Leeds, MD;  Location: [facility];  Service: Cardiovascular;  Laterality: N/A;   excision cyst debridement fusion distal interphalangeal joint ring finger  with Mini Acutrak screw 20 mm in length     EYE SURGERY     corneal transplant -right   hysterectomy  01/07/1998  dx of uterine cancer   JOINT REPLACEMENT     right knee replacement-Dr. supple   LAPAROSCOPIC RIGHT HEMI COLECTOMY Right 10/13/2020   Procedure: LAPAROSCOPIC RIGHT HEMI COLECTOMY;  Surgeon: Ileana Roup, MD;  Location: [facility];  Service: General;  Laterality: Right;   surgery on 2 fingers     r HAND   TOTAL KNEE ARTHROPLASTY Left 03/16/2012   Procedure: TOTAL KNEE ARTHROPLASTY;  Surgeon: Gearlean Alf, MD;  Location: [facility];  Service: Orthopedics;  Laterality: Left;     OB History   No obstetric history on file.     Family History  Problem Relation Age of Onset   Cancer  Mother        type unknown   Diabetes Mother    Blindness Brother        partial from menigitis   Hyperlipidemia Daughter    Thyroid disease Daughter    Colon cancer Neg Hx     Social History   Tobacco Use   Smoking status: Never   Smokeless tobacco: Never  Vaping Use   Vaping Use: Never used  Substance Use Topics   Alcohol use: No   Drug use: No    Home Medications Prior to Admission medications   Medication Sig Start Date End Date Taking? Authorizing Provider  Accu-Chek Softclix Lancets lancets 1 each by Other route as needed. 03/27/19   [provider]  amLODipine (NORVASC) 10 MG tablet Take 10 mg by mouth daily.    [provider]  Cholecalciferol (VITAMIN D) 50 MCG (2000 UT) CAPS Take 2,000 Units by mouth daily.    [provider]  cyanocobalamin (,VITAMIN B-12,) 1000 MCG/ML injection INJECT 1 ML (1,000 MCG TOTAL) INTO THE MUSCLE ONCE FOR 1 DOSE. 08/18/20   Irene Shipper, MD  fenofibrate (TRICOR) 145 MG tablet Take 145 mg by mouth daily after breakfast.    [provider]  ferrous sulfate 325 (65 FE) MG tablet Take 325 mg by mouth daily. 03/26/18   [provider]  hydrALAZINE (APRESOLINE) 25 MG tablet Take 50 mg by mouth in the morning and at bedtime.    [provider]  hydrochlorothiazide (HYDRODIURIL) 12.5 MG tablet Take 12.5 mg by mouth daily after breakfast.     [provider]  levothyroxine (SYNTHROID) 125 MCG tablet Take 125 mcg by mouth daily before breakfast. 04/07/20   [provider]  memantine (NAMENDA) 5 MG tablet Take 5 mg by mouth daily.    [provider]  metFORMIN (GLUCOPHAGE) 500 MG tablet Take 500 mg by mouth daily. 06/12/17   [provider]  nitroGLYCERIN (NITROSTAT) 0.4 MG SL tablet Place 1 tablet (0.4 mg total) under the tongue every 5 (five) minutes as needed for chest pain. 10/13/19   Josue Hector, MD  Omega-3 Fatty Acids (FISH OIL PO) Take 1 tablet by mouth  daily.    [provider]  omeprazole (PRILOSEC) 20 MG capsule Take 20 mg by mouth daily after breakfast.    [provider]  prednisoLONE acetate (PRED FORTE) 1 % ophthalmic suspension Place 1 drop into both eyes every morning.    [provider]  sertraline (ZOLOFT) 100 MG tablet Take 100 mg by mouth daily. 06/09/20   [provider]  TRUE METRIX BLOOD GLUCOSE TEST test strip 1 each by Other route daily. 03/27/19   [provider]    Allergies    Ace inhibitors, Statins, Tramadol hcl, Losartan, and Tape  Review  of Systems   Review of Systems  Constitutional:  Positive for chills, fatigue and fever. Negative for diaphoresis.  HENT:  Positive for congestion and sore throat.   Eyes:  Negative for visual disturbance.  Respiratory:  Positive for cough. Negative for chest tightness, shortness of breath and wheezing.   Cardiovascular:  Negative for chest pain, palpitations and leg swelling.  Gastrointestinal:  Positive for diarrhea. Negative for abdominal pain, constipation, nausea and vomiting.  Genitourinary:  Negative for dysuria and flank pain.  Musculoskeletal:  Negative for back pain.  Skin:  Negative for rash.  Neurological:  Negative for dizziness, syncope, light-headedness, numbness and headaches.  Psychiatric/Behavioral:  Negative for agitation and confusion.   All other systems reviewed and are negative.  Physical Exam Updated Vital Signs BP 123/60   Pulse 83   Temp 98.9 F (37.2 C) (Oral)   Resp 18   Ht 5\' 1"  (1.549 m)   Wt 57.2 kg   SpO2 95%   BMI 23.81 kg/m   Physical Exam Vitals and nursing note reviewed.  Constitutional:      General: She is not in acute distress.    Appearance: She is well-developed. She is not ill-appearing, toxic-appearing or diaphoretic.  HENT:     Head: Normocephalic and atraumatic.     Nose: No congestion or rhinorrhea.     Mouth/Throat:     Mouth: Mucous membranes are moist.     Pharynx: No  oropharyngeal exudate or posterior oropharyngeal erythema.  Eyes:     Extraocular Movements: Extraocular movements intact.     Conjunctiva/sclera: Conjunctivae normal.     Pupils: Pupils are equal, round, and reactive to light.  Cardiovascular:     Rate and Rhythm: Normal rate and regular rhythm.     Heart sounds: No murmur heard. Pulmonary:     Effort: Pulmonary effort is normal. No respiratory distress.     Breath sounds: No stridor. No wheezing, rhonchi or rales.  Chest:     Chest wall: No tenderness.  Abdominal:     General: Abdomen is flat. There is no distension.     Palpations: Abdomen is soft.     Tenderness: There is no abdominal tenderness. There is no right CVA tenderness, left CVA tenderness, guarding or rebound.  Musculoskeletal:        General: No swelling or tenderness.     Cervical back: Neck supple. No tenderness.  Skin:    General: Skin is warm and dry.     Capillary Refill: Capillary refill takes less than 2 seconds.     Findings: No erythema.  Neurological:     General: No focal deficit present.     Mental Status: She is alert. Mental status is at baseline.     Sensory: No sensory deficit.     Motor: No weakness.  Psychiatric:        Mood and Affect: Mood normal.    ED Results / Procedures / Treatments   Labs (all labs ordered are listed, but only abnormal results are displayed) Labs Reviewed  RESP PANEL BY RT-PCR (FLU A&B, COVID) ARPGX2 - Abnormal; Notable for the following components:      Result Value   Influenza A by PCR POSITIVE (*)    All other components within normal limits  COMPREHENSIVE METABOLIC PANEL - Abnormal; Notable for the following components:   Sodium 133 (*)    Potassium 3.2 (*)    Chloride 96 (*)    Glucose, Bld 101 (*)  AST 100 (*)    GFR, Estimated 58 (*)    All other components within normal limits  CBC WITH DIFFERENTIAL/PLATELET - Abnormal; Notable for the following components:   WBC 11.7 (*)    Lymphs Abs 4.4 (*)     Monocytes Absolute 1.1 (*)    All other components within normal limits  URINALYSIS, ROUTINE W REFLEX MICROSCOPIC    EKG EKG Interpretation  Date/Time:  Monday December 04 2020 17:01:30 EST Ventricular Rate:  69 PR Interval:    QRS Duration: 152 QT Interval:  444 QTC Calculation: 475 R Axis:   -68 Text Interpretation: Ventricular-paced rhythm Abnormal ECG When compared to prior, similar paced rhythm with more artifact. No STEMI Confirmed by Antony Blackbird [phone]) on 12/04/2020 5:06:39 PM  Radiology No results found.  Procedures Procedures   Medications Ordered in ED Medications  potassium chloride SA (KLOR-CON) CR tablet 40 mEq (40 mEq Oral Given 12/04/20 2045)    ED Course  I have reviewed the triage vital signs and the nursing notes.  Pertinent labs & imaging results that were available during my care of the patient were reviewed by me and considered in my medical decision making (see chart for details).    MDM Rules/Calculators/A&P                           Diana Wood is a 87 y.o. female with a complex past medical history including CAD status post CABG, hypertension, hypothyroidism, previous DVT, hyperlipidemia, cancer status post hemicolectomy, previous complete heart block with pacemaker, and previous anemia who presents with 2 days of cough, fatigue, diarrhea, and malaise.  Patient reports that since overnight Saturday she is having the symptoms.  She says she feels very tired and had to what sounds were mechanical falls on Saturday.  She denies any nausea or vomiting does report diarrhea.  She is a decreased appetite and decreased oral intake.  She denies any chest pain, abdominal pain, back pain, headache, or neck pain at this time.  She says that she did not hit her head during these falls.  She denies any palpitations or other thoracic symptoms aside from the cough and fatigue.  On exam, lungs had some mild coarseness but no focal rhonchi.  No wheezing or rales.   Chest, back, and abdomen are nontender.  Good pulses in extremities.  No focal neurologic deficits with normal sensation and strength in extremities, normal finger-nose-finger testing, pupils symmetric and reactive with normal extraocular movements, and patient had clear speech.  Patient well-appearing and vital signs reassuring.  She had a blood pressure 120/80 and was afebrile and not tachycardic.  Oxygen saturations were in the mid 90s during my evaluation on room air.  Clinically I do suspect she has a viral infection and she had a COVID/flu test in the waiting room and was positive for influenza A.  This is consistent with the outbreaks in the community.  Suspect this is the cause of her symptoms.  Given her lack of chest pain, shortness of breath, and lack of focal rhonchi, family agreed to hold on chest x-ray at this time.  As she did not hit her head and otherwise is acting normally, they agreed to hold on imaging of the head at this time.  They are requesting she get Tamiflu as her symptoms started within the last 48 hours or so.  They feel that she is appropriate to go home and will follow-up with  her PCP later this week.  She is seeing her cancer doctor on Friday.  She will alert him to her new flu diagnosis and see if she needs to reschedule.  Given her otherwise well appearance and overall reassuring exam, I do feel she is safe for discharge home.  They understand extremely strict return precautions for any new or worsened symptoms and patient was discharged in good condition.  Final Clinical Impression(s) / ED Diagnoses Final diagnoses:  Influenza A  Diarrhea, unspecified type  Other fatigue    Rx / DC Orders ED Discharge Orders          Ordered    oseltamivir (TAMIFLU) 75 MG capsule  Every 12 hours        12/04/20 2053           Clinical Impression: 1. Influenza A   2. Diarrhea, unspecified type   3. Other fatigue     Disposition: Discharge  Condition: Good  I have  discussed the results, Dx and Tx plan with the pt(& family if present). He/she/they expressed understanding and agree(s) with the plan. Discharge instructions discussed at great length. Strict return precautions discussed and pt &/or family have verbalized understanding of the instructions. No further questions at time of discharge.    New Prescriptions   OSELTAMIVIR (TAMIFLU) 75 MG CAPSULE    Take 1 capsule (75 mg total) by mouth every 12 (twelve) hours.    Follow Up: [name], MD [place] [address] [phone]     [facility] [place] [address] [phone]       Samaria Anes, Gwenyth Allegra, MD 12/04/20 2103

## 2020-12-04 NOTE — Discharge Instructions (Signed)
Her history, exam, work-up today are consistent with influenza causing her fatigue, cough, and diarrhea.  She did have mild low potassium which we repleted here.  Based on her reassuring exam and vital signs we do feel she is safe for discharge home however please have her take the Tamiflu which we discussed and you all wanted to have her try taking.  Please have her follow-up with her primary team.  Please have her rest and stay hydrated.  If any symptoms change or worsen acutely, please return to the nearest emergency department.

## 2020-12-05 ENCOUNTER — Telehealth: Payer: Self-pay | Admitting: *Deleted

## 2020-12-05 NOTE — Telephone Encounter (Signed)
Returned PC to patient's daughter, Coralyn Mark, she states the patient tested positive for flu A last night, she has appointment this Friday with Dr. Lorenso Courier.  Informed daughter that patient needs to be 7 days post positive test or have a negative flu PCR before coming to an appointment.  Scheduling message sent to reschedule the week of 12/18/20 per daughter's request.  Daughter also states they have questions about scheduled genetic testing and what this entails.  Dr. Lorenso Courier informed, states he will call patient.

## 2020-12-06 ENCOUNTER — Telehealth: Payer: Self-pay | Admitting: *Deleted

## 2020-12-06 DIAGNOSIS — E1021 Type 1 diabetes mellitus with diabetic nephropathy: Secondary | ICD-10-CM | POA: Diagnosis not present

## 2020-12-06 NOTE — Telephone Encounter (Signed)
Received call from pt's daughter. Diana Wood states that her mother tested + for Flu A and has started Tamiflu.  She states she also has diarrhea which started last week. She states the diarrhea is 6-8 x a day. She states the patient was told to start imodium but she has not.  Diana Wood states the toilet water is a little pink after loose stools and had a little bit of blood on the tissue paper. Advised that the diarrhea good be irritating her rectal/an/perianal area . Advised that she needs to start the Imodium today. Instructed on how to take the Imodium. Also advised to have her eat BRAT like diet and to increase her oral fluids (gatorade, pedialyte etc) as well as water to keep from getting dehydrated. Due to pt having the Flu, advised we need to change her appt freom 12/2 to the following week. Diana Wood said she would out of town that week, so we can get it scheduled for 12/22/20 Advised that pt's HGB is stable from 12/04/20  Advised that pot needs to take the imodium to get diarrhea under control.  Diana Wood voiced understanding. She knows to call with future questions or concerns

## 2020-12-08 ENCOUNTER — Inpatient Hospital Stay: Payer: Medicare Other | Admitting: Hematology and Oncology

## 2020-12-08 ENCOUNTER — Inpatient Hospital Stay: Payer: Medicare Other

## 2020-12-13 DIAGNOSIS — Z483 Aftercare following surgery for neoplasm: Secondary | ICD-10-CM | POA: Diagnosis not present

## 2020-12-13 DIAGNOSIS — I119 Hypertensive heart disease without heart failure: Secondary | ICD-10-CM | POA: Diagnosis not present

## 2020-12-13 DIAGNOSIS — D509 Iron deficiency anemia, unspecified: Secondary | ICD-10-CM | POA: Diagnosis not present

## 2020-12-13 DIAGNOSIS — D630 Anemia in neoplastic disease: Secondary | ICD-10-CM | POA: Diagnosis not present

## 2020-12-13 DIAGNOSIS — E119 Type 2 diabetes mellitus without complications: Secondary | ICD-10-CM | POA: Diagnosis not present

## 2020-12-13 DIAGNOSIS — C186 Malignant neoplasm of descending colon: Secondary | ICD-10-CM | POA: Diagnosis not present

## 2020-12-19 DIAGNOSIS — C186 Malignant neoplasm of descending colon: Secondary | ICD-10-CM | POA: Diagnosis not present

## 2020-12-19 DIAGNOSIS — D630 Anemia in neoplastic disease: Secondary | ICD-10-CM | POA: Diagnosis not present

## 2020-12-19 DIAGNOSIS — I119 Hypertensive heart disease without heart failure: Secondary | ICD-10-CM | POA: Diagnosis not present

## 2020-12-19 DIAGNOSIS — E119 Type 2 diabetes mellitus without complications: Secondary | ICD-10-CM | POA: Diagnosis not present

## 2020-12-19 DIAGNOSIS — D509 Iron deficiency anemia, unspecified: Secondary | ICD-10-CM | POA: Diagnosis not present

## 2020-12-19 DIAGNOSIS — Z483 Aftercare following surgery for neoplasm: Secondary | ICD-10-CM | POA: Diagnosis not present

## 2020-12-22 ENCOUNTER — Other Ambulatory Visit: Payer: Self-pay | Admitting: Hematology and Oncology

## 2020-12-22 ENCOUNTER — Inpatient Hospital Stay: Payer: Medicare Other | Attending: Hematology and Oncology

## 2020-12-22 ENCOUNTER — Inpatient Hospital Stay (HOSPITAL_BASED_OUTPATIENT_CLINIC_OR_DEPARTMENT_OTHER): Payer: Medicare Other | Admitting: Hematology and Oncology

## 2020-12-22 ENCOUNTER — Other Ambulatory Visit: Payer: Self-pay

## 2020-12-22 VITALS — BP 145/78 | HR 72 | Temp 98.2°F | Resp 17 | Wt 118.1 lb

## 2020-12-22 DIAGNOSIS — D500 Iron deficiency anemia secondary to blood loss (chronic): Secondary | ICD-10-CM | POA: Diagnosis not present

## 2020-12-22 DIAGNOSIS — C182 Malignant neoplasm of ascending colon: Secondary | ICD-10-CM | POA: Insufficient documentation

## 2020-12-22 DIAGNOSIS — Z821 Family history of blindness and visual loss: Secondary | ICD-10-CM | POA: Diagnosis not present

## 2020-12-22 DIAGNOSIS — C189 Malignant neoplasm of colon, unspecified: Secondary | ICD-10-CM

## 2020-12-22 DIAGNOSIS — Z79899 Other long term (current) drug therapy: Secondary | ICD-10-CM | POA: Diagnosis not present

## 2020-12-22 DIAGNOSIS — Z833 Family history of diabetes mellitus: Secondary | ICD-10-CM | POA: Insufficient documentation

## 2020-12-22 DIAGNOSIS — D539 Nutritional anemia, unspecified: Secondary | ICD-10-CM

## 2020-12-22 DIAGNOSIS — Z8349 Family history of other endocrine, nutritional and metabolic diseases: Secondary | ICD-10-CM | POA: Diagnosis not present

## 2020-12-22 DIAGNOSIS — K922 Gastrointestinal hemorrhage, unspecified: Secondary | ICD-10-CM | POA: Diagnosis not present

## 2020-12-22 DIAGNOSIS — Z809 Family history of malignant neoplasm, unspecified: Secondary | ICD-10-CM | POA: Diagnosis not present

## 2020-12-22 LAB — RETIC PANEL
Immature Retic Fract: 8.1 % (ref 2.3–15.9)
RBC.: 4.09 MIL/uL (ref 3.87–5.11)
Retic Count, Absolute: 52.4 10*3/uL (ref 19.0–186.0)
Retic Ct Pct: 1.3 % (ref 0.4–3.1)
Reticulocyte Hemoglobin: 33.7 pg (ref >27.9)

## 2020-12-22 LAB — CBC WITH DIFFERENTIAL (CANCER CENTER ONLY)
Abs Immature Granulocytes: 0.01 10*3/uL (ref 0.00–0.07)
Basophils Absolute: 0.1 10*3/uL (ref 0.0–0.1)
Basophils Relative: 1 %
Eosinophils Absolute: 0 10*3/uL (ref 0.0–0.5)
Eosinophils Relative: 0 %
HCT: 35.8 % — ABNORMAL LOW (ref 36.0–46.0)
Hemoglobin: 11.8 g/dL — ABNORMAL LOW (ref 12.0–15.0)
Immature Granulocytes: 0 %
Lymphocytes Relative: 45 %
Lymphs Abs: 3.7 10*3/uL (ref 0.7–4.0)
MCH: 28.8 pg (ref 26.0–34.0)
MCHC: 33 g/dL (ref 30.0–36.0)
MCV: 87.3 fL (ref 80.0–100.0)
Monocytes Absolute: 0.9 10*3/uL (ref 0.1–1.0)
Monocytes Relative: 11 %
Neutro Abs: 3.5 10*3/uL (ref 1.7–7.7)
Neutrophils Relative %: 43 %
Platelet Count: 275 10*3/uL (ref 150–400)
RBC: 4.1 MIL/uL (ref 3.87–5.11)
RDW: 14.6 % (ref 11.5–15.5)
WBC Count: 8 10*3/uL (ref 4.0–10.5)
nRBC: 0 % (ref 0.0–0.2)

## 2020-12-22 LAB — CMP (CANCER CENTER ONLY)
ALT: 26 U/L (ref 0–44)
AST: 47 U/L — ABNORMAL HIGH (ref 15–41)
Albumin: 3.6 g/dL (ref 3.5–5.0)
Alkaline Phosphatase: 63 U/L (ref 38–126)
Anion gap: 10 (ref 5–15)
BUN: 27 mg/dL — ABNORMAL HIGH (ref 8–23)
CO2: 24 mmol/L (ref 22–32)
Calcium: 9.5 mg/dL (ref 8.9–10.3)
Chloride: 107 mmol/L (ref 98–111)
Creatinine: 1.01 mg/dL — ABNORMAL HIGH (ref 0.44–1.00)
GFR, Estimated: 54 mL/min — ABNORMAL LOW (ref >60)
Glucose, Bld: 104 mg/dL — ABNORMAL HIGH (ref 70–99)
Potassium: 3.9 mmol/L (ref 3.5–5.1)
Sodium: 141 mmol/L (ref 135–145)
Total Bilirubin: 0.4 mg/dL (ref 0.3–1.2)
Total Protein: 7.1 g/dL (ref 6.5–8.1)

## 2020-12-22 LAB — IRON AND TIBC
Iron: 61 ug/dL (ref 41–142)
Saturation Ratios: 19 % — ABNORMAL LOW (ref 21–57)
TIBC: 325 ug/dL (ref 236–444)
UIBC: 264 ug/dL (ref 120–384)

## 2020-12-22 LAB — FERRITIN: Ferritin: 232 ng/mL (ref 11–307)

## 2020-12-22 NOTE — Progress Notes (Signed)
[place] [phone]   [phone]  PROGRESS NOTE  Patient Care Team: [name], MD as PCP - General (Internal Medicine) Constance Haw, MD as PCP - Electrophysiology (Cardiology)  Hematological/Oncological History # Invasive Adenocarcinoma of the Colon 09/12/2020: mass noted in distal ascending colon. Pathology consistent with adenocarcinoma.  09/20/2020: office visit with Dr. Dema Severin of colorectal surgery. Plan to proceed with laparoscopic right hemicolectomy pending results of CT C/A/P. 09/21/2020: CT C/A/P performed shows no evidence of metastatic disease. Only noted to have neoplasm in transverse colon.   # Iron Deficiency Anemia 2/2 to GI Bleed 11/13/2006: WBC 8.2, Hgb 10.9, MCV 86.5, Plt 365 03/19/2012: WBC 11.6, Hgb 9.5, MCV 82.5, Plt 339 05/10/2020: WBC 10.3, Hgb 8.7, MCV 81.5, Plt 357 08/11/2020: establish care with Dr. Lorenso Courier. Hgb 10.2, Ferritin 18, TIBC 568 12/22/2020: WBC 8.0, Hgb 11.8, MCV 87.3, Plt 275  Interval History:  Diana Wood 87 y.o. female with medical history significant for  adenocarcinoma of the colon s/p resection and iron deficiency anemia who presents for a follow up visit. The patient's last visit was on 09/22/2020. In the interim since the last visit she underwent a surgical resection of the adenocarcinoma of the colon, found to have Stage I disease.  On exam today Diana Wood is accompanied by her daughter.  She reports that she has had a rough time with her health over the last 3 months.  She notes that she contracted the flu as well as COVID-19.  Her energy level still have not recovered from these infections.  She does spend a lot of the time in bed and sleeps about 18 hours/day according to her husband.  She was prescribed iron pills which she should have been taking for the last several months but she has been refusing to take this as she believed it was causing diarrhea.  She reports that more recently she has become  serious about taking her iron pills and has been trying to take them every day.  She does enjoy hamburgers but does not eat them very often.  She does not like spinach or greens.  She denies any shortness of breath but has been having issues with fatigue trying to walk from the kitchen to the bedroom.  She otherwise denies any fevers, chills, sweats, nausea, vomiting or diarrhea.  Full 10 point ROS is listed below.  MEDICAL HISTORY:  Past Medical History:  Diagnosis Date   Anemia    iron infusions   Arthritis    CAD (coronary artery disease)    a. s/p CABG 2008.   Chest pain    Deep vein thrombosis (HCC)    Diabetes mellitus    GERD (gastroesophageal reflux disease)    Hiatal hernia    Hypercholesterolemia    Hyperlipidemia    Hypertension    Hypothyroid    Obesity    Pleural effusion    Uterine cancer (HCC)     SURGICAL HISTORY: Past Surgical History:  Procedure Laterality Date   ABDOMINAL HYSTERECTOMY     bilateral feet surgery     CATARACT EXTRACTION, BILATERAL     CORONARY ARTERY BYPASS GRAFT     x 4-11'08-Dr. Bartle([facility])   endoscopic vein harvesting from both thighs     Gilford Raid MD   EP IMPLANTABLE DEVICE N/A 08/10/2015   Procedure: Pacemaker Implant;  Surgeon: Will Meredith Leeds, MD;  Location: [facility];  Service: Cardiovascular;  Laterality: N/A;   excision cyst debridement fusion  distal interphalangeal joint ring finger  with Mini Acutrak screw 20 mm in length     EYE SURGERY     corneal transplant -right   hysterectomy  01/07/1998   dx of uterine cancer   JOINT REPLACEMENT     right knee replacement-Dr. supple   LAPAROSCOPIC RIGHT HEMI COLECTOMY Right 10/13/2020   Procedure: LAPAROSCOPIC RIGHT HEMI COLECTOMY;  Surgeon: Ileana Roup, MD;  Location: [facility];  Service: General;  Laterality: Right;   surgery on 2 fingers     r HAND   TOTAL KNEE ARTHROPLASTY Left 03/16/2012   Procedure: TOTAL KNEE ARTHROPLASTY;  Surgeon: Gearlean Alf, MD;   Location: [facility];  Service: Orthopedics;  Laterality: Left;    SOCIAL HISTORY: Social History   Socioeconomic History   Marital status: Married    Spouse name: Not on file   Number of children: 3   Years of education: Not on file   Highest education level: Not on file  Occupational History   Occupation: retired    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  Tobacco Use   Smoking status: Never   Smokeless tobacco: Never  Vaping Use   Vaping Use: Never used  Substance and Sexual Activity   Alcohol use: No   Drug use: No   Sexual activity: Yes  Other Topics Concern   Not on file  Social History Narrative   Not on file   Social Determinants of Health   Financial Resource Strain: Not on file  Food Insecurity: Not on file  Transportation Needs: Not on file  Physical Activity: Not on file  Stress: Not on file  Social Connections: Not on file  Intimate Partner Violence: Not on file    FAMILY HISTORY: Family History  Problem Relation Age of Onset   Cancer Mother        type unknown   Diabetes Mother    Blindness Brother        partial from menigitis   Hyperlipidemia Daughter    Thyroid disease Daughter    Colon cancer Neg Hx     ALLERGIES:  is allergic to ace inhibitors, statins, tramadol hcl, losartan, and tape.  MEDICATIONS:  Current Outpatient Medications  Medication Sig Dispense Refill   Accu-Chek Softclix Lancets lancets 1 each by Other route as needed.     amLODipine (NORVASC) 10 MG tablet Take 10 mg by mouth daily.     Cholecalciferol (VITAMIN D) 50 MCG (2000 UT) CAPS Take 2,000 Units by mouth daily.     cyanocobalamin (,VITAMIN B-12,) 1000 MCG/ML injection INJECT 1 ML (1,000 MCG TOTAL) INTO THE MUSCLE ONCE FOR 1 DOSE. 3 mL 1   fenofibrate (TRICOR) 145 MG tablet Take 145 mg by mouth daily after breakfast.     ferrous sulfate 325 (65 FE) MG tablet Take 325 mg by mouth daily.     hydrALAZINE (APRESOLINE) 25 MG tablet Take 50 mg by mouth in the morning and at  bedtime.     hydrochlorothiazide (HYDRODIURIL) 12.5 MG tablet Take 12.5 mg by mouth daily after breakfast.      levothyroxine (SYNTHROID) 125 MCG tablet Take 125 mcg by mouth daily before breakfast.     memantine (NAMENDA) 5 MG tablet Take 5 mg by mouth daily.     metFORMIN (GLUCOPHAGE) 500 MG tablet Take 500 mg by mouth daily.     nitroGLYCERIN (NITROSTAT) 0.4 MG SL tablet Place 1 tablet (0.4 mg total) under the tongue every 5 (five) minutes as  needed for chest pain. 25 tablet 3   Omega-3 Fatty Acids (FISH OIL PO) Take 1 tablet by mouth daily.     omeprazole (PRILOSEC) 20 MG capsule Take 20 mg by mouth daily after breakfast.     oseltamivir (TAMIFLU) 75 MG capsule Take 1 capsule (75 mg total) by mouth every 12 (twelve) hours. 10 capsule 0   prednisoLONE acetate (PRED FORTE) 1 % ophthalmic suspension Place 1 drop into both eyes every morning.     sertraline (ZOLOFT) 100 MG tablet Take 100 mg by mouth daily.     TRUE METRIX BLOOD GLUCOSE TEST test strip 1 each by Other route daily.     No current facility-administered medications for this visit.    REVIEW OF SYSTEMS:   Constitutional: ( - ) fevers, ( - )  chills , ( - ) night sweats Eyes: ( - ) blurriness of vision, ( - ) double vision, ( - ) watery eyes Ears, nose, mouth, throat, and face: ( - ) mucositis, ( - ) sore throat Respiratory: ( - ) cough, ( - ) dyspnea, ( - ) wheezes Cardiovascular: ( - ) palpitation, ( - ) chest discomfort, ( - ) lower extremity swelling Gastrointestinal:  ( - ) nausea, ( - ) heartburn, ( - ) change in bowel habits Skin: ( - ) abnormal skin rashes Lymphatics: ( - ) new lymphadenopathy, ( - ) easy bruising Neurological: ( - ) numbness, ( - ) tingling, ( - ) new weaknesses Behavioral/Psych: ( - ) mood change, ( - ) new changes  All other systems were reviewed with the patient and are negative.  PHYSICAL EXAMINATION: ECOG PERFORMANCE STATUS: 1 - Symptomatic but completely ambulatory  Vitals:   12/22/20 0906   BP: (!) 145/78  Pulse: 72  Resp: 17  Temp: 98.2 F (36.8 C)  SpO2: 99%    Filed Weights   12/22/20 0906  Weight: 118 lb 1.6 oz (53.6 kg)     GENERAL: Well-appearing elderly Caucasian female, alert, no distress and comfortable SKIN: skin color, texture, turgor are normal, no rashes or significant lesions EYES: conjunctiva are pink and non-injected, sclera clear LUNGS: clear to auscultation and percussion with normal breathing effort HEART: regular rate & rhythm and no murmurs and no lower extremity edema Musculoskeletal: no cyanosis of digits and no clubbing  PSYCH: alert & oriented x 3, fluent speech NEURO: no focal motor/sensory deficits  LABORATORY DATA:  I have reviewed the data as listed CBC Latest Ref Rng & Units 12/22/2020 12/04/2020 10/15/2020  WBC 4.0 - 10.5 K/uL 8.0 11.7(H) 12.3(H)  Hemoglobin 12.0 - 15.0 g/dL 11.8(L) 12.3 10.5(L)  Hematocrit 36.0 - 46.0 % 35.8(L) 37.6 33.3(L)  Platelets 150 - 400 K/uL 275 280 301    CMP Latest Ref Rng & Units 12/22/2020 12/04/2020 10/15/2020  Glucose 70 - 99 mg/dL 104(H) 101(H) 112(H)  BUN 8 - 23 mg/dL 27(H) 21 13  Creatinine 0.44 - 1.00 mg/dL 1.01(H) 0.96 0.93  Sodium 135 - 145 mmol/L 141 133(L) 136  Potassium 3.5 - 5.1 mmol/L 3.9 3.2(L) 4.6  Chloride 98 - 111 mmol/L 107 96(L) 106  CO2 22 - 32 mmol/L 24 24 22   Calcium 8.9 - 10.3 mg/dL 9.5 10.0 9.4  Total Protein 6.5 - 8.1 g/dL 7.1 6.9 -  Total Bilirubin 0.3 - 1.2 mg/dL 0.4 0.6 -  Alkaline Phos 38 - 126 U/L 63 39 -  AST 15 - 41 U/L 47(H) 100(H) -  ALT 0 - 44 U/L 26  33 -    No results found for: MPROTEIN No results found for: KPAFRELGTCHN, LAMBDASER, KAPLAMBRATIO   Pathology:    RADIOGRAPHIC STUDIES: I have personally reviewed the radiological images as listed and agreed with the findings in the report: No overt signs of metastatic disease outside the colon. No results found.  ASSESSMENT & PLAN Diana Wood 87 y.o. female with medical history significant for  newly diagnosed adenocarcinoma of the colon who presents for a follow up visit.   #Adenocarcinoma of the Colon, Stage I ( pT2, pN0, M0)  -- CT chest abdomen pelvis performed on 09/21/2020 shows no evidence of metastatic disease.  Appears to be a single lesion in the transverse colon. --s/p resection of the tumor on 10/13/2020, pathology confirms stage I disease. --recommend f/u colonoscopy in 1 years time --no need for routine imaging or tumor markers in blood.  --continue to follow for iron deficiency.  # Iron Deficiency Anemia 2/2 to GI Bleeding -- Patient has been on p.o. iron therapy but had to stop due to her diarrhea.  We requested that she restart the therapy today. --on 8/19 and 8/26 the patient received IV Feraheme 510 mg q. 7 days x 2 doses --Etiology of her iron deficiency is likely the adenocarcinoma of the colon. Can consider alternative etiologies if her symptoms persist.  --Hgb 11.8 today, down from 12.3 on 12/04/2020.  --RTC in 4 months   No orders of the defined types were placed in this encounter.   All questions were answered. The patient knows to call the clinic with any problems, questions or concerns.  A total of more than 30 minutes were spent on this encounter with face-to-face time and non-face-to-face time, including preparing to see the patient, ordering tests and/or medications, counseling the patient and coordination of care as outlined above.   Ledell Peoples, MD Department of Hematology/Oncology [place] [facility] Phone: [phone] Pager: [phone] Email: [email or web address]  12/22/2020 9:51 AM

## 2020-12-29 ENCOUNTER — Telehealth: Payer: Self-pay | Admitting: Hematology and Oncology

## 2020-12-29 NOTE — Telephone Encounter (Signed)
Scheduled per sch msg. Called and left msg  

## 2021-01-06 DIAGNOSIS — E1021 Type 1 diabetes mellitus with diabetic nephropathy: Secondary | ICD-10-CM | POA: Diagnosis not present

## 2021-01-14 DIAGNOSIS — Z20822 Contact with and (suspected) exposure to covid-19: Secondary | ICD-10-CM | POA: Diagnosis not present

## 2021-02-06 DIAGNOSIS — E1021 Type 1 diabetes mellitus with diabetic nephropathy: Secondary | ICD-10-CM | POA: Diagnosis not present

## 2021-02-20 ENCOUNTER — Ambulatory Visit (INDEPENDENT_AMBULATORY_CARE_PROVIDER_SITE_OTHER): Payer: Medicare Other

## 2021-02-20 DIAGNOSIS — I442 Atrioventricular block, complete: Secondary | ICD-10-CM

## 2021-02-20 LAB — CUP PACEART REMOTE DEVICE CHECK
Battery Remaining Longevity: 44 mo
Battery Remaining Percentage: 40 %
Battery Voltage: 2.96 V
Brady Statistic AP VP Percent: 40 %
Brady Statistic AP VS Percent: 1 %
Brady Statistic AS VP Percent: 60 %
Brady Statistic AS VS Percent: 1 %
Brady Statistic RA Percent Paced: 39 %
Brady Statistic RV Percent Paced: 99 %
Date Time Interrogation Session: 20230214020013
Implantable Lead Implant Date: 20170803
Implantable Lead Implant Date: 20170803
Implantable Lead Location: 753859
Implantable Lead Location: 753860
Implantable Pulse Generator Implant Date: 20170803
Lead Channel Impedance Value: 360 Ohm
Lead Channel Impedance Value: 380 Ohm
Lead Channel Pacing Threshold Amplitude: 0.5 V
Lead Channel Pacing Threshold Amplitude: 0.75 V
Lead Channel Pacing Threshold Pulse Width: 0.5 ms
Lead Channel Pacing Threshold Pulse Width: 0.5 ms
Lead Channel Sensing Intrinsic Amplitude: 1.4 mV
Lead Channel Sensing Intrinsic Amplitude: 4 mV
Lead Channel Setting Pacing Amplitude: 1 V
Lead Channel Setting Pacing Amplitude: 2 V
Lead Channel Setting Pacing Pulse Width: 0.5 ms
Lead Channel Setting Sensing Sensitivity: 2 mV
Pulse Gen Model: 2272
Pulse Gen Serial Number: 7929478

## 2021-02-23 NOTE — Progress Notes (Signed)
Remote pacemaker transmission.   

## 2021-03-01 ENCOUNTER — Other Ambulatory Visit (HOSPITAL_COMMUNITY): Payer: Self-pay

## 2021-03-05 DIAGNOSIS — Z20822 Contact with and (suspected) exposure to covid-19: Secondary | ICD-10-CM | POA: Diagnosis not present

## 2021-03-06 DIAGNOSIS — E1021 Type 1 diabetes mellitus with diabetic nephropathy: Secondary | ICD-10-CM | POA: Diagnosis not present

## 2021-03-07 DIAGNOSIS — Z20822 Contact with and (suspected) exposure to covid-19: Secondary | ICD-10-CM | POA: Diagnosis not present

## 2021-03-29 ENCOUNTER — Telehealth: Payer: Self-pay | Admitting: Hematology and Oncology

## 2021-03-29 NOTE — Telephone Encounter (Signed)
.  Called patient to schedule appointment per 3/23 inbasket , patient daughter  is aware of date and time.   ?

## 2021-04-20 DIAGNOSIS — E785 Hyperlipidemia, unspecified: Secondary | ICD-10-CM | POA: Diagnosis not present

## 2021-04-20 DIAGNOSIS — E039 Hypothyroidism, unspecified: Secondary | ICD-10-CM | POA: Diagnosis not present

## 2021-04-20 DIAGNOSIS — E559 Vitamin D deficiency, unspecified: Secondary | ICD-10-CM | POA: Diagnosis not present

## 2021-04-20 DIAGNOSIS — I119 Hypertensive heart disease without heart failure: Secondary | ICD-10-CM | POA: Diagnosis not present

## 2021-04-20 DIAGNOSIS — E119 Type 2 diabetes mellitus without complications: Secondary | ICD-10-CM | POA: Diagnosis not present

## 2021-04-23 ENCOUNTER — Ambulatory Visit: Payer: Medicare Other | Admitting: Hematology and Oncology

## 2021-04-23 ENCOUNTER — Other Ambulatory Visit: Payer: Medicare Other

## 2021-04-23 DIAGNOSIS — L570 Actinic keratosis: Secondary | ICD-10-CM | POA: Diagnosis not present

## 2021-04-23 DIAGNOSIS — D485 Neoplasm of uncertain behavior of skin: Secondary | ICD-10-CM | POA: Diagnosis not present

## 2021-04-23 DIAGNOSIS — C44622 Squamous cell carcinoma of skin of right upper limb, including shoulder: Secondary | ICD-10-CM | POA: Diagnosis not present

## 2021-04-27 ENCOUNTER — Inpatient Hospital Stay: Payer: Medicare Other | Attending: Hematology and Oncology

## 2021-04-27 ENCOUNTER — Inpatient Hospital Stay (HOSPITAL_BASED_OUTPATIENT_CLINIC_OR_DEPARTMENT_OTHER): Payer: Medicare Other | Admitting: Hematology and Oncology

## 2021-04-27 ENCOUNTER — Other Ambulatory Visit: Payer: Self-pay

## 2021-04-27 ENCOUNTER — Other Ambulatory Visit: Payer: Self-pay | Admitting: Hematology and Oncology

## 2021-04-27 VITALS — BP 151/67 | HR 54 | Temp 97.5°F | Resp 18 | Wt 122.2 lb

## 2021-04-27 DIAGNOSIS — K922 Gastrointestinal hemorrhage, unspecified: Secondary | ICD-10-CM | POA: Diagnosis not present

## 2021-04-27 DIAGNOSIS — Z0000 Encounter for general adult medical examination without abnormal findings: Secondary | ICD-10-CM | POA: Diagnosis not present

## 2021-04-27 DIAGNOSIS — E119 Type 2 diabetes mellitus without complications: Secondary | ICD-10-CM | POA: Diagnosis not present

## 2021-04-27 DIAGNOSIS — Z79899 Other long term (current) drug therapy: Secondary | ICD-10-CM | POA: Insufficient documentation

## 2021-04-27 DIAGNOSIS — D500 Iron deficiency anemia secondary to blood loss (chronic): Secondary | ICD-10-CM

## 2021-04-27 DIAGNOSIS — I119 Hypertensive heart disease without heart failure: Secondary | ICD-10-CM | POA: Diagnosis not present

## 2021-04-27 DIAGNOSIS — Z833 Family history of diabetes mellitus: Secondary | ICD-10-CM | POA: Diagnosis not present

## 2021-04-27 DIAGNOSIS — D692 Other nonthrombocytopenic purpura: Secondary | ICD-10-CM | POA: Diagnosis not present

## 2021-04-27 DIAGNOSIS — C189 Malignant neoplasm of colon, unspecified: Secondary | ICD-10-CM

## 2021-04-27 DIAGNOSIS — I2510 Atherosclerotic heart disease of native coronary artery without angina pectoris: Secondary | ICD-10-CM | POA: Diagnosis not present

## 2021-04-27 DIAGNOSIS — Z8542 Personal history of malignant neoplasm of other parts of uterus: Secondary | ICD-10-CM | POA: Diagnosis not present

## 2021-04-27 DIAGNOSIS — Z821 Family history of blindness and visual loss: Secondary | ICD-10-CM | POA: Insufficient documentation

## 2021-04-27 DIAGNOSIS — Z809 Family history of malignant neoplasm, unspecified: Secondary | ICD-10-CM | POA: Diagnosis not present

## 2021-04-27 DIAGNOSIS — R269 Unspecified abnormalities of gait and mobility: Secondary | ICD-10-CM | POA: Diagnosis not present

## 2021-04-27 DIAGNOSIS — D539 Nutritional anemia, unspecified: Secondary | ICD-10-CM | POA: Diagnosis not present

## 2021-04-27 DIAGNOSIS — D509 Iron deficiency anemia, unspecified: Secondary | ICD-10-CM | POA: Diagnosis not present

## 2021-04-27 DIAGNOSIS — R82998 Other abnormal findings in urine: Secondary | ICD-10-CM | POA: Diagnosis not present

## 2021-04-27 DIAGNOSIS — C182 Malignant neoplasm of ascending colon: Secondary | ICD-10-CM | POA: Diagnosis not present

## 2021-04-27 DIAGNOSIS — Z8349 Family history of other endocrine, nutritional and metabolic diseases: Secondary | ICD-10-CM | POA: Insufficient documentation

## 2021-04-27 DIAGNOSIS — E039 Hypothyroidism, unspecified: Secondary | ICD-10-CM | POA: Diagnosis not present

## 2021-04-27 DIAGNOSIS — Z1339 Encounter for screening examination for other mental health and behavioral disorders: Secondary | ICD-10-CM | POA: Diagnosis not present

## 2021-04-27 DIAGNOSIS — Z1331 Encounter for screening for depression: Secondary | ICD-10-CM | POA: Diagnosis not present

## 2021-04-27 DIAGNOSIS — E785 Hyperlipidemia, unspecified: Secondary | ICD-10-CM | POA: Diagnosis not present

## 2021-04-27 LAB — CBC WITH DIFFERENTIAL (CANCER CENTER ONLY)
Abs Immature Granulocytes: 0.02 10*3/uL (ref 0.00–0.07)
Basophils Absolute: 0 10*3/uL (ref 0.0–0.1)
Basophils Relative: 1 %
Eosinophils Absolute: 0.1 10*3/uL (ref 0.0–0.5)
Eosinophils Relative: 1 %
HCT: 36.6 % (ref 36.0–46.0)
Hemoglobin: 11.8 g/dL — ABNORMAL LOW (ref 12.0–15.0)
Immature Granulocytes: 0 %
Lymphocytes Relative: 46 %
Lymphs Abs: 3.8 10*3/uL (ref 0.7–4.0)
MCH: 29.1 pg (ref 26.0–34.0)
MCHC: 32.2 g/dL (ref 30.0–36.0)
MCV: 90.1 fL (ref 80.0–100.0)
Monocytes Absolute: 0.7 10*3/uL (ref 0.1–1.0)
Monocytes Relative: 8 %
Neutro Abs: 3.6 10*3/uL (ref 1.7–7.7)
Neutrophils Relative %: 44 %
Platelet Count: 317 10*3/uL (ref 150–400)
RBC: 4.06 MIL/uL (ref 3.87–5.11)
RDW: 14.8 % (ref 11.5–15.5)
WBC Count: 8.2 10*3/uL (ref 4.0–10.5)
nRBC: 0 % (ref 0.0–0.2)

## 2021-04-27 LAB — CMP (CANCER CENTER ONLY)
ALT: 17 U/L (ref 0–44)
AST: 31 U/L (ref 15–41)
Albumin: 4.1 g/dL (ref 3.5–5.0)
Alkaline Phosphatase: 45 U/L (ref 38–126)
Anion gap: 6 (ref 5–15)
BUN: 24 mg/dL — ABNORMAL HIGH (ref 8–23)
CO2: 30 mmol/L (ref 22–32)
Calcium: 9.8 mg/dL (ref 8.9–10.3)
Chloride: 105 mmol/L (ref 98–111)
Creatinine: 1.08 mg/dL — ABNORMAL HIGH (ref 0.44–1.00)
GFR, Estimated: 50 mL/min — ABNORMAL LOW (ref >60)
Glucose, Bld: 125 mg/dL — ABNORMAL HIGH (ref 70–99)
Potassium: 3.6 mmol/L (ref 3.5–5.1)
Sodium: 141 mmol/L (ref 135–145)
Total Bilirubin: 0.5 mg/dL (ref 0.3–1.2)
Total Protein: 7.3 g/dL (ref 6.5–8.1)

## 2021-04-27 LAB — RETIC PANEL
Immature Retic Fract: 8.2 % (ref 2.3–15.9)
RBC.: 4.04 MIL/uL (ref 3.87–5.11)
Retic Count, Absolute: 49.7 10*3/uL (ref 19.0–186.0)
Retic Ct Pct: 1.2 % (ref 0.4–3.1)
Reticulocyte Hemoglobin: 32.9 pg (ref >27.9)

## 2021-04-27 LAB — FERRITIN: Ferritin: 141 ng/mL (ref 11–307)

## 2021-04-27 LAB — IRON AND IRON BINDING CAPACITY (CC-WL,HP ONLY)
Iron: 89 ug/dL (ref 28–170)
Saturation Ratios: 19 % (ref 10.4–31.8)
TIBC: 477 ug/dL — ABNORMAL HIGH (ref 250–450)
UIBC: 388 ug/dL (ref 148–442)

## 2021-04-27 NOTE — Progress Notes (Signed)
?[place] ?Telephone:([phone]   [phone] ? ?PROGRESS NOTE ? ?Patient Care Team: ?[name], MD as PCP - General (Internal Medicine) ?Constance Haw, MD as PCP - Electrophysiology (Cardiology) ? ?Hematological/Oncological History ?# Invasive Adenocarcinoma of the Colon ?09/12/2020: mass noted in distal ascending colon. Pathology consistent with adenocarcinoma.  ?09/20/2020: office visit with Dr. Dema Severin of colorectal surgery. Plan to proceed with laparoscopic right hemicolectomy pending results of CT C/A/P. ?09/21/2020: CT C/A/P performed shows no evidence of metastatic disease. Only noted to have neoplasm in transverse colon.  ? ?# Iron Deficiency Anemia 2/2 to GI Bleed ?11/13/2006: WBC 8.2, Hgb 10.9, MCV 86.5, Plt 365 ?03/19/2012: WBC 11.6, Hgb 9.5, MCV 82.5, Plt 339 ?05/10/2020: WBC 10.3, Hgb 8.7, MCV 81.5, Plt 357 ?08/11/2020: establish care with Dr. Lorenso Courier. Hgb 10.2, Ferritin 18, TIBC 568 ?12/22/2020: WBC 8.0, Hgb 11.8, MCV 87.3, Plt 275 ? ?Interval History:  ?Diana Wood 88 y.o. female with medical history significant for  adenocarcinoma of the colon s/p resection and iron deficiency anemia who presents for a follow up visit. The patient's last visit was on 12/22/2020. In the interim since the last visit she underwent a surgical resection of the adenocarcinoma of the colon, found to have Stage I disease. ? ?On exam today Diana Wood reports that she has low energy and sometimes she has "not at all".  She notes that she feels slightly worse than the last time I saw her.  Unfortunately she has not been able to take her iron pills because worsening diarrhea.  She notes she also does not eat much in the way of red meat.  Fortunately she is not having any issues with shortness of breath and denies any bleeding elsewhere.  She reports that she has had iron infusions performed she would live to have them performed on Friday. She otherwise denies any fevers, chills, sweats, nausea, vomiting  or diarrhea.  Full 10 point ROS is listed below. ? ?MEDICAL HISTORY:  ?Past Medical History:  ?Diagnosis Date  ? Anemia   ? iron infusions  ? Arthritis   ? CAD (coronary artery disease)   ? a. s/p CABG 2008.  ? Chest pain   ? Deep vein thrombosis ([place])   ? Diabetes mellitus   ? GERD (gastroesophageal reflux disease)   ? Hiatal hernia   ? Hypercholesterolemia   ? Hyperlipidemia   ? Hypertension   ? Hypothyroid   ? Obesity   ? Pleural effusion   ? Uterine cancer ([place])   ? ? ?SURGICAL HISTORY: ?Past Surgical History:  ?Procedure Laterality Date  ? ABDOMINAL HYSTERECTOMY    ? bilateral feet surgery    ? CATARACT EXTRACTION, BILATERAL    ? CORONARY ARTERY BYPASS GRAFT    ? x 4-11'08-Dr. Bartle([facility])  ? endoscopic vein harvesting from both thighs    ? Gilford Raid MD  ? EP IMPLANTABLE DEVICE N/A 08/10/2015  ? Procedure: Pacemaker Implant;  Surgeon: Will Meredith Leeds, MD;  Location: [facility];  Service: Cardiovascular;  Laterality: N/A;  ? excision cyst debridement fusion distal interphalangeal joint ring finger  with Mini Acutrak screw 20 mm in length    ? EYE SURGERY    ? corneal transplant -right  ? hysterectomy  01/07/1998  ? dx of uterine cancer  ? JOINT REPLACEMENT    ? right knee replacement-Dr. supple  ? LAPAROSCOPIC RIGHT HEMI COLECTOMY Right 10/13/2020  ? Procedure: LAPAROSCOPIC RIGHT HEMI COLECTOMY;  Surgeon: Ileana Roup, MD;  Location: [facility];  Service: General;  Laterality: Right;  ? surgery on 2 fingers    ? r HAND  ? TOTAL KNEE ARTHROPLASTY Left 03/16/2012  ? Procedure: TOTAL KNEE ARTHROPLASTY;  Surgeon: Gearlean Alf, MD;  Location: [facility];  Service: Orthopedics;  Laterality: Left;  ? ? ?SOCIAL HISTORY: ?Social History  ? ?Socioeconomic History  ? Marital status: Married  ?  Spouse name: Not on file  ? Number of children: 3  ? Years of education: Not on file  ? Highest education level: Not on file  ?Occupational History  ? Occupation: retired  ?  Employer: RETIRED  ?  Comment: Network engineer   ?Tobacco Use  ? Smoking status: Never  ? Smokeless tobacco: Never  ?Vaping Use  ? Vaping Use: Never used  ?Substance and Sexual Activity  ? Alcohol use: No  ? Drug use: No  ? Sexual activity: Yes  ?Other Topics Concern  ? Not on file  ?Social History Narrative  ? Not on file  ? ?Social Determinants of Health  ? ?Financial Resource Strain: Not on file  ?Food Insecurity: Not on file  ?Transportation Needs: Not on file  ?Physical Activity: Not on file  ?Stress: Not on file  ?Social Connections: Not on file  ?Intimate Partner Violence: Not on file  ? ? ?FAMILY HISTORY: ?Family History  ?Problem Relation Age of Onset  ? Cancer Mother   ?     type unknown  ? Diabetes Mother   ? Blindness Brother   ?     partial from menigitis  ? Hyperlipidemia Daughter   ? Thyroid disease Daughter   ? Colon cancer Neg Hx   ? ? ?ALLERGIES:  is allergic to ace inhibitors, statins, tramadol hcl, losartan, and tape. ? ?MEDICATIONS:  ?Current Outpatient Medications  ?Medication Sig Dispense Refill  ? Accu-Chek Softclix Lancets lancets 1 each by Other route as needed.    ? amLODipine (NORVASC) 10 MG tablet Take 10 mg by mouth daily.    ? Cholecalciferol (VITAMIN D) 50 MCG (2000 UT) CAPS Take 2,000 Units by mouth daily.    ? cyanocobalamin (,VITAMIN B-12,) 1000 MCG/ML injection INJECT 1 ML (1,000 MCG TOTAL) INTO THE MUSCLE ONCE FOR 1 DOSE. 3 mL 1  ? fenofibrate (TRICOR) 145 MG tablet Take 145 mg by mouth daily after breakfast.    ? ferrous sulfate 325 (65 FE) MG tablet Take 325 mg by mouth daily.    ? hydrALAZINE (APRESOLINE) 25 MG tablet Take 50 mg by mouth in the morning and at bedtime.    ? hydrochlorothiazide (HYDRODIURIL) 12.5 MG tablet Take 12.5 mg by mouth daily after breakfast.     ? levothyroxine (SYNTHROID) 125 MCG tablet Take 125 mcg by mouth daily before breakfast.    ? memantine (NAMENDA) 5 MG tablet Take 5 mg by mouth daily.    ? metFORMIN (GLUCOPHAGE) 500 MG tablet Take 500 mg by mouth daily.    ? nitroGLYCERIN (NITROSTAT) 0.4 MG  SL tablet Place 1 tablet (0.4 mg total) under the tongue every 5 (five) minutes as needed for chest pain. 25 tablet 3  ? Omega-3 Fatty Acids (FISH OIL PO) Take 1 tablet by mouth daily.    ? omeprazole (PRILOSEC) 20 MG capsule Take 20 mg by mouth daily after breakfast.    ? oseltamivir (TAMIFLU) 75 MG capsule Take 1 capsule (75 mg total) by mouth every 12 (twelve) hours. 10 capsule 0  ? prednisoLONE acetate (PRED FORTE) 1 % ophthalmic suspension  Place 1 drop into both eyes every morning.    ? sertraline (ZOLOFT) 100 MG tablet Take 100 mg by mouth daily.    ? TRUE METRIX BLOOD GLUCOSE TEST test strip 1 each by Other route daily.    ? ?No current facility-administered medications for this visit.  ? ? ?REVIEW OF SYSTEMS:   ?Constitutional: ( - ) fevers, ( - )  chills , ( - ) night sweats ?Eyes: ( - ) blurriness of vision, ( - ) double vision, ( - ) watery eyes ?Ears, nose, mouth, throat, and face: ( - ) mucositis, ( - ) sore throat ?Respiratory: ( - ) cough, ( - ) dyspnea, ( - ) wheezes ?Cardiovascular: ( - ) palpitation, ( - ) chest discomfort, ( - ) lower extremity swelling ?Gastrointestinal:  ( - ) nausea, ( - ) heartburn, ( - ) change in bowel habits ?Skin: ( - ) abnormal skin rashes ?Lymphatics: ( - ) new lymphadenopathy, ( - ) easy bruising ?Neurological: ( - ) numbness, ( - ) tingling, ( - ) new weaknesses ?Behavioral/Psych: ( - ) mood change, ( - ) new changes  ?All other systems were reviewed with the patient and are negative. ? ?PHYSICAL EXAMINATION: ?ECOG PERFORMANCE STATUS: 1 - Symptomatic but completely ambulatory ? ?Vitals:  ? 04/27/21 1315  ?BP: (!) 151/67  ?Pulse: (!) 54  ?Resp: 18  ?Temp: (!) 97.5 ?F (36.4 ?C)  ?SpO2: 100%  ? ? ? ?Filed Weights  ? 04/27/21 1315  ?Weight: 122 lb 3.2 oz (55.4 kg)  ? ? ? ? ?GENERAL: Well-appearing elderly Caucasian female, alert, no distress and comfortable ?SKIN: skin color, texture, turgor are normal, no rashes or significant lesions ?EYES: conjunctiva are pink and  non-injected, sclera clear ?LUNGS: clear to auscultation and percussion with normal breathing effort ?HEART: regular rate & rhythm and no murmurs and no lower extremity edema ?Musculoskeletal: no cyanos

## 2021-04-30 ENCOUNTER — Telehealth: Payer: Self-pay | Admitting: Hematology and Oncology

## 2021-04-30 DIAGNOSIS — Z20822 Contact with and (suspected) exposure to covid-19: Secondary | ICD-10-CM | POA: Diagnosis not present

## 2021-04-30 NOTE — Telephone Encounter (Signed)
Scheduled per 4/21 los, message has been left with pt ?

## 2021-05-02 DIAGNOSIS — H33101 Unspecified retinoschisis, right eye: Secondary | ICD-10-CM | POA: Diagnosis not present

## 2021-05-02 DIAGNOSIS — H353131 Nonexudative age-related macular degeneration, bilateral, early dry stage: Secondary | ICD-10-CM | POA: Diagnosis not present

## 2021-05-02 DIAGNOSIS — Z9849 Cataract extraction status, unspecified eye: Secondary | ICD-10-CM | POA: Diagnosis not present

## 2021-05-02 DIAGNOSIS — E119 Type 2 diabetes mellitus without complications: Secondary | ICD-10-CM | POA: Diagnosis not present

## 2021-05-02 DIAGNOSIS — Z961 Presence of intraocular lens: Secondary | ICD-10-CM | POA: Diagnosis not present

## 2021-05-02 DIAGNOSIS — H5212 Myopia, left eye: Secondary | ICD-10-CM | POA: Diagnosis not present

## 2021-05-02 DIAGNOSIS — H35033 Hypertensive retinopathy, bilateral: Secondary | ICD-10-CM | POA: Diagnosis not present

## 2021-05-02 DIAGNOSIS — H3530 Unspecified macular degeneration: Secondary | ICD-10-CM | POA: Diagnosis not present

## 2021-05-02 DIAGNOSIS — H5201 Hypermetropia, right eye: Secondary | ICD-10-CM | POA: Diagnosis not present

## 2021-05-02 DIAGNOSIS — H52223 Regular astigmatism, bilateral: Secondary | ICD-10-CM | POA: Diagnosis not present

## 2021-05-02 DIAGNOSIS — H524 Presbyopia: Secondary | ICD-10-CM | POA: Diagnosis not present

## 2021-05-02 DIAGNOSIS — I10 Essential (primary) hypertension: Secondary | ICD-10-CM | POA: Diagnosis not present

## 2021-05-03 ENCOUNTER — Telehealth: Payer: Self-pay | Admitting: *Deleted

## 2021-05-03 NOTE — Telephone Encounter (Signed)
-----   Message from Orson Slick, MD sent at 05/03/2021  9:57 AM EDT ----- ?Please let Mrs. Geiman know that her iron levels are steady. Encourage her to continue PO iron therapy (every other day with Vitamin C if easier to tolerate). We will see her back in 3 months time. ?  ? ?----- Message ----- ?From: Interface, Lab In [place] ?Sent: 04/27/2021   1:03 PM EDT ?To: Orson Slick, MD ? ? ?

## 2021-05-03 NOTE — Telephone Encounter (Signed)
TCT patient regarding recent lab results. Spoke with pt's daughter. Advised that her mother's iron level are stable. Dr. Lorenso Courier recommends she continue her oral iron every other day with a source of Vitamin C. She is to return in 3 months. Daughter voiced understanding to all of the above, ?

## 2021-05-06 DIAGNOSIS — E1021 Type 1 diabetes mellitus with diabetic nephropathy: Secondary | ICD-10-CM | POA: Diagnosis not present

## 2021-05-09 DIAGNOSIS — Z20822 Contact with and (suspected) exposure to covid-19: Secondary | ICD-10-CM | POA: Diagnosis not present

## 2021-05-16 DIAGNOSIS — Z20822 Contact with and (suspected) exposure to covid-19: Secondary | ICD-10-CM | POA: Diagnosis not present

## 2021-05-22 ENCOUNTER — Ambulatory Visit (INDEPENDENT_AMBULATORY_CARE_PROVIDER_SITE_OTHER): Payer: Medicare Other

## 2021-05-22 DIAGNOSIS — I442 Atrioventricular block, complete: Secondary | ICD-10-CM | POA: Diagnosis not present

## 2021-05-22 LAB — CUP PACEART REMOTE DEVICE CHECK
Battery Remaining Longevity: 41 mo
Battery Remaining Percentage: 37 %
Battery Voltage: 2.95 V
Brady Statistic AP VP Percent: 42 %
Brady Statistic AP VS Percent: 1 %
Brady Statistic AS VP Percent: 58 %
Brady Statistic AS VS Percent: 1 %
Brady Statistic RA Percent Paced: 41 %
Brady Statistic RV Percent Paced: 99 %
Date Time Interrogation Session: 20230516020019
Implantable Lead Implant Date: 20170803
Implantable Lead Implant Date: 20170803
Implantable Lead Location: 753859
Implantable Lead Location: 753860
Implantable Pulse Generator Implant Date: 20170803
Lead Channel Impedance Value: 360 Ohm
Lead Channel Impedance Value: 380 Ohm
Lead Channel Pacing Threshold Amplitude: 0.5 V
Lead Channel Pacing Threshold Amplitude: 0.625 V
Lead Channel Pacing Threshold Pulse Width: 0.5 ms
Lead Channel Pacing Threshold Pulse Width: 0.5 ms
Lead Channel Sensing Intrinsic Amplitude: 1.8 mV
Lead Channel Sensing Intrinsic Amplitude: 2.2 mV
Lead Channel Setting Pacing Amplitude: 0.875
Lead Channel Setting Pacing Amplitude: 2 V
Lead Channel Setting Pacing Pulse Width: 0.5 ms
Lead Channel Setting Sensing Sensitivity: 2 mV
Pulse Gen Model: 2272
Pulse Gen Serial Number: 7929478

## 2021-06-07 NOTE — Progress Notes (Signed)
Remote pacemaker transmission.   

## 2021-07-02 NOTE — Progress Notes (Signed)
Cardiology Office Note    Date:  07/06/2021  ID:  Diana Wood, Diana Wood 04-Jan-1933, MRN [id number] PCP:  [name], MD  Cardiologist:  Dr. Johnsie Cancel    Chief Complaint: None f/u CAD and AV block   History of Present Illness:   88 y.o. history of CABG 11/2006, HTN, HLD, Hypothyroidism, DVT, GERD and history of uterine cancer Last myovue normal 02/22/16  Developed bradycardia and had a MRI compatible  PPM placed by Dr Curt Bears August 2017 [facility] Nasuti for thyroid and cholesterol labs   Having some stress with her husband who seems to be more short tempered  No cardiac issues. [place] daughter Diana Wood 4 years old now   Seen by Dr Curt Bears normal PPM function by [facility] 05/22/21 reviewed  Having some balance issues and falling   She had right hemicolectomy with Dr Vikki Ports October 2022  for invasive adenocarcinoma  Has lost a lot of weight since surgery    Past Medical History:  Diagnosis Date   Anemia    iron infusions   Arthritis    CAD (coronary artery disease)    a. s/p CABG 2008.   Chest pain    Deep vein thrombosis (HCC)    Diabetes mellitus    GERD (gastroesophageal reflux disease)    Hiatal hernia    Hypercholesterolemia    Hyperlipidemia    Hypertension    Hypothyroid    Obesity    Pleural effusion    Uterine cancer (HCC)     Past Surgical History:  Procedure Laterality Date   ABDOMINAL HYSTERECTOMY     bilateral feet surgery     CATARACT EXTRACTION, BILATERAL     CORONARY ARTERY BYPASS GRAFT     x 4-11'08-Dr. Bartle([facility])   endoscopic vein harvesting from both thighs     Gilford Raid MD   EP IMPLANTABLE DEVICE N/A 08/10/2015   Procedure: Pacemaker Implant;  Surgeon: Will Meredith Leeds, MD;  Location: [facility];  Service: Cardiovascular;  Laterality: N/A;   excision cyst debridement fusion distal interphalangeal joint ring finger  with Mini Acutrak screw 20 mm in length     EYE SURGERY     corneal transplant -right   hysterectomy  01/07/1998   dx of  uterine cancer   JOINT REPLACEMENT     right knee replacement-Dr. supple   LAPAROSCOPIC RIGHT HEMI COLECTOMY Right 10/13/2020   Procedure: LAPAROSCOPIC RIGHT HEMI COLECTOMY;  Surgeon: Ileana Roup, MD;  Location: [facility];  Service: General;  Laterality: Right;   surgery on 2 fingers     r HAND   TOTAL KNEE ARTHROPLASTY Left 03/16/2012   Procedure: TOTAL KNEE ARTHROPLASTY;  Surgeon: Gearlean Alf, MD;  Location: [facility];  Service: Orthopedics;  Laterality: Left;    Current Medications: Current Outpatient Medications  Medication Sig Dispense Refill   Accu-Chek Softclix Lancets lancets 1 each by Other route as needed.     cyanocobalamin (,VITAMIN B-12,) 1000 MCG/ML injection INJECT 1 ML (1,000 MCG TOTAL) INTO THE MUSCLE ONCE FOR 1 DOSE. 3 mL 1   fenofibrate (TRICOR) 145 MG tablet Take 145 mg by mouth daily after breakfast.     hydrALAZINE (APRESOLINE) 25 MG tablet Take 50 mg by mouth in the morning and at bedtime.     hydrochlorothiazide (HYDRODIURIL) 12.5 MG tablet Take 12.5 mg by mouth daily after breakfast.      levothyroxine (SYNTHROID) 125 MCG tablet Take 125 mcg by mouth daily before breakfast.     omeprazole (  PRILOSEC) 20 MG capsule Take 20 mg by mouth daily after breakfast.     sertraline (ZOLOFT) 100 MG tablet Take 100 mg by mouth daily.     TRUE METRIX BLOOD GLUCOSE TEST test strip 1 each by Other route daily.     amLODipine (NORVASC) 10 MG tablet Take 10 mg by mouth daily. (Patient not taking: Reported on 07/06/2021)     Cholecalciferol (VITAMIN D) 50 MCG (2000 UT) CAPS Take 2,000 Units by mouth daily. (Patient not taking: Reported on 07/06/2021)     ferrous sulfate 325 (65 FE) MG tablet Take 325 mg by mouth daily. (Patient not taking: Reported on 07/06/2021)     memantine (NAMENDA) 5 MG tablet Take 5 mg by mouth daily. (Patient not taking: Reported on 07/06/2021)     metFORMIN (GLUCOPHAGE) 500 MG tablet Take 500 mg by mouth daily. (Patient not taking: Reported on 07/06/2021)      nitroGLYCERIN (NITROSTAT) 0.4 MG SL tablet Place 1 tablet (0.4 mg total) under the tongue every 5 (five) minutes as needed for chest pain. (Patient not taking: Reported on 07/06/2021) 25 tablet 3   Omega-3 Fatty Acids (FISH OIL PO) Take 1 tablet by mouth daily. (Patient not taking: Reported on 07/06/2021)     oseltamivir (TAMIFLU) 75 MG capsule Take 1 capsule (75 mg total) by mouth every 12 (twelve) hours. (Patient not taking: Reported on 07/06/2021) 10 capsule 0   prednisoLONE acetate (PRED FORTE) 1 % ophthalmic suspension Place 1 drop into both eyes every morning. (Patient not taking: Reported on 07/06/2021)     No current facility-administered medications for this visit.     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  Tobacco Use   Smoking status: Never   Smokeless tobacco: Never  Vaping Use   Vaping Use: Never used  Substance and Sexual Activity   Alcohol use: No   Drug use: No   Sexual activity: Yes  Other Topics Concern   Not on file  Social History Narrative   Not on file   Social Determinants of Health   Financial Resource Strain: Not on file  Food Insecurity: Not on file  Transportation Needs: Not on file  Physical Activity: Not on file  Stress: Not on file  Social Connections: Not on file     Family History:  The patient's family history includes Blindness in her brother; Cancer in her mother; Diabetes in her mother; Hyperlipidemia in her daughter; Thyroid disease in her daughter.   ROS:   Please see the history of present illness.  All other systems are reviewed and otherwise negative.    PHYSICAL EXAM:   VS:  BP 130/68   Pulse 69   Ht '5\' 1"'$  (1.549  m)   Wt 123 lb (55.8 kg)   SpO2 98%   BMI 23.24 kg/m   BMI: Body mass index is 23.24 kg/m. Affect appropriate Healthy:  appears stated age 78: normal Neck supple with no adenopathy JVP normal no bruits no thyromegaly Lungs clear with no wheezing and good diaphragmatic motion Heart:  S1/S2 no murmur, no rub, gallop or click PPM under left clavicle  PMI normal pacer under right clavicle  Abdomen: benighn, BS positve, no tenderness, no AAA no bruit.  No HSM or HJR post lab hemi colectomy  Distal pulses intact with no bruits No edema Neuro non-focal Skin warm and dry No muscular weakness   Wt Readings from Last 3 Encounters:  07/06/21 123 lb (55.8 kg)  04/27/21 122 lb 3.2 oz (55.4 kg)  12/22/20 118 lb 1.6 oz (53.6 kg)      Studies/Labs Reviewed:   EKG:   08/04/15 CHB narrow complex escape 02/14/16  P synch pacing LBBB  03/05/17 AV paced with LBBB morphology   Recent Labs: 04/27/2021: ALT 17; BUN 24; Creatinine 1.08; Hemoglobin 11.8; Platelet Count 317; Potassium 3.6; Sodium 141   Lipid Panel   Additional studies/ records that were reviewed today include: Summarized above.    ASSESSMENT & PLAN:   1. PPM:  V pacing 100% of time  f/u EP [place] [employer] device MRI compatible  2. CAD s/p CABG - non ischemic myovue 02/22/16 EF 66%  New nitro called in  3. Chol:  Labs followed by Virgina Jock on fibrate not sure why not on statin 4. Thyroid:  Synthroid dose reduced f/u labs with Virgina Jock Lab Results  Component Value Date   TSH 4.252 05/10/2020  5. DM:  Discussed low carb diet.  Target hemoglobin A1c is 6.5 or less.  Continue current medications. 6. Anxiety/Depression: on Zoloft mood seems appropriate  7. Colon Cancer:  post right hemi colectomy f/u Dr Pryor Curia

## 2021-07-06 ENCOUNTER — Ambulatory Visit (INDEPENDENT_AMBULATORY_CARE_PROVIDER_SITE_OTHER): Payer: Medicare Other | Admitting: Cardiovascular Disease

## 2021-07-06 ENCOUNTER — Encounter: Payer: Self-pay | Admitting: Cardiovascular Disease

## 2021-07-06 VITALS — BP 130/68 | HR 69 | Ht 61.0 in | Wt 123.0 lb

## 2021-07-06 DIAGNOSIS — Z950 Presence of cardiac pacemaker: Secondary | ICD-10-CM

## 2021-07-06 DIAGNOSIS — I442 Atrioventricular block, complete: Secondary | ICD-10-CM | POA: Diagnosis not present

## 2021-07-06 DIAGNOSIS — Z951 Presence of aortocoronary bypass graft: Secondary | ICD-10-CM

## 2021-07-06 NOTE — Patient Instructions (Addendum)

## 2021-07-27 ENCOUNTER — Inpatient Hospital Stay: Payer: Medicare Other | Attending: Hematology and Oncology

## 2021-07-27 ENCOUNTER — Other Ambulatory Visit: Payer: Self-pay | Admitting: Hematology and Oncology

## 2021-07-27 ENCOUNTER — Inpatient Hospital Stay (HOSPITAL_BASED_OUTPATIENT_CLINIC_OR_DEPARTMENT_OTHER): Payer: Medicare Other | Admitting: Hematology and Oncology

## 2021-07-27 ENCOUNTER — Other Ambulatory Visit: Payer: Self-pay

## 2021-07-27 VITALS — BP 163/76 | HR 66 | Temp 97.8°F | Resp 19 | Ht 61.0 in | Wt 127.7 lb

## 2021-07-27 DIAGNOSIS — D500 Iron deficiency anemia secondary to blood loss (chronic): Secondary | ICD-10-CM | POA: Insufficient documentation

## 2021-07-27 DIAGNOSIS — C184 Malignant neoplasm of transverse colon: Secondary | ICD-10-CM | POA: Diagnosis not present

## 2021-07-27 DIAGNOSIS — Z833 Family history of diabetes mellitus: Secondary | ICD-10-CM | POA: Insufficient documentation

## 2021-07-27 DIAGNOSIS — C189 Malignant neoplasm of colon, unspecified: Secondary | ICD-10-CM | POA: Diagnosis not present

## 2021-07-27 DIAGNOSIS — K922 Gastrointestinal hemorrhage, unspecified: Secondary | ICD-10-CM | POA: Diagnosis not present

## 2021-07-27 DIAGNOSIS — D539 Nutritional anemia, unspecified: Secondary | ICD-10-CM | POA: Diagnosis not present

## 2021-07-27 DIAGNOSIS — R5383 Other fatigue: Secondary | ICD-10-CM | POA: Insufficient documentation

## 2021-07-27 DIAGNOSIS — Z809 Family history of malignant neoplasm, unspecified: Secondary | ICD-10-CM | POA: Diagnosis not present

## 2021-07-27 DIAGNOSIS — Z79899 Other long term (current) drug therapy: Secondary | ICD-10-CM | POA: Diagnosis not present

## 2021-07-27 DIAGNOSIS — Z821 Family history of blindness and visual loss: Secondary | ICD-10-CM | POA: Diagnosis not present

## 2021-07-27 DIAGNOSIS — Z8349 Family history of other endocrine, nutritional and metabolic diseases: Secondary | ICD-10-CM | POA: Insufficient documentation

## 2021-07-27 LAB — CMP (CANCER CENTER ONLY)
ALT: 14 U/L (ref 0–44)
AST: 26 U/L (ref 15–41)
Albumin: 4.1 g/dL (ref 3.5–5.0)
Alkaline Phosphatase: 36 U/L — ABNORMAL LOW (ref 38–126)
Anion gap: 6 (ref 5–15)
BUN: 24 mg/dL — ABNORMAL HIGH (ref 8–23)
CO2: 28 mmol/L (ref 22–32)
Calcium: 9.7 mg/dL (ref 8.9–10.3)
Chloride: 105 mmol/L (ref 98–111)
Creatinine: 1.02 mg/dL — ABNORMAL HIGH (ref 0.44–1.00)
GFR, Estimated: 53 mL/min — ABNORMAL LOW (ref >60)
Glucose, Bld: 120 mg/dL — ABNORMAL HIGH (ref 70–99)
Potassium: 3.9 mmol/L (ref 3.5–5.1)
Sodium: 139 mmol/L (ref 135–145)
Total Bilirubin: 0.5 mg/dL (ref 0.3–1.2)
Total Protein: 6.8 g/dL (ref 6.5–8.1)

## 2021-07-27 LAB — IRON AND IRON BINDING CAPACITY (CC-WL,HP ONLY)
Iron: 124 ug/dL (ref 28–170)
Saturation Ratios: 26 % (ref 10.4–31.8)
TIBC: 475 ug/dL — ABNORMAL HIGH (ref 250–450)
UIBC: 351 ug/dL (ref 148–442)

## 2021-07-27 LAB — CBC WITH DIFFERENTIAL (CANCER CENTER ONLY)
Abs Immature Granulocytes: 0.02 10*3/uL (ref 0.00–0.07)
Basophils Absolute: 0 10*3/uL (ref 0.0–0.1)
Basophils Relative: 1 %
Eosinophils Absolute: 0.1 10*3/uL (ref 0.0–0.5)
Eosinophils Relative: 1 %
HCT: 35.3 % — ABNORMAL LOW (ref 36.0–46.0)
Hemoglobin: 11.5 g/dL — ABNORMAL LOW (ref 12.0–15.0)
Immature Granulocytes: 0 %
Lymphocytes Relative: 47 %
Lymphs Abs: 3.4 10*3/uL (ref 0.7–4.0)
MCH: 29.7 pg (ref 26.0–34.0)
MCHC: 32.6 g/dL (ref 30.0–36.0)
MCV: 91.2 fL (ref 80.0–100.0)
Monocytes Absolute: 0.6 10*3/uL (ref 0.1–1.0)
Monocytes Relative: 9 %
Neutro Abs: 3 10*3/uL (ref 1.7–7.7)
Neutrophils Relative %: 42 %
Platelet Count: 267 10*3/uL (ref 150–400)
RBC: 3.87 MIL/uL (ref 3.87–5.11)
RDW: 14.4 % (ref 11.5–15.5)
WBC Count: 7.1 10*3/uL (ref 4.0–10.5)
nRBC: 0 % (ref 0.0–0.2)

## 2021-07-27 LAB — RETIC PANEL
Immature Retic Fract: 3.8 % (ref 2.3–15.9)
RBC.: 3.87 MIL/uL (ref 3.87–5.11)
Retic Count, Absolute: 45.7 10*3/uL (ref 19.0–186.0)
Retic Ct Pct: 1.2 % (ref 0.4–3.1)
Reticulocyte Hemoglobin: 32.7 pg (ref >27.9)

## 2021-07-27 LAB — FERRITIN: Ferritin: 98 ng/mL (ref 11–307)

## 2021-07-27 NOTE — Progress Notes (Signed)
[place] [phone]   [phone]  PROGRESS NOTE  Patient Care Team: [name], MD as PCP - General (Internal Medicine) Constance Haw, MD as PCP - Electrophysiology (Cardiology) Josue Hector, MD as PCP - Cardiology (Cardiology)  Hematological/Oncological History # Invasive Adenocarcinoma of the Colon 09/12/2020: mass noted in distal ascending colon. Pathology consistent with adenocarcinoma.  09/20/2020: office visit with Dr. Dema Severin of colorectal surgery. Plan to proceed with laparoscopic right hemicolectomy pending results of CT C/A/P. 09/21/2020: CT C/A/P performed shows no evidence of metastatic disease. Only noted to have neoplasm in transverse colon.   # Iron Deficiency Anemia 2/2 to GI Bleed 11/13/2006: WBC 8.2, Hgb 10.9, MCV 86.5, Plt 365 03/19/2012: WBC 11.6, Hgb 9.5, MCV 82.5, Plt 339 05/10/2020: WBC 10.3, Hgb 8.7, MCV 81.5, Plt 357 08/11/2020: establish care with Dr. Lorenso Courier. Hgb 10.2, Ferritin 18, TIBC 568 12/22/2020: WBC 8.0, Hgb 11.8, MCV 87.3, Plt 275  Interval History:  Diana Wood 88 y.o. female with medical history significant for  adenocarcinoma of the colon s/p resection and iron deficiency anemia who presents for a follow up visit. The patient's last visit was on 04/27/2021. In the interim since the last visit she has had no major changes in her health.  On exam today Diana Wood reports she has had a weight increase in the interim since her last visit.  Her energy levels remain quite poor.  She ranks her energy as a 1 out of 10.  She reports that she is "tired all the time".  She notes that she is "on and off" the iron pills.  She notes that her weight has increased from 122 pounds up to 127 pounds.  She notes she has had no issues with bleeding or bruising.  Other than her fatigue she has had no other major changes in her health.  She otherwise denies any fevers, chills, sweats, nausea, vomiting or diarrhea.  Full 10 point ROS is  listed below.  MEDICAL HISTORY:  Past Medical History:  Diagnosis Date   Anemia    iron infusions   Arthritis    CAD (coronary artery disease)    a. s/p CABG 2008.   Chest pain    Deep vein thrombosis (HCC)    Diabetes mellitus    GERD (gastroesophageal reflux disease)    Hiatal hernia    Hypercholesterolemia    Hyperlipidemia    Hypertension    Hypothyroid    Obesity    Pleural effusion    Uterine cancer (HCC)     SURGICAL HISTORY: Past Surgical History:  Procedure Laterality Date   ABDOMINAL HYSTERECTOMY     bilateral feet surgery     CATARACT EXTRACTION, BILATERAL     CORONARY ARTERY BYPASS GRAFT     x 4-11'08-Dr. Bartle([facility])   endoscopic vein harvesting from both thighs     Gilford Raid MD   EP IMPLANTABLE DEVICE N/A 08/10/2015   Procedure: Pacemaker Implant;  Surgeon: Will Meredith Leeds, MD;  Location: [facility];  Service: Cardiovascular;  Laterality: N/A;   excision cyst debridement fusion distal interphalangeal joint ring finger  with Mini Acutrak screw 20 mm in length     EYE SURGERY     corneal transplant -right   hysterectomy  01/07/1998   dx of uterine cancer   JOINT REPLACEMENT     right knee replacement-Dr. supple   LAPAROSCOPIC RIGHT HEMI COLECTOMY Right 10/13/2020   Procedure: LAPAROSCOPIC RIGHT HEMI COLECTOMY;  Surgeon: Dema Severin,  Sharon Mt, MD;  Location: [facility];  Service: General;  Laterality: Right;   surgery on 2 fingers     r HAND   TOTAL KNEE ARTHROPLASTY Left 03/16/2012   Procedure: TOTAL KNEE ARTHROPLASTY;  Surgeon: Gearlean Alf, MD;  Location: [facility];  Service: Orthopedics;  Laterality: Left;    SOCIAL HISTORY: Social History   Socioeconomic History   Marital status: Married    Spouse name: Not on file   Number of children: 3   Years of education: Not on file   Highest education level: Not on file  Occupational History   Occupation: retired    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  Tobacco Use   Smoking status:  Never   Smokeless tobacco: Never  Vaping Use   Vaping Use: Never used  Substance and Sexual Activity   Alcohol use: No   Drug use: No   Sexual activity: Yes  Other Topics Concern   Not on file  Social History Narrative   Not on file   Social Determinants of Health   Financial Resource Strain: Not on file  Food Insecurity: Not on file  Transportation Needs: Not on file  Physical Activity: Not on file  Stress: Not on file  Social Connections: Not on file  Intimate Partner Violence: Not on file    FAMILY HISTORY: Family History  Problem Relation Age of Onset   Cancer Mother        type unknown   Diabetes Mother    Blindness Brother        partial from menigitis   Hyperlipidemia Daughter    Thyroid disease Daughter    Colon cancer Neg Hx     ALLERGIES:  is allergic to ace inhibitors, statins, tramadol hcl, losartan, and tape.  MEDICATIONS:  Current Outpatient Medications  Medication Sig Dispense Refill   Accu-Chek Softclix Lancets lancets 1 each by Other route as needed.     amLODipine (NORVASC) 10 MG tablet Take 10 mg by mouth daily. (Patient not taking: Reported on 07/06/2021)     Cholecalciferol (VITAMIN D) 50 MCG (2000 UT) CAPS Take 2,000 Units by mouth daily. (Patient not taking: Reported on 07/06/2021)     fenofibrate (TRICOR) 145 MG tablet Take 145 mg by mouth daily after breakfast.     ferrous sulfate 325 (65 FE) MG tablet Take 325 mg by mouth daily. (Patient not taking: Reported on 07/06/2021)     hydrALAZINE (APRESOLINE) 25 MG tablet Take 50 mg by mouth in the morning and at bedtime.     hydrochlorothiazide (HYDRODIURIL) 12.5 MG tablet Take 12.5 mg by mouth daily after breakfast.      levothyroxine (SYNTHROID) 125 MCG tablet Take 125 mcg by mouth daily before breakfast.     nitroGLYCERIN (NITROSTAT) 0.4 MG SL tablet Place 1 tablet (0.4 mg total) under the tongue every 5 (five) minutes as needed for chest pain. (Patient not taking: Reported on 07/06/2021) 25 tablet  3   Omega-3 Fatty Acids (FISH OIL PO) Take 1 tablet by mouth daily. (Patient not taking: Reported on 07/06/2021)     omeprazole (PRILOSEC) 20 MG capsule Take 20 mg by mouth daily after breakfast.     prednisoLONE acetate (PRED FORTE) 1 % ophthalmic suspension Place 1 drop into both eyes every morning. (Patient not taking: Reported on 07/06/2021)     sertraline (ZOLOFT) 100 MG tablet Take 100 mg by mouth daily.     TRUE METRIX BLOOD GLUCOSE TEST test strip 1 each  by Other route daily.     No current facility-administered medications for this visit.    REVIEW OF SYSTEMS:   Constitutional: ( - ) fevers, ( - )  chills , ( - ) night sweats Eyes: ( - ) blurriness of vision, ( - ) double vision, ( - ) watery eyes Ears, nose, mouth, throat, and face: ( - ) mucositis, ( - ) sore throat Respiratory: ( - ) cough, ( - ) dyspnea, ( - ) wheezes Cardiovascular: ( - ) palpitation, ( - ) chest discomfort, ( - ) lower extremity swelling Gastrointestinal:  ( - ) nausea, ( - ) heartburn, ( - ) change in bowel habits Skin: ( - ) abnormal skin rashes Lymphatics: ( - ) new lymphadenopathy, ( - ) easy bruising Neurological: ( - ) numbness, ( - ) tingling, ( - ) new weaknesses Behavioral/Psych: ( - ) mood change, ( - ) new changes  All other systems were reviewed with the patient and are negative.  PHYSICAL EXAMINATION: ECOG PERFORMANCE STATUS: 1 - Symptomatic but completely ambulatory  Vitals:   07/27/21 1131  BP: (!) 163/76  Pulse: 66  Resp: 19  Temp: 97.8 F (36.6 C)  SpO2: 99%     Filed Weights   07/27/21 1131  Weight: 127 lb 11.2 oz (57.9 kg)      GENERAL: Well-appearing elderly Caucasian female, alert, no distress and comfortable SKIN: skin color, texture, turgor are normal, no rashes or significant lesions EYES: conjunctiva are pink and non-injected, sclera clear LUNGS: clear to auscultation and percussion with normal breathing effort HEART: regular rate & rhythm and no murmurs and no  lower extremity edema Musculoskeletal: no cyanosis of digits and no clubbing  PSYCH: alert & oriented x 3, fluent speech NEURO: no focal motor/sensory deficits  LABORATORY DATA:  I have reviewed the data as listed    Latest Ref Rng & Units 07/27/2021   11:13 AM 04/27/2021   12:46 PM 12/22/2020    8:34 AM  CBC  WBC 4.0 - 10.5 K/uL 7.1  8.2  8.0   Hemoglobin 12.0 - 15.0 g/dL 11.5  11.8  11.8   Hematocrit 36.0 - 46.0 % 35.3  36.6  35.8   Platelets 150 - 400 K/uL 267  317  275        Latest Ref Rng & Units 07/27/2021   11:13 AM 04/27/2021   12:46 PM 12/22/2020    8:34 AM  CMP  Glucose 70 - 99 mg/dL 120  125  104   BUN 8 - 23 mg/dL '24  24  27   '$ Creatinine 0.44 - 1.00 mg/dL 1.02  1.08  1.01   Sodium 135 - 145 mmol/L 139  141  141   Potassium 3.5 - 5.1 mmol/L 3.9  3.6  3.9   Chloride 98 - 111 mmol/L 105  105  107   CO2 22 - 32 mmol/L '28  30  24   '$ Calcium 8.9 - 10.3 mg/dL 9.7  9.8  9.5   Total Protein 6.5 - 8.1 g/dL 6.8  7.3  7.1   Total Bilirubin 0.3 - 1.2 mg/dL 0.5  0.5  0.4   Alkaline Phos 38 - 126 U/L 36  45  63   AST 15 - 41 U/L 26  31  47   ALT 0 - 44 U/L '14  17  26     '$ No results found for: "MPROTEIN" No results found for: "KPAFRELGTCHN", "LAMBDASER", "KAPLAMBRATIO"   Pathology:  RADIOGRAPHIC STUDIES: I have personally reviewed the radiological images as listed and agreed with the findings in the report: No overt signs of metastatic disease outside the colon. No results found.  ASSESSMENT & PLAN Diana Wood 88 y.o. female with medical history significant for newly diagnosed adenocarcinoma of the colon who presents for a follow up visit.   #Adenocarcinoma of the Colon, Stage I ( pT2, pN0, M0)  -- CT chest abdomen pelvis performed on 09/21/2020 shows no evidence of metastatic disease.  Appears to be a single lesion in the transverse colon. --s/p resection of the tumor on 10/13/2020, pathology confirms stage I disease. --recommend f/u colonoscopy in 1 years time.   This would be scheduled for September 2023. --no need for routine imaging or tumor markers in blood.  --continue to follow for iron deficiency.  # Iron Deficiency Anemia 2/2 to GI Bleeding -- Patient has been on p.o. iron therapy but had to stop due to her diarrhea.  We requested that she restart the therapy today. --on 8/19 and 8/26 the patient received IV Feraheme 510 mg q. 7 days x 2 doses --Etiology of her iron deficiency is likely the adenocarcinoma of the colon. Can consider alternative etiologies if her symptoms persist.  --Hgb 11.5 today, stable from prior.  Iron levels appear to be appropriate, likely no benefit from IV iron therapy at this time.  --RTC in 6 months   No orders of the defined types were placed in this encounter.  All questions were answered. The patient knows to call the clinic with any problems, questions or concerns.  A total of more than 30 minutes were spent on this encounter with face-to-face time and non-face-to-face time, including preparing to see the patient, ordering tests and/or medications, counseling the patient and coordination of care as outlined above.   Ledell Peoples, MD Department of Hematology/Oncology [place] [facility] Phone: [phone] Pager: [phone] Email: [email or web address]  07/28/2021 11:24 AM

## 2021-07-28 ENCOUNTER — Encounter: Payer: Self-pay | Admitting: Hematology and Oncology

## 2021-07-30 ENCOUNTER — Telehealth: Payer: Self-pay | Admitting: Hematology and Oncology

## 2021-07-30 NOTE — Telephone Encounter (Signed)
Scheduled per 7/22 in basket, pt daughter has been called and confirmed

## 2021-08-08 ENCOUNTER — Telehealth: Payer: Self-pay | Admitting: *Deleted

## 2021-08-08 NOTE — Telephone Encounter (Signed)
-----   Message from Orson Slick, MD sent at 08/07/2021 10:36 AM EDT ----- Please let Mrs. Headlee know that her iron levels are strong, she does not require IV iron therapy at this time.  We will plan to see her back in 6 months as scheduled. ----- Message ----- From: Buel Ream, [facility] [place] Sent: 07/27/2021  11:27 AM EDT To: Orson Slick, MD

## 2021-08-08 NOTE — Telephone Encounter (Signed)
TCT patient's daughter, Karna Christmas. Spoke with her and advised that her labs are stable with strong iron levels. Advised that she does not need any IV iron at this time. Advised that we will see her back in 6 months. Karna Christmas states she has made that appt already. She is going to call Dr. Blanch Media office about scheduling her mother's next colonoscopy in the morning. No other questions or concerns.

## 2021-08-21 ENCOUNTER — Ambulatory Visit (INDEPENDENT_AMBULATORY_CARE_PROVIDER_SITE_OTHER): Payer: Medicare Other

## 2021-08-21 DIAGNOSIS — I442 Atrioventricular block, complete: Secondary | ICD-10-CM | POA: Diagnosis not present

## 2021-08-21 LAB — CUP PACEART REMOTE DEVICE CHECK
Battery Remaining Longevity: 38 mo
Battery Remaining Percentage: 34 %
Battery Voltage: 2.95 V
Brady Statistic AP VP Percent: 43 %
Brady Statistic AP VS Percent: 1 %
Brady Statistic AS VP Percent: 57 %
Brady Statistic AS VS Percent: 1 %
Brady Statistic RA Percent Paced: 42 %
Brady Statistic RV Percent Paced: 99 %
Date Time Interrogation Session: 20230815020015
Implantable Lead Implant Date: 20170803
Implantable Lead Implant Date: 20170803
Implantable Lead Location: 753859
Implantable Lead Location: 753860
Implantable Pulse Generator Implant Date: 20170803
Lead Channel Impedance Value: 360 Ohm
Lead Channel Impedance Value: 380 Ohm
Lead Channel Pacing Threshold Amplitude: 0.5 V
Lead Channel Pacing Threshold Amplitude: 0.625 V
Lead Channel Pacing Threshold Pulse Width: 0.5 ms
Lead Channel Pacing Threshold Pulse Width: 0.5 ms
Lead Channel Sensing Intrinsic Amplitude: 2 mV
Lead Channel Sensing Intrinsic Amplitude: 7.2 mV
Lead Channel Setting Pacing Amplitude: 0.875
Lead Channel Setting Pacing Amplitude: 2 V
Lead Channel Setting Pacing Pulse Width: 0.5 ms
Lead Channel Setting Sensing Sensitivity: 2 mV
Pulse Gen Model: 2272
Pulse Gen Serial Number: 7929478

## 2021-08-23 ENCOUNTER — Encounter: Payer: Self-pay | Admitting: Internal Medicine

## 2021-09-21 NOTE — Progress Notes (Signed)
Remote pacemaker transmission.   

## 2021-10-31 ENCOUNTER — Ambulatory Visit: Payer: Medicare Other | Admitting: Internal Medicine

## 2021-11-06 DIAGNOSIS — E1021 Type 1 diabetes mellitus with diabetic nephropathy: Secondary | ICD-10-CM | POA: Diagnosis not present

## 2021-11-14 DIAGNOSIS — D509 Iron deficiency anemia, unspecified: Secondary | ICD-10-CM | POA: Diagnosis not present

## 2021-11-14 DIAGNOSIS — E559 Vitamin D deficiency, unspecified: Secondary | ICD-10-CM | POA: Diagnosis not present

## 2021-11-14 DIAGNOSIS — R269 Unspecified abnormalities of gait and mobility: Secondary | ICD-10-CM | POA: Diagnosis not present

## 2021-11-14 DIAGNOSIS — E785 Hyperlipidemia, unspecified: Secondary | ICD-10-CM | POA: Diagnosis not present

## 2021-11-14 DIAGNOSIS — C186 Malignant neoplasm of descending colon: Secondary | ICD-10-CM | POA: Diagnosis not present

## 2021-11-14 DIAGNOSIS — E119 Type 2 diabetes mellitus without complications: Secondary | ICD-10-CM | POA: Diagnosis not present

## 2021-11-14 DIAGNOSIS — I700 Atherosclerosis of aorta: Secondary | ICD-10-CM | POA: Diagnosis not present

## 2021-11-14 DIAGNOSIS — R413 Other amnesia: Secondary | ICD-10-CM | POA: Diagnosis not present

## 2021-11-14 DIAGNOSIS — F325 Major depressive disorder, single episode, in full remission: Secondary | ICD-10-CM | POA: Diagnosis not present

## 2021-11-14 DIAGNOSIS — I2510 Atherosclerotic heart disease of native coronary artery without angina pectoris: Secondary | ICD-10-CM | POA: Diagnosis not present

## 2021-11-14 DIAGNOSIS — I119 Hypertensive heart disease without heart failure: Secondary | ICD-10-CM | POA: Diagnosis not present

## 2021-11-14 DIAGNOSIS — D692 Other nonthrombocytopenic purpura: Secondary | ICD-10-CM | POA: Diagnosis not present

## 2021-11-14 DIAGNOSIS — M8580 Other specified disorders of bone density and structure, unspecified site: Secondary | ICD-10-CM | POA: Diagnosis not present

## 2021-11-14 DIAGNOSIS — Z23 Encounter for immunization: Secondary | ICD-10-CM | POA: Diagnosis not present

## 2021-11-14 DIAGNOSIS — R2689 Other abnormalities of gait and mobility: Secondary | ICD-10-CM | POA: Diagnosis not present

## 2021-11-14 DIAGNOSIS — E039 Hypothyroidism, unspecified: Secondary | ICD-10-CM | POA: Diagnosis not present

## 2021-11-14 DIAGNOSIS — K760 Fatty (change of) liver, not elsewhere classified: Secondary | ICD-10-CM | POA: Diagnosis not present

## 2021-11-20 ENCOUNTER — Ambulatory Visit (INDEPENDENT_AMBULATORY_CARE_PROVIDER_SITE_OTHER): Payer: Medicare Other

## 2021-11-20 DIAGNOSIS — I442 Atrioventricular block, complete: Secondary | ICD-10-CM

## 2021-11-20 DIAGNOSIS — Z950 Presence of cardiac pacemaker: Secondary | ICD-10-CM

## 2021-11-20 LAB — CUP PACEART REMOTE DEVICE CHECK
Battery Remaining Longevity: 35 mo
Battery Remaining Percentage: 31 %
Battery Voltage: 2.93 V
Brady Statistic AP VP Percent: 43 %
Brady Statistic AP VS Percent: 1 %
Brady Statistic AS VP Percent: 57 %
Brady Statistic AS VS Percent: 1 %
Brady Statistic RA Percent Paced: 42 %
Brady Statistic RV Percent Paced: 99 %
Date Time Interrogation Session: 20231114020014
Implantable Lead Connection Status: 753985
Implantable Lead Connection Status: 753985
Implantable Lead Implant Date: 20170803
Implantable Lead Implant Date: 20170803
Implantable Lead Location: 753859
Implantable Lead Location: 753860
Implantable Pulse Generator Implant Date: 20170803
Lead Channel Impedance Value: 400 Ohm
Lead Channel Impedance Value: 400 Ohm
Lead Channel Pacing Threshold Amplitude: 0.5 V
Lead Channel Pacing Threshold Amplitude: 0.75 V
Lead Channel Pacing Threshold Pulse Width: 0.5 ms
Lead Channel Pacing Threshold Pulse Width: 0.5 ms
Lead Channel Sensing Intrinsic Amplitude: 2 mV
Lead Channel Sensing Intrinsic Amplitude: 4 mV
Lead Channel Setting Pacing Amplitude: 1 V
Lead Channel Setting Pacing Amplitude: 2 V
Lead Channel Setting Pacing Pulse Width: 0.5 ms
Lead Channel Setting Sensing Sensitivity: 2 mV
Pulse Gen Model: 2272
Pulse Gen Serial Number: 7929478

## 2021-11-21 ENCOUNTER — Ambulatory Visit (INDEPENDENT_AMBULATORY_CARE_PROVIDER_SITE_OTHER): Payer: Medicare Other | Admitting: Internal Medicine

## 2021-11-21 ENCOUNTER — Encounter: Payer: Self-pay | Admitting: Internal Medicine

## 2021-11-21 VITALS — BP 106/56 | HR 67 | Ht 60.0 in | Wt 121.0 lb

## 2021-11-21 DIAGNOSIS — D500 Iron deficiency anemia secondary to blood loss (chronic): Secondary | ICD-10-CM | POA: Diagnosis not present

## 2021-11-21 DIAGNOSIS — C189 Malignant neoplasm of colon, unspecified: Secondary | ICD-10-CM | POA: Diagnosis not present

## 2021-11-21 DIAGNOSIS — R195 Other fecal abnormalities: Secondary | ICD-10-CM

## 2021-11-21 DIAGNOSIS — R159 Full incontinence of feces: Secondary | ICD-10-CM | POA: Diagnosis not present

## 2021-11-21 DIAGNOSIS — R152 Fecal urgency: Secondary | ICD-10-CM

## 2021-11-21 NOTE — Patient Instructions (Signed)
_______________________________________________________  If you are age 86 or older, your body mass index should be between 23-30. Your Body mass index is 23.63 kg/m. If this is out of the aforementioned range listed, please consider follow up with your Primary Care Provider.  If you are age 66 or younger, your body mass index should be between 19-25. Your Body mass index is 23.63 kg/m. If this is out of the aformentioned range listed, please consider follow up with your Primary Care Provider.   ________________________________________________________  The [facility] providers would like to encourage you to use [facility] to communicate with providers for non-urgent requests or questions.  Due to long hold times on the telephone, sending your provider a message by [facility] may be a faster and more efficient way to get a response.  Please allow 48 business hours for a response.  Please remember that this is for non-urgent requests.  _______________________________________________________  Please follow up as needed

## 2021-11-21 NOTE — Progress Notes (Signed)
HISTORY OF PRESENT ILLNESS:  Diana Wood is a 88 y.o. female with past medical history as listed below who is sent today by her PCP and oncologist regarding surveillance colonoscopy.  She is accompanied by her daughter.  I last saw the patient in the office August 02, 2020 regarding iron deficiency anemia, Hemoccult positive stool, and weight loss.  See that dictation for details.  She subsequently underwent complete colonoscopy September 12, 2020.  She was found to have a right-sided colon cancer.  She subsequently underwent laparoscopic right hemicolectomy October 13, 2020.  The colon cancer was adenocarcinoma stage I.  Her iron deficiency anemia was adequately treated with normalization of hemoglobin.  She is sent today for consideration of surveillance colonoscopy 1 year postop.  Patient tells me that she tolerated her surgery well.  Her weight has been stable.  She remains active though she does have some arthritis in her right knee.  Her daughter informed me that her father, the patient's husband, has been quite ill and had a prolonged hospitalization.  Now it home with around-the-clock home health care.  Patient is ambivalent about surveillance colonoscopy.  Her GI review of systems is remarkable for loose stools since her surgery.  She does have occasional incontinence and now wears protective undergarments.  Last seen by her oncologist Dr. Lorenso Courier July 27, 2021.  Reviewed.  Review of outside blood work from that same day finds unremarkable comprehensive metabolic panel.  CBC with hemoglobin 11.5.  Normal iron studies.  REVIEW OF SYSTEMS:  All non-GI ROS negative unless otherwise stated in the HPI except for right knee pain  Past Medical History:  Diagnosis Date   Anemia    iron infusions   Arthritis    CAD (coronary artery disease)    a. s/p CABG 2008.   Chest pain    Deep vein thrombosis (HCC)    Diabetes mellitus    GERD (gastroesophageal reflux disease)    Hiatal hernia     Hypercholesterolemia    Hyperlipidemia    Hypertension    Hypothyroid    Obesity    Pleural effusion    Uterine cancer (HCC)     Past Surgical History:  Procedure Laterality Date   ABDOMINAL HYSTERECTOMY     bilateral feet surgery     CATARACT EXTRACTION, BILATERAL     CORONARY ARTERY BYPASS GRAFT     x 4-11'08-Dr. Bartle([facility])   endoscopic vein harvesting from both thighs     Gilford Raid MD   EP IMPLANTABLE DEVICE N/A 08/10/2015   Procedure: Pacemaker Implant;  Surgeon: Will Meredith Leeds, MD;  Location: [facility];  Service: Cardiovascular;  Laterality: N/A;   excision cyst debridement fusion distal interphalangeal joint ring finger  with Mini Acutrak screw 20 mm in length     EYE SURGERY     corneal transplant -right   hysterectomy  01/07/1998   dx of uterine cancer   JOINT REPLACEMENT     right knee replacement-Dr. supple   LAPAROSCOPIC RIGHT HEMI COLECTOMY Right 10/13/2020   Procedure: LAPAROSCOPIC RIGHT HEMI COLECTOMY;  Surgeon: Ileana Roup, MD;  Location: [facility];  Service: General;  Laterality: Right;   surgery on 2 fingers     r HAND   TOTAL KNEE ARTHROPLASTY Left 03/16/2012   Procedure: TOTAL KNEE ARTHROPLASTY;  Surgeon: Gearlean Alf, MD;  Location: [facility];  Service: Orthopedics;  Laterality: Left;    Social History Diana Wood  reports that she has never  smoked. She has never used smokeless tobacco. She reports that she does not drink alcohol and does not use drugs.  family history includes Blindness in her brother; Cancer in her mother; Diabetes in her mother; Hyperlipidemia in her daughter; Thyroid disease in her daughter.  Allergies  Allergen Reactions   Ace Inhibitors Cough   Statins     Other reaction(s): Myalgias   Tramadol Hcl     Other reaction(s): 01/08/17 AMS issue in [place] was related to Tramadol   Losartan Cough   Tape Rash    Band aids ok, paper tape ok,       PHYSICAL EXAMINATION: Vital signs: BP (!) 106/56    Pulse 67   Ht 5' (1.524 m)   Wt 121 lb (54.9 kg)   BMI 23.63 kg/m   Constitutional: Pleasant, generally well-appearing, no acute distress Psychiatric: alert and oriented x3, cooperative Eyes: extraocular movements intact, anicteric, conjunctiva pink Mouth: oral pharynx moist, no lesions Neck: supple no lymphadenopathy Cardiovascular: heart regular rate and rhythm, no murmur Lungs: clear to auscultation bilaterally Abdomen: soft, nontender, nondistended, no obvious ascites, no peritoneal signs, normal bowel sounds, no organomegaly Rectal: Omitted Extremities: no clubbing, cyanosis, or lower extremity edema bilaterally Skin: no lesions on visible extremities Neuro: No focal deficits.  Cranial nerves intact  ASSESSMENT:  1.  Personal history of colon cancer status post laparoscopic right hemicolectomy October 2022.  Doing well. 2.  Iron deficiency anemia secondary to the same.  Resolved. 3.  General medical problems.  Stable 4.  Loose stools postop 5.  Incontinence due to loose stools  PLAN:  1.  Despite the patient's advanced age, it is reasonable for once look postop surveillance colonoscopy, given her current health and functional status.  However, I do not feel terribly strong about this.  Patient and her daughter will think about it.  If the patient's clinical status were to change in a negative fashion, then she may not be a candidate.  I would be very comfortable not offering surveillance colonoscopy.  Again, they will discuss. 2.  Continue to follow with hematology/oncology 3.  Recommended low-dose Imodium for loose stools 4.  Continue protective undergarments A total time of 30 minutes was spent preparing to see the patient, reviewing outside data, obtaining interval history, performing medically appropriate physical examination, counseling and educating the patient regarding the above listed issues, and documenting clinical information in the health record

## 2021-12-06 DIAGNOSIS — E1021 Type 1 diabetes mellitus with diabetic nephropathy: Secondary | ICD-10-CM | POA: Diagnosis not present

## 2021-12-06 NOTE — Progress Notes (Signed)
Cardiology Office Note    Date:  12/19/2021  ID:  Diana Wood, Diana Wood 07/22/1932, MRN [id number] PCP:  [name], MD  Cardiologist:  Dr. Johnsie Cancel    Chief Complaint: None f/u CAD and AV block   History of Present Illness:   88 y.o. history of CABG 11/2006, HTN, HLD, Hypothyroidism, DVT, GERD and history of uterine cancer Last myovue normal 02/22/16  Developed bradycardia and had a MRI compatible  PPM placed by Dr Curt Bears August 2017 [facility] Nasuti for thyroid and cholesterol labs   Having some stress with her husband who seems to be more short tempered  No cardiac issues. [place] daughter Eduard Clos 36 years old now   Seen by Dr Curt Bears normal PPM function by [facility] 11/20/21 reviewed  Having some balance issues and falling   She had right hemicolectomy with Dr Vikki Ports October 2022  for invasive adenocarcinoma  Has lost a lot of weight since surgery Husband had PPM placed in October and suffered likely OOH MI, and CVA Follows with Dr Henrene Pastor Hb stable 11.5 Defers f/u colonoscopy recommended by Dr Lorenso Courier her Oncologist   Past Medical History:  Diagnosis Date   Anemia    iron infusions   Arthritis    CAD (coronary artery disease)    a. s/p CABG 2008.   Chest pain    Deep vein thrombosis (HCC)    Diabetes mellitus    GERD (gastroesophageal reflux disease)    Hiatal hernia    Hypercholesterolemia    Hyperlipidemia    Hypertension    Hypothyroid    Obesity    Pleural effusion    Uterine cancer (HCC)     Past Surgical History:  Procedure Laterality Date   ABDOMINAL HYSTERECTOMY     bilateral feet surgery     CATARACT EXTRACTION, BILATERAL     CORONARY ARTERY BYPASS GRAFT     x 4-11'08-Dr. Bartle([facility])   endoscopic vein harvesting from both thighs     Gilford Raid MD   EP IMPLANTABLE DEVICE N/A 08/10/2015   Procedure: Pacemaker Implant;  Surgeon: Will Meredith Leeds, MD;  Location: [facility];  Service: Cardiovascular;  Laterality: N/A;   excision cyst debridement  fusion distal interphalangeal joint ring finger  with Mini Acutrak screw 20 mm in length     EYE SURGERY     corneal transplant -right   hysterectomy  01/07/1998   dx of uterine cancer   JOINT REPLACEMENT     right knee replacement-Dr. supple   LAPAROSCOPIC RIGHT HEMI COLECTOMY Right 10/13/2020   Procedure: LAPAROSCOPIC RIGHT HEMI COLECTOMY;  Surgeon: Ileana Roup, MD;  Location: [facility];  Service: General;  Laterality: Right;   surgery on 2 fingers     r HAND   TOTAL KNEE ARTHROPLASTY Left 03/16/2012   Procedure: TOTAL KNEE ARTHROPLASTY;  Surgeon: Gearlean Alf, MD;  Location: [facility];  Service: Orthopedics;  Laterality: Left;    Current Medications: Current Outpatient Medications  Medication Sig Dispense Refill   Accu-Chek Softclix Lancets lancets 1 each by Other route as needed.     amLODipine (NORVASC) 10 MG tablet Take 10 mg by mouth daily.     Cholecalciferol (VITAMIN D) 50 MCG (2000 UT) CAPS Take 2,000 Units by mouth daily.     fenofibrate (TRICOR) 145 MG tablet Take 145 mg by mouth daily after breakfast.     ferrous sulfate 325 (65 FE) MG tablet Take 325 mg by mouth daily.     hydrALAZINE (APRESOLINE)  25 MG tablet Take 50 mg by mouth in the morning and at bedtime.     hydrochlorothiazide (HYDRODIURIL) 12.5 MG tablet Take 12.5 mg by mouth daily after breakfast.      levothyroxine (SYNTHROID) 125 MCG tablet Take 125 mcg by mouth daily before breakfast.     nitroGLYCERIN (NITROSTAT) 0.4 MG SL tablet Place 1 tablet (0.4 mg total) under the tongue every 5 (five) minutes as needed for chest pain. 25 tablet 3   Omega-3 Fatty Acids (FISH OIL PO) Take 1 tablet by mouth daily.     omeprazole (PRILOSEC) 20 MG capsule Take 20 mg by mouth daily after breakfast.     prednisoLONE acetate (PRED FORTE) 1 % ophthalmic suspension Place 1 drop into both eyes every morning.     sertraline (ZOLOFT) 100 MG tablet Take 100 mg by mouth daily.     TRUE METRIX BLOOD GLUCOSE TEST test strip 1 each  by Other route daily.     No current facility-administered medications for this visit.     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  Tobacco Use   Smoking status: Never   Smokeless tobacco: Never  Vaping Use   Vaping Use: Never used  Substance and Sexual Activity   Alcohol use: No   Drug use: No   Sexual activity: Yes  Other Topics Concern   Not on file  Social History Narrative   Not on file   Social Determinants of Health   Financial Resource Strain: Not on file  Food Insecurity: Not on file  Transportation Needs: Not on file  Physical Activity: Not on file  Stress: Not on file  Social Connections: Not on file     Family History:  The patient's family history includes Blindness in her brother; Cancer in her mother; Diabetes in her mother; Hyperlipidemia in her daughter; Thyroid disease in her daughter.   ROS:   Please see the history of present illness.  All other systems are reviewed and otherwise negative.    PHYSICAL EXAM:   VS:  BP 128/64   Pulse 80   Ht 5' (1.524 m)   Wt 120 lb 9.6 oz (54.7 kg)   SpO2 99%   BMI 23.55 kg/m   BMI: Body mass index is 23.55 kg/m. Affect appropriate Healthy:  appears stated age 89: normal Neck supple with no adenopathy JVP normal no bruits no thyromegaly Lungs clear with no wheezing and good diaphragmatic motion Heart:  S1/S2 no murmur, no rub, gallop or click PPM under left clavicle  PMI normal pacer under right clavicle  Abdomen: benighn, BS positve, no tenderness, no AAA no bruit.  No HSM or HJR post lab hemi colectomy  Distal pulses intact with no bruits No edema Neuro non-focal Skin warm and dry No  muscular weakness   Wt Readings from Last 3 Encounters:  12/19/21 120 lb 9.6 oz (54.7 kg)  11/21/21 121 lb (54.9 kg)  07/27/21 127 lb 11.2 oz (57.9 kg)      Studies/Labs Reviewed:   EKG:   08/04/15 CHB narrow complex escape 02/14/16  P synch pacing LBBB  03/05/17 AV  paced with LBBB morphology   Recent Labs: 07/27/2021: ALT 14; BUN 24; Creatinine 1.02; Hemoglobin 11.5; Platelet Count 267; Potassium 3.9; Sodium 139   Lipid Panel   Additional studies/ records that were reviewed today include: Summarized above.    ASSESSMENT & PLAN:   1. PPM:  V pacing 100% of time  f/u EP [place] [employer] device MRI compatible  2. CAD s/p CABG - non ischemic myovue 02/22/16 EF 66%  New nitro called in  3. Chol:  Labs followed by Virgina Jock on fibrate not sure why not on statin 4. Thyroid:  Synthroid dose reduced f/u labs with Virgina Jock Lab Results  Component Value Date   TSH 4.252 05/10/2020  5. DM:  Discussed low carb diet.  Target hemoglobin A1c is 6.5 or less.  Continue current medications. 6. Anxiety/Depression: on Zoloft mood seems appropriate  7. Colon Cancer:  post right hemi colectomy f/u Dr Pryor Curia

## 2021-12-17 ENCOUNTER — Telehealth: Payer: Self-pay | Admitting: Hematology and Oncology

## 2021-12-17 NOTE — Telephone Encounter (Signed)
Called patient to notify of r/s January appointment. Left voicemail with appointment information.

## 2021-12-19 ENCOUNTER — Ambulatory Visit: Payer: Medicare Other | Attending: Cardiovascular Disease | Admitting: Cardiovascular Disease

## 2021-12-19 ENCOUNTER — Encounter: Payer: Self-pay | Admitting: Cardiovascular Disease

## 2021-12-19 VITALS — BP 128/64 | HR 80 | Ht 60.0 in | Wt 120.6 lb

## 2021-12-19 DIAGNOSIS — Z951 Presence of aortocoronary bypass graft: Secondary | ICD-10-CM | POA: Diagnosis not present

## 2021-12-19 DIAGNOSIS — Z950 Presence of cardiac pacemaker: Secondary | ICD-10-CM

## 2021-12-19 NOTE — Patient Instructions (Signed)
Medication Instructions:  Your physician recommends that you continue on your current medications as directed. Please refer to the Current Medication list given to you today.  *If you need a refill on your cardiac medications before your next appointment, please call your pharmacy*  Lab Work: If you have labs (blood work) drawn today and your tests are completely normal, you will receive your results only by: [place] (if you have MyChart) OR A paper copy in the mail If you have any lab test that is abnormal or we need to change your treatment, we will call you to review the results.  Testing/Procedures: None ordered today.  Follow-Up: At [facility], you and your health needs are our priority.  As part of our continuing mission to provide you with exceptional heart care, we have created designated Provider Care Teams.  These Care Teams include your primary Cardiologist (physician) and Advanced Practice Providers (APPs -  Physician Assistants and Nurse Practitioners) who all work together to provide you with the care you need, when you need it.  We recommend signing up for the patient portal called "MyChart".  Sign up information is provided on this After Visit Summary.  MyChart is used to connect with patients for Virtual Visits (Telemedicine).  Patients are able to view lab/test results, encounter notes, upcoming appointments, etc.  Non-urgent messages can be sent to your provider as well.   To learn more about what you can do with MyChart, go to [email or web address].    Your next appointment:   6 month(s)  The format for your next appointment:   In Person  Provider:   Jenkins Rouge, MD      Important Information About Sugar

## 2022-01-06 DIAGNOSIS — E1021 Type 1 diabetes mellitus with diabetic nephropathy: Secondary | ICD-10-CM | POA: Diagnosis not present

## 2022-01-10 DIAGNOSIS — E119 Type 2 diabetes mellitus without complications: Secondary | ICD-10-CM | POA: Diagnosis not present

## 2022-01-10 DIAGNOSIS — E876 Hypokalemia: Secondary | ICD-10-CM | POA: Diagnosis not present

## 2022-01-10 DIAGNOSIS — E039 Hypothyroidism, unspecified: Secondary | ICD-10-CM | POA: Diagnosis not present

## 2022-01-10 DIAGNOSIS — I700 Atherosclerosis of aorta: Secondary | ICD-10-CM | POA: Diagnosis not present

## 2022-01-10 DIAGNOSIS — E785 Hyperlipidemia, unspecified: Secondary | ICD-10-CM | POA: Diagnosis not present

## 2022-01-30 ENCOUNTER — Encounter: Payer: Self-pay | Admitting: Hematology and Oncology

## 2022-02-01 ENCOUNTER — Ambulatory Visit: Payer: Medicare Other | Admitting: Hematology and Oncology

## 2022-02-01 ENCOUNTER — Other Ambulatory Visit: Payer: Medicare Other

## 2022-02-05 ENCOUNTER — Other Ambulatory Visit: Payer: Self-pay | Admitting: *Deleted

## 2022-02-05 DIAGNOSIS — D500 Iron deficiency anemia secondary to blood loss (chronic): Secondary | ICD-10-CM

## 2022-02-06 ENCOUNTER — Other Ambulatory Visit: Payer: Self-pay

## 2022-02-06 ENCOUNTER — Inpatient Hospital Stay (HOSPITAL_BASED_OUTPATIENT_CLINIC_OR_DEPARTMENT_OTHER): Payer: Medicare PPO | Admitting: Hematology and Oncology

## 2022-02-06 ENCOUNTER — Inpatient Hospital Stay: Payer: Medicare PPO | Attending: Hematology and Oncology

## 2022-02-06 VITALS — BP 127/57 | HR 68 | Temp 98.1°F | Resp 14 | Wt 118.5 lb

## 2022-02-06 DIAGNOSIS — Z821 Family history of blindness and visual loss: Secondary | ICD-10-CM | POA: Insufficient documentation

## 2022-02-06 DIAGNOSIS — Z809 Family history of malignant neoplasm, unspecified: Secondary | ICD-10-CM | POA: Diagnosis not present

## 2022-02-06 DIAGNOSIS — D500 Iron deficiency anemia secondary to blood loss (chronic): Secondary | ICD-10-CM | POA: Insufficient documentation

## 2022-02-06 DIAGNOSIS — Z833 Family history of diabetes mellitus: Secondary | ICD-10-CM | POA: Insufficient documentation

## 2022-02-06 DIAGNOSIS — K922 Gastrointestinal hemorrhage, unspecified: Secondary | ICD-10-CM | POA: Insufficient documentation

## 2022-02-06 DIAGNOSIS — Z79899 Other long term (current) drug therapy: Secondary | ICD-10-CM | POA: Insufficient documentation

## 2022-02-06 DIAGNOSIS — D539 Nutritional anemia, unspecified: Secondary | ICD-10-CM

## 2022-02-06 DIAGNOSIS — C184 Malignant neoplasm of transverse colon: Secondary | ICD-10-CM | POA: Insufficient documentation

## 2022-02-06 DIAGNOSIS — C189 Malignant neoplasm of colon, unspecified: Secondary | ICD-10-CM | POA: Diagnosis not present

## 2022-02-06 DIAGNOSIS — Z8349 Family history of other endocrine, nutritional and metabolic diseases: Secondary | ICD-10-CM | POA: Diagnosis not present

## 2022-02-06 LAB — CBC WITH DIFFERENTIAL (CANCER CENTER ONLY)
Abs Immature Granulocytes: 0.05 10*3/uL (ref 0.00–0.07)
Basophils Absolute: 0 10*3/uL (ref 0.0–0.1)
Basophils Relative: 0 %
Eosinophils Absolute: 0.1 10*3/uL (ref 0.0–0.5)
Eosinophils Relative: 1 %
HCT: 33.8 % — ABNORMAL LOW (ref 36.0–46.0)
Hemoglobin: 11.3 g/dL — ABNORMAL LOW (ref 12.0–15.0)
Immature Granulocytes: 0 %
Lymphocytes Relative: 37 %
Lymphs Abs: 4.6 10*3/uL — ABNORMAL HIGH (ref 0.7–4.0)
MCH: 29.8 pg (ref 26.0–34.0)
MCHC: 33.4 g/dL (ref 30.0–36.0)
MCV: 89.2 fL (ref 80.0–100.0)
Monocytes Absolute: 1.1 10*3/uL — ABNORMAL HIGH (ref 0.1–1.0)
Monocytes Relative: 8 %
Neutro Abs: 6.8 10*3/uL (ref 1.7–7.7)
Neutrophils Relative %: 54 %
Platelet Count: 378 10*3/uL (ref 150–400)
RBC: 3.79 MIL/uL — ABNORMAL LOW (ref 3.87–5.11)
RDW: 15.1 % (ref 11.5–15.5)
WBC Count: 12.6 10*3/uL — ABNORMAL HIGH (ref 4.0–10.5)
nRBC: 0 % (ref 0.0–0.2)

## 2022-02-06 LAB — RETIC PANEL
Immature Retic Fract: 6.7 % (ref 2.3–15.9)
RBC.: 3.78 MIL/uL — ABNORMAL LOW (ref 3.87–5.11)
Retic Count, Absolute: 64.3 10*3/uL (ref 19.0–186.0)
Retic Ct Pct: 1.7 % (ref 0.4–3.1)
Reticulocyte Hemoglobin: 33.7 pg (ref >27.9)

## 2022-02-06 LAB — CMP (CANCER CENTER ONLY)
ALT: 29 U/L (ref 0–44)
AST: 50 U/L — ABNORMAL HIGH (ref 15–41)
Albumin: 4.3 g/dL (ref 3.5–5.0)
Alkaline Phosphatase: 38 U/L (ref 38–126)
Anion gap: 10 (ref 5–15)
BUN: 37 mg/dL — ABNORMAL HIGH (ref 8–23)
CO2: 27 mmol/L (ref 22–32)
Calcium: 9.9 mg/dL (ref 8.9–10.3)
Chloride: 101 mmol/L (ref 98–111)
Creatinine: 1.33 mg/dL — ABNORMAL HIGH (ref 0.44–1.00)
GFR, Estimated: 39 mL/min — ABNORMAL LOW (ref >60)
Glucose, Bld: 114 mg/dL — ABNORMAL HIGH (ref 70–99)
Potassium: 3.8 mmol/L (ref 3.5–5.1)
Sodium: 138 mmol/L (ref 135–145)
Total Bilirubin: 0.6 mg/dL (ref 0.3–1.2)
Total Protein: 7.6 g/dL (ref 6.5–8.1)

## 2022-02-06 LAB — IRON AND IRON BINDING CAPACITY (CC-WL,HP ONLY)
Iron: 115 ug/dL (ref 28–170)
Saturation Ratios: 23 % (ref 10.4–31.8)
TIBC: 494 ug/dL — ABNORMAL HIGH (ref 250–450)
UIBC: 379 ug/dL

## 2022-02-06 LAB — FERRITIN: Ferritin: 305 ng/mL (ref 11–307)

## 2022-02-06 NOTE — Progress Notes (Signed)
[place] Telephone:(336) [phone]   Fax:(336) [phone]  PROGRESS NOTE  Patient Care Team: [name], MD as PCP - General (Internal Medicine) Constance Haw, MD as PCP - Electrophysiology (Cardiology) Josue Hector, MD as PCP - Cardiology (Cardiology)  Hematological/Oncological History # Invasive Adenocarcinoma of the Colon 09/12/2020: mass noted in distal ascending colon. Pathology consistent with adenocarcinoma.  09/20/2020: office visit with Dr. Dema Severin of colorectal surgery. Plan to proceed with laparoscopic right hemicolectomy pending results of CT C/A/P. 09/21/2020: CT C/A/P performed shows no evidence of metastatic disease. Only noted to have neoplasm in transverse colon.   # Iron Deficiency Anemia 2/2 to GI Bleed 11/13/2006: WBC 8.2, Hgb 10.9, MCV 86.5, Plt 365 03/19/2012: WBC 11.6, Hgb 9.5, MCV 82.5, Plt 339 05/10/2020: WBC 10.3, Hgb 8.7, MCV 81.5, Plt 357 08/11/2020: establish care with Dr. Lorenso Courier. Hgb 10.2, Ferritin 18, TIBC 568 12/22/2020: WBC 8.0, Hgb 11.8, MCV 87.3, Plt 275  Interval History:  Diana Wood 89 y.o. female with medical history significant for  adenocarcinoma of the colon s/p resection and iron deficiency anemia who presents for a follow up visit. The patient's last visit was on 07/27/2021. In the interim since the last visit she has had no major changes in her health.  On exam today Mrs. Kohn reports she has been having trouble with fatigue.  She reports her energy is currently about a 5 or 6 out of 10.  She does take naps but not every day.  She is not having any bleeding, bruising, or dark stools.  She reports that she is eating quite well.  She notes that her husband had a heart attack, stroke, and developed CKD in October.  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  She notes that she is finally eating 3 square meals a day and taking her medication as prescribed.  She notes that she is taking her iron  pill every other day and is not causing any stomach upset or constipation.  She does get dizzy on occasion but no shortness of breath or chest pain.  She reports no lightheadedness.  She notes that she is on a more strict diet due to her husband's issues.  She has discussed colonoscopy with Dr. Henrene Pastor and decided against pursuing 1.  She understands the risks and benefits of this..  She otherwise denies any fevers, chills, sweats, nausea, vomiting or diarrhea.  Full 10 point ROS is listed below.  MEDICAL HISTORY:  Past Medical History:  Diagnosis Date   Anemia    iron infusions   Arthritis    CAD (coronary artery disease)    a. s/p CABG 2008.   Chest pain    Deep vein thrombosis (HCC)    Diabetes mellitus    GERD (gastroesophageal reflux disease)    Hiatal hernia    Hypercholesterolemia    Hyperlipidemia    Hypertension    Hypothyroid    Obesity    Pleural effusion    Uterine cancer (HCC)     SURGICAL HISTORY: Past Surgical History:  Procedure Laterality Date   ABDOMINAL HYSTERECTOMY     bilateral feet surgery     CATARACT EXTRACTION, BILATERAL     CORONARY ARTERY BYPASS GRAFT     x 4-11'08-Dr. Bartle([facility])   endoscopic vein harvesting from both thighs     Gilford Raid MD   EP IMPLANTABLE DEVICE N/A 08/10/2015   Procedure: Pacemaker Implant;  Surgeon: Will  Meredith Leeds, MD;  Location: [facility];  Service: Cardiovascular;  Laterality: N/A;   excision cyst debridement fusion distal interphalangeal joint ring finger  with Mini Acutrak screw 20 mm in length     EYE SURGERY     corneal transplant -right   hysterectomy  01/07/1998   dx of uterine cancer   JOINT REPLACEMENT     right knee replacement-Dr. supple   LAPAROSCOPIC RIGHT HEMI COLECTOMY Right 10/13/2020   Procedure: LAPAROSCOPIC RIGHT HEMI COLECTOMY;  Surgeon: Ileana Roup, MD;  Location: [facility];  Service: General;  Laterality: Right;   surgery on 2 fingers     r HAND   TOTAL KNEE ARTHROPLASTY Left  03/16/2012   Procedure: TOTAL KNEE ARTHROPLASTY;  Surgeon: Gearlean Alf, MD;  Location: [facility];  Service: Orthopedics;  Laterality: Left;    SOCIAL HISTORY: Social History   Socioeconomic History   Marital status: Married    Spouse name: Not on file   Number of children: 3   Years of education: Not on file   Highest education level: Not on file  Occupational History   Occupation: retired    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  Tobacco Use   Smoking status: Never   Smokeless tobacco: Never  Vaping Use   Vaping Use: Never used  Substance and Sexual Activity   Alcohol use: No   Drug use: No   Sexual activity: Yes  Other Topics Concern   Not on file  Social History Narrative   Not on file   Social Determinants of Health   Financial Resource Strain: Not on file  Food Insecurity: Not on file  Transportation Needs: Not on file  Physical Activity: Not on file  Stress: Not on file  Social Connections: Not on file  Intimate Partner Violence: Not on file    FAMILY HISTORY: Family History  Problem Relation Age of Onset   Cancer Mother        type unknown   Diabetes Mother    Blindness Brother        partial from menigitis   Hyperlipidemia Daughter    Thyroid disease Daughter    Colon cancer Neg Hx     ALLERGIES:  is allergic to ace inhibitors, statins, tramadol hcl, losartan, and tape.  MEDICATIONS:  Current Outpatient Medications  Medication Sig Dispense Refill   Accu-Chek Softclix Lancets lancets 1 each by Other route as needed.     amLODipine (NORVASC) 10 MG tablet Take 10 mg by mouth daily.     Cholecalciferol (VITAMIN D) 50 MCG (2000 UT) CAPS Take 2,000 Units by mouth daily.     fenofibrate (TRICOR) 145 MG tablet Take 145 mg by mouth daily after breakfast.     ferrous sulfate 325 (65 FE) MG tablet Take 325 mg by mouth daily.     hydrALAZINE (APRESOLINE) 25 MG tablet Take 50 mg by mouth in the morning and at bedtime.     hydrochlorothiazide  (HYDRODIURIL) 12.5 MG tablet Take 12.5 mg by mouth daily after breakfast.      levothyroxine (SYNTHROID) 125 MCG tablet Take 125 mcg by mouth daily before breakfast.     nitroGLYCERIN (NITROSTAT) 0.4 MG SL tablet Place 1 tablet (0.4 mg total) under the tongue every 5 (five) minutes as needed for chest pain. 25 tablet 3   Omega-3 Fatty Acids (FISH OIL PO) Take 1 tablet by mouth daily.     omeprazole (PRILOSEC) 20 MG capsule Take 20 mg  by mouth daily after breakfast.     prednisoLONE acetate (PRED FORTE) 1 % ophthalmic suspension Place 1 drop into both eyes every morning.     sertraline (ZOLOFT) 100 MG tablet Take 100 mg by mouth daily.     TRUE METRIX BLOOD GLUCOSE TEST test strip 1 each by Other route daily.     No current facility-administered medications for this visit.    REVIEW OF SYSTEMS:   Constitutional: ( - ) fevers, ( - )  chills , ( - ) night sweats Eyes: ( - ) blurriness of vision, ( - ) double vision, ( - ) watery eyes Ears, nose, mouth, throat, and face: ( - ) mucositis, ( - ) sore throat Respiratory: ( - ) cough, ( - ) dyspnea, ( - ) wheezes Cardiovascular: ( - ) palpitation, ( - ) chest discomfort, ( - ) lower extremity swelling Gastrointestinal:  ( - ) nausea, ( - ) heartburn, ( - ) change in bowel habits Skin: ( - ) abnormal skin rashes Lymphatics: ( - ) new lymphadenopathy, ( - ) easy bruising Neurological: ( - ) numbness, ( - ) tingling, ( - ) new weaknesses Behavioral/Psych: ( - ) mood change, ( - ) new changes  All other systems were reviewed with the patient and are negative.  PHYSICAL EXAMINATION: ECOG PERFORMANCE STATUS: 1 - Symptomatic but completely ambulatory  Vitals:   02/06/22 1446  BP: (!) 127/57  Pulse: 68  Resp: 14  Temp: 98.1 F (36.7 C)  SpO2: 99%     Filed Weights   02/06/22 1446  Weight: 118 lb 8 oz (53.8 kg)      GENERAL: Well-appearing elderly Caucasian female, alert, no distress and comfortable SKIN: skin color, texture, turgor are  normal, no rashes or significant lesions EYES: conjunctiva are pink and non-injected, sclera clear LUNGS: clear to auscultation and percussion with normal breathing effort HEART: regular rate & rhythm and no murmurs and no lower extremity edema Musculoskeletal: no cyanosis of digits and no clubbing  PSYCH: alert & oriented x 3, fluent speech NEURO: no focal motor/sensory deficits  LABORATORY DATA:  I have reviewed the data as listed    Latest Ref Rng & Units 02/06/2022    2:20 PM 07/27/2021   11:13 AM 04/27/2021   12:46 PM  CBC  WBC 4.0 - 10.5 K/uL 12.6  7.1  8.2   Hemoglobin 12.0 - 15.0 g/dL 11.3  11.5  11.8   Hematocrit 36.0 - 46.0 % 33.8  35.3  36.6   Platelets 150 - 400 K/uL 378  267  317        Latest Ref Rng & Units 02/06/2022    2:20 PM 07/27/2021   11:13 AM 04/27/2021   12:46 PM  CMP  Glucose 70 - 99 mg/dL 114  120  125   BUN 8 - 23 mg/dL 37  24  24   Creatinine 0.44 - 1.00 mg/dL 1.33  1.02  1.08   Sodium 135 - 145 mmol/L 138  139  141   Potassium 3.5 - 5.1 mmol/L 3.8  3.9  3.6   Chloride 98 - 111 mmol/L 101  105  105   CO2 22 - 32 mmol/L '27  28  30   '$ Calcium 8.9 - 10.3 mg/dL 9.9  9.7  9.8   Total Protein 6.5 - 8.1 g/dL 7.6  6.8  7.3   Total Bilirubin 0.3 - 1.2 mg/dL 0.6  0.5  0.5   Alkaline Phos  38 - 126 U/L 38  36  45   AST 15 - 41 U/L 50  26  31   ALT 0 - 44 U/L '29  14  17     '$ No results found for: "MPROTEIN" No results found for: "KPAFRELGTCHN", "LAMBDASER", "KAPLAMBRATIO"   Pathology:    RADIOGRAPHIC STUDIES: I have personally reviewed the radiological images as listed and agreed with the findings in the report: No overt signs of metastatic disease outside the colon. No results found.  ASSESSMENT & PLAN RAIVEN BELIZAIRE 89 y.o. female with medical history significant for newly diagnosed adenocarcinoma of the colon who presents for a follow up visit.   #Adenocarcinoma of the Colon, Stage I ( pT2, pN0, M0)  -- CT chest abdomen pelvis performed on 09/21/2020  shows no evidence of metastatic disease.  Appears to be a single lesion in the transverse colon. --s/p resection of the tumor on 10/13/2020, pathology confirms stage I disease. --guidelines recommend f/u colonoscopy in 1 years time.  It was discussed with Dr. Henrene Pastor and due to the patient's advanced age they decided not to perform this procedure. --no need for routine imaging or tumor markers in blood.  --continue to follow for iron deficiency.  # Iron Deficiency Anemia 2/2 to GI Bleeding -- Patient has been on p.o. iron therapy but had to stop due to her diarrhea.  We requested that she restart the therapy today. --on 8/19 and 8/26 the patient received IV Feraheme 510 mg q. 7 days x 2 doses --Etiology of her iron deficiency is likely the adenocarcinoma of the colon. Can consider alternative etiologies if her symptoms persist.  --Hgb 11.3 today, stable from prior.  Iron levels appear to be appropriate, await the final results.  If necessary can pursue additional rounds of IV iron therapy. --RTC in 6 months   No orders of the defined types were placed in this encounter.  All questions were answered. The patient knows to call the clinic with any problems, questions or concerns.  A total of more than 30 minutes were spent on this encounter with face-to-face time and non-face-to-face time, including preparing to see the patient, ordering tests and/or medications, counseling the patient and coordination of care as outlined above.   Ledell Peoples, MD Department of Hematology/Oncology [facility] Phone: [phone] Pager: [phone] Email: [email or web address]  02/06/2022 4:46 PM

## 2022-02-07 ENCOUNTER — Telehealth: Payer: Self-pay | Admitting: Hematology and Oncology

## 2022-02-07 NOTE — Telephone Encounter (Signed)
Called patient to confirm new appointments. Spoke with patient's daughter. Patient will be notified.

## 2022-02-08 DIAGNOSIS — R269 Unspecified abnormalities of gait and mobility: Secondary | ICD-10-CM | POA: Diagnosis not present

## 2022-02-08 DIAGNOSIS — D692 Other nonthrombocytopenic purpura: Secondary | ICD-10-CM | POA: Diagnosis not present

## 2022-02-08 DIAGNOSIS — D509 Iron deficiency anemia, unspecified: Secondary | ICD-10-CM | POA: Diagnosis not present

## 2022-02-08 DIAGNOSIS — E119 Type 2 diabetes mellitus without complications: Secondary | ICD-10-CM | POA: Diagnosis not present

## 2022-02-08 DIAGNOSIS — F325 Major depressive disorder, single episode, in full remission: Secondary | ICD-10-CM | POA: Diagnosis not present

## 2022-02-08 DIAGNOSIS — I700 Atherosclerosis of aorta: Secondary | ICD-10-CM | POA: Diagnosis not present

## 2022-02-08 DIAGNOSIS — E039 Hypothyroidism, unspecified: Secondary | ICD-10-CM | POA: Diagnosis not present

## 2022-02-08 DIAGNOSIS — I119 Hypertensive heart disease without heart failure: Secondary | ICD-10-CM | POA: Diagnosis not present

## 2022-02-08 DIAGNOSIS — I2510 Atherosclerotic heart disease of native coronary artery without angina pectoris: Secondary | ICD-10-CM | POA: Diagnosis not present

## 2022-02-18 DIAGNOSIS — L821 Other seborrheic keratosis: Secondary | ICD-10-CM | POA: Diagnosis not present

## 2022-02-18 DIAGNOSIS — C44622 Squamous cell carcinoma of skin of right upper limb, including shoulder: Secondary | ICD-10-CM | POA: Diagnosis not present

## 2022-02-19 ENCOUNTER — Ambulatory Visit: Payer: Medicare PPO

## 2022-02-19 DIAGNOSIS — I442 Atrioventricular block, complete: Secondary | ICD-10-CM

## 2022-02-19 LAB — CUP PACEART REMOTE DEVICE CHECK
Battery Remaining Longevity: 32 mo
Battery Remaining Percentage: 28 %
Battery Voltage: 2.93 V
Brady Statistic AP VP Percent: 42 %
Brady Statistic AP VS Percent: 1 %
Brady Statistic AS VP Percent: 57 %
Brady Statistic AS VS Percent: 1 %
Brady Statistic RA Percent Paced: 41 %
Brady Statistic RV Percent Paced: 99 %
Date Time Interrogation Session: 20240213020017
Implantable Lead Connection Status: 753985
Implantable Lead Connection Status: 753985
Implantable Lead Implant Date: 20170803
Implantable Lead Implant Date: 20170803
Implantable Lead Location: 753859
Implantable Lead Location: 753860
Implantable Pulse Generator Implant Date: 20170803
Lead Channel Impedance Value: 380 Ohm
Lead Channel Impedance Value: 410 Ohm
Lead Channel Pacing Threshold Amplitude: 0.5 V
Lead Channel Pacing Threshold Amplitude: 0.75 V
Lead Channel Pacing Threshold Pulse Width: 0.5 ms
Lead Channel Pacing Threshold Pulse Width: 0.5 ms
Lead Channel Sensing Intrinsic Amplitude: 1.7 mV
Lead Channel Sensing Intrinsic Amplitude: 9 mV
Lead Channel Setting Pacing Amplitude: 1 V
Lead Channel Setting Pacing Amplitude: 2 V
Lead Channel Setting Pacing Pulse Width: 0.5 ms
Lead Channel Setting Sensing Sensitivity: 2 mV
Pulse Gen Model: 2272
Pulse Gen Serial Number: 7929478

## 2022-03-15 ENCOUNTER — Telehealth: Payer: Self-pay | Admitting: Hematology and Oncology

## 2022-03-15 NOTE — Telephone Encounter (Signed)
Called patient to reschedule 4/25 appointment due to provider being on call. Patient rescheduled and notified.

## 2022-03-19 ENCOUNTER — Encounter: Payer: Self-pay | Admitting: Hematology and Oncology

## 2022-03-25 NOTE — Progress Notes (Signed)
Remote pacemaker transmission.   

## 2022-04-11 ENCOUNTER — Encounter: Payer: Self-pay | Admitting: Hematology and Oncology

## 2022-04-18 ENCOUNTER — Other Ambulatory Visit: Payer: Self-pay

## 2022-04-18 ENCOUNTER — Inpatient Hospital Stay (HOSPITAL_BASED_OUTPATIENT_CLINIC_OR_DEPARTMENT_OTHER): Payer: Medicare Other | Admitting: Hematology and Oncology

## 2022-04-18 ENCOUNTER — Other Ambulatory Visit: Payer: Self-pay | Admitting: Hematology and Oncology

## 2022-04-18 ENCOUNTER — Inpatient Hospital Stay: Payer: Medicare Other | Attending: Hematology and Oncology

## 2022-04-18 VITALS — BP 139/64 | HR 84 | Temp 97.7°F | Resp 15 | Wt 118.9 lb

## 2022-04-18 DIAGNOSIS — C189 Malignant neoplasm of colon, unspecified: Secondary | ICD-10-CM | POA: Diagnosis not present

## 2022-04-18 DIAGNOSIS — D500 Iron deficiency anemia secondary to blood loss (chronic): Secondary | ICD-10-CM | POA: Insufficient documentation

## 2022-04-18 DIAGNOSIS — Z79899 Other long term (current) drug therapy: Secondary | ICD-10-CM | POA: Diagnosis not present

## 2022-04-18 DIAGNOSIS — Z833 Family history of diabetes mellitus: Secondary | ICD-10-CM | POA: Insufficient documentation

## 2022-04-18 DIAGNOSIS — R197 Diarrhea, unspecified: Secondary | ICD-10-CM | POA: Insufficient documentation

## 2022-04-18 DIAGNOSIS — C182 Malignant neoplasm of ascending colon: Secondary | ICD-10-CM | POA: Diagnosis not present

## 2022-04-18 DIAGNOSIS — Z8349 Family history of other endocrine, nutritional and metabolic diseases: Secondary | ICD-10-CM | POA: Diagnosis not present

## 2022-04-18 DIAGNOSIS — Z821 Family history of blindness and visual loss: Secondary | ICD-10-CM | POA: Insufficient documentation

## 2022-04-18 DIAGNOSIS — K922 Gastrointestinal hemorrhage, unspecified: Secondary | ICD-10-CM | POA: Diagnosis not present

## 2022-04-18 DIAGNOSIS — Z809 Family history of malignant neoplasm, unspecified: Secondary | ICD-10-CM | POA: Insufficient documentation

## 2022-04-18 LAB — IRON AND IRON BINDING CAPACITY (CC-WL,HP ONLY)
Iron: 138 ug/dL (ref 28–170)
Saturation Ratios: 29 % (ref 10.4–31.8)
TIBC: 475 ug/dL — ABNORMAL HIGH (ref 250–450)
UIBC: 337 ug/dL (ref 148–442)

## 2022-04-18 LAB — CBC WITH DIFFERENTIAL (CANCER CENTER ONLY)
Abs Immature Granulocytes: 0.05 10*3/uL (ref 0.00–0.07)
Basophils Absolute: 0 10*3/uL (ref 0.0–0.1)
Basophils Relative: 0 %
Eosinophils Absolute: 0 10*3/uL (ref 0.0–0.5)
Eosinophils Relative: 0 %
HCT: 37.2 % (ref 36.0–46.0)
Hemoglobin: 12.3 g/dL (ref 12.0–15.0)
Immature Granulocytes: 1 %
Lymphocytes Relative: 40 %
Lymphs Abs: 4.1 10*3/uL — ABNORMAL HIGH (ref 0.7–4.0)
MCH: 29.9 pg (ref 26.0–34.0)
MCHC: 33.1 g/dL (ref 30.0–36.0)
MCV: 90.5 fL (ref 80.0–100.0)
Monocytes Absolute: 0.7 10*3/uL (ref 0.1–1.0)
Monocytes Relative: 7 %
Neutro Abs: 5.3 10*3/uL (ref 1.7–7.7)
Neutrophils Relative %: 52 %
Platelet Count: 364 10*3/uL (ref 150–400)
RBC: 4.11 MIL/uL (ref 3.87–5.11)
RDW: 14.3 % (ref 11.5–15.5)
WBC Count: 10.2 10*3/uL (ref 4.0–10.5)
nRBC: 0 % (ref 0.0–0.2)

## 2022-04-18 LAB — CMP (CANCER CENTER ONLY)
ALT: 24 U/L (ref 0–44)
AST: 51 U/L — ABNORMAL HIGH (ref 15–41)
Albumin: 4.2 g/dL (ref 3.5–5.0)
Alkaline Phosphatase: 52 U/L (ref 38–126)
Anion gap: 9 (ref 5–15)
BUN: 35 mg/dL — ABNORMAL HIGH (ref 8–23)
CO2: 28 mmol/L (ref 22–32)
Calcium: 10.5 mg/dL — ABNORMAL HIGH (ref 8.9–10.3)
Chloride: 102 mmol/L (ref 98–111)
Creatinine: 1.46 mg/dL — ABNORMAL HIGH (ref 0.44–1.00)
GFR, Estimated: 35 mL/min — ABNORMAL LOW (ref >60)
Glucose, Bld: 182 mg/dL — ABNORMAL HIGH (ref 70–99)
Potassium: 4.1 mmol/L (ref 3.5–5.1)
Sodium: 139 mmol/L (ref 135–145)
Total Bilirubin: 0.5 mg/dL (ref 0.3–1.2)
Total Protein: 7.4 g/dL (ref 6.5–8.1)

## 2022-04-18 LAB — RETIC PANEL
Immature Retic Fract: 3.5 % (ref 2.3–15.9)
RBC.: 4.08 MIL/uL (ref 3.87–5.11)
Retic Count, Absolute: 51 10*3/uL (ref 19.0–186.0)
Retic Ct Pct: 1.3 % (ref 0.4–3.1)
Reticulocyte Hemoglobin: 32.9 pg (ref >27.9)

## 2022-04-18 LAB — FERRITIN: Ferritin: 254 ng/mL (ref 11–307)

## 2022-04-18 NOTE — Progress Notes (Signed)
[facility] Telephone:(336) [phone]   Fax:(336) [phone]  PROGRESS NOTE  Patient Care Team: Creola Corn, MD as PCP - General (Internal Medicine) Regan Lemming, MD as PCP - Electrophysiology (Cardiology) Wendall Stade, MD as PCP - Cardiology (Cardiology)  Hematological/Oncological History # Invasive Adenocarcinoma of the Colon 09/12/2020: mass noted in distal ascending colon. Pathology consistent with adenocarcinoma.  09/20/2020: office visit with Dr. Cliffton Asters of colorectal surgery. Plan to proceed with laparoscopic right hemicolectomy pending results of CT C/A/P. 09/21/2020: CT C/A/P performed shows no evidence of metastatic disease. Only noted to have neoplasm in transverse colon.   # Iron Deficiency Anemia 2/2 to GI Bleed 11/13/2006: WBC 8.2, Hgb 10.9, MCV 86.5, Plt 365 03/19/2012: WBC 11.6, Hgb 9.5, MCV 82.5, Plt 339 05/10/2020: WBC 10.3, Hgb 8.7, MCV 81.5, Plt 357 08/11/2020: establish care with Dr. Leonides Schanz. Hgb 10.2, Ferritin 18, TIBC 568 12/22/2020: WBC 8.0, Hgb 11.8, MCV 87.3, Plt 275 04/18/2022: WBC 10.2, Hgb 12.3, MCV 90.5, Plt 364  Interval History:  Diana Wood 89 y.o. female with medical history significant for  adenocarcinoma of the colon s/p resection and iron deficiency anemia who presents for a follow up visit. The patient's last visit was on 02/06/2022. In the interim since the last visit she has had no major changes in her health.  On exam today Diana Wood reports she has been taking her iron pills faithfully 3 times per week.  She has not been having any nausea or vomiting but does have terrible diarrhea.  She reports that every time she eats she has an urgent bowel movement and often is able to make it.  She describes the stools as quite loose and watery.  She is also having some "nervous shaking".  She notes unfortunately she recently went to the bathroom without her walker and fell.  She reports that she feels like she is eating well but not as much as she  used to.  She is otherwise had no major changes in her health.  She denies any overt signs of bleeding, bruising, or dark stools.  She otherwise denies any fevers, chills, sweats, nausea, vomiting or diarrhea.  Full 10 point ROS is listed below.  MEDICAL HISTORY:  Past Medical History:  Diagnosis Date   Anemia    iron infusions   Arthritis    CAD (coronary artery disease)    a. s/p CABG 2008.   Chest pain    Deep vein thrombosis (HCC)    Diabetes mellitus    GERD (gastroesophageal reflux disease)    Hiatal hernia    Hypercholesterolemia    Hyperlipidemia    Hypertension    Hypothyroid    Obesity    Pleural effusion    Uterine cancer (HCC)     SURGICAL HISTORY: Past Surgical History:  Procedure Laterality Date   ABDOMINAL HYSTERECTOMY     bilateral feet surgery     CATARACT EXTRACTION, BILATERAL     CORONARY ARTERY BYPASS GRAFT     x 4-11'08-Dr. Bartle([facility])   endoscopic vein harvesting from both thighs     Evelene Croon MD   EP IMPLANTABLE DEVICE N/A 08/10/2015   Procedure: Pacemaker Implant;  Surgeon: Will Jorja Loa, MD;  Location: [facility];  Service: Cardiovascular;  Laterality: N/A;   excision cyst debridement fusion distal interphalangeal joint ring finger  with Mini Acutrak screw 20 mm in length     EYE SURGERY     corneal transplant -right   hysterectomy  01/07/1998   dx of uterine cancer   JOINT REPLACEMENT     right knee replacement-Dr. supple   LAPAROSCOPIC RIGHT HEMI COLECTOMY Right 10/13/2020   Procedure: LAPAROSCOPIC RIGHT HEMI COLECTOMY;  Surgeon: Andria Meuse, MD;  Location: [facility];  Service: General;  Laterality: Right;   surgery on 2 fingers     r HAND   TOTAL KNEE ARTHROPLASTY Left 03/16/2012   Procedure: TOTAL KNEE ARTHROPLASTY;  Surgeon: Loanne Drilling, MD;  Location: [facility];  Service: Orthopedics;  Laterality: Left;    SOCIAL HISTORY: Social History   Socioeconomic History   Marital status: Married    Spouse name: Not  on file   Number of children: 3   Years of education: Not on file   Highest education level: Not on file  Occupational History   Occupation: retired    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  Tobacco Use   Smoking status: Never   Smokeless tobacco: Never  Vaping Use   Vaping Use: Never used  Substance and Sexual Activity   Alcohol use: No   Drug use: No   Sexual activity: Yes  Other Topics Concern   Not on file  Social History Narrative   Not on file   Social Determinants of Health   Financial Resource Strain: Not on file  Food Insecurity: Not on file  Transportation Needs: Not on file  Physical Activity: Not on file  Stress: Not on file  Social Connections: Not on file  Intimate Partner Violence: Not on file    FAMILY HISTORY: Family History  Problem Relation Age of Onset   Cancer Mother        type unknown   Diabetes Mother    Blindness Brother        partial from menigitis   Hyperlipidemia Daughter    Thyroid disease Daughter    Colon cancer Neg Hx     ALLERGIES:  is allergic to ace inhibitors, statins, tramadol hcl, losartan, and tape.  MEDICATIONS:  Current Outpatient Medications  Medication Sig Dispense Refill   Accu-Chek Softclix Lancets lancets 1 each by Other route as needed.     amLODipine (NORVASC) 10 MG tablet Take 10 mg by mouth daily.     Cholecalciferol (VITAMIN D) 50 MCG (2000 UT) CAPS Take 2,000 Units by mouth daily.     fenofibrate (TRICOR) 145 MG tablet Take 145 mg by mouth daily after breakfast.     ferrous sulfate 325 (65 FE) MG tablet Take 325 mg by mouth daily.     hydrALAZINE (APRESOLINE) 25 MG tablet Take 50 mg by mouth in the morning and at bedtime.     hydrochlorothiazide (HYDRODIURIL) 12.5 MG tablet Take 12.5 mg by mouth daily after breakfast.      levothyroxine (SYNTHROID) 125 MCG tablet Take 125 mcg by mouth daily before breakfast.     nitroGLYCERIN (NITROSTAT) 0.4 MG SL tablet Place 1 tablet (0.4 mg total) under the tongue  every 5 (five) minutes as needed for chest pain. 25 tablet 3   Omega-3 Fatty Acids (FISH OIL PO) Take 1 tablet by mouth daily.     omeprazole (PRILOSEC) 20 MG capsule Take 20 mg by mouth daily after breakfast.     prednisoLONE acetate (PRED FORTE) 1 % ophthalmic suspension Place 1 drop into both eyes every morning.     sertraline (ZOLOFT) 100 MG tablet Take 100 mg by mouth daily.     TRUE METRIX BLOOD GLUCOSE TEST test strip 1  each by Other route daily.     No current facility-administered medications for this visit.    REVIEW OF SYSTEMS:   Constitutional: ( - ) fevers, ( - )  chills , ( - ) night sweats Eyes: ( - ) blurriness of vision, ( - ) double vision, ( - ) watery eyes Ears, nose, mouth, throat, and face: ( - ) mucositis, ( - ) sore throat Respiratory: ( - ) cough, ( - ) dyspnea, ( - ) wheezes Cardiovascular: ( - ) palpitation, ( - ) chest discomfort, ( - ) lower extremity swelling Gastrointestinal:  ( - ) nausea, ( - ) heartburn, ( - ) change in bowel habits Skin: ( - ) abnormal skin rashes Lymphatics: ( - ) new lymphadenopathy, ( - ) easy bruising Neurological: ( - ) numbness, ( - ) tingling, ( - ) new weaknesses Behavioral/Psych: ( - ) mood change, ( - ) new changes  All other systems were reviewed with the patient and are negative.  PHYSICAL EXAMINATION: ECOG PERFORMANCE STATUS: 1 - Symptomatic but completely ambulatory  Vitals:   04/18/22 1446  BP: 139/64  Pulse: 84  Resp: 15  Temp: 97.7 F (36.5 C)  SpO2: 98%      Filed Weights   04/18/22 1446  Weight: 118 lb 14.4 oz (53.9 kg)       GENERAL: Well-appearing elderly Caucasian female, alert, no distress and comfortable SKIN: skin color, texture, turgor are normal, no rashes or significant lesions EYES: conjunctiva are pink and non-injected, sclera clear LUNGS: clear to auscultation and percussion with normal breathing effort HEART: regular rate & rhythm and no murmurs and no lower extremity  edema Musculoskeletal: no cyanosis of digits and no clubbing  PSYCH: alert & oriented x 3, fluent speech NEURO: no focal motor/sensory deficits  LABORATORY DATA:  I have reviewed the data as listed    Latest Ref Rng & Units 04/18/2022    2:21 PM 02/06/2022    2:20 PM 07/27/2021   11:13 AM  CBC  WBC 4.0 - 10.5 K/uL 10.2  12.6  7.1   Hemoglobin 12.0 - 15.0 g/dL 62.112.3  30.811.3  65.711.5   Hematocrit 36.0 - 46.0 % 37.2  33.8  35.3   Platelets 150 - 400 K/uL 364  378  267        Latest Ref Rng & Units 04/18/2022    2:21 PM 02/06/2022    2:20 PM 07/27/2021   11:13 AM  CMP  Glucose 70 - 99 mg/dL 846182  962114  952120   BUN 8 - 23 mg/dL 35  37  24   Creatinine 0.44 - 1.00 mg/dL 8.411.46  3.241.33  4.011.02   Sodium 135 - 145 mmol/L 139  138  139   Potassium 3.5 - 5.1 mmol/L 4.1  3.8  3.9   Chloride 98 - 111 mmol/L 102  101  105   CO2 22 - 32 mmol/L 28  27  28    Calcium 8.9 - 10.3 mg/dL 02.710.5  9.9  9.7   Total Protein 6.5 - 8.1 g/dL 7.4  7.6  6.8   Total Bilirubin 0.3 - 1.2 mg/dL 0.5  0.6  0.5   Alkaline Phos 38 - 126 U/L 52  38  36   AST 15 - 41 U/L 51  50  26   ALT 0 - 44 U/L 24  29  14      No results found for: "MPROTEIN" No results found for: "KPAFRELGTCHN", "LAMBDASER", "KAPLAMBRATIO"  Pathology:    RADIOGRAPHIC STUDIES: I have personally reviewed the radiological images as listed and agreed with the findings in the report: No overt signs of metastatic disease outside the colon. No results found.  ASSESSMENT & PLAN Diana Wood 89 y.o. female with medical history significant for newly diagnosed adenocarcinoma of the colon who presents for a follow up visit.   #Adenocarcinoma of the Colon, Stage I ( pT2, pN0, M0)  -- CT chest abdomen pelvis performed on 09/21/2020 shows no evidence of metastatic disease.  Appears to be a single lesion in the transverse colon. --s/p resection of the tumor on 10/13/2020, pathology confirms stage I disease. --guidelines recommend f/u colonoscopy in 1 years time.  It  was discussed with Dr. Marina Goodell and due to the patient's advanced age they decided not to perform this procedure. --no need for routine imaging or tumor markers in blood.  --continue to follow for iron deficiency.  # Iron Deficiency Anemia 2/2 to GI Bleeding -- Patient has been on p.o. iron therapy but had to stop due to her diarrhea.  We requested that she restart the therapy today. --on 8/19 and 8/26 the patient received IV Feraheme 510 mg q. 7 days x 2 doses --Etiology of her iron deficiency is likely the adenocarcinoma of the colon. Can consider alternative etiologies if her symptoms persist.  --WBC 10.2, Hgb 12.3, MCV 90.5, Plt 364 --RTC in 6 months   No orders of the defined types were placed in this encounter.  All questions were answered. The patient knows to call the clinic with any problems, questions or concerns.  A total of more than 25 minutes were spent on this encounter with face-to-face time and non-face-to-face time, including preparing to see the patient, ordering tests and/or medications, counseling the patient and coordination of care as outlined above.   Ulysees Barns, MD Department of Hematology/Oncology [facility] Phone: [phone] Pager: [phone] Email: [email or web address]  04/18/2022 3:26 PM

## 2022-05-02 ENCOUNTER — Ambulatory Visit: Payer: Medicare PPO | Admitting: Hematology and Oncology

## 2022-05-02 ENCOUNTER — Other Ambulatory Visit: Payer: Medicare PPO

## 2022-05-10 DIAGNOSIS — E559 Vitamin D deficiency, unspecified: Secondary | ICD-10-CM | POA: Diagnosis not present

## 2022-05-10 DIAGNOSIS — E538 Deficiency of other specified B group vitamins: Secondary | ICD-10-CM | POA: Diagnosis not present

## 2022-05-10 DIAGNOSIS — D509 Iron deficiency anemia, unspecified: Secondary | ICD-10-CM | POA: Diagnosis not present

## 2022-05-10 DIAGNOSIS — K219 Gastro-esophageal reflux disease without esophagitis: Secondary | ICD-10-CM | POA: Diagnosis not present

## 2022-05-10 DIAGNOSIS — E785 Hyperlipidemia, unspecified: Secondary | ICD-10-CM | POA: Diagnosis not present

## 2022-05-10 DIAGNOSIS — E039 Hypothyroidism, unspecified: Secondary | ICD-10-CM | POA: Diagnosis not present

## 2022-05-10 DIAGNOSIS — E119 Type 2 diabetes mellitus without complications: Secondary | ICD-10-CM | POA: Diagnosis not present

## 2022-05-10 DIAGNOSIS — I700 Atherosclerosis of aorta: Secondary | ICD-10-CM | POA: Diagnosis not present

## 2022-05-15 DIAGNOSIS — N1831 Chronic kidney disease, stage 3a: Secondary | ICD-10-CM | POA: Diagnosis not present

## 2022-05-15 DIAGNOSIS — Z0000 Encounter for general adult medical examination without abnormal findings: Secondary | ICD-10-CM | POA: Diagnosis not present

## 2022-05-15 DIAGNOSIS — I2510 Atherosclerotic heart disease of native coronary artery without angina pectoris: Secondary | ICD-10-CM | POA: Diagnosis not present

## 2022-05-15 DIAGNOSIS — R82998 Other abnormal findings in urine: Secondary | ICD-10-CM | POA: Diagnosis not present

## 2022-05-15 DIAGNOSIS — I119 Hypertensive heart disease without heart failure: Secondary | ICD-10-CM | POA: Diagnosis not present

## 2022-05-15 DIAGNOSIS — I700 Atherosclerosis of aorta: Secondary | ICD-10-CM | POA: Diagnosis not present

## 2022-05-15 DIAGNOSIS — F03A0 Unspecified dementia, mild, without behavioral disturbance, psychotic disturbance, mood disturbance, and anxiety: Secondary | ICD-10-CM | POA: Diagnosis not present

## 2022-05-15 DIAGNOSIS — Z8542 Personal history of malignant neoplasm of other parts of uterus: Secondary | ICD-10-CM | POA: Diagnosis not present

## 2022-05-15 DIAGNOSIS — D692 Other nonthrombocytopenic purpura: Secondary | ICD-10-CM | POA: Diagnosis not present

## 2022-05-15 DIAGNOSIS — E119 Type 2 diabetes mellitus without complications: Secondary | ICD-10-CM | POA: Diagnosis not present

## 2022-05-15 DIAGNOSIS — R2689 Other abnormalities of gait and mobility: Secondary | ICD-10-CM | POA: Diagnosis not present

## 2022-05-21 ENCOUNTER — Ambulatory Visit (INDEPENDENT_AMBULATORY_CARE_PROVIDER_SITE_OTHER): Payer: Medicare Other

## 2022-05-21 DIAGNOSIS — I442 Atrioventricular block, complete: Secondary | ICD-10-CM

## 2022-05-21 LAB — CUP PACEART REMOTE DEVICE CHECK
Battery Remaining Longevity: 29 mo
Battery Remaining Percentage: 25 %
Battery Voltage: 2.92 V
Brady Statistic AP VP Percent: 42 %
Brady Statistic AP VS Percent: 1 %
Brady Statistic AS VP Percent: 58 %
Brady Statistic AS VS Percent: 1 %
Brady Statistic RA Percent Paced: 41 %
Brady Statistic RV Percent Paced: 99 %
Date Time Interrogation Session: 20240514020019
Implantable Lead Connection Status: 753985
Implantable Lead Connection Status: 753985
Implantable Lead Implant Date: 20170803
Implantable Lead Implant Date: 20170803
Implantable Lead Location: 753859
Implantable Lead Location: 753860
Implantable Pulse Generator Implant Date: 20170803
Lead Channel Impedance Value: 380 Ohm
Lead Channel Impedance Value: 450 Ohm
Lead Channel Pacing Threshold Amplitude: 0.5 V
Lead Channel Pacing Threshold Amplitude: 0.625 V
Lead Channel Pacing Threshold Pulse Width: 0.5 ms
Lead Channel Pacing Threshold Pulse Width: 0.5 ms
Lead Channel Sensing Intrinsic Amplitude: 0.8 mV
Lead Channel Sensing Intrinsic Amplitude: 2.3 mV
Lead Channel Setting Pacing Amplitude: 0.875
Lead Channel Setting Pacing Amplitude: 2 V
Lead Channel Setting Pacing Pulse Width: 0.5 ms
Lead Channel Setting Sensing Sensitivity: 2 mV
Pulse Gen Model: 2272
Pulse Gen Serial Number: 7929478

## 2022-05-27 DIAGNOSIS — H5212 Myopia, left eye: Secondary | ICD-10-CM | POA: Diagnosis not present

## 2022-05-27 DIAGNOSIS — H35033 Hypertensive retinopathy, bilateral: Secondary | ICD-10-CM | POA: Diagnosis not present

## 2022-05-27 DIAGNOSIS — H3530 Unspecified macular degeneration: Secondary | ICD-10-CM | POA: Diagnosis not present

## 2022-05-27 DIAGNOSIS — H524 Presbyopia: Secondary | ICD-10-CM | POA: Diagnosis not present

## 2022-05-27 DIAGNOSIS — H52223 Regular astigmatism, bilateral: Secondary | ICD-10-CM | POA: Diagnosis not present

## 2022-05-27 DIAGNOSIS — H5201 Hypermetropia, right eye: Secondary | ICD-10-CM | POA: Diagnosis not present

## 2022-05-27 DIAGNOSIS — H353131 Nonexudative age-related macular degeneration, bilateral, early dry stage: Secondary | ICD-10-CM | POA: Diagnosis not present

## 2022-05-27 DIAGNOSIS — H33101 Unspecified retinoschisis, right eye: Secondary | ICD-10-CM | POA: Diagnosis not present

## 2022-05-27 DIAGNOSIS — Z961 Presence of intraocular lens: Secondary | ICD-10-CM | POA: Diagnosis not present

## 2022-05-27 DIAGNOSIS — E119 Type 2 diabetes mellitus without complications: Secondary | ICD-10-CM | POA: Diagnosis not present

## 2022-05-27 DIAGNOSIS — Z9849 Cataract extraction status, unspecified eye: Secondary | ICD-10-CM | POA: Diagnosis not present

## 2022-05-27 DIAGNOSIS — H53143 Visual discomfort, bilateral: Secondary | ICD-10-CM | POA: Diagnosis not present

## 2022-06-18 NOTE — Progress Notes (Signed)
Remote pacemaker transmission.   

## 2022-06-21 ENCOUNTER — Ambulatory Visit: Payer: Medicare Other | Admitting: Cardiovascular Disease

## 2022-08-07 NOTE — Progress Notes (Signed)
Cardiology Office Note    Date:  08/12/2022  ID:  Khya, Halls 08/14/1932, MRN [id number] PCP:  Creola Corn, MD  Cardiologist:  Dr. [name]    Chief Complaint: None f/u CAD and AV block   History of Present Illness:   89 y.o. history of CABG 11/2006, HTN, HLD, Hypothyroidism, DVT, GERD and history of uterine cancer Last myovue normal 02/22/16  Developed bradycardia and had a MRI compatible  PPM placed by Dr Elberta Fortis August 2017 Nilda Calamity for thyroid and cholesterol labs   Having some stress with her husband who seems to be more short tempered  No cardiac issues. Grand daughter Billey Gosling 22 years old now   Seen by Dr Elberta Fortis normal PPM function by [facility] 11/20/21 reviewed  Having some balance issues and falling   She had right hemicolectomy with Dr Leeroy Bock October 2022  for invasive adenocarcinoma  Has lost a lot of weight since surgery Husband had PPM placed in October and suffered likely OOH MI, and CVA Follows with Dr Marina Goodell Hb stable 11.5 Defers f/u colonoscopy recommended by Dr Leonides Schanz her Oncologist  Has home health every day and daughter lives 2 minutes from her    Past Medical History:  Diagnosis Date   Anemia    iron infusions   Arthritis    CAD (coronary artery disease)    a. s/p CABG 2008.   Chest pain    Deep vein thrombosis (HCC)    Diabetes mellitus    GERD (gastroesophageal reflux disease)    Hiatal hernia    Hypercholesterolemia    Hyperlipidemia    Hypertension    Hypothyroid    Obesity    Pleural effusion    Uterine cancer (HCC)     Past Surgical History:  Procedure Laterality Date   ABDOMINAL HYSTERECTOMY     bilateral feet surgery     CATARACT EXTRACTION, BILATERAL     CORONARY ARTERY BYPASS GRAFT     x 4-11'08-Dr. Bartle([facility])   endoscopic vein harvesting from both thighs     Evelene Croon MD   EP IMPLANTABLE DEVICE N/A 08/10/2015   Procedure: Pacemaker Implant;  Surgeon: Will Jorja Loa, MD;  Location: [facility];  Service:  Cardiovascular;  Laterality: N/A;   excision cyst debridement fusion distal interphalangeal joint ring finger  with Mini Acutrak screw 20 mm in length     EYE SURGERY     corneal transplant -right   hysterectomy  01/07/1998   dx of uterine cancer   JOINT REPLACEMENT     right knee replacement-Dr. supple   LAPAROSCOPIC RIGHT HEMI COLECTOMY Right 10/13/2020   Procedure: LAPAROSCOPIC RIGHT HEMI COLECTOMY;  Surgeon: Andria Meuse, MD;  Location: [facility];  Service: General;  Laterality: Right;   surgery on 2 fingers     r HAND   TOTAL KNEE ARTHROPLASTY Left 03/16/2012   Procedure: TOTAL KNEE ARTHROPLASTY;  Surgeon: Loanne Drilling, MD;  Location: [facility];  Service: Orthopedics;  Laterality: Left;    Current Medications: Current Outpatient Medications  Medication Sig Dispense Refill   Accu-Chek Softclix Lancets lancets 1 each by Other route as needed.     amLODipine (NORVASC) 10 MG tablet Take 10 mg by mouth daily.     Cholecalciferol (VITAMIN D) 50 MCG (2000 UT) CAPS Take 2,000 Units by mouth daily.     fenofibrate (TRICOR) 145 MG tablet Take 145 mg by mouth daily after breakfast.     ferrous sulfate 325 (65 FE)  MG tablet Take 325 mg by mouth daily.     hydrALAZINE (APRESOLINE) 25 MG tablet Take 50 mg by mouth in the morning and at bedtime.     hydrochlorothiazide (HYDRODIURIL) 12.5 MG tablet Take 12.5 mg by mouth daily after breakfast.      levothyroxine (SYNTHROID) 125 MCG tablet Take 125 mcg by mouth daily before breakfast.     nitroGLYCERIN (NITROSTAT) 0.4 MG SL tablet Place 1 tablet (0.4 mg total) under the tongue every 5 (five) minutes as needed for chest pain. 25 tablet 3   Omega-3 Fatty Acids (FISH OIL PO) Take 1 tablet by mouth daily.     omeprazole (PRILOSEC) 20 MG capsule Take 20 mg by mouth daily after breakfast.     prednisoLONE acetate (PRED FORTE) 1 % ophthalmic suspension Place 1 drop into both eyes every morning.     sertraline (ZOLOFT) 100 MG tablet Take 100 mg by  mouth daily.     TRUE METRIX BLOOD GLUCOSE TEST test strip 1 each by Other route daily.     No current facility-administered medications for this visit.     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  Tobacco Use   Smoking status: Never   Smokeless tobacco: Never  Vaping Use   Vaping status: Never Used  Substance and Sexual Activity   Alcohol use: No   Drug use: No   Sexual activity: Yes  Other Topics Concern   Not on file  Social History Narrative   Not on file   Social Determinants of Health   Financial Resource Strain: Not on file  Food Insecurity: Not on file  Transportation Needs: Not on file  Physical Activity: Not on file  Stress: Not on file  Social Connections: Not on file     Family History:  The patient's family history includes Blindness in her brother; Cancer in her mother; Diabetes in her mother; Hyperlipidemia in her daughter; Thyroid disease in her daughter.   ROS:   Please see the history of present illness.  All other systems are reviewed and otherwise negative.    PHYSICAL EXAM:   VS:  BP (!) 122/54   Pulse 61   Ht 5' (1.524 m)   Wt 122 lb 12.8 oz (55.7 kg)   SpO2 98%   BMI 23.98 kg/m   BMI: Body mass index is 23.98 kg/m. Affect appropriate Healthy:  appears stated age HEENT: normal Neck supple with no adenopathy JVP normal no bruits no thyromegaly Lungs clear with no wheezing and good diaphragmatic motion Heart:  S1/S2 no murmur, no rub, gallop or click PPM under left clavicle  PMI normal pacer under right clavicle  Abdomen: benighn, BS positve, no tenderness, no AAA no bruit.  No HSM or HJR post lab hemi colectomy  Distal  pulses intact with no bruits No edema Neuro non-focal Skin warm and dry No muscular weakness   Wt Readings from Last 3 Encounters:  08/12/22 122 lb 12.8 oz (55.7 kg)  04/18/22 118 lb 14.4 oz (53.9 kg)  02/06/22 118 lb 8 oz (53.8 kg)      Studies/Labs Reviewed:  EKG:   08/04/15 CHB narrow complex escape 02/14/16  P synch pacing LBBB  03/05/17 AV paced with LBBB morphology   Recent Labs: 04/18/2022: ALT 24; BUN 35; Creatinine 1.46; Hemoglobin 12.3; Platelet Count 364; Potassium 4.1; Sodium 139   Lipid Panel   Additional studies/ records that were reviewed today include: Summarized above.    ASSESSMENT & PLAN:   1. PPM:  V pacing 100% of time  f/u EP [place] [employer] device MRI compatible  2. CAD s/p CABG - non ischemic myovue 02/22/16 EF 66%  New nitro called in  3. Chol:  Labs followed by Timothy Lasso on fibrate not sure why not on statin 4. Thyroid:  Synthroid dose reduced f/u labs with Timothy Lasso 5. DM:  Discussed low carb diet.  Target hemoglobin A1c is 6.5 or less.  Continue current medications. 6. Anxiety/Depression: on Zoloft mood seems appropriate  7. Colon Cancer:  post right hemi colectomy f/u Dr Marina Goodell   F/U in a year    Charlton Haws

## 2022-08-12 ENCOUNTER — Ambulatory Visit: Payer: Medicare Other | Attending: Cardiovascular Disease | Admitting: Cardiovascular Disease

## 2022-08-12 ENCOUNTER — Encounter: Payer: Self-pay | Admitting: Cardiovascular Disease

## 2022-08-12 VITALS — BP 122/54 | HR 61 | Ht 60.0 in | Wt 122.8 lb

## 2022-08-12 DIAGNOSIS — Z951 Presence of aortocoronary bypass graft: Secondary | ICD-10-CM | POA: Diagnosis not present

## 2022-08-12 DIAGNOSIS — Z950 Presence of cardiac pacemaker: Secondary | ICD-10-CM | POA: Insufficient documentation

## 2022-08-12 NOTE — Patient Instructions (Addendum)
Medication Instructions:  Your physician recommends that you continue on your current medications as directed. Please refer to the Current Medication list given to you today.  *If you need a refill on your cardiac medications before your next appointment, please call your pharmacy*  Lab Work: If you have labs (blood work) drawn today and your tests are completely normal, you will receive your results only by: MyChart Message (if you have MyChart) OR A paper copy in the mail If you have any lab test that is abnormal or we need to change your treatment, we will call you to review the results.  Testing/Procedures: None ordered today.  Follow-Up: At [facility], you and your health needs are our priority.  As part of our continuing mission to provide you with exceptional heart care, we have created designated Provider Care Teams.  These Care Teams include your primary Cardiologist (physician) and Advanced Practice Providers (APPs -  Physician Assistants and Nurse Practitioners) who all work together to provide you with the care you need, when you need it.  We recommend signing up for the patient portal called "MyChart".  Sign up information is provided on this After Visit Summary.  MyChart is used to connect with patients for Virtual Visits (Telemedicine).  Patients are able to view lab/test results, encounter notes, upcoming appointments, etc.  Non-urgent messages can be sent to your provider as well.   To learn more about what you can do with MyChart, go to [email or web address].    Your next appointment:   6 month(s)  Provider:   Peter Nishan, MD      

## 2022-08-20 ENCOUNTER — Ambulatory Visit (INDEPENDENT_AMBULATORY_CARE_PROVIDER_SITE_OTHER): Payer: Medicare Other

## 2022-08-20 DIAGNOSIS — I442 Atrioventricular block, complete: Secondary | ICD-10-CM | POA: Diagnosis not present

## 2022-09-04 NOTE — Progress Notes (Signed)
Remote pacemaker transmission.   

## 2022-09-26 ENCOUNTER — Telehealth: Payer: Self-pay | Admitting: Hematology and Oncology

## 2022-09-26 DIAGNOSIS — I872 Venous insufficiency (chronic) (peripheral): Secondary | ICD-10-CM | POA: Diagnosis not present

## 2022-09-26 DIAGNOSIS — L570 Actinic keratosis: Secondary | ICD-10-CM | POA: Diagnosis not present

## 2022-09-26 DIAGNOSIS — Z85828 Personal history of other malignant neoplasm of skin: Secondary | ICD-10-CM | POA: Diagnosis not present

## 2022-09-26 DIAGNOSIS — D692 Other nonthrombocytopenic purpura: Secondary | ICD-10-CM | POA: Diagnosis not present

## 2022-09-26 DIAGNOSIS — L304 Erythema intertrigo: Secondary | ICD-10-CM | POA: Diagnosis not present

## 2022-09-26 DIAGNOSIS — I8311 Varicose veins of right lower extremity with inflammation: Secondary | ICD-10-CM | POA: Diagnosis not present

## 2022-09-26 DIAGNOSIS — L821 Other seborrheic keratosis: Secondary | ICD-10-CM | POA: Diagnosis not present

## 2022-09-26 DIAGNOSIS — I8312 Varicose veins of left lower extremity with inflammation: Secondary | ICD-10-CM | POA: Diagnosis not present

## 2022-09-26 DIAGNOSIS — L820 Inflamed seborrheic keratosis: Secondary | ICD-10-CM | POA: Diagnosis not present

## 2022-10-15 ENCOUNTER — Telehealth: Payer: Self-pay | Admitting: Hematology and Oncology

## 2022-10-16 ENCOUNTER — Ambulatory Visit: Payer: Medicare Other | Admitting: Hematology and Oncology

## 2022-10-16 ENCOUNTER — Other Ambulatory Visit: Payer: Medicare Other

## 2022-10-18 DIAGNOSIS — Z23 Encounter for immunization: Secondary | ICD-10-CM | POA: Diagnosis not present

## 2022-10-18 DIAGNOSIS — D509 Iron deficiency anemia, unspecified: Secondary | ICD-10-CM | POA: Diagnosis not present

## 2022-10-18 DIAGNOSIS — R2689 Other abnormalities of gait and mobility: Secondary | ICD-10-CM | POA: Diagnosis not present

## 2022-10-18 DIAGNOSIS — E039 Hypothyroidism, unspecified: Secondary | ICD-10-CM | POA: Diagnosis not present

## 2022-10-18 DIAGNOSIS — F03A0 Unspecified dementia, mild, without behavioral disturbance, psychotic disturbance, mood disturbance, and anxiety: Secondary | ICD-10-CM | POA: Diagnosis not present

## 2022-10-18 DIAGNOSIS — C186 Malignant neoplasm of descending colon: Secondary | ICD-10-CM | POA: Diagnosis not present

## 2022-10-18 DIAGNOSIS — R627 Adult failure to thrive: Secondary | ICD-10-CM | POA: Diagnosis not present

## 2022-10-18 DIAGNOSIS — D649 Anemia, unspecified: Secondary | ICD-10-CM | POA: Diagnosis not present

## 2022-10-18 DIAGNOSIS — I119 Hypertensive heart disease without heart failure: Secondary | ICD-10-CM | POA: Diagnosis not present

## 2022-10-18 DIAGNOSIS — N1831 Chronic kidney disease, stage 3a: Secondary | ICD-10-CM | POA: Diagnosis not present

## 2022-10-18 DIAGNOSIS — F325 Major depressive disorder, single episode, in full remission: Secondary | ICD-10-CM | POA: Diagnosis not present

## 2022-10-18 DIAGNOSIS — E119 Type 2 diabetes mellitus without complications: Secondary | ICD-10-CM | POA: Diagnosis not present

## 2022-10-24 ENCOUNTER — Inpatient Hospital Stay: Payer: Medicare Other

## 2022-10-24 ENCOUNTER — Inpatient Hospital Stay: Payer: Medicare Other | Admitting: Hematology and Oncology

## 2022-11-13 ENCOUNTER — Inpatient Hospital Stay (HOSPITAL_BASED_OUTPATIENT_CLINIC_OR_DEPARTMENT_OTHER): Payer: Medicare Other | Admitting: Hematology and Oncology

## 2022-11-13 ENCOUNTER — Inpatient Hospital Stay: Payer: Medicare Other | Attending: Hematology and Oncology

## 2022-11-13 ENCOUNTER — Other Ambulatory Visit: Payer: Self-pay | Admitting: *Deleted

## 2022-11-13 VITALS — BP 142/68 | HR 79 | Temp 98.0°F | Resp 16 | Wt 126.6 lb

## 2022-11-13 DIAGNOSIS — Z821 Family history of blindness and visual loss: Secondary | ICD-10-CM | POA: Diagnosis not present

## 2022-11-13 DIAGNOSIS — D500 Iron deficiency anemia secondary to blood loss (chronic): Secondary | ICD-10-CM | POA: Diagnosis not present

## 2022-11-13 DIAGNOSIS — K922 Gastrointestinal hemorrhage, unspecified: Secondary | ICD-10-CM | POA: Insufficient documentation

## 2022-11-13 DIAGNOSIS — Z90710 Acquired absence of both cervix and uterus: Secondary | ICD-10-CM | POA: Diagnosis not present

## 2022-11-13 DIAGNOSIS — Z8349 Family history of other endocrine, nutritional and metabolic diseases: Secondary | ICD-10-CM | POA: Diagnosis not present

## 2022-11-13 DIAGNOSIS — Z888 Allergy status to other drugs, medicaments and biological substances status: Secondary | ICD-10-CM | POA: Insufficient documentation

## 2022-11-13 DIAGNOSIS — Z809 Family history of malignant neoplasm, unspecified: Secondary | ICD-10-CM | POA: Insufficient documentation

## 2022-11-13 DIAGNOSIS — Z86718 Personal history of other venous thrombosis and embolism: Secondary | ICD-10-CM | POA: Insufficient documentation

## 2022-11-13 DIAGNOSIS — Z833 Family history of diabetes mellitus: Secondary | ICD-10-CM | POA: Insufficient documentation

## 2022-11-13 DIAGNOSIS — Z9049 Acquired absence of other specified parts of digestive tract: Secondary | ICD-10-CM | POA: Diagnosis not present

## 2022-11-13 DIAGNOSIS — C182 Malignant neoplasm of ascending colon: Secondary | ICD-10-CM | POA: Diagnosis not present

## 2022-11-13 DIAGNOSIS — C189 Malignant neoplasm of colon, unspecified: Secondary | ICD-10-CM

## 2022-11-13 DIAGNOSIS — Z79899 Other long term (current) drug therapy: Secondary | ICD-10-CM | POA: Insufficient documentation

## 2022-11-13 LAB — RETIC PANEL
Immature Retic Fract: 6.1 % (ref 2.3–15.9)
RBC.: 3.71 MIL/uL — ABNORMAL LOW (ref 3.87–5.11)
Retic Count, Absolute: 60.8 10*3/uL (ref 19.0–186.0)
Retic Ct Pct: 1.6 % (ref 0.4–3.1)
Reticulocyte Hemoglobin: 33 pg (ref >27.9)

## 2022-11-13 LAB — CMP (CANCER CENTER ONLY)
ALT: 19 U/L (ref 0–44)
AST: 45 U/L — ABNORMAL HIGH (ref 15–41)
Albumin: 4 g/dL (ref 3.5–5.0)
Alkaline Phosphatase: 51 U/L (ref 38–126)
Anion gap: 7 (ref 5–15)
BUN: 44 mg/dL — ABNORMAL HIGH (ref 8–23)
CO2: 27 mmol/L (ref 22–32)
Calcium: 10 mg/dL (ref 8.9–10.3)
Chloride: 103 mmol/L (ref 98–111)
Creatinine: 1.34 mg/dL — ABNORMAL HIGH (ref 0.44–1.00)
GFR, Estimated: 38 mL/min — ABNORMAL LOW (ref >60)
Glucose, Bld: 108 mg/dL — ABNORMAL HIGH (ref 70–99)
Potassium: 4.9 mmol/L (ref 3.5–5.1)
Sodium: 137 mmol/L (ref 135–145)
Total Bilirubin: 0.5 mg/dL (ref <1.2)
Total Protein: 7.1 g/dL (ref 6.5–8.1)

## 2022-11-13 LAB — CBC WITH DIFFERENTIAL (CANCER CENTER ONLY)
Abs Immature Granulocytes: 0.04 10*3/uL (ref 0.00–0.07)
Basophils Absolute: 0.1 10*3/uL (ref 0.0–0.1)
Basophils Relative: 1 %
Eosinophils Absolute: 0.1 10*3/uL (ref 0.0–0.5)
Eosinophils Relative: 1 %
HCT: 34.8 % — ABNORMAL LOW (ref 36.0–46.0)
Hemoglobin: 11.3 g/dL — ABNORMAL LOW (ref 12.0–15.0)
Immature Granulocytes: 0 %
Lymphocytes Relative: 44 %
Lymphs Abs: 4.8 10*3/uL — ABNORMAL HIGH (ref 0.7–4.0)
MCH: 29 pg (ref 26.0–34.0)
MCHC: 32.5 g/dL (ref 30.0–36.0)
MCV: 89.5 fL (ref 80.0–100.0)
Monocytes Absolute: 1 10*3/uL (ref 0.1–1.0)
Monocytes Relative: 10 %
Neutro Abs: 4.7 10*3/uL (ref 1.7–7.7)
Neutrophils Relative %: 44 %
Platelet Count: 362 10*3/uL (ref 150–400)
RBC: 3.89 MIL/uL (ref 3.87–5.11)
RDW: 14.6 % (ref 11.5–15.5)
WBC Count: 10.8 10*3/uL — ABNORMAL HIGH (ref 4.0–10.5)
nRBC: 0 % (ref 0.0–0.2)

## 2022-11-13 LAB — IRON AND IRON BINDING CAPACITY (CC-WL,HP ONLY)
Iron: 89 ug/dL (ref 28–170)
Saturation Ratios: 18 % (ref 10.4–31.8)
TIBC: 508 ug/dL — ABNORMAL HIGH (ref 250–450)
UIBC: 419 ug/dL (ref 148–442)

## 2022-11-13 NOTE — Progress Notes (Signed)
[facility] [phone]   Fax:([phone]  PROGRESS NOTE  Patient Care Team: Creola Corn, MD as PCP - General (Internal Medicine) Regan Lemming, MD as PCP - Electrophysiology (Cardiology) Wendall Stade, MD as PCP - Cardiology (Cardiology)  Hematological/Oncological History # Invasive Adenocarcinoma of the Colon 09/12/2020: mass noted in distal ascending colon. Pathology consistent with adenocarcinoma.  09/20/2020: office visit with Dr. Cliffton Asters of colorectal surgery. Plan to proceed with laparoscopic right hemicolectomy pending results of CT C/A/P. 09/21/2020: CT C/A/P performed shows no evidence of metastatic disease. Only noted to have neoplasm in transverse colon.   # Iron Deficiency Anemia 2/2 to GI Bleed 11/13/2006: WBC 8.2, Hgb 10.9, MCV 86.5, Plt 365 03/19/2012: WBC 11.6, Hgb 9.5, MCV 82.5, Plt 339 05/10/2020: WBC 10.3, Hgb 8.7, MCV 81.5, Plt 357 08/11/2020: establish care with Dr. Leonides Schanz. Hgb 10.2, Ferritin 18, TIBC 568 12/22/2020: WBC 8.0, Hgb 11.8, MCV 87.3, Plt 275 04/18/2022: WBC 10.2, Hgb 12.3, MCV 90.5, Plt 364  Interval History:  Diana Wood 89 y.o. female with medical history significant for  adenocarcinoma of the colon s/p resection and iron deficiency anemia who presents for a follow up visit. The patient's last visit was on 04/18/2022. In the interim since the last visit she has had no major changes in her health.  On exam today Diana Wood is accompanied by her daughter.  She reports her energy levels are quite poor.  She notes her energy is about a 4 out of 10.  She is not having any lightheadedness, dizziness, shortness of breath.  She reports she was taking iron pills but did stop taking these.  She also recently started memantine and melatonin.  She reports she feels tired overall.  She is eating well and eating about red meat 3-4 times per week.  She reports that she has not had any overt signs of bleeding, bruising, or dark stools.  She  notes that she has not had any recent infectious symptoms such as runny nose, sore throat, or cough.  She denies any other major changes in her health.  She denies any overt signs of bleeding, bruising, or dark stools.  She otherwise denies any fevers, chills, sweats, nausea, vomiting or diarrhea.  Full 10 point ROS is listed below.  MEDICAL HISTORY:  Past Medical History:  Diagnosis Date   Anemia    iron infusions   Arthritis    CAD (coronary artery disease)    a. s/p CABG 2008.   Chest pain    Deep vein thrombosis (HCC)    Diabetes mellitus    GERD (gastroesophageal reflux disease)    Hiatal hernia    Hypercholesterolemia    Hyperlipidemia    Hypertension    Hypothyroid    Obesity    Pleural effusion    Uterine cancer (HCC)     SURGICAL HISTORY: Past Surgical History:  Procedure Laterality Date   ABDOMINAL HYSTERECTOMY     bilateral feet surgery     CATARACT EXTRACTION, BILATERAL     CORONARY ARTERY BYPASS GRAFT     x 4-11'08-Dr. Bartle([facility])   endoscopic vein harvesting from both thighs     Evelene Croon MD   EP IMPLANTABLE DEVICE N/A 08/10/2015   Procedure: Pacemaker Implant;  Surgeon: Will Jorja Loa, MD;  Location: [facility];  Service: Cardiovascular;  Laterality: N/A;   excision cyst debridement fusion distal interphalangeal joint ring finger  with Mini Acutrak screw 20 mm in length  EYE SURGERY     corneal transplant -right   hysterectomy  01/07/1998   dx of uterine cancer   JOINT REPLACEMENT     right knee replacement-Dr. supple   LAPAROSCOPIC RIGHT HEMI COLECTOMY Right 10/13/2020   Procedure: LAPAROSCOPIC RIGHT HEMI COLECTOMY;  Surgeon: Andria Meuse, MD;  Location: [facility];  Service: General;  Laterality: Right;   surgery on 2 fingers     r HAND   TOTAL KNEE ARTHROPLASTY Left 03/16/2012   Procedure: TOTAL KNEE ARTHROPLASTY;  Surgeon: Loanne Drilling, MD;  Location: [facility];  Service: Orthopedics;  Laterality: Left;    SOCIAL  HISTORY: Social History   Socioeconomic History   Marital status: Married    Spouse name: Not on file   Number of children: 3   Years of education: Not on file   Highest education level: Not on file  Occupational History   Occupation: retired    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  Tobacco Use   Smoking status: Never   Smokeless tobacco: Never  Vaping Use   Vaping status: Never Used  Substance and Sexual Activity   Alcohol use: No   Drug use: No   Sexual activity: Yes  Other Topics Concern   Not on file  Social History Narrative   Not on file   Social Determinants of Health   Financial Resource Strain: Not on file  Food Insecurity: Not on file  Transportation Needs: Not on file  Physical Activity: Not on file  Stress: Not on file  Social Connections: Not on file  Intimate Partner Violence: Not on file    FAMILY HISTORY: Family History  Problem Relation Age of Onset   Cancer Mother        type unknown   Diabetes Mother    Blindness Brother        partial from menigitis   Hyperlipidemia Daughter    Thyroid disease Daughter    Colon cancer Neg Hx     ALLERGIES:  is allergic to ace inhibitors, statins, tramadol hcl, losartan, and tape.  MEDICATIONS:  Current Outpatient Medications  Medication Sig Dispense Refill   Accu-Chek Softclix Lancets lancets 1 each by Other route as needed.     amLODipine (NORVASC) 10 MG tablet Take 10 mg by mouth daily.     Cholecalciferol (VITAMIN D) 50 MCG (2000 UT) CAPS Take 2,000 Units by mouth daily.     fenofibrate (TRICOR) 145 MG tablet Take 145 mg by mouth daily after breakfast.     ferrous sulfate 325 (65 FE) MG tablet Take 325 mg by mouth daily.     hydrALAZINE (APRESOLINE) 25 MG tablet Take 50 mg by mouth in the morning and at bedtime.     hydrochlorothiazide (HYDRODIURIL) 12.5 MG tablet Take 12.5 mg by mouth daily after breakfast.      levothyroxine (SYNTHROID) 125 MCG tablet Take 125 mcg by mouth daily before  breakfast.     nitroGLYCERIN (NITROSTAT) 0.4 MG SL tablet Place 1 tablet (0.4 mg total) under the tongue every 5 (five) minutes as needed for chest pain. 25 tablet 3   Omega-3 Fatty Acids (FISH OIL PO) Take 1 tablet by mouth daily.     omeprazole (PRILOSEC) 20 MG capsule Take 20 mg by mouth daily after breakfast.     prednisoLONE acetate (PRED FORTE) 1 % ophthalmic suspension Place 1 drop into both eyes every morning.     sertraline (ZOLOFT) 100 MG tablet Take 100 mg by mouth  daily.     TRUE METRIX BLOOD GLUCOSE TEST test strip 1 each by Other route daily.     No current facility-administered medications for this visit.    REVIEW OF SYSTEMS:   Constitutional: ( - ) fevers, ( - )  chills , ( - ) night sweats Eyes: ( - ) blurriness of vision, ( - ) double vision, ( - ) watery eyes Ears, nose, mouth, throat, and face: ( - ) mucositis, ( - ) sore throat Respiratory: ( - ) cough, ( - ) dyspnea, ( - ) wheezes Cardiovascular: ( - ) palpitation, ( - ) chest discomfort, ( - ) lower extremity swelling Gastrointestinal:  ( - ) nausea, ( - ) heartburn, ( - ) change in bowel habits Skin: ( - ) abnormal skin rashes Lymphatics: ( - ) new lymphadenopathy, ( - ) easy bruising Neurological: ( - ) numbness, ( - ) tingling, ( - ) new weaknesses Behavioral/Psych: ( - ) mood change, ( - ) new changes  All other systems were reviewed with the patient and are negative.  PHYSICAL EXAMINATION: ECOG PERFORMANCE STATUS: 1 - Symptomatic but completely ambulatory  Vitals:   11/13/22 1536  BP: (!) 142/68  Pulse: 79  Resp: 16  Temp: 98 F (36.7 C)  SpO2: 97%      Filed Weights   11/13/22 1536  Weight: 126 lb 9.6 oz (57.4 kg)       GENERAL: Well-appearing elderly Caucasian female, alert, no distress and comfortable SKIN: skin color, texture, turgor are normal, no rashes or significant lesions EYES: conjunctiva are pink and non-injected, sclera clear LUNGS: clear to auscultation and percussion with  normal breathing effort HEART: regular rate & rhythm and no murmurs and no lower extremity edema Musculoskeletal: no cyanosis of digits and no clubbing  PSYCH: alert & oriented x 3, fluent speech NEURO: no focal motor/sensory deficits  LABORATORY DATA:  I have reviewed the data as listed    Latest Ref Rng & Units 11/13/2022    3:16 PM 04/18/2022    2:21 PM 02/06/2022    2:20 PM  CBC  WBC 4.0 - 10.5 K/uL 10.8  10.2  12.6   Hemoglobin 12.0 - 15.0 g/dL 16.1  09.6  04.5   Hematocrit 36.0 - 46.0 % 34.8  37.2  33.8   Platelets 150 - 400 K/uL 362  364  378        Latest Ref Rng & Units 11/13/2022    3:16 PM 04/18/2022    2:21 PM 02/06/2022    2:20 PM  CMP  Glucose 70 - 99 mg/dL 409  811  914   BUN 8 - 23 mg/dL 44  35  37   Creatinine 0.44 - 1.00 mg/dL 7.82  9.56  2.13   Sodium 135 - 145 mmol/L 137  139  138   Potassium 3.5 - 5.1 mmol/L 4.9  4.1  3.8   Chloride 98 - 111 mmol/L 103  102  101   CO2 22 - 32 mmol/L 27  28  27    Calcium 8.9 - 10.3 mg/dL 08.6  57.8  9.9   Total Protein 6.5 - 8.1 g/dL 7.1  7.4  7.6   Total Bilirubin <1.2 mg/dL 0.5  0.5  0.6   Alkaline Phos 38 - 126 U/L 51  52  38   AST 15 - 41 U/L 45  51  50   ALT 0 - 44 U/L 19  24  29  No results found for: "MPROTEIN" No results found for: "KPAFRELGTCHN", "LAMBDASER", "KAPLAMBRATIO"   Pathology:    RADIOGRAPHIC STUDIES: I have personally reviewed the radiological images as listed and agreed with the findings in the report: No overt signs of metastatic disease outside the colon. No results found.  ASSESSMENT & PLAN Diana Wood 89 y.o. female with medical history significant for newly diagnosed adenocarcinoma of the colon who presents for a follow up visit.   #Adenocarcinoma of the Colon, Stage I ( pT2, pN0, M0)  -- CT chest abdomen pelvis performed on 09/21/2020 shows no evidence of metastatic disease.  Appears to be a single lesion in the transverse colon. --s/p resection of the tumor on 10/13/2020, pathology  confirms stage I disease. --guidelines recommend f/u colonoscopy in 1 years time.  It was discussed with Dr. Marina Goodell and due to the patient's advanced age they decided not to perform this procedure. --no need for routine imaging or tumor markers in blood.  --continue to follow for iron deficiency.  # Iron Deficiency Anemia 2/2 to GI Bleeding -- Patient has been on p.o. iron therapy but had to stop due to her diarrhea.  We requested that she restart the therapy today. --on 8/19 and 8/26 the patient received IV Feraheme 510 mg q. 7 days x 2 doses --Etiology of her iron deficiency is likely the adenocarcinoma of the colon. Can consider alternative etiologies if her symptoms persist.  --WBC 10.8, Hgb 11.3, MCV 89.5, Plt 362 --RTC following with her primary care provider rather than follow-up with hematology.  We are happy to see her back if she develops new or any worsening hematological issues  No orders of the defined types were placed in this encounter.  All questions were answered. The patient knows to call the clinic with any problems, questions or concerns.  A total of more than 25 minutes were spent on this encounter with face-to-face time and non-face-to-face time, including preparing to see the patient, ordering tests and/or medications, counseling the patient and coordination of care as outlined above.   Ulysees Barns, MD Department of Hematology/Oncology [facility] Phone: [phone] Pager: [phone] Email: [email or web address]  11/13/2022 4:03 PM

## 2022-11-14 LAB — FERRITIN: Ferritin: 141 ng/mL (ref 11–307)

## 2022-11-19 ENCOUNTER — Ambulatory Visit (INDEPENDENT_AMBULATORY_CARE_PROVIDER_SITE_OTHER): Payer: Medicare Other

## 2022-11-19 DIAGNOSIS — I442 Atrioventricular block, complete: Secondary | ICD-10-CM | POA: Diagnosis not present

## 2022-11-20 LAB — CUP PACEART REMOTE DEVICE CHECK
Battery Remaining Longevity: 23 mo
Battery Remaining Percentage: 19 %
Battery Voltage: 2.9 V
Brady Statistic AP VP Percent: 41 %
Brady Statistic AP VS Percent: 1 %
Brady Statistic AS VP Percent: 59 %
Brady Statistic AS VS Percent: 1 %
Brady Statistic RA Percent Paced: 40 %
Brady Statistic RV Percent Paced: 99 %
Date Time Interrogation Session: 20241112020024
Implantable Lead Connection Status: 753985
Implantable Lead Connection Status: 753985
Implantable Lead Implant Date: 20170803
Implantable Lead Implant Date: 20170803
Implantable Lead Location: 753859
Implantable Lead Location: 753860
Implantable Pulse Generator Implant Date: 20170803
Lead Channel Impedance Value: 380 Ohm
Lead Channel Impedance Value: 440 Ohm
Lead Channel Pacing Threshold Amplitude: 0.5 V
Lead Channel Pacing Threshold Amplitude: 0.625 V
Lead Channel Pacing Threshold Pulse Width: 0.5 ms
Lead Channel Pacing Threshold Pulse Width: 0.5 ms
Lead Channel Sensing Intrinsic Amplitude: 1.3 mV
Lead Channel Sensing Intrinsic Amplitude: 9.1 mV
Lead Channel Setting Pacing Amplitude: 0.875
Lead Channel Setting Pacing Amplitude: 2 V
Lead Channel Setting Pacing Pulse Width: 0.5 ms
Lead Channel Setting Sensing Sensitivity: 2 mV
Pulse Gen Model: 2272
Pulse Gen Serial Number: 7929478

## 2022-11-28 DIAGNOSIS — Z87440 Personal history of urinary (tract) infections: Secondary | ICD-10-CM | POA: Diagnosis not present

## 2022-11-28 DIAGNOSIS — R410 Disorientation, unspecified: Secondary | ICD-10-CM | POA: Diagnosis not present

## 2022-12-17 NOTE — Progress Notes (Signed)
Remote pacemaker transmission.   

## 2023-02-02 NOTE — Progress Notes (Unsigned)
  Electrophysiology Office Note:   ID:  Hagen, Tidd 30-Apr-1932, MRN [id number]  Primary Cardiologist: Charlton Haws, MD Electrophysiologist: Regan Lemming, MD CABG 11/2006, HTN, HLD, Hypothyroidism, DVT, GERD and history of uterine cancer  {Click to update primary MD,subspecialty MD or APP then REFRESH:1}    History of Present Illness:   Diana Wood is a 89 y.o. female with h/o CABG 11/2006, HTN, HLD, Hypothyroidism, DVT, GERD and history of uterine cancer seen today for routine electrophysiology followup.   Since last being seen in our clinic the patient reports doing ***.  she denies chest pain, palpitations, dyspnea, PND, orthopnea, nausea, vomiting, dizziness, syncope, edema, weight gain, or early satiety.   Review of systems complete and found to be negative unless listed in HPI.   EP Information / Studies Reviewed:    EKG is ordered today. Personal review as below.       PPM Interrogation-  reviewed in detail today,  See PACEART report.  Arrhythmia/Device History Dual Chamber Abbot PPM placed 08/2015 for heart block/bradycardia    Physical Exam:   VS:  There were no vitals taken for this visit.   Wt Readings from Last 3 Encounters:  11/13/22 126 lb 9.6 oz (57.4 kg)  08/12/22 122 lb 12.8 oz (55.7 kg)  04/18/22 118 lb 14.4 oz (53.9 kg)     GEN: No acute distress  NECK: No JVD; No carotid bruits CARDIAC: {EPRHYTHM:28826}, no murmurs, rubs, gallops RESPIRATORY:  Clear to auscultation without rales, wheezing or rhonchi  ABDOMEN: Soft, non-tender, non-distended EXTREMITIES:  {EDEMA LEVEL:28147::"No"} edema; No deformity   ASSESSMENT AND PLAN:    Symptomatic bradycardia s/p Abbott PPM  Normal PPM function See Pace Art report No changes today  CAD s/p CABG Denies s/s ischemia  DM2 Hypothyroidism Anxiety Depression Per PCP   HTN Stable on current regimen   {Click here to Review PMH, Prob List, Meds, Allergies, SHx, FHx  :1}   Disposition:   Follow up  with {EPPROVIDERS:28135} {EPFOLLOW UP:28173}  Signed, Graciella Freer, PA-C

## 2023-02-03 ENCOUNTER — Other Ambulatory Visit (HOSPITAL_COMMUNITY): Payer: Self-pay

## 2023-02-03 ENCOUNTER — Other Ambulatory Visit: Payer: Self-pay | Admitting: *Deleted

## 2023-02-03 ENCOUNTER — Encounter: Payer: Self-pay | Admitting: Student

## 2023-02-03 ENCOUNTER — Ambulatory Visit: Payer: Medicare Other | Attending: Student | Admitting: Student

## 2023-02-03 VITALS — BP 115/72 | HR 74 | Ht <= 58 in | Wt 127.0 lb

## 2023-02-03 DIAGNOSIS — I442 Atrioventricular block, complete: Secondary | ICD-10-CM | POA: Insufficient documentation

## 2023-02-03 DIAGNOSIS — E119 Type 2 diabetes mellitus without complications: Secondary | ICD-10-CM | POA: Diagnosis not present

## 2023-02-03 DIAGNOSIS — I10 Essential (primary) hypertension: Secondary | ICD-10-CM | POA: Insufficient documentation

## 2023-02-03 DIAGNOSIS — Z951 Presence of aortocoronary bypass graft: Secondary | ICD-10-CM | POA: Diagnosis not present

## 2023-02-03 LAB — CUP PACEART INCLINIC DEVICE CHECK
Battery Remaining Longevity: 19 mo
Battery Voltage: 2.89 V
Brady Statistic RA Percent Paced: 39 %
Brady Statistic RV Percent Paced: 99.75 %
Date Time Interrogation Session: 20250127123645
Implantable Lead Connection Status: 753985
Implantable Lead Connection Status: 753985
Implantable Lead Implant Date: 20170803
Implantable Lead Implant Date: 20170803
Implantable Lead Location: 753859
Implantable Lead Location: 753860
Implantable Pulse Generator Implant Date: 20170803
Lead Channel Impedance Value: 400 Ohm
Lead Channel Impedance Value: 437.5 Ohm
Lead Channel Pacing Threshold Amplitude: 0.5 V
Lead Channel Pacing Threshold Amplitude: 0.5 V
Lead Channel Pacing Threshold Amplitude: 0.625 V
Lead Channel Pacing Threshold Pulse Width: 0.5 ms
Lead Channel Pacing Threshold Pulse Width: 0.5 ms
Lead Channel Pacing Threshold Pulse Width: 0.5 ms
Lead Channel Sensing Intrinsic Amplitude: 2.2 mV
Lead Channel Sensing Intrinsic Amplitude: 5.1 mV
Lead Channel Setting Pacing Amplitude: 0.875
Lead Channel Setting Pacing Amplitude: 2 V
Lead Channel Setting Pacing Pulse Width: 0.5 ms
Lead Channel Setting Sensing Sensitivity: 2 mV
Pulse Gen Model: 2272
Pulse Gen Serial Number: 7929478

## 2023-02-03 MED ORDER — NITROGLYCERIN 0.4 MG SL SUBL
0.4000 mg | SUBLINGUAL_TABLET | SUBLINGUAL | 1 refills | Status: DC | PRN
  Filled 2023-02-03: qty 25, 1d supply, fill #0

## 2023-02-03 MED ORDER — NITROGLYCERIN 0.4 MG SL SUBL: 0.4000 mg | SUBLINGUAL_TABLET | SUBLINGUAL | 1 refills | Status: DC | PRN

## 2023-02-03 NOTE — Patient Instructions (Signed)
Medication Instructions:  Your physician recommends that you continue on your current medications as directed. Please refer to the Current Medication list given to you today.  *If you need a refill on your cardiac medications before your next appointment, please call your pharmacy*  Lab Work: None ordered If you have labs (blood work) drawn today and your tests are completely normal, you will receive your results only by: MyChart Message (if you have MyChart) OR A paper copy in the mail If you have any lab test that is abnormal or we need to change your treatment, we will call you to review the results.  Follow-Up: At [facility], you and your health needs are our priority.  As part of our continuing mission to provide you with exceptional heart care, we have created designated Provider Care Teams.  These Care Teams include your primary Cardiologist (physician) and Advanced Practice Providers (APPs -  Physician Assistants and Nurse Practitioners) who all work together to provide you with the care you need, when you need it.  Your next appointment:   1 year(s)  Provider:   Casimiro Needle "Otilio Saber, PA-C

## 2023-02-03 NOTE — Addendum Note (Signed)
Addended by: Valrie Hart on: 02/03/2023 12:32 PM   Modules accepted: Orders

## 2023-02-18 ENCOUNTER — Ambulatory Visit (INDEPENDENT_AMBULATORY_CARE_PROVIDER_SITE_OTHER): Payer: Medicare Other

## 2023-02-18 DIAGNOSIS — I442 Atrioventricular block, complete: Secondary | ICD-10-CM | POA: Diagnosis not present

## 2023-02-20 LAB — CUP PACEART REMOTE DEVICE CHECK
Battery Remaining Longevity: 19 mo
Battery Remaining Percentage: 16 %
Battery Voltage: 2.89 V
Brady Statistic AP VP Percent: 17 %
Brady Statistic AP VS Percent: 1 %
Brady Statistic AS VP Percent: 82 %
Brady Statistic AS VS Percent: 1 %
Brady Statistic RA Percent Paced: 17 %
Brady Statistic RV Percent Paced: 99 %
Date Time Interrogation Session: 20250211020024
Implantable Lead Connection Status: 753985
Implantable Lead Connection Status: 753985
Implantable Lead Implant Date: 20170803
Implantable Lead Implant Date: 20170803
Implantable Lead Location: 753859
Implantable Lead Location: 753860
Implantable Pulse Generator Implant Date: 20170803
Lead Channel Impedance Value: 400 Ohm
Lead Channel Impedance Value: 440 Ohm
Lead Channel Pacing Threshold Amplitude: 0.5 V
Lead Channel Pacing Threshold Amplitude: 0.75 V
Lead Channel Pacing Threshold Pulse Width: 0.5 ms
Lead Channel Pacing Threshold Pulse Width: 0.5 ms
Lead Channel Sensing Intrinsic Amplitude: 1.9 mV
Lead Channel Sensing Intrinsic Amplitude: 6.1 mV
Lead Channel Setting Pacing Amplitude: 1 V
Lead Channel Setting Pacing Amplitude: 2 V
Lead Channel Setting Pacing Pulse Width: 0.5 ms
Lead Channel Setting Sensing Sensitivity: 2 mV
Pulse Gen Model: 2272
Pulse Gen Serial Number: 7929478

## 2023-03-27 DIAGNOSIS — E119 Type 2 diabetes mellitus without complications: Secondary | ICD-10-CM | POA: Diagnosis not present

## 2023-03-27 DIAGNOSIS — R82998 Other abnormal findings in urine: Secondary | ICD-10-CM | POA: Diagnosis not present

## 2023-04-01 NOTE — Progress Notes (Signed)
 Remote pacemaker transmission.

## 2023-04-01 NOTE — Addendum Note (Signed)
 Addended by: Geralyn Flash D on: 04/01/2023 11:31 AM   Modules accepted: Orders

## 2023-04-09 DIAGNOSIS — F02B0 Dementia in other diseases classified elsewhere, moderate, without behavioral disturbance, psychotic disturbance, mood disturbance, and anxiety: Secondary | ICD-10-CM | POA: Diagnosis not present

## 2023-05-06 ENCOUNTER — Emergency Department (HOSPITAL_BASED_OUTPATIENT_CLINIC_OR_DEPARTMENT_OTHER)

## 2023-05-06 ENCOUNTER — Encounter (HOSPITAL_BASED_OUTPATIENT_CLINIC_OR_DEPARTMENT_OTHER): Payer: Self-pay | Admitting: Emergency Medicine

## 2023-05-06 ENCOUNTER — Other Ambulatory Visit: Payer: Self-pay

## 2023-05-06 ENCOUNTER — Emergency Department (HOSPITAL_BASED_OUTPATIENT_CLINIC_OR_DEPARTMENT_OTHER)
Admission: EM | Admit: 2023-05-06 | Discharge: 2023-05-06 | Disposition: A | Discharge status: Home / Self Care | Attending: Emergency Medicine | Admitting: Emergency Medicine

## 2023-05-06 DIAGNOSIS — S0990XA Unspecified injury of head, initial encounter: Secondary | ICD-10-CM | POA: Insufficient documentation

## 2023-05-06 DIAGNOSIS — S0993XA Unspecified injury of face, initial encounter: Secondary | ICD-10-CM | POA: Insufficient documentation

## 2023-05-06 DIAGNOSIS — I6782 Cerebral ischemia: Secondary | ICD-10-CM | POA: Diagnosis not present

## 2023-05-06 DIAGNOSIS — S199XXA Unspecified injury of neck, initial encounter: Secondary | ICD-10-CM | POA: Diagnosis not present

## 2023-05-06 DIAGNOSIS — W19XXXA Unspecified fall, initial encounter: Secondary | ICD-10-CM | POA: Diagnosis not present

## 2023-05-06 DIAGNOSIS — S0083XA Contusion of other part of head, initial encounter: Secondary | ICD-10-CM | POA: Diagnosis not present

## 2023-05-06 NOTE — ED Triage Notes (Signed)
 Pt bib daughter and care giver, endorses unwitnessed mechanical fall 1 week pta. Bruising noted to bilateral cheeks, forehead. Denies thinners. Caregiver stated that pt is experiencing intermittent slurred speech since fall

## 2023-05-06 NOTE — Discharge Instructions (Signed)
 CT head face and neck without any acute abnormalities.  Follow-up with her doctors as needed.

## 2023-05-06 NOTE — ED Notes (Signed)
 Discharge paperwork given and verbally understood.

## 2023-05-06 NOTE — ED Provider Notes (Addendum)
 [place] EMERGENCY DEPARTMENT AT [facility] Provider Note   CSN: [id number] Arrival date & time: 05/06/23  1130     History  Chief Complaint  Patient presents with   Diana Wood    Diana Wood is a 89 y.o. female.  Patient with unwitnessed fall a week ago on Tuesday.  Patient hit the top of her head and face.  They contacted primary care doctor and they recommend she come in for CT head which is appropriate.  They have noticed some intermittent slurred speech since the fall.  The patient talking fine currently and following instructions without any difficulty.  Past medical history significant for hyperlipidemia hypertension coronary artery disease diabetes history of deep vein thrombosis.  Patient is not on any blood thinners according to the family.  Patient has been walking with her walker without any difficulties.  They are not concerned about upper extremity or lower extremity or back injury.       Home Medications Prior to Admission medications   Medication Sig Start Date End Date Taking? Authorizing Provider  Accu-Chek Softclix Lancets lancets 1 each by Other route as needed. 03/27/19   [provider]  amLODipine  (NORVASC ) 10 MG tablet Take 10 mg by mouth daily.    [provider]  Cholecalciferol  (VITAMIN D ) 50 MCG (2000 UT) CAPS Take 2,000 Units by mouth daily.    [provider]  fenofibrate  (TRICOR ) 145 MG tablet Take 145 mg by mouth daily after breakfast.    [provider]  ferrous sulfate  325 (65 FE) MG tablet Take 325 mg by mouth daily. 03/26/18   [provider]  hydrALAZINE  (APRESOLINE ) 25 MG tablet Take 50 mg by mouth in the morning and at bedtime.    [provider]  hydrochlorothiazide  (HYDRODIURIL ) 12.5 MG tablet Take 12.5 mg by mouth daily after breakfast.     [provider]  levothyroxine  (SYNTHROID ) 125 MCG tablet Take 125 mcg by mouth daily before breakfast. 04/07/20   [provider]  memantine  (NAMENDA ) 5 MG tablet Take 5 mg by mouth 2 (two) times daily. 12/15/22   [provider]  nitroGLYCERIN  (NITROSTAT ) 0.4 MG SL tablet Place 1 tablet (0.4 mg total) under the tongue every 5 (five) minutes as needed for chest pain. 02/03/23   Nishan, Peter C, MD  Omega-3 Fatty Acids (FISH OIL PO) Take 1 tablet by mouth daily.    [provider]  omeprazole  (PRILOSEC) 20 MG capsule Take 20 mg by mouth daily after breakfast.    [provider]  prednisoLONE  acetate (PRED FORTE ) 1 % ophthalmic suspension Place 1 drop into both eyes every morning.    [provider]  REXULTI 0.5 MG TABS 0.5 mg once. Patient takes in the Morning 12/03/22   [provider]  sertraline  (ZOLOFT ) 100 MG tablet Take 100 mg by mouth daily. 06/09/20   [provider]  sertraline  (ZOLOFT ) 25 MG tablet Take 150 mg by mouth daily.    [provider]  TRUE METRIX BLOOD GLUCOSE TEST test strip 1 each by Other route daily. 03/27/19   [provider]      Allergies    Ace inhibitors, Statins, Tramadol  hcl, Losartan , and Tape    Review of Systems   Review of Systems  Constitutional:  Negative for chills and fever.  HENT:  Negative for ear pain and sore throat.   Eyes:  Negative for pain and visual disturbance.  Respiratory:  Negative for cough and shortness  of breath.   Cardiovascular:  Negative for chest pain and palpitations.  Gastrointestinal:  Negative for abdominal pain and vomiting.  Genitourinary:  Negative for dysuria and hematuria.  Musculoskeletal:  Negative for arthralgias and back pain.  Skin:  Negative for color change and rash.  Neurological:  Positive for speech difficulty. Negative for seizures and syncope.  All other systems reviewed and are negative.   Physical Exam Updated Vital Signs BP 125/73 (BP Location: Left Arm)   Pulse 60   Temp 97.8 F (36.6 C)   Resp 16   SpO2 99%  Physical Exam Vitals and nursing note  reviewed.  Constitutional:      General: She is not in acute distress.    Appearance: She is well-developed.  HENT:     Head: Normocephalic.     Comments: Oozing to the forehead bruising under both eyes.  Questionable slight deformity to the nose.    Nose:     Comments: No septal hematoma.  No significant deformity of the nose.  No blood in the nares.    Mouth/Throat:     Mouth: Mucous membranes are moist.     Pharynx: Oropharynx is clear.  Eyes:     Extraocular Movements: Extraocular movements intact.     Conjunctiva/sclera: Conjunctivae normal.     Pupils: Pupils are equal, round, and reactive to light.  Cardiovascular:     Rate and Rhythm: Normal rate and regular rhythm.     Heart sounds: No murmur heard. Pulmonary:     Effort: Pulmonary effort is normal. No respiratory distress.     Breath sounds: Normal breath sounds.     Comments: Pacemaker left anterior chest Abdominal:     Palpations: Abdomen is soft.     Tenderness: There is no abdominal tenderness.  Musculoskeletal:        General: No swelling, tenderness, deformity or signs of injury.     Cervical back: Neck supple. No rigidity.     Right lower leg: No edema.     Left lower leg: No edema.  Skin:    General: Skin is warm and dry.     Capillary Refill: Capillary refill takes less than 2 seconds.  Neurological:     General: No focal deficit present.     Mental Status: She is alert and oriented to person, place, and time.     Cranial Nerves: No cranial nerve deficit.     Sensory: No sensory deficit.     Motor: No weakness.  Psychiatric:        Mood and Affect: Mood normal.     ED Results / Procedures / Treatments   Labs (all labs ordered are listed, but only abnormal results are displayed) Labs Reviewed - No data to display  EKG None  Radiology No results found.  Procedures Procedures    Medications Ordered in ED Medications - No data to display  ED Course/ Medical Decision Making/ A&P                                  Medical Decision Making Amount and/or Complexity of Data Reviewed Radiology: ordered.   Patient status post fall greater than 7 days ago.  Will get CT head face and neck.  No concerns for any extremity injuries.  Patient follows commands well.  Talking well here today.  No fever temp is 97.8.  Oxygen sats are 99%.  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  Patient's heart rate here is 66 on the monitor in the room.  Head CT CT cervical spine CT maxillofacial without any acute abnormalities.  Patient stable for discharge home.   Final Clinical Impression(s) / ED Diagnoses Final diagnoses:  Fall, initial encounter  Injury of head, initial encounter  Facial injury, initial encounter    Rx / DC Orders ED Discharge Orders     None         Nicklas Barns, MD 05/06/23 1232    Nicklas Barns, MD 05/06/23 [phone]

## 2023-05-13 DIAGNOSIS — E538 Deficiency of other specified B group vitamins: Secondary | ICD-10-CM | POA: Diagnosis not present

## 2023-05-13 DIAGNOSIS — E039 Hypothyroidism, unspecified: Secondary | ICD-10-CM | POA: Diagnosis not present

## 2023-05-13 DIAGNOSIS — I1310 Hypertensive heart and chronic kidney disease without heart failure, with stage 1 through stage 4 chronic kidney disease, or unspecified chronic kidney disease: Secondary | ICD-10-CM | POA: Diagnosis not present

## 2023-05-13 DIAGNOSIS — E785 Hyperlipidemia, unspecified: Secondary | ICD-10-CM | POA: Diagnosis not present

## 2023-05-13 DIAGNOSIS — F02B0 Dementia in other diseases classified elsewhere, moderate, without behavioral disturbance, psychotic disturbance, mood disturbance, and anxiety: Secondary | ICD-10-CM | POA: Diagnosis not present

## 2023-05-13 DIAGNOSIS — N1831 Chronic kidney disease, stage 3a: Secondary | ICD-10-CM | POA: Diagnosis not present

## 2023-05-13 DIAGNOSIS — D509 Iron deficiency anemia, unspecified: Secondary | ICD-10-CM | POA: Diagnosis not present

## 2023-05-13 DIAGNOSIS — E1122 Type 2 diabetes mellitus with diabetic chronic kidney disease: Secondary | ICD-10-CM | POA: Diagnosis not present

## 2023-05-13 DIAGNOSIS — E559 Vitamin D deficiency, unspecified: Secondary | ICD-10-CM | POA: Diagnosis not present

## 2023-05-14 DIAGNOSIS — E119 Type 2 diabetes mellitus without complications: Secondary | ICD-10-CM | POA: Diagnosis not present

## 2023-05-14 DIAGNOSIS — R410 Disorientation, unspecified: Secondary | ICD-10-CM | POA: Diagnosis not present

## 2023-05-14 DIAGNOSIS — N390 Urinary tract infection, site not specified: Secondary | ICD-10-CM | POA: Diagnosis not present

## 2023-05-14 DIAGNOSIS — R4781 Slurred speech: Secondary | ICD-10-CM | POA: Diagnosis not present

## 2023-05-20 ENCOUNTER — Ambulatory Visit (INDEPENDENT_AMBULATORY_CARE_PROVIDER_SITE_OTHER): Payer: Medicare Other

## 2023-05-20 DIAGNOSIS — Z1331 Encounter for screening for depression: Secondary | ICD-10-CM | POA: Diagnosis not present

## 2023-05-20 DIAGNOSIS — I2510 Atherosclerotic heart disease of native coronary artery without angina pectoris: Secondary | ICD-10-CM | POA: Diagnosis not present

## 2023-05-20 DIAGNOSIS — E785 Hyperlipidemia, unspecified: Secondary | ICD-10-CM | POA: Diagnosis not present

## 2023-05-20 DIAGNOSIS — F03A0 Unspecified dementia, mild, without behavioral disturbance, psychotic disturbance, mood disturbance, and anxiety: Secondary | ICD-10-CM | POA: Diagnosis not present

## 2023-05-20 DIAGNOSIS — N1831 Chronic kidney disease, stage 3a: Secondary | ICD-10-CM | POA: Diagnosis not present

## 2023-05-20 DIAGNOSIS — I442 Atrioventricular block, complete: Secondary | ICD-10-CM

## 2023-05-20 DIAGNOSIS — Z950 Presence of cardiac pacemaker: Secondary | ICD-10-CM | POA: Diagnosis not present

## 2023-05-20 DIAGNOSIS — E119 Type 2 diabetes mellitus without complications: Secondary | ICD-10-CM | POA: Diagnosis not present

## 2023-05-20 DIAGNOSIS — F325 Major depressive disorder, single episode, in full remission: Secondary | ICD-10-CM | POA: Diagnosis not present

## 2023-05-20 DIAGNOSIS — R2689 Other abnormalities of gait and mobility: Secondary | ICD-10-CM | POA: Diagnosis not present

## 2023-05-20 DIAGNOSIS — E039 Hypothyroidism, unspecified: Secondary | ICD-10-CM | POA: Diagnosis not present

## 2023-05-20 DIAGNOSIS — Z0000 Encounter for general adult medical examination without abnormal findings: Secondary | ICD-10-CM | POA: Diagnosis not present

## 2023-05-20 DIAGNOSIS — I119 Hypertensive heart disease without heart failure: Secondary | ICD-10-CM | POA: Diagnosis not present

## 2023-05-20 DIAGNOSIS — C186 Malignant neoplasm of descending colon: Secondary | ICD-10-CM | POA: Diagnosis not present

## 2023-05-20 DIAGNOSIS — Z1389 Encounter for screening for other disorder: Secondary | ICD-10-CM | POA: Diagnosis not present

## 2023-05-21 LAB — CUP PACEART REMOTE DEVICE CHECK
Battery Remaining Longevity: 16 mo
Battery Remaining Percentage: 14 %
Battery Voltage: 2.87 V
Brady Statistic AP VP Percent: 25 %
Brady Statistic AP VS Percent: 1 %
Brady Statistic AS VP Percent: 74 %
Brady Statistic AS VS Percent: 1 %
Brady Statistic RA Percent Paced: 24 %
Brady Statistic RV Percent Paced: 99 %
Date Time Interrogation Session: 20250513020013
Implantable Lead Connection Status: 753985
Implantable Lead Connection Status: 753985
Implantable Lead Implant Date: 20170803
Implantable Lead Implant Date: 20170803
Implantable Lead Location: 753859
Implantable Lead Location: 753860
Implantable Pulse Generator Implant Date: 20170803
Lead Channel Impedance Value: 380 Ohm
Lead Channel Impedance Value: 440 Ohm
Lead Channel Pacing Threshold Amplitude: 0.5 V
Lead Channel Pacing Threshold Amplitude: 0.625 V
Lead Channel Pacing Threshold Pulse Width: 0.5 ms
Lead Channel Pacing Threshold Pulse Width: 0.5 ms
Lead Channel Sensing Intrinsic Amplitude: 1.9 mV
Lead Channel Sensing Intrinsic Amplitude: 8.1 mV
Lead Channel Setting Pacing Amplitude: 0.875
Lead Channel Setting Pacing Amplitude: 2 V
Lead Channel Setting Pacing Pulse Width: 0.5 ms
Lead Channel Setting Sensing Sensitivity: 2 mV
Pulse Gen Model: 2272
Pulse Gen Serial Number: 7929478

## 2023-05-22 ENCOUNTER — Ambulatory Visit: Payer: Self-pay | Admitting: Cardiology

## 2023-05-22 DIAGNOSIS — Z96653 Presence of artificial knee joint, bilateral: Secondary | ICD-10-CM | POA: Diagnosis not present

## 2023-05-22 DIAGNOSIS — Z951 Presence of aortocoronary bypass graft: Secondary | ICD-10-CM | POA: Diagnosis not present

## 2023-05-22 DIAGNOSIS — I8390 Asymptomatic varicose veins of unspecified lower extremity: Secondary | ICD-10-CM | POA: Diagnosis not present

## 2023-05-22 DIAGNOSIS — I700 Atherosclerosis of aorta: Secondary | ICD-10-CM | POA: Diagnosis not present

## 2023-05-22 DIAGNOSIS — E785 Hyperlipidemia, unspecified: Secondary | ICD-10-CM | POA: Diagnosis not present

## 2023-05-22 DIAGNOSIS — I2510 Atherosclerotic heart disease of native coronary artery without angina pectoris: Secondary | ICD-10-CM | POA: Diagnosis not present

## 2023-05-22 DIAGNOSIS — H9190 Unspecified hearing loss, unspecified ear: Secondary | ICD-10-CM | POA: Diagnosis not present

## 2023-05-22 DIAGNOSIS — D500 Iron deficiency anemia secondary to blood loss (chronic): Secondary | ICD-10-CM | POA: Diagnosis not present

## 2023-05-22 DIAGNOSIS — I509 Heart failure, unspecified: Secondary | ICD-10-CM | POA: Diagnosis not present

## 2023-05-22 DIAGNOSIS — E559 Vitamin D deficiency, unspecified: Secondary | ICD-10-CM | POA: Diagnosis not present

## 2023-05-22 DIAGNOSIS — F02A3 Dementia in other diseases classified elsewhere, mild, with mood disturbance: Secondary | ICD-10-CM | POA: Diagnosis not present

## 2023-05-22 DIAGNOSIS — D692 Other nonthrombocytopenic purpura: Secondary | ICD-10-CM | POA: Diagnosis not present

## 2023-05-22 DIAGNOSIS — Z792 Long term (current) use of antibiotics: Secondary | ICD-10-CM | POA: Diagnosis not present

## 2023-05-22 DIAGNOSIS — E1122 Type 2 diabetes mellitus with diabetic chronic kidney disease: Secondary | ICD-10-CM | POA: Diagnosis not present

## 2023-05-22 DIAGNOSIS — I130 Hypertensive heart and chronic kidney disease with heart failure and stage 1 through stage 4 chronic kidney disease, or unspecified chronic kidney disease: Secondary | ICD-10-CM | POA: Diagnosis not present

## 2023-05-22 DIAGNOSIS — N390 Urinary tract infection, site not specified: Secondary | ICD-10-CM | POA: Diagnosis not present

## 2023-05-22 DIAGNOSIS — N1831 Chronic kidney disease, stage 3a: Secondary | ICD-10-CM | POA: Diagnosis not present

## 2023-05-22 DIAGNOSIS — M1990 Unspecified osteoarthritis, unspecified site: Secondary | ICD-10-CM | POA: Diagnosis not present

## 2023-05-22 DIAGNOSIS — E538 Deficiency of other specified B group vitamins: Secondary | ICD-10-CM | POA: Diagnosis not present

## 2023-05-22 DIAGNOSIS — F02A4 Dementia in other diseases classified elsewhere, mild, with anxiety: Secondary | ICD-10-CM | POA: Diagnosis not present

## 2023-05-22 DIAGNOSIS — K760 Fatty (change of) liver, not elsewhere classified: Secondary | ICD-10-CM | POA: Diagnosis not present

## 2023-05-22 DIAGNOSIS — F325 Major depressive disorder, single episode, in full remission: Secondary | ICD-10-CM | POA: Diagnosis not present

## 2023-05-22 DIAGNOSIS — K219 Gastro-esophageal reflux disease without esophagitis: Secondary | ICD-10-CM | POA: Diagnosis not present

## 2023-05-22 DIAGNOSIS — E039 Hypothyroidism, unspecified: Secondary | ICD-10-CM | POA: Diagnosis not present

## 2023-05-26 DIAGNOSIS — F02A3 Dementia in other diseases classified elsewhere, mild, with mood disturbance: Secondary | ICD-10-CM | POA: Diagnosis not present

## 2023-05-26 DIAGNOSIS — F325 Major depressive disorder, single episode, in full remission: Secondary | ICD-10-CM | POA: Diagnosis not present

## 2023-05-26 DIAGNOSIS — F02A4 Dementia in other diseases classified elsewhere, mild, with anxiety: Secondary | ICD-10-CM | POA: Diagnosis not present

## 2023-05-26 DIAGNOSIS — I130 Hypertensive heart and chronic kidney disease with heart failure and stage 1 through stage 4 chronic kidney disease, or unspecified chronic kidney disease: Secondary | ICD-10-CM | POA: Diagnosis not present

## 2023-05-26 DIAGNOSIS — I509 Heart failure, unspecified: Secondary | ICD-10-CM | POA: Diagnosis not present

## 2023-05-26 DIAGNOSIS — E039 Hypothyroidism, unspecified: Secondary | ICD-10-CM | POA: Diagnosis not present

## 2023-05-27 DIAGNOSIS — F02A4 Dementia in other diseases classified elsewhere, mild, with anxiety: Secondary | ICD-10-CM | POA: Diagnosis not present

## 2023-05-27 DIAGNOSIS — F02A3 Dementia in other diseases classified elsewhere, mild, with mood disturbance: Secondary | ICD-10-CM | POA: Diagnosis not present

## 2023-05-27 DIAGNOSIS — E039 Hypothyroidism, unspecified: Secondary | ICD-10-CM | POA: Diagnosis not present

## 2023-05-27 DIAGNOSIS — I509 Heart failure, unspecified: Secondary | ICD-10-CM | POA: Diagnosis not present

## 2023-05-27 DIAGNOSIS — F325 Major depressive disorder, single episode, in full remission: Secondary | ICD-10-CM | POA: Diagnosis not present

## 2023-05-27 DIAGNOSIS — I130 Hypertensive heart and chronic kidney disease with heart failure and stage 1 through stage 4 chronic kidney disease, or unspecified chronic kidney disease: Secondary | ICD-10-CM | POA: Diagnosis not present

## 2023-05-28 DIAGNOSIS — H524 Presbyopia: Secondary | ICD-10-CM | POA: Diagnosis not present

## 2023-05-28 DIAGNOSIS — H53143 Visual discomfort, bilateral: Secondary | ICD-10-CM | POA: Diagnosis not present

## 2023-05-28 DIAGNOSIS — H353131 Nonexudative age-related macular degeneration, bilateral, early dry stage: Secondary | ICD-10-CM | POA: Diagnosis not present

## 2023-05-28 DIAGNOSIS — H33101 Unspecified retinoschisis, right eye: Secondary | ICD-10-CM | POA: Diagnosis not present

## 2023-05-29 DIAGNOSIS — E039 Hypothyroidism, unspecified: Secondary | ICD-10-CM | POA: Diagnosis not present

## 2023-05-29 DIAGNOSIS — I509 Heart failure, unspecified: Secondary | ICD-10-CM | POA: Diagnosis not present

## 2023-05-29 DIAGNOSIS — I130 Hypertensive heart and chronic kidney disease with heart failure and stage 1 through stage 4 chronic kidney disease, or unspecified chronic kidney disease: Secondary | ICD-10-CM | POA: Diagnosis not present

## 2023-05-29 DIAGNOSIS — F02A4 Dementia in other diseases classified elsewhere, mild, with anxiety: Secondary | ICD-10-CM | POA: Diagnosis not present

## 2023-05-29 DIAGNOSIS — F325 Major depressive disorder, single episode, in full remission: Secondary | ICD-10-CM | POA: Diagnosis not present

## 2023-05-29 DIAGNOSIS — F02A3 Dementia in other diseases classified elsewhere, mild, with mood disturbance: Secondary | ICD-10-CM | POA: Diagnosis not present

## 2023-06-04 DIAGNOSIS — F325 Major depressive disorder, single episode, in full remission: Secondary | ICD-10-CM | POA: Diagnosis not present

## 2023-06-04 DIAGNOSIS — E039 Hypothyroidism, unspecified: Secondary | ICD-10-CM | POA: Diagnosis not present

## 2023-06-04 DIAGNOSIS — I130 Hypertensive heart and chronic kidney disease with heart failure and stage 1 through stage 4 chronic kidney disease, or unspecified chronic kidney disease: Secondary | ICD-10-CM | POA: Diagnosis not present

## 2023-06-04 DIAGNOSIS — I509 Heart failure, unspecified: Secondary | ICD-10-CM | POA: Diagnosis not present

## 2023-06-04 DIAGNOSIS — F02A3 Dementia in other diseases classified elsewhere, mild, with mood disturbance: Secondary | ICD-10-CM | POA: Diagnosis not present

## 2023-06-04 DIAGNOSIS — F02A4 Dementia in other diseases classified elsewhere, mild, with anxiety: Secondary | ICD-10-CM | POA: Diagnosis not present

## 2023-06-05 DIAGNOSIS — N390 Urinary tract infection, site not specified: Secondary | ICD-10-CM | POA: Diagnosis not present

## 2023-06-05 DIAGNOSIS — R410 Disorientation, unspecified: Secondary | ICD-10-CM | POA: Diagnosis not present

## 2023-06-05 DIAGNOSIS — R350 Frequency of micturition: Secondary | ICD-10-CM | POA: Diagnosis not present

## 2023-06-08 DIAGNOSIS — F02B0 Dementia in other diseases classified elsewhere, moderate, without behavioral disturbance, psychotic disturbance, mood disturbance, and anxiety: Secondary | ICD-10-CM | POA: Diagnosis not present

## 2023-06-09 ENCOUNTER — Emergency Department (HOSPITAL_BASED_OUTPATIENT_CLINIC_OR_DEPARTMENT_OTHER)

## 2023-06-09 ENCOUNTER — Emergency Department (HOSPITAL_BASED_OUTPATIENT_CLINIC_OR_DEPARTMENT_OTHER)
Admission: EM | Admit: 2023-06-09 | Discharge: 2023-06-10 | Disposition: A | Discharge status: Home / Self Care | Attending: Emergency Medicine | Admitting: Emergency Medicine

## 2023-06-09 ENCOUNTER — Encounter (HOSPITAL_BASED_OUTPATIENT_CLINIC_OR_DEPARTMENT_OTHER): Payer: Self-pay

## 2023-06-09 ENCOUNTER — Other Ambulatory Visit: Payer: Self-pay

## 2023-06-09 DIAGNOSIS — R0989 Other specified symptoms and signs involving the circulatory and respiratory systems: Secondary | ICD-10-CM | POA: Diagnosis not present

## 2023-06-09 DIAGNOSIS — I10 Essential (primary) hypertension: Secondary | ICD-10-CM | POA: Diagnosis not present

## 2023-06-09 DIAGNOSIS — Z79899 Other long term (current) drug therapy: Secondary | ICD-10-CM | POA: Insufficient documentation

## 2023-06-09 DIAGNOSIS — R630 Anorexia: Secondary | ICD-10-CM | POA: Insufficient documentation

## 2023-06-09 DIAGNOSIS — R531 Weakness: Secondary | ICD-10-CM | POA: Diagnosis not present

## 2023-06-09 DIAGNOSIS — Z8542 Personal history of malignant neoplasm of other parts of uterus: Secondary | ICD-10-CM | POA: Diagnosis not present

## 2023-06-09 DIAGNOSIS — E039 Hypothyroidism, unspecified: Secondary | ICD-10-CM | POA: Insufficient documentation

## 2023-06-09 DIAGNOSIS — E119 Type 2 diabetes mellitus without complications: Secondary | ICD-10-CM | POA: Diagnosis not present

## 2023-06-09 DIAGNOSIS — M25511 Pain in right shoulder: Secondary | ICD-10-CM | POA: Diagnosis not present

## 2023-06-09 DIAGNOSIS — R109 Unspecified abdominal pain: Secondary | ICD-10-CM | POA: Diagnosis not present

## 2023-06-09 DIAGNOSIS — R935 Abnormal findings on diagnostic imaging of other abdominal regions, including retroperitoneum: Secondary | ICD-10-CM | POA: Diagnosis not present

## 2023-06-09 DIAGNOSIS — J9811 Atelectasis: Secondary | ICD-10-CM | POA: Diagnosis not present

## 2023-06-09 DIAGNOSIS — K8020 Calculus of gallbladder without cholecystitis without obstruction: Secondary | ICD-10-CM | POA: Diagnosis not present

## 2023-06-09 DIAGNOSIS — N281 Cyst of kidney, acquired: Secondary | ICD-10-CM | POA: Diagnosis not present

## 2023-06-09 DIAGNOSIS — J90 Pleural effusion, not elsewhere classified: Secondary | ICD-10-CM | POA: Diagnosis not present

## 2023-06-09 DIAGNOSIS — M19011 Primary osteoarthritis, right shoulder: Secondary | ICD-10-CM | POA: Diagnosis not present

## 2023-06-09 LAB — CBC WITH DIFFERENTIAL/PLATELET
Abs Immature Granulocytes: 0.1 10*3/uL — ABNORMAL HIGH (ref 0.00–0.07)
Basophils Absolute: 0 10*3/uL (ref 0.0–0.1)
Basophils Relative: 0 %
Eosinophils Absolute: 0 10*3/uL (ref 0.0–0.5)
Eosinophils Relative: 0 %
HCT: 36.9 % (ref 36.0–46.0)
Hemoglobin: 12 g/dL (ref 12.0–15.0)
Immature Granulocytes: 1 %
Lymphocytes Relative: 25 %
Lymphs Abs: 4.4 10*3/uL — ABNORMAL HIGH (ref 0.7–4.0)
MCH: 29.6 pg (ref 26.0–34.0)
MCHC: 32.5 g/dL (ref 30.0–36.0)
MCV: 91.1 fL (ref 80.0–100.0)
Monocytes Absolute: 2.3 10*3/uL — ABNORMAL HIGH (ref 0.1–1.0)
Monocytes Relative: 13 %
Neutro Abs: 11 10*3/uL — ABNORMAL HIGH (ref 1.7–7.7)
Neutrophils Relative %: 61 %
Platelets: 344 10*3/uL (ref 150–400)
RBC: 4.05 MIL/uL (ref 3.87–5.11)
RDW: 14.9 % (ref 11.5–15.5)
WBC: 17.8 10*3/uL — ABNORMAL HIGH (ref 4.0–10.5)
nRBC: 0 % (ref 0.0–0.2)

## 2023-06-09 LAB — COMPREHENSIVE METABOLIC PANEL WITH GFR
ALT: 22 U/L (ref 0–44)
AST: 58 U/L — ABNORMAL HIGH (ref 15–41)
Albumin: 3.8 g/dL (ref 3.5–5.0)
Alkaline Phosphatase: 63 U/L (ref 38–126)
Anion gap: 13 (ref 5–15)
BUN: 29 mg/dL — ABNORMAL HIGH (ref 8–23)
CO2: 26 mmol/L (ref 22–32)
Calcium: 10.1 mg/dL (ref 8.9–10.3)
Chloride: 100 mmol/L (ref 98–111)
Creatinine, Ser: 1.12 mg/dL — ABNORMAL HIGH (ref 0.44–1.00)
GFR, Estimated: 47 mL/min — ABNORMAL LOW (ref >60)
Glucose, Bld: 118 mg/dL — ABNORMAL HIGH (ref 70–99)
Potassium: 4.1 mmol/L (ref 3.5–5.1)
Sodium: 138 mmol/L (ref 135–145)
Total Bilirubin: 0.8 mg/dL (ref 0.0–1.2)
Total Protein: 7.5 g/dL (ref 6.5–8.1)

## 2023-06-09 LAB — URINALYSIS, ROUTINE W REFLEX MICROSCOPIC
Bacteria, UA: NONE SEEN
Bilirubin Urine: NEGATIVE
Glucose, UA: NEGATIVE mg/dL
Hgb urine dipstick: NEGATIVE
Ketones, ur: NEGATIVE mg/dL
Nitrite: NEGATIVE
Protein, ur: NEGATIVE mg/dL
Specific Gravity, Urine: 1.018 (ref 1.005–1.030)
pH: 6.5 (ref 5.0–8.0)

## 2023-06-09 LAB — T4, FREE: Free T4: 1.17 ng/dL — ABNORMAL HIGH (ref 0.61–1.12)

## 2023-06-09 LAB — TROPONIN T, HIGH SENSITIVITY
Troponin T High Sensitivity: 24 ng/L — ABNORMAL HIGH (ref <19)
Troponin T High Sensitivity: 24 ng/L — ABNORMAL HIGH (ref <19)

## 2023-06-09 LAB — TSH: TSH: 9.43 u[IU]/mL — ABNORMAL HIGH (ref 0.350–4.500)

## 2023-06-09 MED ORDER — IOHEXOL 300 MG/ML  SOLN
100.0000 mL | Freq: Once | INTRAMUSCULAR | Status: AC | PRN
  Administered 2023-06-09: 85 mL via INTRAVENOUS

## 2023-06-09 MED ORDER — ONDANSETRON 4 MG PO TBDP: 4.0000 mg | ORAL_TABLET | Freq: Three times a day (TID) | ORAL | 0 refills | Status: DC | PRN

## 2023-06-09 NOTE — ED Notes (Signed)
 Pt went to the BR upon arrival to the ED. Unable to provide urine sample at this time.

## 2023-06-09 NOTE — Discharge Instructions (Signed)
 You were seen in the ER today for concerns of weakness and right sided flank pain. Your labs and imaging were largely reassuring. Your CT scan did show some concerns for possible enteritis which would manifest as diarrhea or vomiting. If these symptoms develop, ensure that you are eating and drinking as best as possible. I have sent Zofran  for nausea control if needed. If diarrhea develops, Imodium may be used as long as there is no blood present in stool. For any concerns of new or worsening symptoms, return to the ER immediately for evaluation. Follow up with your primary care provider for further assessment.

## 2023-06-09 NOTE — ED Triage Notes (Signed)
 Per pt family, pt having increased bilateral leg weakness since this AM. Pt family reports R shoulder and R flank pain since this afternoon. Pt does have hx of frequent UTI's. Recently tested for UTI x1 week ago and negative result.

## 2023-06-09 NOTE — ED Provider Notes (Signed)
 [place] EMERGENCY DEPARTMENT AT [facility] Provider Note   CSN: [id number] Arrival date & time: 06/09/23  1615     History Chief Complaint  Patient presents with   Weakness    Diana Wood is a 89 y.o. female.  Patient past history significant for type 2 diabetes, hypothyroidism, hyperlipidemia, obesity, hypertension, GERD, hiatal hernia, uterine cancer presents to the emergency department today with concerns of generalized weakness.  Patient's family bedside reports that she has had increasing bilateral leg weakness starting yesterday.  She is currently endorsing pain in the right shoulder as well as the right flank since this afternoon.  Family is concerned for possible UTI given her history of frequent UTIs.  Had urine assessed by primary care provider about 1 week ago which was negative for any signs of infection.  No reported fever, chills, or bodyaches.  No obvious nausea, vomiting or diarrhea either.   Weakness      Home Medications Prior to Admission medications   Medication Sig Start Date End Date Taking? Authorizing Provider  ondansetron  (ZOFRAN -ODT) 4 MG disintegrating tablet Take 1 tablet (4 mg total) by mouth every 8 (eight) hours as needed for nausea or vomiting. 06/09/23  Yes Aryka Coonradt A, PA-C  Accu-Chek Softclix Lancets lancets 1 each by Other route as needed. 03/27/19   [provider]  amLODipine  (NORVASC ) 10 MG tablet Take 10 mg by mouth daily.    [provider]  Cholecalciferol  (VITAMIN D ) 50 MCG (2000 UT) CAPS Take 2,000 Units by mouth daily.    [provider]  fenofibrate  (TRICOR ) 145 MG tablet Take 145 mg by mouth daily after breakfast.    [provider]  ferrous sulfate  325 (65 FE) MG tablet Take 325 mg by mouth daily. 03/26/18   [provider]  hydrALAZINE  (APRESOLINE ) 25 MG tablet Take 50 mg by mouth in the morning and at bedtime.    [provider]  hydrochlorothiazide  (HYDRODIURIL ) 12.5  MG tablet Take 12.5 mg by mouth daily after breakfast.     [provider]  levothyroxine  (SYNTHROID ) 125 MCG tablet Take 125 mcg by mouth daily before breakfast. 04/07/20   [provider]  memantine  (NAMENDA ) 5 MG tablet Take 5 mg by mouth 2 (two) times daily. 12/15/22   [provider]  nitroGLYCERIN  (NITROSTAT ) 0.4 MG SL tablet Place 1 tablet (0.4 mg total) under the tongue every 5 (five) minutes as needed for chest pain. 02/03/23   Nishan, Peter C, MD  Omega-3 Fatty Acids (FISH OIL PO) Take 1 tablet by mouth daily.    [provider]  omeprazole  (PRILOSEC) 20 MG capsule Take 20 mg by mouth daily after breakfast.    [provider]  prednisoLONE  acetate (PRED FORTE ) 1 % ophthalmic suspension Place 1 drop into both eyes every morning.    [provider]  REXULTI 0.5 MG TABS 0.5 mg once. Patient takes in the Morning 12/03/22   [provider]  sertraline  (ZOLOFT ) 100 MG tablet Take 100 mg by mouth daily. 06/09/20   [provider]  sertraline  (ZOLOFT ) 25 MG tablet Take 150 mg by mouth daily.    [provider]  TRUE METRIX BLOOD GLUCOSE TEST test strip 1 each by Other route daily. 03/27/19   [provider]      Allergies    Ace inhibitors, Statins, Tramadol  hcl, Losartan , and Tape    Review of Systems   Review of Systems  Neurological:  Positive for weakness.  All other systems reviewed and are negative.   Physical Exam Updated Vital Signs BP 136/79 (BP Location: Right Arm)   Pulse 79   Temp 98.5 F (36.9 C) (Oral)   Resp 18   Ht 4\' 8"  (1.422 m)   Wt 59.4 kg   SpO2 93%   BMI 29.37 kg/m  Physical Exam Vitals and nursing note reviewed.  Constitutional:      General: She is not in acute distress.    Appearance: Normal appearance. She is well-developed. She is not ill-appearing.  HENT:     Head: Normocephalic and atraumatic.  Eyes:     Conjunctiva/sclera: Conjunctivae normal.  Cardiovascular:      Rate and Rhythm: Normal rate and regular rhythm.     Heart sounds: No murmur heard. Pulmonary:     Effort: Pulmonary effort is normal. No respiratory distress.     Breath sounds: Normal breath sounds.  Abdominal:     Palpations: Abdomen is soft.     Tenderness: There is abdominal tenderness. There is no guarding.       Comments: Right sided mid-abdominal pain to palpation.  Musculoskeletal:        General: No swelling.     Cervical back: Neck supple.  Skin:    General: Skin is warm and dry.     Capillary Refill: Capillary refill takes less than 2 seconds.     Comments: Some bruising to anterior shins on bilateral lower legs but no obvious skin breakdown, ulceration, or cellulitis present on body.  Neurological:     Mental Status: She is alert.  Psychiatric:        Mood and Affect: Mood normal.     ED Results / Procedures / Treatments   Labs (all labs ordered are listed, but only abnormal results are displayed) Labs Reviewed  CBC WITH DIFFERENTIAL/PLATELET - Abnormal; Notable for the following components:      Result Value   WBC 17.8 (*)    Neutro Abs 11.0 (*)    Lymphs Abs 4.4 (*)    Monocytes Absolute 2.3 (*)    Abs Immature Granulocytes 0.10 (*)    All other components within normal limits  COMPREHENSIVE METABOLIC PANEL WITH GFR - Abnormal; Notable for the following components:   Glucose, Bld 118 (*)    BUN 29 (*)    Creatinine, Ser 1.12 (*)    AST 58 (*)    GFR, Estimated 47 (*)    All other components within normal limits  URINALYSIS, ROUTINE W REFLEX MICROSCOPIC - Abnormal; Notable for the following components:   Leukocytes,Ua SMALL (*)    All other components within normal limits  TSH - Abnormal; Notable for the following components:   TSH 9.430 (*)    All other components within normal limits  T4, FREE - Abnormal; Notable for the following components:   Free T4 1.17 (*)    All other components within normal limits  TROPONIN T, HIGH SENSITIVITY -  Abnormal; Notable for the following components:   Troponin T High Sensitivity 24 (*)    All other components within normal limits  TROPONIN T, HIGH SENSITIVITY - Abnormal; Notable for the following components:   Troponin T High Sensitivity 24 (*)    All other components within normal limits    EKG EKG Interpretation Date/Time:  Monday June 09 2023 17:08:17 EDT Ventricular Rate:  75 PR Interval:  48 QRS Duration:  144 QT Interval:  439 QTC Calculation: 494 R Axis:   -68  Text Interpretation: A-V dual-paced complexes w/ some inhibition No further analysis attempted due to paced rhythm No significant change since last tracing Confirmed by Rosealee Concha ([id number]) on 06/09/2023 9:00:50 PM  Radiology CT ABDOMEN PELVIS W CONTRAST Result Date: 06/09/2023 CLINICAL DATA:  Acute abdominal flank pain and flank pain. EXAM: CT ABDOMEN AND PELVIS WITH CONTRAST TECHNIQUE: Multidetector CT imaging of the abdomen and pelvis was performed using the standard protocol following bolus administration of intravenous contrast. RADIATION DOSE REDUCTION: This exam was performed according to the departmental dose-optimization program which includes automated exposure control, adjustment of the mA and/or kV according to patient size and/or use of iterative reconstruction technique. CONTRAST:  85mL OMNIPAQUE  IOHEXOL  300 MG/ML  SOLN COMPARISON:  CT chest abdomen and pelvis 09/21/2020. FINDINGS: Lower chest: There is a small right pleural effusion and atelectasis in the right lung base. Heart is enlarged. Hepatobiliary: Gallstones are present. There is no biliary ductal dilatation. Liver is within normal limits. Pancreas: There is a rounded hypodensity in the neck of the pancreas measuring 7 mm. Otherwise, the pancreas appears within normal limits. Spleen: Normal in size without focal abnormality. Adrenals/Urinary Tract: Small bilateral peripelvic cysts are present. Otherwise, the kidneys, adrenal glands and bladder are within  normal limits. Stomach/Bowel: Partial right colectomy changes are present. There is no evidence for bowel obstruction, pneumatosis or free air stomach is decompressed. No focal wall thickening or inflammation. There is scattered air-fluid levels throughout small bowel loops. Vascular/Lymphatic: Aortic atherosclerosis. No enlarged abdominal or pelvic lymph nodes. There are surgical clips in the retroperitoneum, unchanged Reproductive: Status post hysterectomy. No adnexal masses. Other: No abdominal wall hernia or abnormality. No abdominopelvic ascites. Musculoskeletal: Degenerative changes affect the spine. IMPRESSION: 1. Scattered air-fluid levels throughout small bowel loops worrisome for enteritis. No bowel obstruction. 2. Small right pleural effusion and atelectasis in the right lung base. 3. Cholelithiasis. 4. 7 mm hypodensity in the neck of the pancreas. Recommend follow-up MRI in 1 year. 5. Aortic atherosclerosis. Aortic Atherosclerosis (ICD10-I70.0). Electronically Signed   By: Tyron Gallon M.D.   On: 06/09/2023 22:47   DG Shoulder Right Result Date: 06/09/2023 CLINICAL DATA:  Right shoulder pain since this afternoon. EXAM: RIGHT SHOULDER - 2+ VIEW COMPARISON:  None Available. FINDINGS: Mild degenerative changes in the right shoulder. Acromioclavicular and coracoclavicular spaces are normal. No evidence of acute fracture or dislocation. No focal bone lesion or bone destruction. Soft tissues are unremarkable. IMPRESSION: Degenerative changes in the right shoulder. No acute displaced fractures identified. Electronically Signed   By: Boyce Byes M.D.   On: 06/09/2023 18:20   DG Chest Portable 1 View Result Date: 06/09/2023 CLINICAL DATA:  Weakness. Increased bilateral leg weakness since this morning. Right shoulder and right flank pain. EXAM: PORTABLE CHEST 1 VIEW COMPARISON:  08/23/2015 FINDINGS: Postoperative changes in the mediastinum. Cardiac pacemaker. Shallow inspiration. Mild cardiac enlargement  with mild pulmonary vascular congestion. Suggestion of small right pleural effusion with infiltration or atelectasis in the right base. No pneumothorax. Mediastinal contours appear intact. Calcified and tortuous aorta. Degenerative changes in the spine and shoulders. IMPRESSION: Cardiac enlargement with mild vascular congestion. Small right pleural effusion with basilar atelectasis. Electronically Signed   By: Boyce Byes M.D.   On: 06/09/2023 18:18    Procedures Procedures    Medications Ordered in ED Medications  iohexol  (OMNIPAQUE ) 300 MG/ML solution 100 mL (85 mLs Intravenous Contrast Given 06/09/23 2202)    ED Course/ Medical Decision Making/ A&P  Medical Decision Making Amount and/or Complexity of Data Reviewed Labs: ordered. Radiology: ordered.  Risk Prescription drug management.   This patient presents to the ED for concern of weakness.  Differential diagnosis includes sepsis, UTI, urolithiasis, bowel obstruction, viral URI, pneumonia, sepsis   Lab Tests:  I Ordered, and personally interpreted labs.  The pertinent results include: CBC with leukocytosis at 17.8, CMP with baseline renal function with no other significant changes, urinalysis not consistent with infection with only leukocytes seen, troponin initially at 24 with repeat troponin at 24.  TSH elevated at 9.43 and free T4 elevated at 1.17.   Imaging Studies ordered:  I ordered imaging studies including x-ray of the chest, right shoulder x-ray, CT abdomen pelvis I independently visualized and interpreted imaging which showed Cardiac enlargement with mild vascular congestion. Small right pleural effusion with basilar atelectasis.Degenerative changes in the right shoulder. No acute displaced fractures identified.1. Scattered air-fluid levels throughout small bowel loops worrisome for enteritis. No bowel obstruction. 2. Small right pleural effusion and atelectasis in the right lung  base. 3. Cholelithiasis. 4. 7 mm hypodensity in the neck of the pancreas. Recommend follow-up MRI in 1 year. 5. Aortic atherosclerosis. I agree with the radiologist interpretation   Problem List / ED Course:  Patient with past history significant for type 2 diabetes, hypothyroidism, hyperlipidemia, obesity, hypertension, GERD, able hernia, uterine cancer here today with concerns of generalized weakness.  Family both report that the symptoms have been ongoing for the last day or 2 with weakness worsening over that time as well.  They report that she is under the care of caretakers at home and she has 1 caretaker in particular that ends up having her in a wheelchair more often which reduces her mobility. On exam, patient is otherwise normal-appearing with no obvious signs of sepsis or infection.  There is no pressure ulcers or wounds, some bruising seen to the lower extremities but no significant skin tears or lesions.  Abdomen is tender to the right side but no right lower quadrant or right upper quadrant tenderness.  Will proceed with lab workup for possible source of generalized weakness symptoms including infectious etiology such as UTI versus other cause.  Cardiac evaluation also added on for concerns of generalized weakness. Workup is concerning with notable leukocytosis at 17.8.  Suspect likely infectious etiology and will wait for further testing. CMP with evident renal dysfunction with creatinine 1.12 and GFR 47.  UA not consistent with infection.  Troponin slightly elevated 24 with delta of staying consistent of 24.  TSH and free T4 elevated. CT imaging of the abdomen with some concerns for possible enteritis in the small bowel as there are air-fluid levels but no evidence of obstruction.  Patient not currently having any vomiting or diarrhea as far as family is aware.  Unclear patient may be seen to develop enteritis type symptoms.  This patient is not septic appearing, will hold off on starting  antibiotic therapy.  Difficult to determine if patient is more altered or confused as she does have dementia at baseline.  Advise close follow-up with primary care provider for further assessment.  Encouraged return precautions such as any concerns for worsening symptoms such as fever, vomiting, diarrhea, or worsening weakness.  Family at bedside are agreeable with current treatment plan and verbalized understanding return precautions.  Patient discharged home in stable condition with family at bedside.  Final Clinical Impression(s) / ED Diagnoses Final diagnoses:  Generalized weakness  Poor appetite    Rx /  DC Orders ED Discharge Orders          Ordered    ondansetron  (ZOFRAN -ODT) 4 MG disintegrating tablet  Every 8 hours PRN        06/09/23 2327              Soraya Paquette A, PA-C 06/10/23 0001    Rosealee Concha, MD 06/10/23 0157

## 2023-06-12 DIAGNOSIS — F02A4 Dementia in other diseases classified elsewhere, mild, with anxiety: Secondary | ICD-10-CM | POA: Diagnosis not present

## 2023-06-12 DIAGNOSIS — I130 Hypertensive heart and chronic kidney disease with heart failure and stage 1 through stage 4 chronic kidney disease, or unspecified chronic kidney disease: Secondary | ICD-10-CM | POA: Diagnosis not present

## 2023-06-12 DIAGNOSIS — F325 Major depressive disorder, single episode, in full remission: Secondary | ICD-10-CM | POA: Diagnosis not present

## 2023-06-12 DIAGNOSIS — I509 Heart failure, unspecified: Secondary | ICD-10-CM | POA: Diagnosis not present

## 2023-06-12 DIAGNOSIS — F02A3 Dementia in other diseases classified elsewhere, mild, with mood disturbance: Secondary | ICD-10-CM | POA: Diagnosis not present

## 2023-06-12 DIAGNOSIS — E039 Hypothyroidism, unspecified: Secondary | ICD-10-CM | POA: Diagnosis not present

## 2023-06-13 DIAGNOSIS — I2510 Atherosclerotic heart disease of native coronary artery without angina pectoris: Secondary | ICD-10-CM | POA: Diagnosis not present

## 2023-06-13 DIAGNOSIS — R2689 Other abnormalities of gait and mobility: Secondary | ICD-10-CM | POA: Diagnosis not present

## 2023-06-13 DIAGNOSIS — R634 Abnormal weight loss: Secondary | ICD-10-CM | POA: Diagnosis not present

## 2023-06-13 DIAGNOSIS — C186 Malignant neoplasm of descending colon: Secondary | ICD-10-CM | POA: Diagnosis not present

## 2023-06-13 DIAGNOSIS — E039 Hypothyroidism, unspecified: Secondary | ICD-10-CM | POA: Diagnosis not present

## 2023-06-13 DIAGNOSIS — M25519 Pain in unspecified shoulder: Secondary | ICD-10-CM | POA: Diagnosis not present

## 2023-06-13 DIAGNOSIS — F03A0 Unspecified dementia, mild, without behavioral disturbance, psychotic disturbance, mood disturbance, and anxiety: Secondary | ICD-10-CM | POA: Diagnosis not present

## 2023-06-13 DIAGNOSIS — K8689 Other specified diseases of pancreas: Secondary | ICD-10-CM | POA: Diagnosis not present

## 2023-06-13 DIAGNOSIS — D72829 Elevated white blood cell count, unspecified: Secondary | ICD-10-CM | POA: Diagnosis not present

## 2023-06-13 DIAGNOSIS — N1831 Chronic kidney disease, stage 3a: Secondary | ICD-10-CM | POA: Diagnosis not present

## 2023-06-13 DIAGNOSIS — E119 Type 2 diabetes mellitus without complications: Secondary | ICD-10-CM | POA: Diagnosis not present

## 2023-06-13 DIAGNOSIS — I119 Hypertensive heart disease without heart failure: Secondary | ICD-10-CM | POA: Diagnosis not present

## 2023-06-15 DIAGNOSIS — I700 Atherosclerosis of aorta: Secondary | ICD-10-CM | POA: Diagnosis not present

## 2023-06-15 DIAGNOSIS — I2510 Atherosclerotic heart disease of native coronary artery without angina pectoris: Secondary | ICD-10-CM | POA: Diagnosis not present

## 2023-06-15 DIAGNOSIS — F03A3 Unspecified dementia, mild, with mood disturbance: Secondary | ICD-10-CM | POA: Diagnosis not present

## 2023-06-15 DIAGNOSIS — F325 Major depressive disorder, single episode, in full remission: Secondary | ICD-10-CM | POA: Diagnosis not present

## 2023-06-15 DIAGNOSIS — F02A4 Dementia in other diseases classified elsewhere, mild, with anxiety: Secondary | ICD-10-CM | POA: Diagnosis not present

## 2023-06-15 DIAGNOSIS — E039 Hypothyroidism, unspecified: Secondary | ICD-10-CM | POA: Diagnosis not present

## 2023-06-15 DIAGNOSIS — I8390 Asymptomatic varicose veins of unspecified lower extremity: Secondary | ICD-10-CM | POA: Diagnosis not present

## 2023-06-15 DIAGNOSIS — N1831 Chronic kidney disease, stage 3a: Secondary | ICD-10-CM | POA: Diagnosis not present

## 2023-06-15 DIAGNOSIS — E1122 Type 2 diabetes mellitus with diabetic chronic kidney disease: Secondary | ICD-10-CM | POA: Diagnosis not present

## 2023-06-15 DIAGNOSIS — I130 Hypertensive heart and chronic kidney disease with heart failure and stage 1 through stage 4 chronic kidney disease, or unspecified chronic kidney disease: Secondary | ICD-10-CM | POA: Diagnosis not present

## 2023-06-15 DIAGNOSIS — N390 Urinary tract infection, site not specified: Secondary | ICD-10-CM | POA: Diagnosis not present

## 2023-06-15 DIAGNOSIS — I509 Heart failure, unspecified: Secondary | ICD-10-CM | POA: Diagnosis not present

## 2023-06-16 DIAGNOSIS — F02A3 Dementia in other diseases classified elsewhere, mild, with mood disturbance: Secondary | ICD-10-CM | POA: Diagnosis not present

## 2023-06-16 DIAGNOSIS — F325 Major depressive disorder, single episode, in full remission: Secondary | ICD-10-CM | POA: Diagnosis not present

## 2023-06-16 DIAGNOSIS — I130 Hypertensive heart and chronic kidney disease with heart failure and stage 1 through stage 4 chronic kidney disease, or unspecified chronic kidney disease: Secondary | ICD-10-CM | POA: Diagnosis not present

## 2023-06-16 DIAGNOSIS — F02A4 Dementia in other diseases classified elsewhere, mild, with anxiety: Secondary | ICD-10-CM | POA: Diagnosis not present

## 2023-06-16 DIAGNOSIS — I509 Heart failure, unspecified: Secondary | ICD-10-CM | POA: Diagnosis not present

## 2023-06-16 DIAGNOSIS — E039 Hypothyroidism, unspecified: Secondary | ICD-10-CM | POA: Diagnosis not present

## 2023-06-21 DIAGNOSIS — E039 Hypothyroidism, unspecified: Secondary | ICD-10-CM | POA: Diagnosis not present

## 2023-06-21 DIAGNOSIS — E559 Vitamin D deficiency, unspecified: Secondary | ICD-10-CM | POA: Diagnosis not present

## 2023-06-21 DIAGNOSIS — F325 Major depressive disorder, single episode, in full remission: Secondary | ICD-10-CM | POA: Diagnosis not present

## 2023-06-21 DIAGNOSIS — Z951 Presence of aortocoronary bypass graft: Secondary | ICD-10-CM | POA: Diagnosis not present

## 2023-06-21 DIAGNOSIS — D500 Iron deficiency anemia secondary to blood loss (chronic): Secondary | ICD-10-CM | POA: Diagnosis not present

## 2023-06-21 DIAGNOSIS — N390 Urinary tract infection, site not specified: Secondary | ICD-10-CM | POA: Diagnosis not present

## 2023-06-21 DIAGNOSIS — H9190 Unspecified hearing loss, unspecified ear: Secondary | ICD-10-CM | POA: Diagnosis not present

## 2023-06-21 DIAGNOSIS — E785 Hyperlipidemia, unspecified: Secondary | ICD-10-CM | POA: Diagnosis not present

## 2023-06-21 DIAGNOSIS — K219 Gastro-esophageal reflux disease without esophagitis: Secondary | ICD-10-CM | POA: Diagnosis not present

## 2023-06-21 DIAGNOSIS — I2510 Atherosclerotic heart disease of native coronary artery without angina pectoris: Secondary | ICD-10-CM | POA: Diagnosis not present

## 2023-06-21 DIAGNOSIS — F02A3 Dementia in other diseases classified elsewhere, mild, with mood disturbance: Secondary | ICD-10-CM | POA: Diagnosis not present

## 2023-06-21 DIAGNOSIS — F02A4 Dementia in other diseases classified elsewhere, mild, with anxiety: Secondary | ICD-10-CM | POA: Diagnosis not present

## 2023-06-21 DIAGNOSIS — I130 Hypertensive heart and chronic kidney disease with heart failure and stage 1 through stage 4 chronic kidney disease, or unspecified chronic kidney disease: Secondary | ICD-10-CM | POA: Diagnosis not present

## 2023-06-21 DIAGNOSIS — Z792 Long term (current) use of antibiotics: Secondary | ICD-10-CM | POA: Diagnosis not present

## 2023-06-21 DIAGNOSIS — I509 Heart failure, unspecified: Secondary | ICD-10-CM | POA: Diagnosis not present

## 2023-06-21 DIAGNOSIS — I700 Atherosclerosis of aorta: Secondary | ICD-10-CM | POA: Diagnosis not present

## 2023-06-21 DIAGNOSIS — M1990 Unspecified osteoarthritis, unspecified site: Secondary | ICD-10-CM | POA: Diagnosis not present

## 2023-06-21 DIAGNOSIS — K760 Fatty (change of) liver, not elsewhere classified: Secondary | ICD-10-CM | POA: Diagnosis not present

## 2023-06-21 DIAGNOSIS — E1122 Type 2 diabetes mellitus with diabetic chronic kidney disease: Secondary | ICD-10-CM | POA: Diagnosis not present

## 2023-06-21 DIAGNOSIS — I8390 Asymptomatic varicose veins of unspecified lower extremity: Secondary | ICD-10-CM | POA: Diagnosis not present

## 2023-06-21 DIAGNOSIS — D692 Other nonthrombocytopenic purpura: Secondary | ICD-10-CM | POA: Diagnosis not present

## 2023-06-21 DIAGNOSIS — Z96653 Presence of artificial knee joint, bilateral: Secondary | ICD-10-CM | POA: Diagnosis not present

## 2023-06-21 DIAGNOSIS — E538 Deficiency of other specified B group vitamins: Secondary | ICD-10-CM | POA: Diagnosis not present

## 2023-06-21 DIAGNOSIS — N1831 Chronic kidney disease, stage 3a: Secondary | ICD-10-CM | POA: Diagnosis not present

## 2023-06-23 DIAGNOSIS — F02A3 Dementia in other diseases classified elsewhere, mild, with mood disturbance: Secondary | ICD-10-CM | POA: Diagnosis not present

## 2023-06-23 DIAGNOSIS — E039 Hypothyroidism, unspecified: Secondary | ICD-10-CM | POA: Diagnosis not present

## 2023-06-23 DIAGNOSIS — F325 Major depressive disorder, single episode, in full remission: Secondary | ICD-10-CM | POA: Diagnosis not present

## 2023-06-23 DIAGNOSIS — I509 Heart failure, unspecified: Secondary | ICD-10-CM | POA: Diagnosis not present

## 2023-06-23 DIAGNOSIS — I130 Hypertensive heart and chronic kidney disease with heart failure and stage 1 through stage 4 chronic kidney disease, or unspecified chronic kidney disease: Secondary | ICD-10-CM | POA: Diagnosis not present

## 2023-06-23 DIAGNOSIS — F02A4 Dementia in other diseases classified elsewhere, mild, with anxiety: Secondary | ICD-10-CM | POA: Diagnosis not present

## 2023-07-02 DIAGNOSIS — I509 Heart failure, unspecified: Secondary | ICD-10-CM | POA: Diagnosis not present

## 2023-07-02 DIAGNOSIS — I130 Hypertensive heart and chronic kidney disease with heart failure and stage 1 through stage 4 chronic kidney disease, or unspecified chronic kidney disease: Secondary | ICD-10-CM | POA: Diagnosis not present

## 2023-07-02 DIAGNOSIS — E039 Hypothyroidism, unspecified: Secondary | ICD-10-CM | POA: Diagnosis not present

## 2023-07-02 DIAGNOSIS — F02A4 Dementia in other diseases classified elsewhere, mild, with anxiety: Secondary | ICD-10-CM | POA: Diagnosis not present

## 2023-07-02 DIAGNOSIS — F02A3 Dementia in other diseases classified elsewhere, mild, with mood disturbance: Secondary | ICD-10-CM | POA: Diagnosis not present

## 2023-07-02 DIAGNOSIS — F325 Major depressive disorder, single episode, in full remission: Secondary | ICD-10-CM | POA: Diagnosis not present

## 2023-07-07 NOTE — Progress Notes (Signed)
 Remote pacemaker transmission.

## 2023-07-07 NOTE — Addendum Note (Signed)
 Addended by: VICCI SELLER A on: 07/07/2023 11:28 AM   Modules accepted: Orders

## 2023-07-08 DIAGNOSIS — F02B0 Dementia in other diseases classified elsewhere, moderate, without behavioral disturbance, psychotic disturbance, mood disturbance, and anxiety: Secondary | ICD-10-CM | POA: Diagnosis not present

## 2023-07-09 DIAGNOSIS — F02A3 Dementia in other diseases classified elsewhere, mild, with mood disturbance: Secondary | ICD-10-CM | POA: Diagnosis not present

## 2023-07-09 DIAGNOSIS — I130 Hypertensive heart and chronic kidney disease with heart failure and stage 1 through stage 4 chronic kidney disease, or unspecified chronic kidney disease: Secondary | ICD-10-CM | POA: Diagnosis not present

## 2023-07-09 DIAGNOSIS — F02A4 Dementia in other diseases classified elsewhere, mild, with anxiety: Secondary | ICD-10-CM | POA: Diagnosis not present

## 2023-07-09 DIAGNOSIS — E039 Hypothyroidism, unspecified: Secondary | ICD-10-CM | POA: Diagnosis not present

## 2023-07-09 DIAGNOSIS — I509 Heart failure, unspecified: Secondary | ICD-10-CM | POA: Diagnosis not present

## 2023-07-09 DIAGNOSIS — F325 Major depressive disorder, single episode, in full remission: Secondary | ICD-10-CM | POA: Diagnosis not present

## 2023-07-16 DIAGNOSIS — F325 Major depressive disorder, single episode, in full remission: Secondary | ICD-10-CM | POA: Diagnosis not present

## 2023-07-16 DIAGNOSIS — F02A3 Dementia in other diseases classified elsewhere, mild, with mood disturbance: Secondary | ICD-10-CM | POA: Diagnosis not present

## 2023-07-16 DIAGNOSIS — E039 Hypothyroidism, unspecified: Secondary | ICD-10-CM | POA: Diagnosis not present

## 2023-07-16 DIAGNOSIS — I509 Heart failure, unspecified: Secondary | ICD-10-CM | POA: Diagnosis not present

## 2023-07-16 DIAGNOSIS — F02A4 Dementia in other diseases classified elsewhere, mild, with anxiety: Secondary | ICD-10-CM | POA: Diagnosis not present

## 2023-07-16 DIAGNOSIS — I130 Hypertensive heart and chronic kidney disease with heart failure and stage 1 through stage 4 chronic kidney disease, or unspecified chronic kidney disease: Secondary | ICD-10-CM | POA: Diagnosis not present

## 2023-07-21 DIAGNOSIS — Z951 Presence of aortocoronary bypass graft: Secondary | ICD-10-CM | POA: Diagnosis not present

## 2023-07-21 DIAGNOSIS — I700 Atherosclerosis of aorta: Secondary | ICD-10-CM | POA: Diagnosis not present

## 2023-07-21 DIAGNOSIS — E1122 Type 2 diabetes mellitus with diabetic chronic kidney disease: Secondary | ICD-10-CM | POA: Diagnosis not present

## 2023-07-21 DIAGNOSIS — I509 Heart failure, unspecified: Secondary | ICD-10-CM | POA: Diagnosis not present

## 2023-07-21 DIAGNOSIS — K219 Gastro-esophageal reflux disease without esophagitis: Secondary | ICD-10-CM | POA: Diagnosis not present

## 2023-07-21 DIAGNOSIS — H9190 Unspecified hearing loss, unspecified ear: Secondary | ICD-10-CM | POA: Diagnosis not present

## 2023-07-21 DIAGNOSIS — F02A3 Dementia in other diseases classified elsewhere, mild, with mood disturbance: Secondary | ICD-10-CM | POA: Diagnosis not present

## 2023-07-21 DIAGNOSIS — R8290 Unspecified abnormal findings in urine: Secondary | ICD-10-CM | POA: Diagnosis not present

## 2023-07-21 DIAGNOSIS — N390 Urinary tract infection, site not specified: Secondary | ICD-10-CM | POA: Diagnosis not present

## 2023-07-21 DIAGNOSIS — I130 Hypertensive heart and chronic kidney disease with heart failure and stage 1 through stage 4 chronic kidney disease, or unspecified chronic kidney disease: Secondary | ICD-10-CM | POA: Diagnosis not present

## 2023-07-21 DIAGNOSIS — D692 Other nonthrombocytopenic purpura: Secondary | ICD-10-CM | POA: Diagnosis not present

## 2023-07-21 DIAGNOSIS — M1990 Unspecified osteoarthritis, unspecified site: Secondary | ICD-10-CM | POA: Diagnosis not present

## 2023-07-21 DIAGNOSIS — E039 Hypothyroidism, unspecified: Secondary | ICD-10-CM | POA: Diagnosis not present

## 2023-07-21 DIAGNOSIS — F02A4 Dementia in other diseases classified elsewhere, mild, with anxiety: Secondary | ICD-10-CM | POA: Diagnosis not present

## 2023-07-21 DIAGNOSIS — E785 Hyperlipidemia, unspecified: Secondary | ICD-10-CM | POA: Diagnosis not present

## 2023-07-21 DIAGNOSIS — F325 Major depressive disorder, single episode, in full remission: Secondary | ICD-10-CM | POA: Diagnosis not present

## 2023-07-21 DIAGNOSIS — D500 Iron deficiency anemia secondary to blood loss (chronic): Secondary | ICD-10-CM | POA: Diagnosis not present

## 2023-07-21 DIAGNOSIS — R451 Restlessness and agitation: Secondary | ICD-10-CM | POA: Diagnosis not present

## 2023-07-21 DIAGNOSIS — I2510 Atherosclerotic heart disease of native coronary artery without angina pectoris: Secondary | ICD-10-CM | POA: Diagnosis not present

## 2023-07-21 DIAGNOSIS — K760 Fatty (change of) liver, not elsewhere classified: Secondary | ICD-10-CM | POA: Diagnosis not present

## 2023-07-21 DIAGNOSIS — E538 Deficiency of other specified B group vitamins: Secondary | ICD-10-CM | POA: Diagnosis not present

## 2023-07-21 DIAGNOSIS — I8390 Asymptomatic varicose veins of unspecified lower extremity: Secondary | ICD-10-CM | POA: Diagnosis not present

## 2023-07-21 DIAGNOSIS — N1831 Chronic kidney disease, stage 3a: Secondary | ICD-10-CM | POA: Diagnosis not present

## 2023-07-21 DIAGNOSIS — Z792 Long term (current) use of antibiotics: Secondary | ICD-10-CM | POA: Diagnosis not present

## 2023-07-21 DIAGNOSIS — Z96653 Presence of artificial knee joint, bilateral: Secondary | ICD-10-CM | POA: Diagnosis not present

## 2023-07-21 DIAGNOSIS — E559 Vitamin D deficiency, unspecified: Secondary | ICD-10-CM | POA: Diagnosis not present

## 2023-07-28 DIAGNOSIS — F02A4 Dementia in other diseases classified elsewhere, mild, with anxiety: Secondary | ICD-10-CM | POA: Diagnosis not present

## 2023-07-28 DIAGNOSIS — I509 Heart failure, unspecified: Secondary | ICD-10-CM | POA: Diagnosis not present

## 2023-07-28 DIAGNOSIS — I130 Hypertensive heart and chronic kidney disease with heart failure and stage 1 through stage 4 chronic kidney disease, or unspecified chronic kidney disease: Secondary | ICD-10-CM | POA: Diagnosis not present

## 2023-07-28 DIAGNOSIS — F325 Major depressive disorder, single episode, in full remission: Secondary | ICD-10-CM | POA: Diagnosis not present

## 2023-07-28 DIAGNOSIS — E039 Hypothyroidism, unspecified: Secondary | ICD-10-CM | POA: Diagnosis not present

## 2023-07-28 DIAGNOSIS — F02A3 Dementia in other diseases classified elsewhere, mild, with mood disturbance: Secondary | ICD-10-CM | POA: Diagnosis not present

## 2023-08-04 DIAGNOSIS — J029 Acute pharyngitis, unspecified: Secondary | ICD-10-CM | POA: Diagnosis not present

## 2023-08-04 DIAGNOSIS — J069 Acute upper respiratory infection, unspecified: Secondary | ICD-10-CM | POA: Diagnosis not present

## 2023-08-04 DIAGNOSIS — R058 Other specified cough: Secondary | ICD-10-CM | POA: Diagnosis not present

## 2023-08-04 DIAGNOSIS — Z1152 Encounter for screening for COVID-19: Secondary | ICD-10-CM | POA: Diagnosis not present

## 2023-08-04 DIAGNOSIS — R5383 Other fatigue: Secondary | ICD-10-CM | POA: Diagnosis not present

## 2023-08-04 DIAGNOSIS — R0981 Nasal congestion: Secondary | ICD-10-CM | POA: Diagnosis not present

## 2023-08-07 DIAGNOSIS — F325 Major depressive disorder, single episode, in full remission: Secondary | ICD-10-CM | POA: Diagnosis not present

## 2023-08-07 DIAGNOSIS — I509 Heart failure, unspecified: Secondary | ICD-10-CM | POA: Diagnosis not present

## 2023-08-07 DIAGNOSIS — I130 Hypertensive heart and chronic kidney disease with heart failure and stage 1 through stage 4 chronic kidney disease, or unspecified chronic kidney disease: Secondary | ICD-10-CM | POA: Diagnosis not present

## 2023-08-07 DIAGNOSIS — F02A3 Dementia in other diseases classified elsewhere, mild, with mood disturbance: Secondary | ICD-10-CM | POA: Diagnosis not present

## 2023-08-07 DIAGNOSIS — E039 Hypothyroidism, unspecified: Secondary | ICD-10-CM | POA: Diagnosis not present

## 2023-08-07 DIAGNOSIS — F02A4 Dementia in other diseases classified elsewhere, mild, with anxiety: Secondary | ICD-10-CM | POA: Diagnosis not present

## 2023-08-08 DIAGNOSIS — F02B0 Dementia in other diseases classified elsewhere, moderate, without behavioral disturbance, psychotic disturbance, mood disturbance, and anxiety: Secondary | ICD-10-CM | POA: Diagnosis not present

## 2023-08-11 DIAGNOSIS — F02A3 Dementia in other diseases classified elsewhere, mild, with mood disturbance: Secondary | ICD-10-CM | POA: Diagnosis not present

## 2023-08-11 DIAGNOSIS — F325 Major depressive disorder, single episode, in full remission: Secondary | ICD-10-CM | POA: Diagnosis not present

## 2023-08-11 DIAGNOSIS — E039 Hypothyroidism, unspecified: Secondary | ICD-10-CM | POA: Diagnosis not present

## 2023-08-11 DIAGNOSIS — I509 Heart failure, unspecified: Secondary | ICD-10-CM | POA: Diagnosis not present

## 2023-08-11 DIAGNOSIS — I130 Hypertensive heart and chronic kidney disease with heart failure and stage 1 through stage 4 chronic kidney disease, or unspecified chronic kidney disease: Secondary | ICD-10-CM | POA: Diagnosis not present

## 2023-08-11 DIAGNOSIS — F02A4 Dementia in other diseases classified elsewhere, mild, with anxiety: Secondary | ICD-10-CM | POA: Diagnosis not present

## 2023-08-18 DIAGNOSIS — I509 Heart failure, unspecified: Secondary | ICD-10-CM | POA: Diagnosis not present

## 2023-08-18 DIAGNOSIS — I130 Hypertensive heart and chronic kidney disease with heart failure and stage 1 through stage 4 chronic kidney disease, or unspecified chronic kidney disease: Secondary | ICD-10-CM | POA: Diagnosis not present

## 2023-08-18 DIAGNOSIS — E039 Hypothyroidism, unspecified: Secondary | ICD-10-CM | POA: Diagnosis not present

## 2023-08-18 DIAGNOSIS — F325 Major depressive disorder, single episode, in full remission: Secondary | ICD-10-CM | POA: Diagnosis not present

## 2023-08-18 DIAGNOSIS — F02A3 Dementia in other diseases classified elsewhere, mild, with mood disturbance: Secondary | ICD-10-CM | POA: Diagnosis not present

## 2023-08-18 DIAGNOSIS — F02A4 Dementia in other diseases classified elsewhere, mild, with anxiety: Secondary | ICD-10-CM | POA: Diagnosis not present

## 2023-08-19 ENCOUNTER — Ambulatory Visit: Payer: Medicare Other

## 2023-08-19 DIAGNOSIS — I442 Atrioventricular block, complete: Secondary | ICD-10-CM | POA: Diagnosis not present

## 2023-08-20 DIAGNOSIS — Z792 Long term (current) use of antibiotics: Secondary | ICD-10-CM | POA: Diagnosis not present

## 2023-08-20 DIAGNOSIS — F02A3 Dementia in other diseases classified elsewhere, mild, with mood disturbance: Secondary | ICD-10-CM | POA: Diagnosis not present

## 2023-08-20 DIAGNOSIS — K760 Fatty (change of) liver, not elsewhere classified: Secondary | ICD-10-CM | POA: Diagnosis not present

## 2023-08-20 DIAGNOSIS — I2510 Atherosclerotic heart disease of native coronary artery without angina pectoris: Secondary | ICD-10-CM | POA: Diagnosis not present

## 2023-08-20 DIAGNOSIS — I8390 Asymptomatic varicose veins of unspecified lower extremity: Secondary | ICD-10-CM | POA: Diagnosis not present

## 2023-08-20 DIAGNOSIS — K219 Gastro-esophageal reflux disease without esophagitis: Secondary | ICD-10-CM | POA: Diagnosis not present

## 2023-08-20 DIAGNOSIS — D500 Iron deficiency anemia secondary to blood loss (chronic): Secondary | ICD-10-CM | POA: Diagnosis not present

## 2023-08-20 DIAGNOSIS — E785 Hyperlipidemia, unspecified: Secondary | ICD-10-CM | POA: Diagnosis not present

## 2023-08-20 DIAGNOSIS — D692 Other nonthrombocytopenic purpura: Secondary | ICD-10-CM | POA: Diagnosis not present

## 2023-08-20 DIAGNOSIS — F325 Major depressive disorder, single episode, in full remission: Secondary | ICD-10-CM | POA: Diagnosis not present

## 2023-08-20 DIAGNOSIS — I509 Heart failure, unspecified: Secondary | ICD-10-CM | POA: Diagnosis not present

## 2023-08-20 DIAGNOSIS — M1990 Unspecified osteoarthritis, unspecified site: Secondary | ICD-10-CM | POA: Diagnosis not present

## 2023-08-20 DIAGNOSIS — I700 Atherosclerosis of aorta: Secondary | ICD-10-CM | POA: Diagnosis not present

## 2023-08-20 DIAGNOSIS — F02A4 Dementia in other diseases classified elsewhere, mild, with anxiety: Secondary | ICD-10-CM | POA: Diagnosis not present

## 2023-08-20 DIAGNOSIS — E559 Vitamin D deficiency, unspecified: Secondary | ICD-10-CM | POA: Diagnosis not present

## 2023-08-20 DIAGNOSIS — E039 Hypothyroidism, unspecified: Secondary | ICD-10-CM | POA: Diagnosis not present

## 2023-08-20 DIAGNOSIS — E1122 Type 2 diabetes mellitus with diabetic chronic kidney disease: Secondary | ICD-10-CM | POA: Diagnosis not present

## 2023-08-20 DIAGNOSIS — N1831 Chronic kidney disease, stage 3a: Secondary | ICD-10-CM | POA: Diagnosis not present

## 2023-08-20 DIAGNOSIS — N390 Urinary tract infection, site not specified: Secondary | ICD-10-CM | POA: Diagnosis not present

## 2023-08-20 DIAGNOSIS — E538 Deficiency of other specified B group vitamins: Secondary | ICD-10-CM | POA: Diagnosis not present

## 2023-08-20 DIAGNOSIS — Z96653 Presence of artificial knee joint, bilateral: Secondary | ICD-10-CM | POA: Diagnosis not present

## 2023-08-20 DIAGNOSIS — Z951 Presence of aortocoronary bypass graft: Secondary | ICD-10-CM | POA: Diagnosis not present

## 2023-08-20 DIAGNOSIS — I130 Hypertensive heart and chronic kidney disease with heart failure and stage 1 through stage 4 chronic kidney disease, or unspecified chronic kidney disease: Secondary | ICD-10-CM | POA: Diagnosis not present

## 2023-08-20 DIAGNOSIS — H9190 Unspecified hearing loss, unspecified ear: Secondary | ICD-10-CM | POA: Diagnosis not present

## 2023-08-20 LAB — CUP PACEART REMOTE DEVICE CHECK
Battery Remaining Longevity: 13 mo
Battery Remaining Percentage: 12 %
Battery Voltage: 2.86 V
Brady Statistic AP VP Percent: 24 %
Brady Statistic AP VS Percent: 1 %
Brady Statistic AS VP Percent: 75 %
Brady Statistic AS VS Percent: 1 %
Brady Statistic RA Percent Paced: 23 %
Brady Statistic RV Percent Paced: 99 %
Date Time Interrogation Session: 20250812023124
Implantable Lead Connection Status: 753985
Implantable Lead Connection Status: 753985
Implantable Lead Implant Date: 20170803
Implantable Lead Implant Date: 20170803
Implantable Lead Location: 753859
Implantable Lead Location: 753860
Implantable Pulse Generator Implant Date: 20170803
Lead Channel Impedance Value: 360 Ohm
Lead Channel Impedance Value: 380 Ohm
Lead Channel Pacing Threshold Amplitude: 0.5 V
Lead Channel Pacing Threshold Amplitude: 0.75 V
Lead Channel Pacing Threshold Pulse Width: 0.5 ms
Lead Channel Pacing Threshold Pulse Width: 0.5 ms
Lead Channel Sensing Intrinsic Amplitude: 1.2 mV
Lead Channel Sensing Intrinsic Amplitude: 8.8 mV
Lead Channel Setting Pacing Amplitude: 1 V
Lead Channel Setting Pacing Amplitude: 2 V
Lead Channel Setting Pacing Pulse Width: 0.5 ms
Lead Channel Setting Sensing Sensitivity: 2 mV
Pulse Gen Model: 2272
Pulse Gen Serial Number: 7929478

## 2023-08-23 ENCOUNTER — Ambulatory Visit: Payer: Self-pay | Admitting: Cardiology

## 2023-08-25 DIAGNOSIS — I130 Hypertensive heart and chronic kidney disease with heart failure and stage 1 through stage 4 chronic kidney disease, or unspecified chronic kidney disease: Secondary | ICD-10-CM | POA: Diagnosis not present

## 2023-08-25 DIAGNOSIS — I509 Heart failure, unspecified: Secondary | ICD-10-CM | POA: Diagnosis not present

## 2023-08-25 DIAGNOSIS — F02A3 Dementia in other diseases classified elsewhere, mild, with mood disturbance: Secondary | ICD-10-CM | POA: Diagnosis not present

## 2023-08-25 DIAGNOSIS — F325 Major depressive disorder, single episode, in full remission: Secondary | ICD-10-CM | POA: Diagnosis not present

## 2023-08-25 DIAGNOSIS — E039 Hypothyroidism, unspecified: Secondary | ICD-10-CM | POA: Diagnosis not present

## 2023-08-25 DIAGNOSIS — F02A4 Dementia in other diseases classified elsewhere, mild, with anxiety: Secondary | ICD-10-CM | POA: Diagnosis not present

## 2023-09-01 DIAGNOSIS — I509 Heart failure, unspecified: Secondary | ICD-10-CM | POA: Diagnosis not present

## 2023-09-01 DIAGNOSIS — F02A3 Dementia in other diseases classified elsewhere, mild, with mood disturbance: Secondary | ICD-10-CM | POA: Diagnosis not present

## 2023-09-01 DIAGNOSIS — E039 Hypothyroidism, unspecified: Secondary | ICD-10-CM | POA: Diagnosis not present

## 2023-09-01 DIAGNOSIS — F02A4 Dementia in other diseases classified elsewhere, mild, with anxiety: Secondary | ICD-10-CM | POA: Diagnosis not present

## 2023-09-01 DIAGNOSIS — F325 Major depressive disorder, single episode, in full remission: Secondary | ICD-10-CM | POA: Diagnosis not present

## 2023-09-01 DIAGNOSIS — I130 Hypertensive heart and chronic kidney disease with heart failure and stage 1 through stage 4 chronic kidney disease, or unspecified chronic kidney disease: Secondary | ICD-10-CM | POA: Diagnosis not present

## 2023-09-08 DIAGNOSIS — F02B0 Dementia in other diseases classified elsewhere, moderate, without behavioral disturbance, psychotic disturbance, mood disturbance, and anxiety: Secondary | ICD-10-CM | POA: Diagnosis not present

## 2023-09-09 NOTE — Progress Notes (Signed)
 Cardiology Office Note    Date:  09/15/2023  ID:  Diana Wood, Diana Wood [birth year], MRN [id number] PCP:  Diana Rush, MD  Cardiologist:  Dr. Delford    Chief Complaint: None f/u CAD and AV block   History of Present Illness:   89 y.o. history of CABG 11/2006, HTN, HLD, Hypothyroidism, DVT, GERD and history of uterine cancer Last myovue normal 02/22/16  Developed bradycardia and had a MRI compatible  PPM placed by Dr Diana Wood August 2017 Diana Wood for thyroid  and cholesterol labs   Having some stress with her husband who seems to be more short tempered  No cardiac issues. Grand daughter Diana Wood 71 years old now   Seen by Dr Diana Wood normal PPM function by [facility] 11/20/21 reviewed  Having some balance issues and falling   She had right hemicolectomy with Dr Diana Wood October 2022  for invasive adenocarcinoma  Has lost a lot of weight since surgery Husband had PPM placed in October and suffered likely OOH MI, and CVA Follows with Dr Diana Wood Hb stable 11.5 Defers f/u colonoscopy recommended by Dr Diana Wood her Oncologist  Has home health every day and daughter lives 2 minutes from her   Seen in ED 06/10/23 with weakness and right sided abdominal pain CT with ? Enteritis. D/c home Not thought to be septic TSH 9.4 WBC elevated 17.8   Husband died in March 04, 2023. Has niece who looks after her as well has her daughter   Past Medical History:  Diagnosis Date   Anemia    iron  infusions   Arthritis    CAD (coronary artery disease)    a. s/p CABG 2008.   Chest pain    Deep vein thrombosis (HCC)    Diabetes mellitus    GERD (gastroesophageal reflux disease)    Hiatal hernia    Hypercholesterolemia    Hyperlipidemia    Hypertension    Hypothyroid    Obesity    Pleural effusion    Uterine cancer (HCC)     Past Surgical History:  Procedure Laterality Date   ABDOMINAL HYSTERECTOMY     bilateral feet surgery     CATARACT EXTRACTION, BILATERAL     CORONARY ARTERY BYPASS GRAFT     x 4-11'08-Dr.  Bartle(Diana Wood)   endoscopic vein harvesting from both thighs     Diana Fellers MD   EP IMPLANTABLE DEVICE N/A 08/10/2015   Procedure: Pacemaker Implant;  Surgeon: Diana Diana Inocencio, MD;  Location: [facility];  Service: Cardiovascular;  Laterality: N/A;   excision cyst debridement fusion distal interphalangeal joint ring finger  with Mini Acutrak screw 20 mm in length     EYE SURGERY     corneal transplant -right   hysterectomy  01/07/1998   dx of uterine cancer   JOINT REPLACEMENT     right knee replacement-Diana Wood   LAPAROSCOPIC RIGHT HEMI COLECTOMY Right 10/13/2020   Procedure: LAPAROSCOPIC RIGHT HEMI COLECTOMY;  Surgeon: Diana Wood HERO, MD;  Location: [facility];  Service: General;  Laterality: Right;   surgery on 2 fingers     r HAND   TOTAL KNEE ARTHROPLASTY Left 03/16/2012   Procedure: TOTAL KNEE ARTHROPLASTY;  Surgeon: Diana Wood Moan, MD;  Location: [facility];  Service: Orthopedics;  Laterality: Left;    Current Medications: Current Outpatient Medications  Medication Sig Dispense Refill   Accu-Chek Softclix Lancets lancets 1 each by Other route as needed.     amLODipine  (NORVASC ) 10 MG tablet Take 10 mg by mouth  daily.     amoxicillin (AMOXIL) 500 MG capsule SMARTSIG:4 Capsule(s) By Mouth     Cholecalciferol  (VITAMIN D ) 50 MCG (2000 UT) CAPS Take 2,000 Units by mouth daily.     fenofibrate  (TRICOR ) 145 MG tablet Take 145 mg by mouth daily after breakfast.     ferrous sulfate  325 (65 FE) MG tablet Take 325 mg by mouth daily. (Patient taking differently: Take 325 mg by mouth 3 (three) times a week.)     hydrALAZINE  (APRESOLINE ) 25 MG tablet Take 50 mg by mouth in the morning and at bedtime.     hydrochlorothiazide  (HYDRODIURIL ) 12.5 MG tablet Take 12.5 mg by mouth daily after breakfast.  (Patient taking differently: Take 12.5 mg by mouth 3 (three) times a week.)     levothyroxine  (SYNTHROID ) 125 MCG tablet Take 125 mcg by mouth daily before breakfast.     melatonin 5 MG  TABS 1 tablet in the evening Orally Once a day     memantine  (NAMENDA ) 5 MG tablet Take 5 mg by mouth 2 (two) times daily.     nitroGLYCERIN  (NITROSTAT ) 0.4 MG SL tablet Place 1 tablet (0.4 mg total) under the tongue every 5 (five) minutes as needed for chest pain. 25 tablet 1   Omega-3 Fatty Acids (FISH OIL PO) Take 1 tablet by mouth daily.     omeprazole  (PRILOSEC) 20 MG capsule Take 20 mg by mouth daily after breakfast.     REXULTI 0.5 MG TABS 0.5 mg once. Patient takes in the Morning     sertraline  (ZOLOFT ) 100 MG tablet Take 100 mg by mouth daily. (Patient taking differently: Take 150 mg by mouth daily.)     TRUE METRIX BLOOD GLUCOSE TEST test strip 1 each by Other route daily.     ondansetron  (ZOFRAN -ODT) 4 MG disintegrating tablet Take 1 tablet (4 mg total) by mouth every 8 (eight) hours as needed for nausea or vomiting. 20 tablet 0   prednisoLONE  acetate (PRED FORTE ) 1 % ophthalmic suspension Place 1 drop into both eyes every morning.     No current facility-administered medications for this visit.     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  Tobacco Use   Smoking status: Never   Smokeless tobacco: Never  Vaping Use   Vaping status: Never Used  Substance and Sexual Activity   Alcohol  use: No   Drug use: No   Sexual activity: Not Currently    Partners: Male    Comment: married  Other Topics Concern   Not on file  Social History Narrative   Not on file   Social Drivers of Health   Financial Resource Strain: Not on file  Food Insecurity: Not on file  Transportation Needs: Not on file  Physical Activity: Not on file  Stress: Not on file  Social Connections:  Not on file     Family History:  The patient's family history includes Blindness in her brother; Cancer in her mother; Diabetes in her mother; Hyperlipidemia in her daughter; Thyroid  disease in her daughter.   ROS:   Please see the history of present illness.  All other  systems are reviewed and otherwise negative.    PHYSICAL EXAM:   VS:  BP 120/60   Pulse 64   Ht 4' 11 (1.499 m)   Wt 130 lb (59 kg)   SpO2 98%   BMI 26.26 kg/m   BMI: Body mass index is 26.26 kg/m. Affect appropriate Healthy:  appears stated age HEENT: normal Neck Wood with no adenopathy JVP normal no bruits no thyromegaly Lungs clear with no wheezing and good diaphragmatic motion Heart:  S1/S2 no murmur, no rub, gallop or click PPM under left clavicle  PMI normal pacer under right clavicle  Abdomen: benighn, BS positve, no tenderness, no AAA no bruit.  No HSM or HJR post lab hemi colectomy  Distal pulses intact with no bruits No edema Neuro non-focal Skin warm and dry No muscular weakness   Wt Readings from Last 3 Encounters:  09/15/23 130 lb (59 kg)  06/09/23 131 lb (59.4 kg)  02/03/23 127 lb (57.6 kg)      Studies/Labs Reviewed:   EKG:   08/04/15 CHB narrow complex escape 02/14/16  P synch pacing LBBB  03/05/17 AV paced with LBBB morphology   Recent Labs: 06/09/2023: ALT 22; BUN 29; Creatinine, Ser 1.12; Hemoglobin 12.0; Platelets 344; Potassium 4.1; Sodium 138; TSH 9.430   Lipid Panel   Additional studies/ records that were reviewed today include: Summarized above.    ASSESSMENT & PLAN:   1. PPM:  V pacing 100% of time  f/u EP [place] St Jude device MRI compatible Diana need battery change out next year ? Seeing EP again in January 2. CAD s/p CABG - non ischemic myovue 02/22/16 EF 66%  New nitro called in  3. Chol:  Labs followed by Diana on fibrate myalgias with statins Defer to primary No recent labs in Epic 4. Thyroid :  On Synthroid   f/u labs with Diana 5. DM:  Discussed low carb  diet.  Target hemoglobin A1c is 6.5 or less.  Continue current medications. 6. Anxiety/Depression: on Zoloft  mood seems appropriate  7. Colon Cancer:  post right hemi colectomy f/u Dr Diana Wood  8. Weakness:  ER visit 06/09/23 ? Enteritis with elevated WBC UA negative 7 mm hypodensity in neck of pancreas. F/u primary Diana repeat CBC  CBC BMET  F/U in a year    Maude Emmer

## 2023-09-12 DIAGNOSIS — F02A4 Dementia in other diseases classified elsewhere, mild, with anxiety: Secondary | ICD-10-CM | POA: Diagnosis not present

## 2023-09-12 DIAGNOSIS — F02A3 Dementia in other diseases classified elsewhere, mild, with mood disturbance: Secondary | ICD-10-CM | POA: Diagnosis not present

## 2023-09-12 DIAGNOSIS — F325 Major depressive disorder, single episode, in full remission: Secondary | ICD-10-CM | POA: Diagnosis not present

## 2023-09-12 DIAGNOSIS — E039 Hypothyroidism, unspecified: Secondary | ICD-10-CM | POA: Diagnosis not present

## 2023-09-12 DIAGNOSIS — I130 Hypertensive heart and chronic kidney disease with heart failure and stage 1 through stage 4 chronic kidney disease, or unspecified chronic kidney disease: Secondary | ICD-10-CM | POA: Diagnosis not present

## 2023-09-12 DIAGNOSIS — I509 Heart failure, unspecified: Secondary | ICD-10-CM | POA: Diagnosis not present

## 2023-09-15 ENCOUNTER — Ambulatory Visit: Attending: Cardiovascular Disease | Admitting: Cardiovascular Disease

## 2023-09-15 ENCOUNTER — Ambulatory Visit: Payer: Self-pay | Admitting: Cardiovascular Disease

## 2023-09-15 ENCOUNTER — Other Ambulatory Visit: Payer: Self-pay

## 2023-09-15 ENCOUNTER — Encounter: Payer: Self-pay | Admitting: Cardiovascular Disease

## 2023-09-15 VITALS — BP 120/60 | HR 64 | Ht 59.0 in | Wt 130.0 lb

## 2023-09-15 DIAGNOSIS — Z950 Presence of cardiac pacemaker: Secondary | ICD-10-CM

## 2023-09-15 DIAGNOSIS — Z951 Presence of aortocoronary bypass graft: Secondary | ICD-10-CM | POA: Diagnosis not present

## 2023-09-15 DIAGNOSIS — I10 Essential (primary) hypertension: Secondary | ICD-10-CM

## 2023-09-15 LAB — CBC

## 2023-09-15 NOTE — Patient Instructions (Addendum)
 Medication Instructions:  Your physician recommends that you continue on your current medications as directed. Please refer to the Current Medication list given to you today.  *If you need a refill on your cardiac medications before your next appointment, please call your pharmacy*  Follow-Up: At [facility], you and your health needs are our priority.  As part of our continuing mission to provide you with exceptional heart care, our providers are all part of one team.  This team includes your primary Cardiologist (physician) and Advanced Practice Providers or APPs (Physician Assistants and Nurse Practitioners) who all work together to provide you with the care you need, when you need it.  Your next appointment:   1 year  Provider:   Maude Emmer, MD

## 2023-09-16 LAB — CBC
Hematocrit: 40 (ref 34.0–46.6)
Hemoglobin: 12.4 g/dL (ref 11.1–15.9)
MCH: 28.8 pg (ref 26.6–33.0)
MCHC: 31 g/dL — AB (ref 31.5–35.7)
MCV: 93 fL (ref 79–97)
Platelets: 269 x10E3/uL (ref 150–450)
RBC: 4.3 x10E6/uL (ref 3.77–5.28)
RDW: 15 (ref 11.7–15.4)
WBC: 10.5 x10E3/uL (ref 3.4–10.8)

## 2023-09-16 LAB — BASIC METABOLIC PANEL WITH GFR
BUN/Creatinine Ratio: 24 (ref 12–28)
BUN: 25 mg/dL (ref 8–27)
CO2: 22 mmol/L (ref 20–29)
Calcium: 9.8 mg/dL (ref 8.7–10.3)
Chloride: 103 mmol/L (ref 96–106)
Creatinine, Ser: 1.03 mg/dL — ABNORMAL HIGH (ref 0.57–1.00)
Glucose: 121 mg/dL — ABNORMAL HIGH (ref 70–99)
Potassium: 3.9 mmol/L (ref 3.5–5.2)
Sodium: 142 mmol/L (ref 134–144)
eGFR: 52 mL/min/1.73 — ABNORMAL LOW (ref >59)

## 2023-09-18 DIAGNOSIS — F325 Major depressive disorder, single episode, in full remission: Secondary | ICD-10-CM | POA: Diagnosis not present

## 2023-09-18 DIAGNOSIS — F02A4 Dementia in other diseases classified elsewhere, mild, with anxiety: Secondary | ICD-10-CM | POA: Diagnosis not present

## 2023-09-18 DIAGNOSIS — F02A3 Dementia in other diseases classified elsewhere, mild, with mood disturbance: Secondary | ICD-10-CM | POA: Diagnosis not present

## 2023-09-18 DIAGNOSIS — E039 Hypothyroidism, unspecified: Secondary | ICD-10-CM | POA: Diagnosis not present

## 2023-09-18 DIAGNOSIS — I509 Heart failure, unspecified: Secondary | ICD-10-CM | POA: Diagnosis not present

## 2023-09-18 DIAGNOSIS — I130 Hypertensive heart and chronic kidney disease with heart failure and stage 1 through stage 4 chronic kidney disease, or unspecified chronic kidney disease: Secondary | ICD-10-CM | POA: Diagnosis not present

## 2023-09-19 DIAGNOSIS — N1831 Chronic kidney disease, stage 3a: Secondary | ICD-10-CM | POA: Diagnosis not present

## 2023-09-19 DIAGNOSIS — D692 Other nonthrombocytopenic purpura: Secondary | ICD-10-CM | POA: Diagnosis not present

## 2023-09-19 DIAGNOSIS — I130 Hypertensive heart and chronic kidney disease with heart failure and stage 1 through stage 4 chronic kidney disease, or unspecified chronic kidney disease: Secondary | ICD-10-CM | POA: Diagnosis not present

## 2023-09-19 DIAGNOSIS — I700 Atherosclerosis of aorta: Secondary | ICD-10-CM | POA: Diagnosis not present

## 2023-09-19 DIAGNOSIS — F325 Major depressive disorder, single episode, in full remission: Secondary | ICD-10-CM | POA: Diagnosis not present

## 2023-09-19 DIAGNOSIS — H9190 Unspecified hearing loss, unspecified ear: Secondary | ICD-10-CM | POA: Diagnosis not present

## 2023-09-19 DIAGNOSIS — E1122 Type 2 diabetes mellitus with diabetic chronic kidney disease: Secondary | ICD-10-CM | POA: Diagnosis not present

## 2023-09-19 DIAGNOSIS — I8390 Asymptomatic varicose veins of unspecified lower extremity: Secondary | ICD-10-CM | POA: Diagnosis not present

## 2023-09-19 DIAGNOSIS — M1990 Unspecified osteoarthritis, unspecified site: Secondary | ICD-10-CM | POA: Diagnosis not present

## 2023-09-19 DIAGNOSIS — E785 Hyperlipidemia, unspecified: Secondary | ICD-10-CM | POA: Diagnosis not present

## 2023-09-19 DIAGNOSIS — N390 Urinary tract infection, site not specified: Secondary | ICD-10-CM | POA: Diagnosis not present

## 2023-09-19 DIAGNOSIS — D500 Iron deficiency anemia secondary to blood loss (chronic): Secondary | ICD-10-CM | POA: Diagnosis not present

## 2023-09-19 DIAGNOSIS — F02A4 Dementia in other diseases classified elsewhere, mild, with anxiety: Secondary | ICD-10-CM | POA: Diagnosis not present

## 2023-09-19 DIAGNOSIS — Z96653 Presence of artificial knee joint, bilateral: Secondary | ICD-10-CM | POA: Diagnosis not present

## 2023-09-19 DIAGNOSIS — E039 Hypothyroidism, unspecified: Secondary | ICD-10-CM | POA: Diagnosis not present

## 2023-09-19 DIAGNOSIS — K219 Gastro-esophageal reflux disease without esophagitis: Secondary | ICD-10-CM | POA: Diagnosis not present

## 2023-09-19 DIAGNOSIS — Z951 Presence of aortocoronary bypass graft: Secondary | ICD-10-CM | POA: Diagnosis not present

## 2023-09-19 DIAGNOSIS — I2510 Atherosclerotic heart disease of native coronary artery without angina pectoris: Secondary | ICD-10-CM | POA: Diagnosis not present

## 2023-09-19 DIAGNOSIS — E538 Deficiency of other specified B group vitamins: Secondary | ICD-10-CM | POA: Diagnosis not present

## 2023-09-19 DIAGNOSIS — F02A3 Dementia in other diseases classified elsewhere, mild, with mood disturbance: Secondary | ICD-10-CM | POA: Diagnosis not present

## 2023-09-19 DIAGNOSIS — Z792 Long term (current) use of antibiotics: Secondary | ICD-10-CM | POA: Diagnosis not present

## 2023-09-19 DIAGNOSIS — E559 Vitamin D deficiency, unspecified: Secondary | ICD-10-CM | POA: Diagnosis not present

## 2023-09-19 DIAGNOSIS — K760 Fatty (change of) liver, not elsewhere classified: Secondary | ICD-10-CM | POA: Diagnosis not present

## 2023-09-19 DIAGNOSIS — I509 Heart failure, unspecified: Secondary | ICD-10-CM | POA: Diagnosis not present

## 2023-09-23 DIAGNOSIS — I2510 Atherosclerotic heart disease of native coronary artery without angina pectoris: Secondary | ICD-10-CM | POA: Diagnosis not present

## 2023-09-23 DIAGNOSIS — I700 Atherosclerosis of aorta: Secondary | ICD-10-CM | POA: Diagnosis not present

## 2023-09-23 DIAGNOSIS — I130 Hypertensive heart and chronic kidney disease with heart failure and stage 1 through stage 4 chronic kidney disease, or unspecified chronic kidney disease: Secondary | ICD-10-CM | POA: Diagnosis not present

## 2023-09-23 DIAGNOSIS — I509 Heart failure, unspecified: Secondary | ICD-10-CM | POA: Diagnosis not present

## 2023-09-23 DIAGNOSIS — I8390 Asymptomatic varicose veins of unspecified lower extremity: Secondary | ICD-10-CM | POA: Diagnosis not present

## 2023-09-23 DIAGNOSIS — E039 Hypothyroidism, unspecified: Secondary | ICD-10-CM | POA: Diagnosis not present

## 2023-09-23 DIAGNOSIS — N390 Urinary tract infection, site not specified: Secondary | ICD-10-CM | POA: Diagnosis not present

## 2023-09-23 DIAGNOSIS — N1831 Chronic kidney disease, stage 3a: Secondary | ICD-10-CM | POA: Diagnosis not present

## 2023-09-23 DIAGNOSIS — F325 Major depressive disorder, single episode, in full remission: Secondary | ICD-10-CM | POA: Diagnosis not present

## 2023-09-23 DIAGNOSIS — F02A4 Dementia in other diseases classified elsewhere, mild, with anxiety: Secondary | ICD-10-CM | POA: Diagnosis not present

## 2023-09-23 DIAGNOSIS — E1122 Type 2 diabetes mellitus with diabetic chronic kidney disease: Secondary | ICD-10-CM | POA: Diagnosis not present

## 2023-09-23 DIAGNOSIS — F02A3 Dementia in other diseases classified elsewhere, mild, with mood disturbance: Secondary | ICD-10-CM | POA: Diagnosis not present

## 2023-09-24 DIAGNOSIS — F325 Major depressive disorder, single episode, in full remission: Secondary | ICD-10-CM | POA: Diagnosis not present

## 2023-09-24 DIAGNOSIS — F02A4 Dementia in other diseases classified elsewhere, mild, with anxiety: Secondary | ICD-10-CM | POA: Diagnosis not present

## 2023-09-24 DIAGNOSIS — I509 Heart failure, unspecified: Secondary | ICD-10-CM | POA: Diagnosis not present

## 2023-09-24 DIAGNOSIS — I130 Hypertensive heart and chronic kidney disease with heart failure and stage 1 through stage 4 chronic kidney disease, or unspecified chronic kidney disease: Secondary | ICD-10-CM | POA: Diagnosis not present

## 2023-09-24 DIAGNOSIS — F02A3 Dementia in other diseases classified elsewhere, mild, with mood disturbance: Secondary | ICD-10-CM | POA: Diagnosis not present

## 2023-09-24 DIAGNOSIS — E039 Hypothyroidism, unspecified: Secondary | ICD-10-CM | POA: Diagnosis not present

## 2023-09-25 ENCOUNTER — Encounter: Payer: Self-pay | Admitting: Emergency Medicine

## 2023-09-25 ENCOUNTER — Ambulatory Visit (INDEPENDENT_AMBULATORY_CARE_PROVIDER_SITE_OTHER)

## 2023-09-25 ENCOUNTER — Ambulatory Visit
Admission: EM | Admit: 2023-09-25 | Discharge: 2023-09-25 | Disposition: A | Discharge status: Home / Self Care | Attending: Family Medicine | Admitting: Family Medicine

## 2023-09-25 DIAGNOSIS — R627 Adult failure to thrive: Secondary | ICD-10-CM | POA: Insufficient documentation

## 2023-09-25 DIAGNOSIS — R052 Subacute cough: Secondary | ICD-10-CM | POA: Diagnosis not present

## 2023-09-25 DIAGNOSIS — D72829 Elevated white blood cell count, unspecified: Secondary | ICD-10-CM | POA: Insufficient documentation

## 2023-09-25 DIAGNOSIS — K529 Noninfective gastroenteritis and colitis, unspecified: Secondary | ICD-10-CM | POA: Insufficient documentation

## 2023-09-25 DIAGNOSIS — K8689 Other specified diseases of pancreas: Secondary | ICD-10-CM | POA: Insufficient documentation

## 2023-09-25 DIAGNOSIS — D72825 Bandemia: Secondary | ICD-10-CM | POA: Insufficient documentation

## 2023-09-25 DIAGNOSIS — R062 Wheezing: Secondary | ICD-10-CM

## 2023-09-25 DIAGNOSIS — R059 Cough, unspecified: Secondary | ICD-10-CM | POA: Diagnosis not present

## 2023-09-25 DIAGNOSIS — M25519 Pain in unspecified shoulder: Secondary | ICD-10-CM | POA: Insufficient documentation

## 2023-09-25 DIAGNOSIS — R634 Abnormal weight loss: Secondary | ICD-10-CM | POA: Insufficient documentation

## 2023-09-25 DIAGNOSIS — N1831 Chronic kidney disease, stage 3a: Secondary | ICD-10-CM | POA: Insufficient documentation

## 2023-09-25 DIAGNOSIS — E538 Deficiency of other specified B group vitamins: Secondary | ICD-10-CM | POA: Insufficient documentation

## 2023-09-25 DIAGNOSIS — Z515 Encounter for palliative care: Secondary | ICD-10-CM | POA: Insufficient documentation

## 2023-09-25 DIAGNOSIS — Z0000 Encounter for general adult medical examination without abnormal findings: Secondary | ICD-10-CM | POA: Insufficient documentation

## 2023-09-25 DIAGNOSIS — J9811 Atelectasis: Secondary | ICD-10-CM | POA: Diagnosis not present

## 2023-09-25 DIAGNOSIS — J90 Pleural effusion, not elsewhere classified: Secondary | ICD-10-CM | POA: Diagnosis not present

## 2023-09-25 DIAGNOSIS — F03A0 Unspecified dementia, mild, without behavioral disturbance, psychotic disturbance, mood disturbance, and anxiety: Secondary | ICD-10-CM | POA: Insufficient documentation

## 2023-09-25 DIAGNOSIS — R159 Full incontinence of feces: Secondary | ICD-10-CM | POA: Insufficient documentation

## 2023-09-25 DIAGNOSIS — N390 Urinary tract infection, site not specified: Secondary | ICD-10-CM | POA: Insufficient documentation

## 2023-09-25 DIAGNOSIS — R5381 Other malaise: Secondary | ICD-10-CM | POA: Insufficient documentation

## 2023-09-25 DIAGNOSIS — C186 Malignant neoplasm of descending colon: Secondary | ICD-10-CM | POA: Insufficient documentation

## 2023-09-25 MED ORDER — PREDNISONE 20 MG PO TABS: 40.0000 mg | ORAL_TABLET | Freq: Every day | ORAL | 0 refills | Status: AC

## 2023-09-25 MED ORDER — ALBUTEROL SULFATE (2.5 MG/3ML) 0.083% IN NEBU: 2.5000 mg | INHALATION_SOLUTION | RESPIRATORY_TRACT | 0 refills | Status: DC | PRN

## 2023-09-25 MED ORDER — BENZONATATE 100 MG PO CAPS: 100.0000 mg | ORAL_CAPSULE | Freq: Three times a day (TID) | ORAL | 0 refills | Status: DC | PRN

## 2023-09-25 NOTE — Discharge Instructions (Signed)
 There is an enlarging amount of fluid in the right lung on the chest x-ray.  I have printed the radiology report from the x-ray today.  Use albuterol  in the nebulizer every 4 hours as needed for shortness of breath or wheezing.  Take prednisone  20 mg--2 daily for 3 days  Take benzonatate  100 mg, 1 tab every 8 hours as needed for cough.  Please follow-up with primary care about these issues.

## 2023-09-25 NOTE — ED Provider Notes (Signed)
 [facility]    CSN: [id number] Arrival date & time: 09/25/23  1554      History   Chief Complaint Chief Complaint  Patient presents with   chest congestion   Cough    HPI Diana Wood is a 89 y.o. female.    Cough Here for cough and chest congestion.  Has been going on for about 6 weeks or maybe more.  No fever at any point.  She was seen by her primary care and ended up taking doxycycline in early August.  Chest x-ray at that time maybe showed some scarring on her right lung base or elevated right hemidiaphragm.  No vomiting but she has had some diarrhea, 3-4 loose stools daily.  No known history of asthma and she has never smoked.  Her family has heard whistling and wheezing when she is struggling to breathe sometimes at night.  She is allergic to ACE inhibitors and omeprazole  and statins and tramadol  and losartan .  Past medical history significant for dementia and diabetes.  The diabetes is well-controlled.  Past Medical History:  Diagnosis Date   Anemia    iron  infusions   Arthritis    CAD (coronary artery disease)    a. s/p CABG 2008.   Chest pain    Deep vein thrombosis (HCC)    Diabetes mellitus    GERD (gastroesophageal reflux disease)    Hiatal hernia    Hypercholesterolemia    Hyperlipidemia    Hypertension    Hypothyroid    Obesity    Pleural effusion    Uterine cancer [facility])     Patient Active Problem List   Diagnosis Date Noted   Adult failure to thrive syndrome 09/25/2023   Enteritis 09/25/2023   Incontinence of feces 09/25/2023   Increased band cell count 09/25/2023   Leukocytosis 09/25/2023   Malignant tumor of descending colon (HCC) 09/25/2023   Mild dementia (HCC) 09/25/2023   Pancreatic mass 09/25/2023   Physical deconditioning 09/25/2023   Palliative care patient 09/25/2023   Preventative health care 09/25/2023   Shoulder joint pain 09/25/2023   Stage 3a chronic kidney disease (HCC) 09/25/2023   Urinary tract  infectious disease 09/25/2023   Vitamin B12 deficiency (non anemic) 09/25/2023   Weight loss 09/25/2023   Adenocarcinoma of colon (HCC) 10/22/2020   S/P right hemicolectomy 10/13/2020   History of excision of intestinal structure 10/13/2020   Iron  deficiency anemia due to chronic blood loss 08/16/2020   Lumbar spondylosis 06/27/2019   Abnormal gait 02/15/2019   History of fall 02/15/2019   Sacroiliac joint pain 11/27/2017   Degeneration of lumbar intervertebral disc 10/09/2017   Malignant neoplasm of uterus (HCC) 09/23/2017   Myocardial infarction (HCC) 09/23/2017   Hypokalemia 01/14/2017   Low back pain 01/01/2017   Hypercalcemia 07/05/2016   Cardiac pacemaker in situ 10/24/2015   Complete heart block (HCC) 08/10/2015   Complete AV block (HCC)    Heart block 08/04/2015   Non-thrombocytopenic purpura (HCC) 06/25/2015   Other seborrheic keratosis 06/25/2015   Varicose veins of lower extremity 06/25/2015   Changes in skin texture 06/23/2015   Major depressive disorder, single episode, in full remission (HCC) 06/23/2015   Hypertensive heart disease without congestive heart failure 09/15/2014   Cardiomegaly 05/12/2014   Encounter for screening for other disorder 01/18/2014   Vitamin D  deficiency 05/06/2013   Pain of right shoulder region 01/28/2013   Iron  deficiency anemia 12/07/2012   Postop Hyponatremia 03/17/2012   Postoperative anemia due to acute  blood loss 03/17/2012   Hypo-osmolality and hyponatremia 03/17/2012   Acute posthemorrhagic anemia 03/17/2012   Fatty (change of) liver, not elsewhere classified 03/10/2012   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   Corneal transplant status 08/06/2011   Not up to date with scheduled immunizations 10/13/2009   Hearing loss 09/06/2009   Bronchitis 01/11/2009   DEGENERATIVE JOINT DISEASE, BOTH KNEES, SEVERE 01/11/2009   COUGH 01/11/2009   Osteoarthritis of knee 01/11/2009   Fatigue 12/20/2008   Osteoarthritis 12/20/2008    Disorder of bone 07/15/2008   Hypothyroidism 07/02/2008   Type 2 diabetes mellitus without complications (HCC) 07/02/2008   Hypercholesterolemia 07/02/2008   Hyperlipidemia 07/02/2008   Obesity 07/02/2008   Anemia 07/02/2008   Hypertensive disorder 07/02/2008   Atherosclerotic heart disease of native coronary artery without angina pectoris 07/02/2008   Pleural effusion 07/02/2008   Gastro-esophageal reflux disease without esophagitis 07/02/2008   Diaphragmatic hernia 07/02/2008   CHEST PAIN 07/02/2008   History of primary malignant neoplasm of female genital organ 07/02/2008    Past Surgical History:  Procedure Laterality Date   ABDOMINAL HYSTERECTOMY     bilateral feet surgery     CATARACT EXTRACTION, BILATERAL     CORONARY ARTERY BYPASS GRAFT     x 4-11'08-Dr. Bartle([facility])   endoscopic vein harvesting from both thighs     Dorise Fellers MD   EP IMPLANTABLE DEVICE N/A 08/10/2015   Procedure: Pacemaker Implant;  Surgeon: Will Gladis Norton, MD;  Location: [facility];  Service: Cardiovascular;  Laterality: N/A;   excision cyst debridement fusion distal interphalangeal joint ring finger  with Mini Acutrak screw 20 mm in length     EYE SURGERY     corneal transplant -right   hysterectomy  01/07/1998   dx of uterine cancer   JOINT REPLACEMENT     right knee replacement-Dr. supple   LAPAROSCOPIC RIGHT HEMI COLECTOMY Right 10/13/2020   Procedure: LAPAROSCOPIC RIGHT HEMI COLECTOMY;  Surgeon: Teresa Lonni HERO, MD;  Location: [facility];  Service: General;  Laterality: Right;   surgery on 2 fingers     r HAND   TOTAL KNEE ARTHROPLASTY Left 03/16/2012   Procedure: TOTAL KNEE ARTHROPLASTY;  Surgeon: Dempsey LULLA Moan, MD;  Location: [facility];  Service: Orthopedics;  Laterality: Left;    OB History   No obstetric history on file.      Home Medications    Prior to Admission medications   Medication Sig Start Date End Date Taking? Authorizing Provider  Accu-Chek Softclix  Lancets lancets 1 each by Other route as needed. 03/27/19  Yes [provider]  albuterol  (PROVENTIL ) (2.5 MG/3ML) 0.083% nebulizer solution Take 3 mLs (2.5 mg total) by nebulization every 4 (four) hours as needed for wheezing or shortness of breath. 09/25/23  Yes Vonna Sharlet POUR, MD  amLODipine  (NORVASC ) 10 MG tablet Take 10 mg by mouth daily.   Yes [provider]  benzonatate  (TESSALON ) 100 MG capsule Take 1 capsule (100 mg total) by mouth 3 (three) times daily as needed for cough. 09/25/23  Yes Vonna Sharlet POUR, MD  Cholecalciferol  (VITAMIN D ) 50 MCG (2000 UT) CAPS Take 2,000 Units by mouth daily.   Yes [provider]  fenofibrate  (TRICOR ) 145 MG tablet Take 145 mg by mouth daily after breakfast.   Yes [provider]  ferrous sulfate  325 (65 FE) MG tablet Take 325 mg by mouth daily. Patient taking differently: Take 325 mg by mouth 3 (three) times a week. 03/26/18  Yes [provider]  hydrALAZINE  (APRESOLINE ) 25 MG tablet Take 50 mg by mouth in the morning and at bedtime.   Yes [provider]  hydrochlorothiazide  (HYDRODIURIL ) 12.5 MG tablet Take 12.5 mg by mouth daily after breakfast.  Patient taking differently: Take 12.5 mg by mouth 3 (three) times a week.   Yes [provider]  levothyroxine  (SYNTHROID ) 125 MCG tablet Take 125 mcg by mouth daily before breakfast. 04/07/20  Yes [provider]  melatonin 5 MG TABS 1 tablet in the evening Orally Once a day   Yes [provider]  memantine  (NAMENDA ) 5 MG tablet Take 5 mg by mouth 2 (two) times daily. 12/15/22  Yes [provider]  Omega-3 Fatty Acids (FISH OIL PO) Take 1 tablet by mouth daily.   Yes [provider]  omeprazole  (PRILOSEC) 20 MG capsule Take 20 mg by mouth daily after breakfast.   Yes [provider]  predniSONE  (DELTASONE ) 20 MG tablet Take 2 tablets (40 mg total) by mouth daily with breakfast for 3 days. 09/25/23 09/28/23  Yes Clema Skousen K, MD  REXULTI 0.5 MG TABS 0.5 mg once. Patient takes in the Morning 12/03/22  Yes [provider]  sertraline  (ZOLOFT ) 100 MG tablet Take 100 mg by mouth daily. Patient taking differently: Take 150 mg by mouth daily. 06/09/20  Yes [provider]  TRUE METRIX BLOOD GLUCOSE TEST test strip 1 each by Other route daily. 03/27/19  Yes [provider]  nitroGLYCERIN  (NITROSTAT ) 0.4 MG SL tablet Place 1 tablet (0.4 mg total) under the tongue every 5 (five) minutes as needed for chest pain. 02/03/23   Delford Maude BROCKS, MD    Family History Family History  Problem Relation Age of Onset   Cancer Mother        type unknown   Diabetes Mother    Blindness Brother        partial from menigitis   Hyperlipidemia Daughter    Thyroid  disease Daughter    Colon cancer Neg Hx     Social History Social History   Tobacco Use   Smoking status: Never   Smokeless tobacco: Never  Vaping Use   Vaping status: Never Used  Substance Use Topics   Alcohol  use: No   Drug use: No     Allergies   Ace inhibitors, Omeprazole , Statins, Tramadol  hcl, Wound dressing adhesive, Losartan , and Tape   Review of Systems Review of Systems  Respiratory:  Positive for cough.      Physical Exam Triage Vital Signs ED Triage Vitals  Encounter Vitals Group     BP 09/25/23 1612 133/83     Girls Systolic BP Percentile --      Girls Diastolic BP Percentile --      Boys Systolic BP Percentile --      Boys Diastolic BP Percentile --      Pulse Rate 09/25/23 1612 74     Resp 09/25/23 1612 18     Temp 09/25/23 1612 97.8 F (36.6 C)     Temp Source 09/25/23 1612 Oral     SpO2 09/25/23 1612 93 %     Weight --      Height --      Head Circumference --      Peak Flow --      Pain Score 09/25/23 1613 0     Pain Loc --      Pain Education --      Exclude from Growth Chart --  No data found.  Updated Vital Signs BP 133/83 (BP Location: Left Arm)   Pulse 74   Temp  97.8 F (36.6 C) (Oral)   Resp 18   SpO2 93%   Visual Acuity Right Eye Distance:   Left Eye Distance:   Bilateral Distance:    Right Eye Near:   Left Eye Near:    Bilateral Near:     Physical Exam Vitals reviewed.  Constitutional:      General: She is not in acute distress.    Appearance: She is not ill-appearing, toxic-appearing or diaphoretic.  HENT:     Nose: Nose normal.     Mouth/Throat:     Mouth: Mucous membranes are moist.     Pharynx: No oropharyngeal exudate or posterior oropharyngeal erythema.  Eyes:     Extraocular Movements: Extraocular movements intact.     Conjunctiva/sclera: Conjunctivae normal.     Pupils: Pupils are equal, round, and reactive to light.  Cardiovascular:     Rate and Rhythm: Normal rate and regular rhythm.     Heart sounds: No murmur heard. Pulmonary:     Effort: No respiratory distress.     Breath sounds: No stridor. No rhonchi or rales.     Comments: Air movement is good here in the clinic, but there are a couple of times that here in the end expiratory wheeze. Musculoskeletal:     Cervical back: Neck supple.  Lymphadenopathy:     Cervical: No cervical adenopathy.  Skin:    Capillary Refill: Capillary refill takes less than 2 seconds.     Coloration: Skin is not jaundiced or pale.  Neurological:     General: No focal deficit present.     Mental Status: She is alert and oriented to person, place, and time.  Psychiatric:        Behavior: Behavior normal.      UC Treatments / Results  Labs (all labs ordered are listed, but only abnormal results are displayed) Labs Reviewed - No data to display  EKG   Radiology DG Chest 2 View Result Date: 09/25/2023 CLINICAL DATA:  Cough and wheezing. EXAM: DG CHEST 2V COMPARISON:  June 09, 2023 FINDINGS: Development of a moderate right pleural effusion with right basilar airspace disease, likely atelectasis. No pneumothorax. Mild cardiomegaly. Right chest pacemaker with leads terminating in  the right atrium and right ventricle. Sternotomy wires. Tortuous aorta with aortic atherosclerosis. No acute fracture or destructive lesions. Multilevel thoracic osteophytosis. IMPRESSION: Interval development of a moderate right pleural effusion with right basilar atelectasis. Electronically Signed   By: Rogelia Myers M.D.   On: 09/25/2023 16:55    Procedures Procedures (including critical care time)  Medications Ordered in UC Medications - No data to display  Initial Impression / Assessment and Plan / UC Course  I have reviewed the triage vital signs and the nursing notes.  Pertinent labs & imaging results that were available during my care of the patient were reviewed by me and considered in my medical decision making (see chart for details).     Above results discussed with the patient's family and I showed them the images of her current chest x-ray which shows an enlarging pleural effusion compared to prior x-rays we have in our system from June.  I have gone over the reading from the x-ray done in care everywhere from early August which does seem to describe a very small effusion at the most.  Nebulizer is dispensed here and some albuterol  for  nebulizer sent to pharmacy along with 3 days of prednisone .  Since she is having some wheezing we will see if that gives her any symptom relief.  The family will call her primary care and get her set up with them so they can decide what evaluation if any needs to be done to assess her enlarging pleural effusion.  I discussed with family that it can be difficult to decide how much to do or not to do when someone is elderly Final Clinical Impressions(s) / UC Diagnoses   Final diagnoses:  Subacute cough  Wheezing     Discharge Instructions      There is an enlarging amount of fluid in the right lung on the chest x-ray.  I have printed the radiology report from the x-ray today.  Use albuterol  in the nebulizer every 4 hours as needed for  shortness of breath or wheezing.  Take prednisone  20 mg--2 daily for 3 days  Take benzonatate  100 mg, 1 tab every 8 hours as needed for cough.  Please follow-up with primary care about these issues.      ED Prescriptions     Medication Sig Dispense Auth. Provider   albuterol  (PROVENTIL ) (2.5 MG/3ML) 0.083% nebulizer solution Take 3 mLs (2.5 mg total) by nebulization every 4 (four) hours as needed for wheezing or shortness of breath. 225 mL Vonna Sharlet POUR, MD   predniSONE  (DELTASONE ) 20 MG tablet Take 2 tablets (40 mg total) by mouth daily with breakfast for 3 days. 6 tablet Vonna Sharlet POUR, MD   benzonatate  (TESSALON ) 100 MG capsule Take 1 capsule (100 mg total) by mouth 3 (three) times daily as needed for cough. 21 capsule Keinan Brouillet K, MD      PDMP not reviewed this encounter.   Vonna Sharlet POUR, MD 09/25/23 [phone]

## 2023-09-25 NOTE — ED Triage Notes (Addendum)
 Pt here with family who report chest congestion and productive cough (pt unable to clear anything with cough) x6 weeks. Has seen primary care [place]), prescribed antibiotic, and finished a pack of mucinex (recommended by PCP when cough continued after antibiotic) with no relief from symptoms. Chest x-ray was completed by 8/11. Family believes she has hx of chronic pleural effusion. Family feels that cough has gotten tighter. Pulse oximetry 92-93% in triage. Regular, unlabored breathing.

## 2023-09-29 DIAGNOSIS — K8689 Other specified diseases of pancreas: Secondary | ICD-10-CM | POA: Diagnosis not present

## 2023-09-29 DIAGNOSIS — I119 Hypertensive heart disease without heart failure: Secondary | ICD-10-CM | POA: Diagnosis not present

## 2023-09-29 DIAGNOSIS — N1831 Chronic kidney disease, stage 3a: Secondary | ICD-10-CM | POA: Diagnosis not present

## 2023-09-29 DIAGNOSIS — J90 Pleural effusion, not elsewhere classified: Secondary | ICD-10-CM | POA: Diagnosis not present

## 2023-09-29 DIAGNOSIS — R0602 Shortness of breath: Secondary | ICD-10-CM | POA: Diagnosis not present

## 2023-09-29 DIAGNOSIS — Z950 Presence of cardiac pacemaker: Secondary | ICD-10-CM | POA: Diagnosis not present

## 2023-09-29 DIAGNOSIS — I2510 Atherosclerotic heart disease of native coronary artery without angina pectoris: Secondary | ICD-10-CM | POA: Diagnosis not present

## 2023-09-29 DIAGNOSIS — Z515 Encounter for palliative care: Secondary | ICD-10-CM | POA: Diagnosis not present

## 2023-09-29 DIAGNOSIS — R627 Adult failure to thrive: Secondary | ICD-10-CM | POA: Diagnosis not present

## 2023-09-29 DIAGNOSIS — R5381 Other malaise: Secondary | ICD-10-CM | POA: Diagnosis not present

## 2023-09-29 DIAGNOSIS — F03A0 Unspecified dementia, mild, without behavioral disturbance, psychotic disturbance, mood disturbance, and anxiety: Secondary | ICD-10-CM | POA: Diagnosis not present

## 2023-10-02 DIAGNOSIS — F02A3 Dementia in other diseases classified elsewhere, mild, with mood disturbance: Secondary | ICD-10-CM | POA: Diagnosis not present

## 2023-10-02 DIAGNOSIS — E039 Hypothyroidism, unspecified: Secondary | ICD-10-CM | POA: Diagnosis not present

## 2023-10-02 DIAGNOSIS — I130 Hypertensive heart and chronic kidney disease with heart failure and stage 1 through stage 4 chronic kidney disease, or unspecified chronic kidney disease: Secondary | ICD-10-CM | POA: Diagnosis not present

## 2023-10-02 DIAGNOSIS — F325 Major depressive disorder, single episode, in full remission: Secondary | ICD-10-CM | POA: Diagnosis not present

## 2023-10-02 DIAGNOSIS — I509 Heart failure, unspecified: Secondary | ICD-10-CM | POA: Diagnosis not present

## 2023-10-02 DIAGNOSIS — F02A4 Dementia in other diseases classified elsewhere, mild, with anxiety: Secondary | ICD-10-CM | POA: Diagnosis not present

## 2023-10-02 NOTE — Progress Notes (Signed)
 Remote PPM Transmission

## 2023-10-07 ENCOUNTER — Other Ambulatory Visit (HOSPITAL_COMMUNITY): Payer: Self-pay | Admitting: Internal Medicine

## 2023-10-07 DIAGNOSIS — N1831 Chronic kidney disease, stage 3a: Secondary | ICD-10-CM | POA: Diagnosis not present

## 2023-10-07 DIAGNOSIS — F03A0 Unspecified dementia, mild, without behavioral disturbance, psychotic disturbance, mood disturbance, and anxiety: Secondary | ICD-10-CM | POA: Diagnosis not present

## 2023-10-07 DIAGNOSIS — K8689 Other specified diseases of pancreas: Secondary | ICD-10-CM | POA: Diagnosis not present

## 2023-10-07 DIAGNOSIS — I2510 Atherosclerotic heart disease of native coronary artery without angina pectoris: Secondary | ICD-10-CM | POA: Diagnosis not present

## 2023-10-07 DIAGNOSIS — Z515 Encounter for palliative care: Secondary | ICD-10-CM | POA: Diagnosis not present

## 2023-10-07 DIAGNOSIS — I119 Hypertensive heart disease without heart failure: Secondary | ICD-10-CM | POA: Diagnosis not present

## 2023-10-07 DIAGNOSIS — J90 Pleural effusion, not elsewhere classified: Secondary | ICD-10-CM

## 2023-10-07 DIAGNOSIS — R0602 Shortness of breath: Secondary | ICD-10-CM | POA: Diagnosis not present

## 2023-10-08 DIAGNOSIS — F02B0 Dementia in other diseases classified elsewhere, moderate, without behavioral disturbance, psychotic disturbance, mood disturbance, and anxiety: Secondary | ICD-10-CM | POA: Diagnosis not present

## 2023-10-08 DIAGNOSIS — F02A3 Dementia in other diseases classified elsewhere, mild, with mood disturbance: Secondary | ICD-10-CM | POA: Diagnosis not present

## 2023-10-08 DIAGNOSIS — I130 Hypertensive heart and chronic kidney disease with heart failure and stage 1 through stage 4 chronic kidney disease, or unspecified chronic kidney disease: Secondary | ICD-10-CM | POA: Diagnosis not present

## 2023-10-08 DIAGNOSIS — E039 Hypothyroidism, unspecified: Secondary | ICD-10-CM | POA: Diagnosis not present

## 2023-10-08 DIAGNOSIS — I509 Heart failure, unspecified: Secondary | ICD-10-CM | POA: Diagnosis not present

## 2023-10-08 DIAGNOSIS — F02A4 Dementia in other diseases classified elsewhere, mild, with anxiety: Secondary | ICD-10-CM | POA: Diagnosis not present

## 2023-10-08 DIAGNOSIS — F325 Major depressive disorder, single episode, in full remission: Secondary | ICD-10-CM | POA: Diagnosis not present

## 2023-10-09 ENCOUNTER — Other Ambulatory Visit: Payer: Self-pay | Admitting: Radiology

## 2023-10-09 ENCOUNTER — Ambulatory Visit (HOSPITAL_COMMUNITY)
Admission: RE | Admit: 2023-10-09 | Discharge: 2023-10-09 | Disposition: A | Discharge status: Home / Self Care | Source: Ambulatory Visit | Attending: Internal Medicine | Admitting: Internal Medicine

## 2023-10-09 DIAGNOSIS — I517 Cardiomegaly: Secondary | ICD-10-CM | POA: Diagnosis not present

## 2023-10-09 DIAGNOSIS — J90 Pleural effusion, not elsewhere classified: Secondary | ICD-10-CM

## 2023-10-09 LAB — BODY FLUID CELL COUNT WITH DIFFERENTIAL
Eos, Fluid: 0 %
Lymphs, Fluid: 26 %
Monocyte-Macrophage-Serous Fluid: 69 % (ref 50–90)
Neutrophil Count, Fluid: 5 % (ref 0–25)
Total Nucleated Cell Count, Fluid: 224 uL (ref 0–1000)

## 2023-10-09 LAB — PROTEIN, PLEURAL OR PERITONEAL FLUID: Total protein, fluid: <3 g/dL

## 2023-10-09 LAB — LACTATE DEHYDROGENASE, PLEURAL OR PERITONEAL FLUID: LD, Fluid: 51 U/L — ABNORMAL HIGH (ref 3–23)

## 2023-10-09 LAB — GLUCOSE, PLEURAL OR PERITONEAL FLUID: Glucose, Fluid: 121 mg/dL

## 2023-10-09 LAB — ALBUMIN, PLEURAL OR PERITONEAL FLUID: Albumin, Fluid: <1.5 g/dL

## 2023-10-09 MED ORDER — LIDOCAINE-EPINEPHRINE 1 %-1:100000 IJ SOLN
INTRAMUSCULAR | Status: AC
  Filled 2023-10-09: qty 1

## 2023-10-09 MED ORDER — LIDOCAINE-EPINEPHRINE 1 %-1:100000 IJ SOLN
15.0000 mL | Freq: Once | INTRAMUSCULAR | Status: AC
  Administered 2023-10-09: 15 mL via INTRADERMAL

## 2023-10-10 ENCOUNTER — Other Ambulatory Visit: Payer: Self-pay | Admitting: Internal Medicine

## 2023-10-10 DIAGNOSIS — Z85038 Personal history of other malignant neoplasm of large intestine: Secondary | ICD-10-CM

## 2023-10-10 DIAGNOSIS — J90 Pleural effusion, not elsewhere classified: Secondary | ICD-10-CM

## 2023-10-10 DIAGNOSIS — K8689 Other specified diseases of pancreas: Secondary | ICD-10-CM

## 2023-10-12 LAB — BODY FLUID CULTURE W GRAM STAIN
Culture: NO GROWTH
Gram Stain: NONE SEEN

## 2023-10-13 ENCOUNTER — Other Ambulatory Visit: Payer: Self-pay | Admitting: Internal Medicine

## 2023-10-13 DIAGNOSIS — J90 Pleural effusion, not elsewhere classified: Secondary | ICD-10-CM

## 2023-10-13 LAB — CYTOLOGY - NON PAP

## 2023-10-15 ENCOUNTER — Ambulatory Visit
Admission: RE | Admit: 2023-10-15 | Discharge: 2023-10-15 | Disposition: A | Discharge status: Home / Self Care | Source: Ambulatory Visit | Attending: Internal Medicine | Admitting: Internal Medicine

## 2023-10-15 ENCOUNTER — Inpatient Hospital Stay: Admission: RE | Admit: 2023-10-15 | Discharge: 2023-10-15 | Attending: Internal Medicine | Admitting: Internal Medicine

## 2023-10-15 DIAGNOSIS — J90 Pleural effusion, not elsewhere classified: Secondary | ICD-10-CM

## 2023-10-15 DIAGNOSIS — J9811 Atelectasis: Secondary | ICD-10-CM | POA: Diagnosis not present

## 2023-10-15 DIAGNOSIS — F02A3 Dementia in other diseases classified elsewhere, mild, with mood disturbance: Secondary | ICD-10-CM | POA: Diagnosis not present

## 2023-10-15 DIAGNOSIS — E039 Hypothyroidism, unspecified: Secondary | ICD-10-CM | POA: Diagnosis not present

## 2023-10-15 DIAGNOSIS — F02A4 Dementia in other diseases classified elsewhere, mild, with anxiety: Secondary | ICD-10-CM | POA: Diagnosis not present

## 2023-10-15 DIAGNOSIS — N281 Cyst of kidney, acquired: Secondary | ICD-10-CM | POA: Diagnosis not present

## 2023-10-15 DIAGNOSIS — R918 Other nonspecific abnormal finding of lung field: Secondary | ICD-10-CM | POA: Diagnosis not present

## 2023-10-15 DIAGNOSIS — Z85038 Personal history of other malignant neoplasm of large intestine: Secondary | ICD-10-CM

## 2023-10-15 DIAGNOSIS — F325 Major depressive disorder, single episode, in full remission: Secondary | ICD-10-CM | POA: Diagnosis not present

## 2023-10-15 DIAGNOSIS — I509 Heart failure, unspecified: Secondary | ICD-10-CM | POA: Diagnosis not present

## 2023-10-15 DIAGNOSIS — K8020 Calculus of gallbladder without cholecystitis without obstruction: Secondary | ICD-10-CM | POA: Diagnosis not present

## 2023-10-15 DIAGNOSIS — K8689 Other specified diseases of pancreas: Secondary | ICD-10-CM

## 2023-10-15 DIAGNOSIS — I130 Hypertensive heart and chronic kidney disease with heart failure and stage 1 through stage 4 chronic kidney disease, or unspecified chronic kidney disease: Secondary | ICD-10-CM | POA: Diagnosis not present

## 2023-10-16 DIAGNOSIS — I130 Hypertensive heart and chronic kidney disease with heart failure and stage 1 through stage 4 chronic kidney disease, or unspecified chronic kidney disease: Secondary | ICD-10-CM | POA: Diagnosis not present

## 2023-10-16 DIAGNOSIS — F325 Major depressive disorder, single episode, in full remission: Secondary | ICD-10-CM | POA: Diagnosis not present

## 2023-10-16 DIAGNOSIS — I509 Heart failure, unspecified: Secondary | ICD-10-CM | POA: Diagnosis not present

## 2023-10-16 DIAGNOSIS — F02A4 Dementia in other diseases classified elsewhere, mild, with anxiety: Secondary | ICD-10-CM | POA: Diagnosis not present

## 2023-10-16 DIAGNOSIS — E039 Hypothyroidism, unspecified: Secondary | ICD-10-CM | POA: Diagnosis not present

## 2023-10-16 DIAGNOSIS — F02A3 Dementia in other diseases classified elsewhere, mild, with mood disturbance: Secondary | ICD-10-CM | POA: Diagnosis not present

## 2023-10-19 DIAGNOSIS — K219 Gastro-esophageal reflux disease without esophagitis: Secondary | ICD-10-CM | POA: Diagnosis not present

## 2023-10-19 DIAGNOSIS — D692 Other nonthrombocytopenic purpura: Secondary | ICD-10-CM | POA: Diagnosis not present

## 2023-10-19 DIAGNOSIS — E039 Hypothyroidism, unspecified: Secondary | ICD-10-CM | POA: Diagnosis not present

## 2023-10-19 DIAGNOSIS — F325 Major depressive disorder, single episode, in full remission: Secondary | ICD-10-CM | POA: Diagnosis not present

## 2023-10-19 DIAGNOSIS — E559 Vitamin D deficiency, unspecified: Secondary | ICD-10-CM | POA: Diagnosis not present

## 2023-10-19 DIAGNOSIS — N390 Urinary tract infection, site not specified: Secondary | ICD-10-CM | POA: Diagnosis not present

## 2023-10-19 DIAGNOSIS — Z792 Long term (current) use of antibiotics: Secondary | ICD-10-CM | POA: Diagnosis not present

## 2023-10-19 DIAGNOSIS — H9190 Unspecified hearing loss, unspecified ear: Secondary | ICD-10-CM | POA: Diagnosis not present

## 2023-10-19 DIAGNOSIS — E538 Deficiency of other specified B group vitamins: Secondary | ICD-10-CM | POA: Diagnosis not present

## 2023-10-19 DIAGNOSIS — M1990 Unspecified osteoarthritis, unspecified site: Secondary | ICD-10-CM | POA: Diagnosis not present

## 2023-10-19 DIAGNOSIS — I2510 Atherosclerotic heart disease of native coronary artery without angina pectoris: Secondary | ICD-10-CM | POA: Diagnosis not present

## 2023-10-19 DIAGNOSIS — I8390 Asymptomatic varicose veins of unspecified lower extremity: Secondary | ICD-10-CM | POA: Diagnosis not present

## 2023-10-19 DIAGNOSIS — F02A3 Dementia in other diseases classified elsewhere, mild, with mood disturbance: Secondary | ICD-10-CM | POA: Diagnosis not present

## 2023-10-19 DIAGNOSIS — F02A4 Dementia in other diseases classified elsewhere, mild, with anxiety: Secondary | ICD-10-CM | POA: Diagnosis not present

## 2023-10-19 DIAGNOSIS — I509 Heart failure, unspecified: Secondary | ICD-10-CM | POA: Diagnosis not present

## 2023-10-19 DIAGNOSIS — Z951 Presence of aortocoronary bypass graft: Secondary | ICD-10-CM | POA: Diagnosis not present

## 2023-10-19 DIAGNOSIS — K760 Fatty (change of) liver, not elsewhere classified: Secondary | ICD-10-CM | POA: Diagnosis not present

## 2023-10-19 DIAGNOSIS — Z96653 Presence of artificial knee joint, bilateral: Secondary | ICD-10-CM | POA: Diagnosis not present

## 2023-10-19 DIAGNOSIS — D500 Iron deficiency anemia secondary to blood loss (chronic): Secondary | ICD-10-CM | POA: Diagnosis not present

## 2023-10-19 DIAGNOSIS — N1831 Chronic kidney disease, stage 3a: Secondary | ICD-10-CM | POA: Diagnosis not present

## 2023-10-19 DIAGNOSIS — I130 Hypertensive heart and chronic kidney disease with heart failure and stage 1 through stage 4 chronic kidney disease, or unspecified chronic kidney disease: Secondary | ICD-10-CM | POA: Diagnosis not present

## 2023-10-19 DIAGNOSIS — E1122 Type 2 diabetes mellitus with diabetic chronic kidney disease: Secondary | ICD-10-CM | POA: Diagnosis not present

## 2023-10-19 DIAGNOSIS — E785 Hyperlipidemia, unspecified: Secondary | ICD-10-CM | POA: Diagnosis not present

## 2023-10-19 DIAGNOSIS — I700 Atherosclerosis of aorta: Secondary | ICD-10-CM | POA: Diagnosis not present

## 2023-10-21 DIAGNOSIS — F02A4 Dementia in other diseases classified elsewhere, mild, with anxiety: Secondary | ICD-10-CM | POA: Diagnosis not present

## 2023-10-21 DIAGNOSIS — F02A3 Dementia in other diseases classified elsewhere, mild, with mood disturbance: Secondary | ICD-10-CM | POA: Diagnosis not present

## 2023-10-21 DIAGNOSIS — I509 Heart failure, unspecified: Secondary | ICD-10-CM | POA: Diagnosis not present

## 2023-10-21 DIAGNOSIS — I130 Hypertensive heart and chronic kidney disease with heart failure and stage 1 through stage 4 chronic kidney disease, or unspecified chronic kidney disease: Secondary | ICD-10-CM | POA: Diagnosis not present

## 2023-10-21 DIAGNOSIS — F325 Major depressive disorder, single episode, in full remission: Secondary | ICD-10-CM | POA: Diagnosis not present

## 2023-10-21 DIAGNOSIS — E039 Hypothyroidism, unspecified: Secondary | ICD-10-CM | POA: Diagnosis not present

## 2023-10-23 DIAGNOSIS — I440 Atrioventricular block, first degree: Secondary | ICD-10-CM | POA: Diagnosis not present

## 2023-10-23 DIAGNOSIS — J90 Pleural effusion, not elsewhere classified: Secondary | ICD-10-CM | POA: Diagnosis not present

## 2023-10-23 DIAGNOSIS — J9691 Respiratory failure, unspecified with hypoxia: Secondary | ICD-10-CM | POA: Diagnosis not present

## 2023-10-23 DIAGNOSIS — E119 Type 2 diabetes mellitus without complications: Secondary | ICD-10-CM | POA: Diagnosis not present

## 2023-10-23 DIAGNOSIS — E785 Hyperlipidemia, unspecified: Secondary | ICD-10-CM | POA: Diagnosis not present

## 2023-10-23 DIAGNOSIS — F5000 Anorexia nervosa, unspecified: Secondary | ICD-10-CM | POA: Diagnosis not present

## 2023-10-23 DIAGNOSIS — K2100 Gastro-esophageal reflux disease with esophagitis, without bleeding: Secondary | ICD-10-CM | POA: Diagnosis not present

## 2023-10-23 DIAGNOSIS — D649 Anemia, unspecified: Secondary | ICD-10-CM | POA: Diagnosis not present

## 2023-10-23 DIAGNOSIS — K8689 Other specified diseases of pancreas: Secondary | ICD-10-CM | POA: Diagnosis not present

## 2023-10-23 DIAGNOSIS — I110 Hypertensive heart disease with heart failure: Secondary | ICD-10-CM | POA: Diagnosis not present

## 2023-10-23 DIAGNOSIS — R0602 Shortness of breath: Secondary | ICD-10-CM | POA: Diagnosis not present

## 2023-10-23 DIAGNOSIS — N1831 Chronic kidney disease, stage 3a: Secondary | ICD-10-CM | POA: Diagnosis not present

## 2023-10-24 DIAGNOSIS — N1831 Chronic kidney disease, stage 3a: Secondary | ICD-10-CM | POA: Diagnosis not present

## 2023-10-24 DIAGNOSIS — I110 Hypertensive heart disease with heart failure: Secondary | ICD-10-CM | POA: Diagnosis not present

## 2023-10-24 DIAGNOSIS — K2100 Gastro-esophageal reflux disease with esophagitis, without bleeding: Secondary | ICD-10-CM | POA: Diagnosis not present

## 2023-10-24 DIAGNOSIS — K8689 Other specified diseases of pancreas: Secondary | ICD-10-CM | POA: Diagnosis not present

## 2023-10-24 DIAGNOSIS — J90 Pleural effusion, not elsewhere classified: Secondary | ICD-10-CM | POA: Diagnosis not present

## 2023-10-24 DIAGNOSIS — E119 Type 2 diabetes mellitus without complications: Secondary | ICD-10-CM | POA: Diagnosis not present

## 2023-10-25 DIAGNOSIS — E119 Type 2 diabetes mellitus without complications: Secondary | ICD-10-CM | POA: Diagnosis not present

## 2023-10-25 DIAGNOSIS — J90 Pleural effusion, not elsewhere classified: Secondary | ICD-10-CM | POA: Diagnosis not present

## 2023-10-25 DIAGNOSIS — K8689 Other specified diseases of pancreas: Secondary | ICD-10-CM | POA: Diagnosis not present

## 2023-10-25 DIAGNOSIS — K2100 Gastro-esophageal reflux disease with esophagitis, without bleeding: Secondary | ICD-10-CM | POA: Diagnosis not present

## 2023-10-25 DIAGNOSIS — N1831 Chronic kidney disease, stage 3a: Secondary | ICD-10-CM | POA: Diagnosis not present

## 2023-10-25 DIAGNOSIS — I110 Hypertensive heart disease with heart failure: Secondary | ICD-10-CM | POA: Diagnosis not present

## 2023-10-27 DIAGNOSIS — E039 Hypothyroidism, unspecified: Secondary | ICD-10-CM | POA: Diagnosis not present

## 2023-10-27 DIAGNOSIS — I1310 Hypertensive heart and chronic kidney disease without heart failure, with stage 1 through stage 4 chronic kidney disease, or unspecified chronic kidney disease: Secondary | ICD-10-CM | POA: Diagnosis not present

## 2023-10-27 DIAGNOSIS — F5000 Anorexia nervosa, unspecified: Secondary | ICD-10-CM | POA: Diagnosis not present

## 2023-10-27 DIAGNOSIS — J9691 Respiratory failure, unspecified with hypoxia: Secondary | ICD-10-CM | POA: Diagnosis not present

## 2023-10-27 DIAGNOSIS — K8689 Other specified diseases of pancreas: Secondary | ICD-10-CM | POA: Diagnosis not present

## 2023-10-27 DIAGNOSIS — I440 Atrioventricular block, first degree: Secondary | ICD-10-CM | POA: Diagnosis not present

## 2023-10-27 DIAGNOSIS — D649 Anemia, unspecified: Secondary | ICD-10-CM | POA: Diagnosis not present

## 2023-10-27 DIAGNOSIS — E785 Hyperlipidemia, unspecified: Secondary | ICD-10-CM | POA: Diagnosis not present

## 2023-10-27 DIAGNOSIS — K2100 Gastro-esophageal reflux disease with esophagitis, without bleeding: Secondary | ICD-10-CM | POA: Diagnosis not present

## 2023-10-27 DIAGNOSIS — N1831 Chronic kidney disease, stage 3a: Secondary | ICD-10-CM | POA: Diagnosis not present

## 2023-10-27 DIAGNOSIS — J90 Pleural effusion, not elsewhere classified: Secondary | ICD-10-CM | POA: Diagnosis not present

## 2023-10-27 DIAGNOSIS — E119 Type 2 diabetes mellitus without complications: Secondary | ICD-10-CM | POA: Diagnosis not present

## 2023-10-29 DIAGNOSIS — J90 Pleural effusion, not elsewhere classified: Secondary | ICD-10-CM | POA: Diagnosis not present

## 2023-10-29 DIAGNOSIS — K2100 Gastro-esophageal reflux disease with esophagitis, without bleeding: Secondary | ICD-10-CM | POA: Diagnosis not present

## 2023-10-29 DIAGNOSIS — N1831 Chronic kidney disease, stage 3a: Secondary | ICD-10-CM | POA: Diagnosis not present

## 2023-10-29 DIAGNOSIS — E119 Type 2 diabetes mellitus without complications: Secondary | ICD-10-CM | POA: Diagnosis not present

## 2023-10-29 DIAGNOSIS — I110 Hypertensive heart disease with heart failure: Secondary | ICD-10-CM | POA: Diagnosis not present

## 2023-10-29 DIAGNOSIS — K8689 Other specified diseases of pancreas: Secondary | ICD-10-CM | POA: Diagnosis not present

## 2023-10-30 DIAGNOSIS — N1831 Chronic kidney disease, stage 3a: Secondary | ICD-10-CM | POA: Diagnosis not present

## 2023-10-30 DIAGNOSIS — J90 Pleural effusion, not elsewhere classified: Secondary | ICD-10-CM | POA: Diagnosis not present

## 2023-10-30 DIAGNOSIS — K2100 Gastro-esophageal reflux disease with esophagitis, without bleeding: Secondary | ICD-10-CM | POA: Diagnosis not present

## 2023-10-30 DIAGNOSIS — I110 Hypertensive heart disease with heart failure: Secondary | ICD-10-CM | POA: Diagnosis not present

## 2023-10-30 DIAGNOSIS — E119 Type 2 diabetes mellitus without complications: Secondary | ICD-10-CM | POA: Diagnosis not present

## 2023-10-30 DIAGNOSIS — K8689 Other specified diseases of pancreas: Secondary | ICD-10-CM | POA: Diagnosis not present

## 2023-10-31 DIAGNOSIS — N1831 Chronic kidney disease, stage 3a: Secondary | ICD-10-CM | POA: Diagnosis not present

## 2023-10-31 DIAGNOSIS — E119 Type 2 diabetes mellitus without complications: Secondary | ICD-10-CM | POA: Diagnosis not present

## 2023-10-31 DIAGNOSIS — I110 Hypertensive heart disease with heart failure: Secondary | ICD-10-CM | POA: Diagnosis not present

## 2023-10-31 DIAGNOSIS — K2100 Gastro-esophageal reflux disease with esophagitis, without bleeding: Secondary | ICD-10-CM | POA: Diagnosis not present

## 2023-10-31 DIAGNOSIS — K8689 Other specified diseases of pancreas: Secondary | ICD-10-CM | POA: Diagnosis not present

## 2023-10-31 DIAGNOSIS — J90 Pleural effusion, not elsewhere classified: Secondary | ICD-10-CM | POA: Diagnosis not present

## 2023-11-01 DIAGNOSIS — K2100 Gastro-esophageal reflux disease with esophagitis, without bleeding: Secondary | ICD-10-CM | POA: Diagnosis not present

## 2023-11-01 DIAGNOSIS — N1831 Chronic kidney disease, stage 3a: Secondary | ICD-10-CM | POA: Diagnosis not present

## 2023-11-01 DIAGNOSIS — I110 Hypertensive heart disease with heart failure: Secondary | ICD-10-CM | POA: Diagnosis not present

## 2023-11-01 DIAGNOSIS — J90 Pleural effusion, not elsewhere classified: Secondary | ICD-10-CM | POA: Diagnosis not present

## 2023-11-01 DIAGNOSIS — K8689 Other specified diseases of pancreas: Secondary | ICD-10-CM | POA: Diagnosis not present

## 2023-11-01 DIAGNOSIS — E119 Type 2 diabetes mellitus without complications: Secondary | ICD-10-CM | POA: Diagnosis not present

## 2023-11-03 ENCOUNTER — Telehealth: Payer: Self-pay

## 2023-11-03 NOTE — Telephone Encounter (Signed)
 Spoke w/ patient daughter who states patient passed away 2023-11-27. Condolences given to daughter.   Will forward to provider for awareness as requested.

## 2023-11-08 NOTE — Procedures (Signed)
 Ultrasound-guided diagnostic and therapeutic right thoracentesis performed yielding 1.2 liters of hazy, yellow fluid. No immediate complications. Follow-up chest x-ray pending.  The fluid was submitted to the lab for preordered studies.EBL none.

## 2023-11-08 DEATH — deceased

## 2023-11-09 LAB — FUNGUS CULTURE WITH STAIN

## 2023-11-09 LAB — FUNGUS CULTURE RESULT

## 2023-11-09 LAB — FUNGAL ORGANISM REFLEX

## 2023-11-18 ENCOUNTER — Ambulatory Visit: Payer: Medicare Other

## 2024-02-17 ENCOUNTER — Ambulatory Visit: Payer: Medicare Other

## 2024-05-18 ENCOUNTER — Ambulatory Visit: Payer: Medicare Other
